# Patient Record
Sex: Female | Born: 1945
Health system: Southern US, Community
[De-identification: ages and names within clinical notes are randomized; demographics above are authoritative.]

## PROBLEM LIST (undated history)

## (undated) DIAGNOSIS — E039 Hypothyroidism, unspecified: Secondary | ICD-10-CM

## (undated) DIAGNOSIS — Z9221 Personal history of antineoplastic chemotherapy: Secondary | ICD-10-CM

## (undated) DIAGNOSIS — M1990 Unspecified osteoarthritis, unspecified site: Secondary | ICD-10-CM

## (undated) DIAGNOSIS — C801 Malignant (primary) neoplasm, unspecified: Secondary | ICD-10-CM

## (undated) DIAGNOSIS — E119 Type 2 diabetes mellitus without complications: Secondary | ICD-10-CM

## (undated) DIAGNOSIS — E1121 Type 2 diabetes mellitus with diabetic nephropathy: Secondary | ICD-10-CM

## (undated) DIAGNOSIS — R011 Cardiac murmur, unspecified: Secondary | ICD-10-CM

## (undated) DIAGNOSIS — J189 Pneumonia, unspecified organism: Secondary | ICD-10-CM

## (undated) DIAGNOSIS — R51 Headache: Secondary | ICD-10-CM

## (undated) DIAGNOSIS — Z923 Personal history of irradiation: Secondary | ICD-10-CM

## (undated) DIAGNOSIS — I10 Essential (primary) hypertension: Secondary | ICD-10-CM

## (undated) DIAGNOSIS — L821 Other seborrheic keratosis: Secondary | ICD-10-CM

## (undated) DIAGNOSIS — E559 Vitamin D deficiency, unspecified: Secondary | ICD-10-CM

## (undated) DIAGNOSIS — R519 Headache, unspecified: Secondary | ICD-10-CM

## (undated) HISTORY — PX: BREAST CYST ASPIRATION: SHX578

## (undated) HISTORY — PX: STENT PLACEMENT ILIAC (ARMC HX): HXRAD1735

## (undated) HISTORY — PX: EYE SURGERY: SHX253

## (undated) HISTORY — PX: CHOLECYSTECTOMY: SHX55

## (undated) HISTORY — PX: TUBAL LIGATION: SHX77

## (undated) HISTORY — PX: APPENDECTOMY: SHX54

---

## 2011-09-18 DIAGNOSIS — K760 Fatty (change of) liver, not elsewhere classified: Secondary | ICD-10-CM | POA: Insufficient documentation

## 2011-12-12 ENCOUNTER — Ambulatory Visit: Payer: Self-pay | Admitting: Internal Medicine

## 2011-12-30 ENCOUNTER — Ambulatory Visit: Payer: Self-pay | Admitting: Internal Medicine

## 2012-06-21 ENCOUNTER — Ambulatory Visit: Payer: Self-pay | Admitting: Internal Medicine

## 2012-07-16 ENCOUNTER — Inpatient Hospital Stay: Payer: Self-pay | Admitting: Internal Medicine

## 2012-07-16 LAB — COMPREHENSIVE METABOLIC PANEL
Albumin: 4.1 g/dL (ref 3.4–5.0)
Anion Gap: 8 (ref 7–16)
BUN: 11 mg/dL (ref 7–18)
Bilirubin,Total: 0.5 mg/dL (ref 0.2–1.0)
Chloride: 108 mmol/L — ABNORMAL HIGH (ref 98–107)
Co2: 23 mmol/L (ref 21–32)
Potassium: 3.8 mmol/L (ref 3.5–5.1)
SGOT(AST): 31 U/L (ref 15–37)
Sodium: 139 mmol/L (ref 136–145)
Total Protein: 7.3 g/dL (ref 6.4–8.2)

## 2012-07-16 LAB — URINALYSIS, COMPLETE
Bilirubin,UR: NEGATIVE
Blood: NEGATIVE
Leukocyte Esterase: NEGATIVE
Nitrite: NEGATIVE
RBC,UR: NONE SEEN /HPF (ref 0–5)
Squamous Epithelial: NONE SEEN
WBC UR: NONE SEEN /HPF (ref 0–5)

## 2012-07-16 LAB — CBC
HCT: 44.2 % (ref 35.0–47.0)
MCH: 31.3 pg (ref 26.0–34.0)
MCHC: 34.2 g/dL (ref 32.0–36.0)
MCV: 91 fL (ref 80–100)
RBC: 4.84 10*6/uL (ref 3.80–5.20)
RDW: 13.1 % (ref 11.5–14.5)
WBC: 6.1 10*3/uL (ref 3.6–11.0)

## 2012-07-16 LAB — TSH: Thyroid Stimulating Horm: 2.14 u[IU]/mL

## 2012-07-17 LAB — BASIC METABOLIC PANEL
Anion Gap: 4 — ABNORMAL LOW (ref 7–16)
Calcium, Total: 8.9 mg/dL (ref 8.5–10.1)
Co2: 29 mmol/L (ref 21–32)
Creatinine: 0.84 mg/dL (ref 0.60–1.30)
EGFR (African American): >60
EGFR (Non-African Amer.): >60
Glucose: 118 mg/dL — ABNORMAL HIGH (ref 65–99)
Sodium: 141 mmol/L (ref 136–145)

## 2012-07-17 LAB — LIPID PANEL
Cholesterol: 123 mg/dL (ref 0–200)
Triglycerides: 112 mg/dL (ref 0–200)
VLDL Cholesterol, Calc: 22 mg/dL (ref 5–40)

## 2013-02-22 ENCOUNTER — Ambulatory Visit: Payer: Self-pay | Admitting: Family Medicine

## 2014-02-27 ENCOUNTER — Ambulatory Visit: Payer: Self-pay | Admitting: Internal Medicine

## 2014-08-11 NOTE — H&P (Signed)
PATIENT NAME:  Stephanie Sanders, Stephanie Sanders MR#:  [id number] DATE OF BIRTH:  [birth year]  DATE OF ADMISSION:  07/16/2012  PRIMARY CARE PROVIDER: Dr. Heber [place].   ED REFERRING PHYSICIAN:  Dr. Michel Santee.  CHIEF COMPLAINT: Difficulty remembering things, has been very emotional and tearful since this morning.   HISTORY OF PRESENT ILLNESS: The patient is a pleasant 69 year old white female with history of diabetes, hypertension, hyperlipidemia, hypothyroidism who reports that she is in usually good health and has been doing well.  This morning, she woke up with a headache which is not uncommon for the patient. She has had a history of having headache in the past, who also when she woke up along with the headache, she could not remember some normal thing is that she normally is able to remember very easily. She tried to log in to her bank account and could not remember the password which she logs on, according to her every day. She also could not remember some of the names of her cousins. She also had other things that she had difficulty remembering.  Then all of a sudden she also started being very tearful and emotional. She was also shaky. She reports that that is not like her; her husband, who is at the bedside reports that she has never done this. There has not been any recent stressors any anxiety. She otherwise reports that she is not having any visual difficulties. No difficulty swallowing. She is not having any weakness or asymmetrical weakness. She is not having any difficulty with ambulation.   PAST MEDICAL HISTORY:  1.  Significant for diabetes type 2. 2.  Has history of borderline hypertension. 3.  Hyperlipidemia. 4.  Hypothyroidism.   PAST SURGICAL HISTORY:  Status post appendectomy, status post cholecystectomy and status post tubal ligation.   ALLERGIES: None but does report that [place] IN THE PAST.  CURRENT MEDICATIONS: Metformin 500 mg b.i.d., atorvastatin 10 daily,  quinapril 5 daily, Synthroid 50 mcg daily, Welchol 625 three tabs 2 times a day with meal.   SOCIAL HISTORY: Does not smoke.  Used to smoke 30 years ago. No alcohol or drug use.   FAMILY HISTORY: Positive for heart disease in her aunt, father with coronary artery disease.    REVIEW OF SYSTEMS:   CONSTITUTIONAL: Denies any fevers, fatigue, weakness. No pain. No weight loss. No weight gain.  EYES: No blurred or double vision. No pain. No redness. No inflammation. No glaucoma. No cataracts.  ENT: No tinnitus. No ear pain. No hearing loss. No difficulty with swelling swallowing.  RESPIRATORY: Denies any cough, wheezing, hemoptysis. No COPD, no tuberculosis.  CARDIOVASCULAR: Denies any chest pain, orthopnea or edema.  GASTROINTESTINAL: No nausea, vomiting, diarrhea. No abdominal pain. No hematemesis.  GENITOURINARY: Denies any dysuria, hematuria, renal calculus or frequency.  ENDOCRINE:  Denies any polyuria, nocturia. Does have hypothyroidism.  HEMATOLOGIC/LYMPHATIC:  Denies anemia, easy bruisability or bleeding.  SKIN: No acne. No rash. No changes in mole, hair or skin.  MUSCULOSKELETAL: Denies any pain in the neck, back or shoulder.  NEUROLOGIC: Complains above symptoms. No history of CVA, TIA, does have headaches.  PSYCHIATRIC: Denies any anxiety, insomnia. No ADD. No OCD.   PHYSICAL EXAMINATION: VITAL SIGNS: Temperature 98.2, pulse 67, respirations 18, blood pressure 154/62, O2 of 97%.  GENERAL: The patient is a well-developed, well-nourished female in no acute distress.  HEENT: Head atraumatic, normocephalic. Pupils equally round, reactive to light and accommodation. There is no conjunctival pallor. No scleral icterus  and nasal exam shows no drainage or ulceration.  Oropharynx clear. NECK: No thyromegaly. No carotid bruits.  CARDIOVASCULAR: She has a systolic murmur heard at the right sternal border. The is PMI is not displaced.  LUNGS: Clear to auscultation bilaterally without any rales,  rhonchi or wheezing.  ABDOMEN: Soft, nontender, nondistended. Positive bowel sounds x 4.  EXTREMITIES: No clubbing, cyanosis or edema.  SKIN: No rash.  LYMPHATICS: No lymph nodes palpable.   VASCULAR: Good DP, PT pulses.  PSYCHIATRIC: Not anxious or depressed.  NEUROLOGIC:  The patient is currently awake, alert, oriented.  Cranial nerves II through XII grossly intact. No focal deficits. Her strength is 5 out of 5, reflexes 2+.  LABORATORY AND DIAGNOSTIC DATA:  CT scan of the head without contrast shows no acute abnormality. WBC 6.1, hemoglobin 15.1, platelet count 170. BMP: Glucose 171, BUN 11, creatinine 0.90, sodium 139, potassium 3.8, chloride 108, CO2 23. LFTs were normal. Troponin less than 0.02. EKG normal sinus rhythm without any ST-T wave changes.   ASSESSMENT AND PLAN: The patient is a 69 year old white female with history of hypertension, diabetes, hyperlipidemia, hypothyroidism, who presents with difficulty with remembering things, also has been very tearful and emotional.  The patient has never had this happen to her before.   1.  Possible cerebrovascular accident involving the thalamus. At this time, will admit the patient, do frequent neuro checks, get MRI of the brain, check carotid Dopplers and echocardiogram.  If work-up is negative, then we will consider psychiatric evaluation.  It is unlikely psychiatric due to acute onset of presentation. The patient has not had any issues with depression or anxiety previously.  2.  Diabetes. We will place her on sliding scale insulin. Continue metformin and check a hemoglobin A1C. 3.  Hypertension. Continue quinapril.  4.  Hyperlipidemia continue Welchol and atorvastatin.  Check a fasting lipid panel in the a.m.  5.  Hypothyroidism. Continue Synthroid. Will check a TSH.  6.  Miscellaneous. Will place her on Lovenox for deep vein thrombosis prophylaxis.   TIME SPENT: 45 minutes spent on the H and  P    ____________________________ Lafonda Mosses. Posey Pronto, MD shp:ct D: 07/16/2012 17:59:03 ET T: 07/16/2012 19:14:07 ET JOB#: [id number]  cc: Briseida Gittings H. Posey Pronto, MD, <Dictator> Alric Seton MD ELECTRONICALLY SIGNED 07/25/2012 19:27

## 2014-08-11 NOTE — Discharge Summary (Signed)
PATIENT NAME:  Stephanie Sanders, MCCOLE MR#:  [id number] DATE OF BIRTH:  [birth year]  DATE OF ADMISSION:  07/16/2012 DATE OF DISCHARGE:  07/18/2012  DISCHARGE DIAGNOSES: 1.  (transient memory problem, likely secondary to insomnia and headache.  2.  Hypertension.  3.  Diabetes mellitus type 2.  4.  Hyperlipidemia.  5.  Migraine headaches.  6.  Hypothyroidism.   DISCHARGE MEDICATIONS:  Synthroid 50 mcg p.o. daily, quinapril 5 mg po daily , atorvastatin 10 mg p.o. daily, WelChol 620 mg 3 tablets 2 times daily, metformin 500 mg p.o. b.i.d., aspirin 81 mg p.o. daily.   DIET: Low-sodium, low-fat diet.   PRIMARY CARE PHYSICIAN: From Dr. Valentino Saxon.   PATIENT'S HOSPITAL COURSE: This is a 69 year old female patient, came in because of difficulty remembering things, and the patient has a history of hypertension, hyperlipidemia, diabetes and hypothyroidism. She was in a good state of health until the day of admission. Suddenly, she could not remember the names of her cousins, and also difficulty remembering bank accounts; checking the bank accounts is something that she does every day, but suddenly she started to forget all of a sudden; and she was also very tearful and emotional when she came. The patient did not have any weakness in hands or legs. No difficulty swallowing, and the patient did not have any speech troubles. No blurred vision. She has headaches, and she says she does get headaches on and off. She was placed on observation to rule out for TIA. Her blood pressure was 154/62 on admission. EKG showed no ST-T changes, and neurological exam was within normal limits on admission. CT of the head did not show any acute changes. Other labs were within normal range. The patient was placed on observation, had a check carotid ultrasound, echocardiogram, and MRI. The patient's MRI of the brain showed no evidence of acute abnormalities, and ultrasound of the carotids showed no sonographic evidence of  hemodynamically significant stenosis. An echocardiogram showed EF of 50% to 55%, with a mildly dilated left atrium, mildly dilated right atrium, and moderate MR and mild to moderate TR.   The patient's LDL is at 55, and triglycerides are at 112. The patient is seen today. She says she did not sleep well. She got some headaches, so the physician on-call started her on tramadol, but she started to have nausea and vomiting this morning because of the tramadol. The patient's said she would like to take her Excedrin Migraine that she takes usually for a headache, and we are going to try the Fioricet and see the if the headache resolves. By afternoon she will be going home, and she really wants to go home.   DISCHARGE VITALS: Temperature 98.7, heart rate 78, blood pressure 119/72, saturations 97% on room air. The patient's condition is stable. Her neurological examination is within normal limits today.  followed pt after she recieved  fiorcet.she said she feels better and felt safe to go home.  TIME SPENT ON DISCHARGE PREPARATION: More than 30 minutes.     ____________________________ Epifanio Lesches, MD sk:dm D: 07/18/2012 09:16:00 ET T: 07/18/2012 12:58:08 ET JOB#: [id number]  cc: Gwenette Greet P. Heber Baudette, MD Epifanio Lesches, MD, <Dictator>   Epifanio Lesches MD ELECTRONICALLY SIGNED 08/04/2012 10:19

## 2014-11-02 ENCOUNTER — Other Ambulatory Visit: Payer: Self-pay | Admitting: Internal Medicine

## 2014-11-02 DIAGNOSIS — E034 Atrophy of thyroid (acquired): Secondary | ICD-10-CM | POA: Insufficient documentation

## 2014-11-02 DIAGNOSIS — Z1231 Encounter for screening mammogram for malignant neoplasm of breast: Secondary | ICD-10-CM

## 2014-12-29 ENCOUNTER — Encounter: Payer: Self-pay | Admitting: *Deleted

## 2015-01-01 ENCOUNTER — Ambulatory Visit: Payer: Medicare Other | Admitting: Anesthesiology

## 2015-01-01 ENCOUNTER — Encounter: Payer: Self-pay | Admitting: Anesthesiology

## 2015-01-01 ENCOUNTER — Encounter
Admission: RE | Disposition: A | Discharge status: Home / Self Care | Payer: Self-pay | Source: Ambulatory Visit | Attending: Gastroenterology

## 2015-01-01 ENCOUNTER — Ambulatory Visit
Admission: RE | Admit: 2015-01-01 | Discharge: 2015-01-01 | Disposition: A | Discharge status: Home / Self Care | Payer: Medicare Other | Source: Ambulatory Visit | Attending: Gastroenterology | Admitting: Gastroenterology

## 2015-01-01 DIAGNOSIS — Z885 Allergy status to narcotic agent status: Secondary | ICD-10-CM | POA: Insufficient documentation

## 2015-01-01 DIAGNOSIS — Z87891 Personal history of nicotine dependence: Secondary | ICD-10-CM | POA: Diagnosis not present

## 2015-01-01 DIAGNOSIS — E1121 Type 2 diabetes mellitus with diabetic nephropathy: Secondary | ICD-10-CM | POA: Insufficient documentation

## 2015-01-01 DIAGNOSIS — Z888 Allergy status to other drugs, medicaments and biological substances status: Secondary | ICD-10-CM | POA: Insufficient documentation

## 2015-01-01 DIAGNOSIS — L821 Other seborrheic keratosis: Secondary | ICD-10-CM | POA: Diagnosis not present

## 2015-01-01 DIAGNOSIS — D123 Benign neoplasm of transverse colon: Secondary | ICD-10-CM | POA: Diagnosis not present

## 2015-01-01 DIAGNOSIS — Z882 Allergy status to sulfonamides status: Secondary | ICD-10-CM | POA: Diagnosis not present

## 2015-01-01 DIAGNOSIS — G43909 Migraine, unspecified, not intractable, without status migrainosus: Secondary | ICD-10-CM | POA: Insufficient documentation

## 2015-01-01 DIAGNOSIS — R011 Cardiac murmur, unspecified: Secondary | ICD-10-CM | POA: Insufficient documentation

## 2015-01-01 DIAGNOSIS — Z1211 Encounter for screening for malignant neoplasm of colon: Secondary | ICD-10-CM | POA: Insufficient documentation

## 2015-01-01 DIAGNOSIS — E039 Hypothyroidism, unspecified: Secondary | ICD-10-CM | POA: Insufficient documentation

## 2015-01-01 DIAGNOSIS — K7460 Unspecified cirrhosis of liver: Secondary | ICD-10-CM | POA: Diagnosis not present

## 2015-01-01 DIAGNOSIS — I10 Essential (primary) hypertension: Secondary | ICD-10-CM | POA: Insufficient documentation

## 2015-01-01 DIAGNOSIS — E559 Vitamin D deficiency, unspecified: Secondary | ICD-10-CM | POA: Insufficient documentation

## 2015-01-01 DIAGNOSIS — K219 Gastro-esophageal reflux disease without esophagitis: Secondary | ICD-10-CM | POA: Insufficient documentation

## 2015-01-01 HISTORY — DX: Headache: R51

## 2015-01-01 HISTORY — DX: Type 2 diabetes mellitus with diabetic nephropathy: E11.21

## 2015-01-01 HISTORY — DX: Hypothyroidism, unspecified: E03.9

## 2015-01-01 HISTORY — DX: Essential (primary) hypertension: I10

## 2015-01-01 HISTORY — DX: Cardiac murmur, unspecified: R01.1

## 2015-01-01 HISTORY — DX: Type 2 diabetes mellitus without complications: E11.9

## 2015-01-01 HISTORY — DX: Vitamin D deficiency, unspecified: E55.9

## 2015-01-01 HISTORY — PX: COLONOSCOPY WITH PROPOFOL: SHX5780

## 2015-01-01 HISTORY — DX: Other seborrheic keratosis: L82.1

## 2015-01-01 HISTORY — DX: Headache, unspecified: R51.9

## 2015-01-01 LAB — GLUCOSE, CAPILLARY: GLUCOSE-CAPILLARY: 172 mg/dL — AB (ref 65–99)

## 2015-01-01 SURGERY — COLONOSCOPY WITH PROPOFOL
Anesthesia: General

## 2015-01-01 MED ORDER — PROPOFOL INFUSION 10 MG/ML OPTIME
INTRAVENOUS | Status: DC | PRN
  Administered 2015-01-01: 120 ug/kg/min via INTRAVENOUS

## 2015-01-01 MED ORDER — LIDOCAINE HCL (CARDIAC) 20 MG/ML IV SOLN
INTRAVENOUS | Status: DC | PRN
  Administered 2015-01-01: 100 mg via INTRAVENOUS

## 2015-01-01 MED ORDER — PROPOFOL 10 MG/ML IV BOLUS
INTRAVENOUS | Status: DC | PRN
  Administered 2015-01-01: 70 mg via INTRAVENOUS

## 2015-01-01 MED ORDER — SODIUM CHLORIDE 0.9 % IV SOLN
INTRAVENOUS | Status: DC
  Administered 2015-01-01: 1000 mL via INTRAVENOUS

## 2015-01-01 MED ORDER — SODIUM CHLORIDE 0.9 % IV SOLN: INTRAVENOUS | Status: DC

## 2015-01-01 NOTE — Brief Op Note (Signed)
IV removed from Right A/c.  Site without redness or swelling.

## 2015-01-01 NOTE — Anesthesia Preprocedure Evaluation (Addendum)
Anesthesia Evaluation  Patient identified by MRN, date of birth, ID band Patient awake    Reviewed: Allergy & Precautions, H&P , NPO status , Patient's Chart, lab work & pertinent test results  History of Anesthesia Complications Negative for: history of anesthetic complications  Airway Mallampati: II  TM Distance: >3 FB Neck ROM: limited    Dental no notable dental hx. (+) Teeth Intact   Pulmonary former smoker,    Pulmonary exam normal breath sounds clear to auscultation       Cardiovascular Exercise Tolerance: Good hypertension, (-) Past MI + Valvular Problems/Murmurs MR  Rhythm:regular Rate:Normal + Diastolic murmurs    Neuro/Psych  Headaches, negative psych ROS   GI/Hepatic negative GI ROS, neg GERD  ,(+) Cirrhosis       ,   Endo/Other  diabetes, Type 2Hypothyroidism   Renal/GU Renal diseasenegative Renal ROS  negative genitourinary   Musculoskeletal   Abdominal   Peds  Hematology negative hematology ROS (+)   Anesthesia Other Findings Past Medical History:   Hypertension                                                 Hypothyroidism                                               Diabetes mellitus without complication                       Heart murmur                                                 Diabetic nephropathy                                         Vitamin D deficiency                                         Seborrheic keratosis                                         Headache                                                       Comment:migraines   Reproductive/Obstetrics negative OB ROS                           Anesthesia Physical Anesthesia Plan  ASA: III  Anesthesia Plan: General   Post-op Pain Management:    Induction:   Airway Management Planned:   Additional Equipment:   Intra-op Plan:   Post-operative  Plan:   Informed Consent: I have reviewed  the patients History and Physical, chart, labs and discussed the procedure including the risks, benefits and alternatives for the proposed anesthesia with the patient or authorized representative who has indicated his/her understanding and acceptance.   Dental Advisory Given  Plan Discussed with: Anesthesiologist, CRNA and Surgeon  Anesthesia Plan Comments:         Anesthesia Quick Evaluation

## 2015-01-01 NOTE — Anesthesia Postprocedure Evaluation (Signed)
  Anesthesia Post-op Note  Patient: Stephanie Sanders  Procedure(s) Performed: Procedure(s): COLONOSCOPY WITH PROPOFOL (N/A)  Anesthesia type:General  Patient location: PACU  Post pain: Pain level controlled  Post assessment: Post-op Vital signs reviewed, Patient's Cardiovascular Status Stable, Respiratory Function Stable, Patent Airway and No signs of Nausea or vomiting  Post vital signs: Reviewed and stable  Last Vitals:  Filed Vitals:   01/01/15 1207  BP: 115/68  Pulse:   Temp:   Resp:     Level of consciousness: awake, alert  and patient cooperative  Complications: No apparent anesthesia complications

## 2015-01-01 NOTE — H&P (Signed)
  Primary Care Physician:  Madelyn Brunner, MD  Pre-Procedure History & Physical: HPI:  Stephanie Sanders is a 69 y.o. female is here for an colonoscopy.   Past Medical History  Diagnosis Date  . Hypertension   . Hypothyroidism   . Diabetes mellitus without complication   . Heart murmur   . Diabetic nephropathy   . Vitamin D deficiency   . Seborrheic keratosis   . Headache     migraines    Past Surgical History  Procedure Laterality Date  . Appendectomy    . Cholecystectomy    . Tubal ligation    . Eye surgery      eyelid  . Stent placement iliac ([facility] hx)      Prior to Admission medications   Medication Sig Start Date End Date Taking? Authorizing Early Ord  atorvastatin (LIPITOR) 10 MG tablet Take 10 mg by mouth daily.   Yes Historical Felita Bump, MD  Cholecalciferol 2000 UNITS CAPS Take by mouth.   Yes Historical Sabrea Sankey, MD  colesevelam (WELCHOL) 625 MG tablet Take by mouth 2 (two) times daily with a meal.   Yes Historical Apoorva Bugay, MD  levothyroxine (SYNTHROID, LEVOTHROID) 50 MCG tablet Take 50 mcg by mouth daily before breakfast.   Yes Historical Jmichael Gille, MD  Melatonin 3 MG TABS Take by mouth.   Yes Historical Majestic Brister, MD  metFORMIN (GLUCOPHAGE) 500 MG tablet Take by mouth 2 (two) times daily with a meal.   Yes Historical Yomaira Solar, MD  quinapril (ACCUPRIL) 5 MG tablet Take 5 mg by mouth at bedtime.   Yes Historical Anmol Fleck, MD    Allergies as of 12/11/2014  . (Not on File)    History reviewed. No pertinent family history.  Social History   Social History  . Marital Status: Married    Spouse Name: N/A  . Number of Children: N/A  . Years of Education: N/A   Occupational History  . Not on file.   Social History Main Topics  . Smoking status: Former Research scientist (life sciences)  . Smokeless tobacco: Not on file  . Alcohol Use: Not on file  . Drug Use: Not on file  . Sexual Activity: Not on file   Other Topics Concern  . Not on file   Social History Narrative      Physical Exam: BP 141/65 mmHg  Pulse 79  Temp(Src) 99.5 F (37.5 C) (Tympanic)  Resp 19  Ht 5\' 8"  (1.727 m)  Wt 72.576 kg (160 lb)  BMI 24.33 kg/m2  SpO2 100% General:   Alert,  pleasant and cooperative in NAD Head:  Normocephalic and atraumatic. Neck:  Supple; no masses or thyromegaly. Lungs:  Clear throughout to auscultation.    Heart:  Regular rate and rhythm. Abdomen:  Soft, nontender and nondistended. Normal bowel sounds, without guarding, and without rebound.   Neurologic:  Alert and  oriented x4;  grossly normal neurologically.  Impression/Plan: [name] is here for an colonoscopy to be performed for screening  Risks, benefits, limitations, and alternatives regarding  colonoscopy have been reviewed with the patient.  Questions have been answered.  All parties agreeable.   Josefine Class, MD  01/01/2015, 10:50 AM

## 2015-01-01 NOTE — Op Note (Signed)
[facility] Patient Name: Stephanie Sanders Procedure Date: 01/01/2015 10:43 AM MRN: [id number] Account #: [id number] Date of Birth: 1945/09/19 Admit Type: Outpatient Age: 69 Room: [facility] ENDO ROOM 2 Gender: Female Note Status: Finalized Procedure:         Colonoscopy Indications:       Screening for colorectal malignant neoplasm, Last                     colonoscopy 10 years ago Patient Profile:   This is a 69 year old female. Providers:         Gerrit Heck. Rayann Heman, MD Referring MD:      Hewitt Blade. Sarina Ser, MD (Referring MD) Medicines:         Propofol per Anesthesia Complications:     No immediate complications. Procedure:         Pre-Anesthesia Assessment:                    - Prior to the procedure, a History and Physical was                     performed, and patient medications, allergies and                     sensitivities were reviewed. The patient's tolerance of                     previous anesthesia was reviewed.                    After obtaining informed consent, the colonoscope was                     passed under direct vision. Throughout the procedure, the                     patient's blood pressure, pulse, and oxygen saturations                     were monitored continuously. The Colonoscope was                     introduced through the anus and advanced to the the cecum,                     identified by appendiceal orifice and ileocecal valve. The                     colonoscopy was performed without difficulty. The patient                     tolerated the procedure well. The quality of the bowel                     preparation was excellent. Findings:      The perianal and digital rectal examinations were normal.      A 5 mm polyp was found in the cecum. The polyp was flat. The polyp was       removed with a saline injection-lift technique using a hot snare.       Resection and retrieval were complete.      A 5 mm polyp was found  at the hepatic flexure. The polyp was flat. The       polyp was removed with a hot snare.  Resection and retrieval were       complete.      The exam was otherwise without abnormality on direct and retroflexion       views. Impression:        - One 5 mm polyp in the cecum. Resected and retrieved.                    - One 5 mm polyp at the hepatic flexure. Resected and                     retrieved.                    - The examination was otherwise normal on direct and                     retroflexion views. Recommendation:    - Observe patient in GI recovery unit.                    - High fiber diet.                    - Continue present medications.                    - Await pathology results.                    - Repeat colonoscopy for surveillance based on pathology                     results.                    - Return to referring physician.                    - The findings and recommendations were discussed with the                     patient.                    - The findings and recommendations were discussed with the                     patient's family. Procedure Code(s): --- Professional ---                    [id number], 88, Colonoscopy, flexible; with endoscopic mucosal                     resection                    [phone], Colonoscopy, flexible; with removal of tumor(s),                     polyp(s), or other lesion(s) by snare technique CPT copyright 2014 American Medical Association. All rights reserved. The codes documented in this report are preliminary and upon coder review may  be revised to meet current compliance requirements. Mellody Life, MD 01/01/2015 11:36:00 AM This report has been signed electronically. Number of Addenda: 0 Note Initiated On: 01/01/2015 10:43 AM Scope Withdrawal Time: 0 hours 23 minutes 40 seconds  Total Procedure Duration: 0 hours 32 minutes 41 seconds       [facility]

## 2015-01-01 NOTE — Transfer of Care (Signed)
Immediate Anesthesia Transfer of Care Note  Patient: Stephanie Sanders  Procedure(s) Performed: Procedure(s): COLONOSCOPY WITH PROPOFOL (N/A)  Patient Location: Endoscopy Unit  Anesthesia Type:General  Level of Consciousness: sedated  Airway & Oxygen Therapy: Patient Spontanous Breathing and Patient connected to nasal cannula oxygen  Post-op Assessment: Report given to RN and Post -op Vital signs reviewed and stable  Post vital signs: Reviewed and stable  Last Vitals:  Filed Vitals:   01/01/15 1031  BP: 141/65  Pulse: 79  Temp: 37.5 C  Resp: 19    Complications: No apparent anesthesia complications

## 2015-01-01 NOTE — Discharge Instructions (Signed)

## 2015-01-02 ENCOUNTER — Encounter: Payer: Self-pay | Admitting: Gastroenterology

## 2015-01-02 LAB — SURGICAL PATHOLOGY

## 2015-03-09 ENCOUNTER — Ambulatory Visit
Admission: RE | Admit: 2015-03-09 | Discharge: 2015-03-09 | Disposition: A | Discharge status: Home / Self Care | Payer: Medicare Other | Source: Ambulatory Visit | Attending: Internal Medicine | Admitting: Internal Medicine

## 2015-03-09 ENCOUNTER — Other Ambulatory Visit: Payer: Self-pay | Admitting: Internal Medicine

## 2015-03-09 DIAGNOSIS — Z1231 Encounter for screening mammogram for malignant neoplasm of breast: Secondary | ICD-10-CM | POA: Diagnosis present

## 2015-05-17 DIAGNOSIS — E118 Type 2 diabetes mellitus with unspecified complications: Secondary | ICD-10-CM | POA: Insufficient documentation

## 2016-01-30 ENCOUNTER — Other Ambulatory Visit: Payer: Self-pay | Admitting: Internal Medicine

## 2016-01-30 DIAGNOSIS — Z1231 Encounter for screening mammogram for malignant neoplasm of breast: Secondary | ICD-10-CM

## 2016-03-10 ENCOUNTER — Ambulatory Visit
Admission: RE | Admit: 2016-03-10 | Discharge: 2016-03-10 | Disposition: A | Discharge status: Home / Self Care | Payer: Medicare Other | Source: Ambulatory Visit | Attending: Internal Medicine | Admitting: Internal Medicine

## 2016-03-10 DIAGNOSIS — Z1231 Encounter for screening mammogram for malignant neoplasm of breast: Secondary | ICD-10-CM | POA: Diagnosis present

## 2017-04-09 ENCOUNTER — Other Ambulatory Visit: Payer: Self-pay | Admitting: Internal Medicine

## 2017-04-09 DIAGNOSIS — Z1239 Encounter for other screening for malignant neoplasm of breast: Secondary | ICD-10-CM

## 2017-04-21 DIAGNOSIS — Z9221 Personal history of antineoplastic chemotherapy: Secondary | ICD-10-CM

## 2017-04-21 DIAGNOSIS — C801 Malignant (primary) neoplasm, unspecified: Secondary | ICD-10-CM

## 2017-04-21 HISTORY — DX: Malignant (primary) neoplasm, unspecified: C80.1

## 2017-04-21 HISTORY — DX: Personal history of antineoplastic chemotherapy: Z92.21

## 2017-04-27 ENCOUNTER — Other Ambulatory Visit: Payer: Self-pay | Admitting: Internal Medicine

## 2017-04-27 DIAGNOSIS — Z1231 Encounter for screening mammogram for malignant neoplasm of breast: Secondary | ICD-10-CM

## 2017-05-13 ENCOUNTER — Ambulatory Visit
Admission: RE | Admit: 2017-05-13 | Discharge: 2017-05-13 | Disposition: A | Discharge status: Home / Self Care | Payer: Medicare Other | Source: Ambulatory Visit | Attending: Internal Medicine | Admitting: Internal Medicine

## 2017-05-13 DIAGNOSIS — Z1231 Encounter for screening mammogram for malignant neoplasm of breast: Secondary | ICD-10-CM | POA: Diagnosis not present

## 2017-05-15 ENCOUNTER — Other Ambulatory Visit: Payer: Self-pay | Admitting: Internal Medicine

## 2017-05-15 DIAGNOSIS — R928 Other abnormal and inconclusive findings on diagnostic imaging of breast: Secondary | ICD-10-CM

## 2017-05-21 ENCOUNTER — Ambulatory Visit
Admission: RE | Admit: 2017-05-21 | Discharge: 2017-05-21 | Disposition: A | Discharge status: Home / Self Care | Payer: Medicare Other | Source: Ambulatory Visit | Attending: Internal Medicine | Admitting: Internal Medicine

## 2017-05-21 DIAGNOSIS — R928 Other abnormal and inconclusive findings on diagnostic imaging of breast: Secondary | ICD-10-CM

## 2017-05-21 DIAGNOSIS — N6311 Unspecified lump in the right breast, upper outer quadrant: Secondary | ICD-10-CM | POA: Diagnosis not present

## 2017-05-21 DIAGNOSIS — N6489 Other specified disorders of breast: Secondary | ICD-10-CM | POA: Diagnosis present

## 2017-05-21 DIAGNOSIS — I898 Other specified noninfective disorders of lymphatic vessels and lymph nodes: Secondary | ICD-10-CM | POA: Diagnosis not present

## 2017-05-25 ENCOUNTER — Other Ambulatory Visit: Payer: Self-pay | Admitting: Internal Medicine

## 2017-05-25 DIAGNOSIS — R928 Other abnormal and inconclusive findings on diagnostic imaging of breast: Secondary | ICD-10-CM

## 2017-05-25 DIAGNOSIS — N6310 Unspecified lump in the right breast, unspecified quadrant: Secondary | ICD-10-CM

## 2017-05-28 ENCOUNTER — Ambulatory Visit
Admission: RE | Admit: 2017-05-28 | Discharge: 2017-05-28 | Disposition: A | Discharge status: Home / Self Care | Payer: Medicare Other | Source: Ambulatory Visit | Attending: Internal Medicine | Admitting: Internal Medicine

## 2017-05-28 DIAGNOSIS — N6310 Unspecified lump in the right breast, unspecified quadrant: Secondary | ICD-10-CM

## 2017-05-28 DIAGNOSIS — C50411 Malignant neoplasm of upper-outer quadrant of right female breast: Secondary | ICD-10-CM | POA: Diagnosis not present

## 2017-05-28 DIAGNOSIS — R928 Other abnormal and inconclusive findings on diagnostic imaging of breast: Secondary | ICD-10-CM

## 2017-05-28 DIAGNOSIS — C773 Secondary and unspecified malignant neoplasm of axilla and upper limb lymph nodes: Secondary | ICD-10-CM | POA: Diagnosis not present

## 2017-05-28 HISTORY — PX: BREAST BIOPSY: SHX20

## 2017-05-29 ENCOUNTER — Other Ambulatory Visit: Payer: Self-pay | Admitting: Pathology

## 2017-05-29 ENCOUNTER — Other Ambulatory Visit (HOSPITAL_COMMUNITY): Payer: Self-pay | Admitting: Diagnostic Radiology

## 2017-05-29 ENCOUNTER — Other Ambulatory Visit: Payer: Self-pay | Admitting: Internal Medicine

## 2017-05-29 DIAGNOSIS — Z9889 Other specified postprocedural states: Secondary | ICD-10-CM

## 2017-06-01 ENCOUNTER — Encounter: Payer: Self-pay | Admitting: *Deleted

## 2017-06-01 NOTE — Progress Notes (Signed)
  Oncology Nurse Navigator Documentation  Navigator Location: [facility] (06/01/17 1500) Referral date to RadOnc/MedOnc: 06/04/17 (06/01/17 1500) )Navigator Encounter Type: Introductory phone call (06/01/17 1500)   Abnormal Finding Date: 05/21/17 (06/01/17 1500) Confirmed Diagnosis Date: 05/29/17 (06/01/17 1500)                 Treatment Phase: Pre-Tx/Tx Discussion (06/01/17 1500) Barriers/Navigation Needs: Coordination of Care (06/01/17 1500)   Interventions: Coordination of Care (06/01/17 1500)   Coordination of Care: Appts (06/01/17 1500)                  Time Spent with Patient: 45 (06/01/17 1500)   Called patient to establish navigation services.  She is newly diagnosed with invasive breast cancer with a positive lymph node.  She has been scheduled to see Dr. Peyton Najjar per her primary care provider preference on 06/02/17 @ 1:30 and Dr. Mike Gip on 04/03/18 @ 3:00.  She understands I will take her educational literature to Dr. Deniece Ree office for her to pick up, and I will meet her on Thursday at her appointment in the cancer center.  She is to call if she has any questions or needs.

## 2017-06-02 ENCOUNTER — Ambulatory Visit: Payer: Self-pay | Admitting: General Surgery

## 2017-06-02 NOTE — H&P (Signed)
PATIENT PROFILE: Stephanie Sanders is a 72 y.o. female who presents to the Clinic for consultation at the request of Stephanie Sanders for evaluation of breast cancer of the right breast.  PCP:  Stephanie Ochs, MD  HISTORY OF PRESENT ILLNESS: Stephanie Sanders reports having her routine screening mammography were it was found to have a suspicious lesion. Then, patient has a diagnostic mammography and a biopsy of the lesions and it was found with invasive mammary cancer. On the images it was found to have an enlarged lymph node and it was found to have malignant cells from breast cancer. Patient denies changes on her breast, no palpable masses, no palpable lymph nodes, no skin changes, no nipple retraction or drainage.  Patient refers menarche at 22 years and last menstrual cycle at 72 years old.  No family history diagnosed of breast or ovarian cancer No radiation to chest Patient had estrogen therapy for less than a year Used birth control pill First pregnancy at 72 years old (3 pregnancies in total)   PROBLEM LIST:         Problem List  Date Reviewed: 04/09/2017         Noted   Type 2 diabetes mellitus with complication, without long-term current use of insulin , unspecified (CMS-HCC) 05/17/2015   Hypothyroidism due to acquired atrophy of thyroid 11/02/2014   Transient memory loss 07/23/2012   Overview    S/p admission to [facility] 07/2012, neg carotid doppler, neg brain MRI, ECHo-normal EF, mod MR, mild to mod TR, mild dilation of left and right atrium, normal TSH      Abdominal pain, unspecified 12/09/2011   Fatty liver 09/18/2011   Overview    Negative hep A,B,C 11/2011 Abd [place] confirms fatty liver 11/2011      Hyperlipidemia, unspecified Unknown   Overview                02/2011            08/2011             LDL      91                        139 trigs     188                        162      Hypertension Unknown   Vitamin D deficiency, unspecified Unknown    Overview                            08/2011 Vit D                43.9      Seborrheic keratosis Unknown      GENERAL REVIEW OF SYSTEMS:   General ROS: negative for - chills, fatigue, fever, weight gain or weight loss Allergy and Immunology ROS: negative for - hives  Hematological and Lymphatic ROS: negative for - bleeding problems or bruising, negative for palpable nodes Endocrine ROS: negative for - heat or cold intolerance, hair changes Respiratory ROS: negative for - cough, shortness of breath or wheezing Cardiovascular ROS: no chest pain. Positive for palpitations GI ROS: negative for nausea, vomiting, abdominal pain, diarrhea, constipation Musculoskeletal ROS: negative for - joint swelling or muscle pain Neurological ROS: negative for - confusion, syncope. Positive for headaches.  Dermatological ROS: negative for pruritus  and rash Psychiatric: negative for anxiety, depression, difficulty sleeping and memory loss  MEDICATIONS: CurrentMedications        Current Outpatient Medications  Medication Sig Dispense Refill  . aspirin 81 MG chewable tablet Take 81 mg by mouth once daily.    Marland Kitchen atorvastatin (LIPITOR) 10 MG tablet take 1 tablet by mouth once daily 30 tablet 5  . blood glucose diagnostic (ONETOUCH ULTRA TEST) test strip Use 1 each once daily. Use as instructed. 100 each 3  . blood glucose meter kit kit One Touch Ultra Meter Use as instructed. 1 each 0  . cholecalciferol (VITAMIN D3) 2,000 unit capsule Take 4,000 Units by mouth daily.     . colesevelam (WELCHOL) 625 mg tablet take 3 tablets by mouth once daily 90 tablet 3  . melatonin 3 mg tablet Take 3 mg by mouth nightly.    . metFORMIN (GLUCOPHAGE) 500 MG tablet take 2 tablets by mouth twice a day with meals 120 tablet 3  . nystatin (MYCOSTATIN) 100,000 unit/gram cream Apply topically 2 (two) times daily 15 g 2  . quinapril (ACCUPRIL) 5 MG tablet take 1 tablet by mouth once daily 30 tablet 5  . SYNTHROID  50 mcg tablet take 1 tablet by mouth every morning ON AN EMPTY STOMACH 30 tablet 5   No current facility-administered medications for this visit.       ALLERGIES: Fiorinal [butalbital-aspirin-caffeine]; Septra [sulfamethoxazole-trimethoprim]; Sulfa (sulfonamide antibiotics); and Tramadol  PAST MEDICAL HISTORY:     Past Medical History:  Diagnosis Date  . Diabetic nephropathy (CMS-HCC) 1999  . Heart murmur, unspecified 07/2012   normal EF, mod MR, mild to mod TR, mild dilation of left and right atrium  . History of migraine   . Hypertension   . Seborrheic keratosis   . Serrated adenoma of colon, unspecified 01/01/2015  . Thyroid disease   . Vitamin D deficiency, unspecified     PAST SURGICAL HISTORY:      Past Surgical History:  Procedure Laterality Date  . APPENDECTOMY  1980  . CHOLECYSTECTOMY  2011   stent placed due to bile leak  . COLONOSCOPY  01/01/2015   Serrated adenoma of colon/Repeat 36yr/MGR  . EYELID SURGERY  2006  . TUBAL LIGATION       FAMILY HISTORY:      Family History  Problem Relation Age of Onset  . Coronary Artery Disease (Blocked arteries around heart) Father 55       tobacco use, poor diet  . Lung cancer Father   . Pancreatic cancer Father   . Ovarian cancer Maternal Aunt 564 . Diabetes type II Mother   . Parkinsonism Mother   . Thyroid disease Mother   . Diabetes type II Brother   . Stroke Paternal Aunt   . Diabetes type II Paternal Grandmother   . Stroke Paternal Grandfather   . Lung cancer Maternal Aunt   . Breast cancer Neg Hx   . Osteoporosis (Thinning of bones) Neg Hx      SOCIAL HISTORY: Social History        Socioeconomic History  . Marital status: Married    Spouse name: WCletus Sanders . Number of children: 3  . Years of education: 172 . Highest education level: Not on file  Occupational History  . Occupation: Retired-[employer] job  Social Needs  . Financial resource strain: Not  on file  . Food insecurity:    Worry: Not on file    Inability: Not on file  .  Transportation needs:    Medical: Not on file    Non-medical: Not on file  Tobacco Use  . Smoking status: Former Smoker    Packs/day: 2.00    Years: 10.00    Pack years: 20.00    Types: Cigarettes    Last attempt to quit: 04/22/1975    Years since quitting: 42.1  . Smokeless tobacco: Never Used  . Tobacco comment: stopped 36 years ago  Substance and Sexual Activity  . Alcohol use: No    Alcohol/week: 0.0 oz    Comment: stopped 1996  . Drug use: No  . Sexual activity: Yes    Partners: Male  Lifestyle  . Physical activity:    Days per week: Not on file    Minutes per session: Not on file  . Stress: Not on file  Relationships  . Social connections:    Talks on phone: Not on file    Gets together: Not on file    Attends religious service: Not on file    Active member of club or organization: Not on file    Attends meetings of clubs or organizations: Not on file    Relationship status: Not on file  . Intimate partner violence:    Fear of current or ex partner: Not on file    Emotionally abused: Not on file    Physically abused: Not on file    Forced sexual activity: Not on file  Other Topics Concern  . Not on file  Social History Narrative  . Not on file    PHYSICAL EXAM: Body mass index is 24.41 kg/m. Weight: 71.2 kg (157 lb)     Vitals:   06/02/17 1343  BP: 146/77  Pulse: 76  Pain score 0  GENERAL: Alert, active, oriented x3  HEENT: Pupils equal reactive to light. Extraocular movements are intact. Sclera clear. Palpebral conjunctiva normal red color.Pharynx clear.  NECK: Supple with no palpable mass and no adenopathy.  LUNGS: Sound clear with no rales rhonchi or wheezes.  HEART: Regular rhythm S1 and S2 without murmur.  BREAST: bilateral breast examination done with patient on supine and sitting position. Both breast  palpated and no masses were identified. echymosis was appreciated on the right breast outer region from previous biopsy. No axillary node palpated. No nipple retraction, no nipple discharge.   ABDOMEN: Soft and depressible, nontender with no palpable mass, no hepatomegaly.  EXTREMITIES: Well-developed well-nourished symmetrical with no dependent edema.  NEUROLOGICAL: Awake alert oriented, facial expression symmetrical, moving all extremities.  REVIEW OF DATA: I have reviewed the following data today:      Appointment on 04/02/2017  Component Date Value  . Glucose 04/02/2017 139*  . Sodium 04/02/2017 140   . Potassium 04/02/2017 4.1   . Chloride 04/02/2017 101   . Carbon Dioxide (CO2) 04/02/2017 31.6   . Calcium 04/02/2017 9.7   . Urea Nitrogen (BUN) 04/02/2017 12   . Creatinine 04/02/2017 0.8   . Glomerular Filtration Ra* 04/02/2017 71   . BUN/Crea Ratio 04/02/2017 15.0   . Anion Gap Stephanie/K 04/02/2017 11.5   . Hemoglobin A1C 04/02/2017 6.4*  . Average Blood Glucose (C* 04/02/2017 137   Appointment on 03/05/2017  Component Date Value  . Color 03/05/2017 Yellow   . Clarity 03/05/2017 Clear   . Specific Gravity 03/05/2017 1.025   . pH, Urine 03/05/2017 5.0   . Protein, Urinalysis 03/05/2017 Negative   . Glucose, Urinalysis 03/05/2017 Negative   . Ketones, Urinalysis 03/05/2017 Trace*  .  Blood, Urinalysis 03/05/2017 Negative   . Nitrite, Urinalysis 03/05/2017 Negative   . Leukocyte Esterase, Urin* 03/05/2017 Negative   . White Blood Cells, Urina* 03/05/2017 0-3   . Red Blood Cells, Urinaly* 03/05/2017 0-3   . Bacteria, Urinalysis 03/05/2017 Few*  . Squamous Epithelial Cell* 03/05/2017 Few   . Hyaline Casts, Urinalysis 03/05/2017 5   . Urine Culture, Routine -* 03/05/2017 Final report   . Result 1 - [facility] 03/05/2017 No growth      ASSESSMENT: Ms. Marcon is a 72 y.o. female presenting for consultation for right breast cancer. Patient found with an invasive mammary  carcinoma of the right breast. Suspected 2 lesions one next to the other with a span of 2.4cm. The invasive carcinoma is grade 2 ER/PR negative, Her2 unknown. Patient also with a positive right axillary lymph node for metastasis found on imaging, not palpated clinically.   With this finding patient was oriented about the pathology report, the diagnosis of breast cancer and the surgical alternatives. Patient was oriented about the procedure of Partial vs Total Mastectomy and Sentinel lymph node biopsy vs Axillary node dissection. Patient agreed to have partial mastectomy as the surgical alternative. The axillary procedure was discussed. Patient with indication of ALND but will like to avoid that procedure. I offered the patient the alternative of doing a Sentinel Lymph node biopsy and if the other nodes comes negative, the complete ALND may not be needed. Will discuss with oncologist of the scenario if the SLNB or other nodes comes negative if we will proceed with ALND. This discussion will continue with the Oncologist.   I spent > 60 minutes on this encounter and more than 50% was orienting, discussing and coordinating plan of care.   PLAN: 1. Partial Mastectomy (needle guided) with sentinel lymph node biopsy (24818) vs axillary lymph node dissection 19302 2. CBC, CMP, EKG 3. Internal Medicine Clearance 4. Do not take aspirin 5 days prior to surgery  Patient and and her friend verbalized understanding, all questions were answered, and were agreeable with the plan outlined above.    Herbert Pun, MD  Electronically signed by Herbert Pun, MD

## 2017-06-03 ENCOUNTER — Other Ambulatory Visit: Payer: Self-pay | Admitting: General Surgery

## 2017-06-03 DIAGNOSIS — Z171 Estrogen receptor negative status [ER-]: Secondary | ICD-10-CM

## 2017-06-03 DIAGNOSIS — C50411 Malignant neoplasm of upper-outer quadrant of right female breast: Secondary | ICD-10-CM

## 2017-06-03 DIAGNOSIS — C50011 Malignant neoplasm of nipple and areola, right female breast: Secondary | ICD-10-CM

## 2017-06-04 ENCOUNTER — Encounter: Payer: Self-pay | Admitting: Hematology and Oncology

## 2017-06-04 ENCOUNTER — Inpatient Hospital Stay: Payer: Medicare Other | Attending: Hematology and Oncology | Admitting: Hematology and Oncology

## 2017-06-04 ENCOUNTER — Other Ambulatory Visit: Payer: Self-pay

## 2017-06-04 ENCOUNTER — Inpatient Hospital Stay: Payer: Medicare Other

## 2017-06-04 ENCOUNTER — Other Ambulatory Visit: Payer: Self-pay | Admitting: General Surgery

## 2017-06-04 ENCOUNTER — Encounter: Payer: Self-pay | Admitting: *Deleted

## 2017-06-04 VITALS — BP 145/82 | HR 89 | Temp 99.0°F | Resp 18 | Ht 68.0 in | Wt 153.2 lb

## 2017-06-04 DIAGNOSIS — C773 Secondary and unspecified malignant neoplasm of axilla and upper limb lymph nodes: Principal | ICD-10-CM

## 2017-06-04 DIAGNOSIS — C50411 Malignant neoplasm of upper-outer quadrant of right female breast: Secondary | ICD-10-CM

## 2017-06-04 DIAGNOSIS — Z171 Estrogen receptor negative status [ER-]: Principal | ICD-10-CM

## 2017-06-04 DIAGNOSIS — Z8041 Family history of malignant neoplasm of ovary: Secondary | ICD-10-CM | POA: Diagnosis not present

## 2017-06-04 DIAGNOSIS — Z800 Family history of malignant neoplasm of digestive organs: Secondary | ICD-10-CM | POA: Diagnosis not present

## 2017-06-04 DIAGNOSIS — Z801 Family history of malignant neoplasm of trachea, bronchus and lung: Secondary | ICD-10-CM | POA: Diagnosis not present

## 2017-06-04 DIAGNOSIS — C50211 Malignant neoplasm of upper-inner quadrant of right female breast: Secondary | ICD-10-CM

## 2017-06-04 DIAGNOSIS — Z006 Encounter for examination for normal comparison and control in clinical research program: Secondary | ICD-10-CM | POA: Insufficient documentation

## 2017-06-04 DIAGNOSIS — C50911 Malignant neoplasm of unspecified site of right female breast: Secondary | ICD-10-CM | POA: Insufficient documentation

## 2017-06-04 DIAGNOSIS — C50811 Malignant neoplasm of overlapping sites of right female breast: Secondary | ICD-10-CM | POA: Insufficient documentation

## 2017-06-04 LAB — CBC WITH DIFFERENTIAL/PLATELET
Basophils Absolute: 0 10*3/uL (ref 0–0.1)
Basophils Relative: 1 %
Eosinophils Absolute: 0.1 10*3/uL (ref 0–0.7)
Eosinophils Relative: 3 %
HCT: 43.8 % (ref 35.0–47.0)
Hemoglobin: 14.7 g/dL (ref 12.0–16.0)
Lymphocytes Relative: 23 %
Lymphs Abs: 1.3 10*3/uL (ref 1.0–3.6)
MCH: 30.4 pg (ref 26.0–34.0)
MCHC: 33.7 g/dL (ref 32.0–36.0)
MCV: 90.4 fL (ref 80.0–100.0)
Monocytes Absolute: 0.5 10*3/uL (ref 0.2–0.9)
Monocytes Relative: 9 %
Neutro Abs: 3.7 10*3/uL (ref 1.4–6.5)
Neutrophils Relative %: 64 %
Platelets: 209 10*3/uL (ref 150–440)
RBC: 4.84 MIL/uL (ref 3.80–5.20)
RDW: 13.6 % (ref 11.5–14.5)
WBC: 5.8 10*3/uL (ref 3.6–11.0)

## 2017-06-04 LAB — COMPREHENSIVE METABOLIC PANEL
ALT: 25 U/L (ref 14–54)
AST: 26 U/L (ref 15–41)
Albumin: 4.6 g/dL (ref 3.5–5.0)
Alkaline Phosphatase: 50 U/L (ref 38–126)
Anion gap: 10 (ref 5–15)
BUN: 12 mg/dL (ref 6–20)
CO2: 28 mmol/L (ref 22–32)
Calcium: 9.8 mg/dL (ref 8.9–10.3)
Chloride: 103 mmol/L (ref 101–111)
Creatinine, Ser: 0.88 mg/dL (ref 0.44–1.00)
GFR calc Af Amer: >60 mL/min (ref >60)
GFR calc non Af Amer: >60 mL/min (ref >60)
Glucose, Bld: 144 mg/dL — ABNORMAL HIGH (ref 65–99)
Potassium: 3.8 mmol/L (ref 3.5–5.1)
Sodium: 141 mmol/L (ref 135–145)
Total Bilirubin: 0.9 mg/dL (ref 0.3–1.2)
Total Protein: 7.5 g/dL (ref 6.5–8.1)

## 2017-06-04 NOTE — Progress Notes (Signed)
  Oncology Nurse Navigator Documentation  Navigator Location: [facility] (06/04/17 1700)   )Navigator Encounter Type: Initial MedOnc (06/04/17 1700)                       Treatment Phase: Pre-Tx/Tx Discussion (06/04/17 1700) Barriers/Navigation Needs: Education (06/04/17 1700) Education: Newly Diagnosed Cancer Education (06/04/17 1700)                        Time Spent with Patient: > 120 (06/04/17 1700)   Met with patient and her friend, a retired Therapist, sports, during her initial medical oncology consult with Dr. Mike Gip.  She has recommended neoadjuvant chemotherapy, and genetic testing.  Patient with a family history of a maternal aunt with ovarian cancer and her dad with pancreatic cancer.  2 other aunts with a lung, and stomach cancer.  Gave patient breast cancer educational literature, "My Breast Cancer Treatment Handbook" by Josephine Igo, RN.  Patient is to call if she has any questions or needs.

## 2017-06-04 NOTE — Progress Notes (Signed)
Patient here today as new evaluation regarding breast cancer with a positive lymph nodes.  Referred by Dr. Meade Maw.

## 2017-06-04 NOTE — Progress Notes (Signed)
[facility] day:  06/04/2017  Chief Complaint: Stephanie Sanders is a 72 y.o. female with right breast cancer who is referred in consultation by Dr. Harrel Lemon for assessment and management.  HPI:  The patient undergoes yearly mammograms.  Bilateral mammogram on 05/14/2017 revealed distortion in the right breast.  There were no suspicious findings in the left breast.  Right sided mammogram and ultrasound on 05/21/2017 revealed 2 highly suspicious masses in the right breast at the 10:30 position 4 cm from nipple (1.8 x 1.2 x 1.1 cm) and at the 10:30 position 5 cm from nipple (1.1 x 0.9 x 0.9 cm).  There was a suspicious 2.7 x 1 x 2.2 cm lymph node in the right axilla with asymmetric nodular cortical thickening.  Ultrasound guided biopsy of the 2 masses and lymph node on 05/28/2017 revealed grade II invasive mammary carcinoma of no special type (4 cm from the nipple and 5 cm from the nipple).  Lymph node biopsy revealed metastatic carcinoma of breast origin.  Tumor is ER negative (< 1%), PR negative and Her 2/neu 2+ by IHC.  She was seen by Dr. Herbert Pun on 06/02/2017.  She is scheduled to undergo partial mastectomy with sentinel lymph node biopsy on 06/10/2017.  Symptomatically, she feels "ok".  Her weight has decreased 1-2 pounds secondary to stress.    She underwent menses at age 19 and menopause at age 61.  Her first pregnancy was at age 30.  She breast fed her son.  She was on on and off birth control pills for 10 years (age [age]).  She was on hormone replacement therapy for "not very  long".     She denies any family history of breast cancer.  Her maternal aunt had ovarian cancer in her early 23s or 64s.  Two other maternal aunts had cancer (gastric and unknown).  Her father had pancreatic cancer and lung cancer (heavy smoker).    Past Medical History:  Diagnosis Date  . Diabetes mellitus without complication ([place])   . Diabetic  nephropathy ([place])   . Headache    migraines  . Heart murmur   . Hypertension   . Hypothyroidism   . Seborrheic keratosis   . Vitamin D deficiency     Past Surgical History:  Procedure Laterality Date  . APPENDECTOMY    . BREAST BIOPSY Right 05/28/2017   right breast bx 3 areas invasive mamm ca lymph node mets  . BREAST CYST ASPIRATION Bilateral    neg  . CHOLECYSTECTOMY    . COLONOSCOPY WITH PROPOFOL N/A 01/01/2015   Procedure: COLONOSCOPY WITH PROPOFOL;  Surgeon: Josefine Class, MD;  Location: [facility] ENDOSCOPY;  Service: Endoscopy;  Laterality: N/A;  . EYE SURGERY     eyelid  . STENT PLACEMENT ILIAC ([place] HX)    . TUBAL LIGATION      Family History  Problem Relation Age of Onset  . Cancer Mother   . Cancer Maternal Aunt   . Breast cancer Neg Hx     Social History:  reports that she quit smoking about 45 years ago. She has a 20.00 pack-year smoking history. She does not have any smokeless tobacco history on file. She reports that she does not drink alcohol or use drugs.  She has 3 children (daughters: age 48 and 29; son: age 42).  She is retired.  She worked for the [employer].  Her husband has dementia.  She lives in [place].  The patient is accompanied by her friend, Perrin Smack, who is an Therapist, sports today.  Allergies:  Allergies  Allergen Reactions  . Fiorinal [Butalbital-Aspirin-Caffeine] Nausea And Vomiting and Other (See Comments)    SEVERE HEADACHE  . Sulfa Antibiotics Other (See Comments)    Burning esophagus and stomach  . Tramadol Nausea And Vomiting    Current Medications: Current Outpatient Medications  Medication Sig Dispense Refill  . acetaminophen (TYLENOL) 500 MG tablet Take 1,000 mg by mouth every 6 (six) hours as needed (for headaches.).    Marland Kitchen aspirin EC 81 MG tablet Take 81 mg by mouth daily.    Marland Kitchen aspirin-acetaminophen-caffeine (EXCEDRIN MIGRAINE) 250-250-65 MG tablet Take 2 tablets by mouth every 6 (six) hours as needed for headache.    Marland Kitchen atorvastatin (LIPITOR)  10 MG tablet Take 10 mg by mouth every evening.     . Cholecalciferol 2000 UNITS CAPS Take 4,000 Units by mouth daily.     . colesevelam (WELCHOL) 625 MG tablet Take 1,875 mg by mouth every evening.     Marland Kitchen levothyroxine (SYNTHROID, LEVOTHROID) 50 MCG tablet Take 50 mcg by mouth daily before breakfast.    . Melatonin 5 MG TABS Take 5 mg by mouth at bedtime as needed (for sleep.).    Marland Kitchen metFORMIN (GLUCOPHAGE) 1000 MG tablet Take 1,000 mg by mouth 2 (two) times daily.    . quinapril (ACCUPRIL) 5 MG tablet Take 5 mg by mouth daily.     . SODIUM BICARBONATE PO Take 1,300 mg by mouth at bedtime.     No current facility-administered medications for this visit.     Review of Systems:  GENERAL:  Feels "ok".  No fevers or sweats.  Weight loss of 1-2 pounds recently secondary to stress. PERFORMANCE STATUS (ECOG):  0 HEENT:  No visual changes, runny nose, sore throat, mouth sores or tenderness. Lungs: No shortness of breath or cough.  No hemoptysis. Cardiac:  No chest pain, palpitations, orthopnea, or PND. GI:  Takes Welchol after gallbladder removal.  No nausea, vomiting, diarrhea, constipation, melena or hematochezia.  Mid right sided abdominal discomfort off/on. Colonoscopy 2017. GU:  No urgency, frequency, dysuria, or hematuria. Musculoskeletal:  No back pain.  No joint pain.  No muscle tenderness. Extremities:  No pain or swelling. Skin:  No rashes or skin changes. Neuro:  No headache, numbness or weakness, balance or coordination issues. Endocrine:  No diabetes, thyroid issues, hot flashes or night sweats. Psych:  Stress.  No depression or anxiety. Pain:  No focal pain. Review of systems:  All other systems reviewed and found to be negative.  Physical Exam: Blood pressure (!) 145/82, pulse 89, temperature 99 F (37.2 C), temperature source Tympanic, resp. rate 18, height '5\' 8"'$  (1.727 m), weight 153 lb 4 oz (69.5 kg). GENERAL:  Well developed, well nourished, woman sitting comfortably in the  exam room in no acute distress. MENTAL STATUS:  Alert and oriented to person, place and time. HEAD:  Short gray hair.  Normocephalic, atraumatic, face symmetric, no Cushingoid features. EYES:  Glasses.  Blue eyes.  Pupils equal round and reactive to light and accomodation.  No conjunctivitis or scleral icterus. ENT:  Oropharynx clear without lesion.  Tongue normal. Mucous membranes moist.  RESPIRATORY:  Clear to auscultation without rales, wheezes or rhonchi. CARDIOVASCULAR:  Regular rate and rhythm without murmur, rub or gallop. BREAST:  Right breast ecchymosis with fullness in the upper outer quadrant.  No skin changes or nipple discharge.  Left breast with fibrocystic changes.  No masses, skin changes or nipple discharge.  ABDOMEN:  Soft, slightly tender right mid abdomen without guarding or rebound tenderness. Active bowel sounds and no hepatosplenomegaly.  No masses. SKIN:  No rashes, ulcers or lesions. EXTREMITIES: No edema, no skin discoloration or tenderness.  No palpable cords. LYMPH NODES: 2-2.5 cm right axillary lymph node.  No palpable cervical, supraclavicular, or inguinal adenopathy  NEUROLOGICAL: Unremarkable. PSYCH:  Appropriate.   Appointment on 06/04/2017  Component Date Value Ref Range Status  . CA 15-3 06/04/2017 17.6  0.0 - 25.0 U/mL Final   Comment: (NOTE) Roche Diagnostics Electrochemiluminescence Immunoassay (ECLIA) Values obtained with different assay methods or kits cannot be used interchangeably.  Results cannot be interpreted as absolute evidence of the presence or absence of malignant disease. Performed At: [facility] [place], [place] [id number] Rush Farmer MD OA:[id number] Performed at [facility], [address] [place], [place] [address]   . CA 27.29 06/04/2017 16.5  0.0 - 38.6 U/mL Final   Comment: (NOTE) Siemens Centaur Immunochemiluminometric Methodology [facility]) Values obtained with different assay methods or kits  cannot be used interchangeably. Results cannot be interpreted as absolute evidence of the presence or absence of malignant disease. Performed At: [facility] [place], [place] [id number] Rush Farmer MD [id number] Performed at [facility], [address] [place], [place] [address]   . Sodium 06/04/2017 141  135 - 145 mmol/L Final  . Potassium 06/04/2017 3.8  3.5 - 5.1 mmol/L Final  . Chloride 06/04/2017 103  101 - 111 mmol/L Final  . CO2 06/04/2017 28  22 - 32 mmol/L Final  . Glucose, Bld 06/04/2017 144* 65 - 99 mg/dL Final  . BUN 06/04/2017 12  6 - 20 mg/dL Final  . Creatinine, Ser 06/04/2017 0.88  0.44 - 1.00 mg/dL Final  . Calcium 06/04/2017 9.8  8.9 - 10.3 mg/dL Final  . Total Protein 06/04/2017 7.5  6.5 - 8.1 g/dL Final  . Albumin 06/04/2017 4.6  3.5 - 5.0 g/dL Final  . AST 06/04/2017 26  15 - 41 U/L Final  . ALT 06/04/2017 25  14 - 54 U/L Final  . Alkaline Phosphatase 06/04/2017 50  38 - 126 U/L Final  . Total Bilirubin 06/04/2017 0.9  0.3 - 1.2 mg/dL Final  . GFR calc non Af Amer 06/04/2017 >60  >60 mL/min Final  . GFR calc Af Amer 06/04/2017 >60  >60 mL/min Final   Comment: (NOTE) The eGFR has been calculated using the CKD EPI equation. This calculation has not been validated in all clinical situations. eGFR's persistently <60 mL/min signify possible Chronic Kidney Disease.   Georgiann Hahn gap 06/04/2017 10  5 - 15 Final   Performed at [facility], [address] [place], [place] [address]  . WBC 06/04/2017 5.8  3.6 - 11.0 K/uL Final  . RBC 06/04/2017 4.84  3.80 - 5.20 MIL/uL Final  . Hemoglobin 06/04/2017 14.7  12.0 - 16.0 g/dL Final  . HCT 06/04/2017 43.8  35.0 - 47.0 % Final  . MCV 06/04/2017 90.4  80.0 - 100.0 fL Final  . MCH 06/04/2017 30.4  26.0 - 34.0 pg Final  . MCHC 06/04/2017 33.7  32.0 - 36.0 g/dL Final  . RDW 06/04/2017 13.6  11.5 - 14.5 % Final  . Platelets 06/04/2017 209  150 - 440 K/uL Final  . Neutrophils  Relative % 06/04/2017 64  % Final  . Neutro Abs 06/04/2017 3.7  1.4 - 6.5 K/uL  Final  . Lymphocytes Relative 06/04/2017 23  % Final  . Lymphs Abs 06/04/2017 1.3  1.0 - 3.6 K/uL Final  . Monocytes Relative 06/04/2017 9  % Final  . Monocytes Absolute 06/04/2017 0.5  0.2 - 0.9 K/uL Final  . Eosinophils Relative 06/04/2017 3  % Final  . Eosinophils Absolute 06/04/2017 0.1  0 - 0.7 K/uL Final  . Basophils Relative 06/04/2017 1  % Final  . Basophils Absolute 06/04/2017 0.0  0 - 0.1 K/uL Final   Performed at [facility], [address], [place], [place] [address]    Assessment:  NOMI RUDNICKI is a 72 y.o. female with multi-focal clinical stage [id number] right breast cancer s/p biopsy on 05/28/2017.  Pathology revealed two grade II invasive mammary carcinomas of no special type (4 cm from the nipple and 5 cm from the nipple) approximately 2.4 cm apart.  Lymph node biopsy confirmed metastatic carcinoma of breast origin.  Tumor is ER negative (< 1%), PR negative and Her 2/neu 2+ by IHC.  Right sided mammogram and ultrasound on 05/21/2017 revealed 2 highly suspicious masses in the right breast at the 10:30 position 4 cm from nipple (1.8 x 1.2 x 1.1 cm) and at the 10:30 position 5 cm from nipple (1.1 x 0.9 x 0.9 cm).  There was a suspicious 2.7 x 1 x 2.2 cm lymph node in the right axilla with asymmetric nodular cortical thickening.  Ultrasound guided biopsy of the 2 masses and lymph node on 05/28/2017 revealed grade II invasive mammary carcinoma of no special type (4 cm from the nipple and 5 cm from the nipple).  Lymph node biopsy revealed metastatic carcinoma of breast origin.  Tumor is ER negative (< 1%), PR negative and Her 2/neu 2+ by IHC.  Symptomatically, she feels "ok".  She has recently lost weight secondary to stress.  Exam reveals right breast ecchymosis s/p biopsy with fullness in the upper outer quadrant.  She has a palpable right axillary lymph node.  Plan: 1.  Discuss diagnosis,  staging, and management of breast cancer.  Discuss multi-focal disease.  Discuss hormone receptor negative (ER and PR) status.  Await results of Her2/neu testing.  Tumor will either be a triple negative tumor or Her2/neu +.  Discuss aggressive nature of both tumors.  Discuss addition of Her2/neu agents (Herceptin and Perjeta) to chemotherapy if she is Her2/neu+.  Discuss Taxotere and carboplatin [facility]).  Discuss treatment is individualized in patients > 70.  Discuss consideration of an anthracycline [facility]) followed by Taxol for triple negative disease.  Consider addition of a platinum agent to Taxol.  Discuss obtaining a baseline echo.  Discuss plan for surgery.  Given her lymph node positive status and aggressive tumor, would consider neoadjuvant chemotherapy prior to surgery.  Discuss need for port-a-cath placement.  Discuss patient's plans for lumpectomy and sentinel lymph node biopsy.  Discuss full axillary node dissection with upfront surgery.  Discuss possible sentinel lymph node procedure after neoadjuvant chemotherapy if patient achieves good response in the axillae.  Discuss issues with lymphedema.  Discuss need for radiation after lumpectomy.  Discuss no endocrine therapy as patient is ER and PR negative.   2.  Labs today:  CBC with diff, CMP, CA27.29 and CA15-3. 3.  Discuss plan for genetic testing ([facility]).  Patient in agreement.  Will draw at next visit. 4.  Schedule echo: assess EF. 5.  RTC in 1 week for MD assessment, review of Her2/neu status, echo, and discussion regarding direction of therapy.  Lequita Asal, MD  06/04/2017, 4:35 PM

## 2017-06-05 ENCOUNTER — Ambulatory Visit: Payer: Self-pay | Admitting: General Surgery

## 2017-06-05 ENCOUNTER — Telehealth: Payer: Self-pay | Admitting: *Deleted

## 2017-06-05 LAB — CANCER ANTIGEN 27.29: CA 27.29: 16.5 U/mL (ref 0.0–38.6)

## 2017-06-05 LAB — SURGICAL PATHOLOGY

## 2017-06-05 LAB — CANCER ANTIGEN 15-3: CA 15-3: 17.6 U/mL (ref 0.0–25.0)

## 2017-06-05 NOTE — H&P (View-Only) (Signed)
PATIENT PROFILE: Stephanie Sanders is a 72 y.o. female who presents to the Clinic for consultation at the request of Dr. Edwina Barth for evaluation of breast cancer of the right breast.  PCP:  [name], MD  HISTORY OF PRESENT ILLNESS: [name] reports having her routine screening mammography were it was found to have a suspicious lesion. Then, patient has a diagnostic mammography and a biopsy of the lesions and it was found with invasive mammary cancer. On the images it was found to have an enlarged lymph node and it was found to have malignant cells from breast cancer. Patient denies changes on her breast, no palpable masses, no palpable lymph nodes, no skin changes, no nipple retraction or drainage.  Patient refers menarche at 22 years and last menstrual cycle at 72 years old.  No family history diagnosed of breast or ovarian cancer No radiation to chest Patient had estrogen therapy for less than a year Used birth control pill First pregnancy at 72 years old (3 pregnancies in total)   PROBLEM LIST:         Problem List  Date Reviewed: 04/09/2017         Noted   Type 2 diabetes mellitus with complication, without long-term current use of insulin , unspecified (CMS-HCC) 05/17/2015   Hypothyroidism due to acquired atrophy of thyroid 11/02/2014   Transient memory loss 07/23/2012   Overview    S/p admission to [facility] 07/2012, neg carotid doppler, neg brain MRI, ECHo-normal EF, mod MR, mild to mod TR, mild dilation of left and right atrium, normal TSH      Abdominal pain, unspecified 12/09/2011   Fatty liver 09/18/2011   Overview    Negative hep A,B,C 11/2011 Abd [place] confirms fatty liver 11/2011      Hyperlipidemia, unspecified Unknown   Overview                02/2011            08/2011             LDL      91                        139 trigs     188                        162      Hypertension Unknown   Vitamin D deficiency, unspecified Unknown    Overview                            08/2011 Vit D                43.9      Seborrheic keratosis Unknown      GENERAL REVIEW OF SYSTEMS:   General ROS: negative for - chills, fatigue, fever, weight gain or weight loss Allergy and Immunology ROS: negative for - hives  Hematological and Lymphatic ROS: negative for - bleeding problems or bruising, negative for palpable nodes Endocrine ROS: negative for - heat or cold intolerance, hair changes Respiratory ROS: negative for - cough, shortness of breath or wheezing Cardiovascular ROS: no chest pain. Positive for palpitations GI ROS: negative for nausea, vomiting, abdominal pain, diarrhea, constipation Musculoskeletal ROS: negative for - joint swelling or muscle pain Neurological ROS: negative for - confusion, syncope. Positive for headaches.  Dermatological ROS: negative for pruritus  and rash Psychiatric: negative for anxiety, depression, difficulty sleeping and memory loss  MEDICATIONS: CurrentMedications        Current Outpatient Medications  Medication Sig Dispense Refill  . aspirin 81 MG chewable tablet Take 81 mg by mouth once daily.    Marland Kitchen atorvastatin (LIPITOR) 10 MG tablet take 1 tablet by mouth once daily 30 tablet 5  . blood glucose diagnostic (ONETOUCH ULTRA TEST) test strip Use 1 each once daily. Use as instructed. 100 each 3  . blood glucose meter kit kit One Touch Ultra Meter Use as instructed. 1 each 0  . cholecalciferol (VITAMIN D3) 2,000 unit capsule Take 4,000 Units by mouth daily.     . colesevelam (WELCHOL) 625 mg tablet take 3 tablets by mouth once daily 90 tablet 3  . melatonin 3 mg tablet Take 3 mg by mouth nightly.    . metFORMIN (GLUCOPHAGE) 500 MG tablet take 2 tablets by mouth twice a day with meals 120 tablet 3  . nystatin (MYCOSTATIN) 100,000 unit/gram cream Apply topically 2 (two) times daily 15 g 2  . quinapril (ACCUPRIL) 5 MG tablet take 1 tablet by mouth once daily 30 tablet 5  . SYNTHROID  50 mcg tablet take 1 tablet by mouth every morning ON AN EMPTY STOMACH 30 tablet 5   No current facility-administered medications for this visit.       ALLERGIES: Fiorinal [butalbital-aspirin-caffeine]; Septra [sulfamethoxazole-trimethoprim]; Sulfa (sulfonamide antibiotics); and Tramadol  PAST MEDICAL HISTORY:     Past Medical History:  Diagnosis Date  . Diabetic nephropathy (CMS-HCC) 1999  . Heart murmur, unspecified 07/2012   normal EF, mod MR, mild to mod TR, mild dilation of left and right atrium  . History of migraine   . Hypertension   . Seborrheic keratosis   . Serrated adenoma of colon, unspecified 01/01/2015  . Thyroid disease   . Vitamin D deficiency, unspecified     PAST SURGICAL HISTORY:      Past Surgical History:  Procedure Laterality Date  . APPENDECTOMY  1980  . CHOLECYSTECTOMY  2011   stent placed due to bile leak  . COLONOSCOPY  01/01/2015   Serrated adenoma of colon/Repeat 36yr/MGR  . EYELID SURGERY  2006  . TUBAL LIGATION       FAMILY HISTORY:      Family History  Problem Relation Age of Onset  . Coronary Artery Disease (Blocked arteries around heart) Father 55       tobacco use, poor diet  . Lung cancer Father   . Pancreatic cancer Father   . Ovarian cancer Maternal Aunt 564 . Diabetes type II Mother   . Parkinsonism Mother   . Thyroid disease Mother   . Diabetes type II Brother   . Stroke Paternal Aunt   . Diabetes type II Paternal Grandmother   . Stroke Paternal Grandfather   . Lung cancer Maternal Aunt   . Breast cancer Neg Hx   . Osteoporosis (Thinning of bones) Neg Hx      SOCIAL HISTORY: Social History        Socioeconomic History  . Marital status: Married    Spouse name: WCletus Gash . Number of children: 3  . Years of education: 172 . Highest education level: Not on file  Occupational History  . Occupation: Retired-[employer] job  Social Needs  . Financial resource strain: Not  on file  . Food insecurity:    Worry: Not on file    Inability: Not on file  .  Transportation needs:    Medical: Not on file    Non-medical: Not on file  Tobacco Use  . Smoking status: Former Smoker    Packs/day: 2.00    Years: 10.00    Pack years: 20.00    Types: Cigarettes    Last attempt to quit: 04/22/1975    Years since quitting: 42.1  . Smokeless tobacco: Never Used  . Tobacco comment: stopped 36 years ago  Substance and Sexual Activity  . Alcohol use: No    Alcohol/week: 0.0 oz    Comment: stopped 1996  . Drug use: No  . Sexual activity: Yes    Partners: Male  Lifestyle  . Physical activity:    Days per week: Not on file    Minutes per session: Not on file  . Stress: Not on file  Relationships  . Social connections:    Talks on phone: Not on file    Gets together: Not on file    Attends religious service: Not on file    Active member of club or organization: Not on file    Attends meetings of clubs or organizations: Not on file    Relationship status: Not on file  . Intimate partner violence:    Fear of current or ex partner: Not on file    Emotionally abused: Not on file    Physically abused: Not on file    Forced sexual activity: Not on file  Other Topics Concern  . Not on file  Social History Narrative  . Not on file    PHYSICAL EXAM: Body mass index is 24.41 kg/m. Weight: 71.2 kg (157 lb)     Vitals:   06/02/17 1343  BP: 146/77  Pulse: 76  Pain score 0  GENERAL: Alert, active, oriented x3  HEENT: Pupils equal reactive to light. Extraocular movements are intact. Sclera clear. Palpebral conjunctiva normal red color.Pharynx clear.  NECK: Supple with no palpable mass and no adenopathy.  LUNGS: Sound clear with no rales rhonchi or wheezes.  HEART: Regular rhythm S1 and S2 without murmur.  BREAST: bilateral breast examination done with patient on supine and sitting position. Both breast  palpated and no masses were identified. echymosis was appreciated on the right breast outer region from previous biopsy. No axillary node palpated. No nipple retraction, no nipple discharge.   ABDOMEN: Soft and depressible, nontender with no palpable mass, no hepatomegaly.  EXTREMITIES: Well-developed well-nourished symmetrical with no dependent edema.  NEUROLOGICAL: Awake alert oriented, facial expression symmetrical, moving all extremities.  REVIEW OF DATA: I have reviewed the following data today:      Appointment on 04/02/2017  Component Date Value  . Glucose 04/02/2017 139*  . Sodium 04/02/2017 140   . Potassium 04/02/2017 4.1   . Chloride 04/02/2017 101   . Carbon Dioxide (CO2) 04/02/2017 31.6   . Calcium 04/02/2017 9.7   . Urea Nitrogen (BUN) 04/02/2017 12   . Creatinine 04/02/2017 0.8   . Glomerular Filtration Ra* 04/02/2017 71   . BUN/Crea Ratio 04/02/2017 15.0   . Anion Gap w/K 04/02/2017 11.5   . Hemoglobin A1C 04/02/2017 6.4*  . Average Blood Glucose (C* 04/02/2017 137   Appointment on 03/05/2017  Component Date Value  . Color 03/05/2017 Yellow   . Clarity 03/05/2017 Clear   . Specific Gravity 03/05/2017 1.025   . pH, Urine 03/05/2017 5.0   . Protein, Urinalysis 03/05/2017 Negative   . Glucose, Urinalysis 03/05/2017 Negative   . Ketones, Urinalysis 03/05/2017 Trace*  .  Blood, Urinalysis 03/05/2017 Negative   . Nitrite, Urinalysis 03/05/2017 Negative   . Leukocyte Esterase, Urin* 03/05/2017 Negative   . White Blood Cells, Urina* 03/05/2017 0-3   . Red Blood Cells, Urinaly* 03/05/2017 0-3   . Bacteria, Urinalysis 03/05/2017 Few*  . Squamous Epithelial Cell* 03/05/2017 Few   . Hyaline Casts, Urinalysis 03/05/2017 5   . Urine Culture, Routine -* 03/05/2017 Final report   . Result 1 - [facility] 03/05/2017 No growth      ASSESSMENT: Ms. Madore is a 72 y.o. female presenting for consultation for right breast cancer. Patient found with an invasive mammary  carcinoma of the right breast. Suspected 2 lesions one next to the other with a span of 2.4cm. The invasive carcinoma is grade 2 ER/PR negative, Her2 unknown. Patient also with a positive right axillary lymph node for metastasis found on imaging, not palpated clinically.   With this finding patient was oriented about the pathology report, the diagnosis of breast cancer and the surgical alternatives. Patient was oriented about the procedure of Partial vs Total Mastectomy and Sentinel lymph node biopsy vs Axillary node dissection. Patient agreed to have partial mastectomy as the surgical alternative. The axillary procedure was discussed. Patient with indication of ALND but will like to avoid that procedure. I offered the patient the alternative of doing a Sentinel Lymph node biopsy and if the other nodes comes negative, the complete ALND may not be needed. Will discuss with oncologist of the scenario if the SLNB or other nodes comes negative if we will proceed with ALND. This discussion will continue with the Oncologist.   I spent > 60 minutes on this encounter and more than 50% was orienting, discussing and coordinating plan of care.   PLAN: 1. Partial Mastectomy (needle guided) with sentinel lymph node biopsy (03524) vs axillary lymph node dissection 19302 2. CBC, CMP, EKG 3. Internal Medicine Clearance 4. Do not take aspirin 5 days prior to surgery  Patient and and her friend verbalized understanding, all questions were answered, and were agreeable with the plan outlined above.    Herbert Pun, MD  Electronically signed by Herbert Pun, MD  Addendum:   The case was discussed with Dr. Mike Gip and we got to the conclusion that the best changes for the patient to avoid the axillary node dissection and assess the response of her cancer to chemotherapy is doing neoadjuvant chemotherapy.  This way, if the positive node response to therapy, a sentinel lymph node biopsy may be  done and if negative, patient may avoid the complete axillary node dissection.   The patient came today (06/05/17) to the office and was oriented about the discussion that I had with the Oncologist. Patient was oriented about the procedure of the Chemo port. Patient oriented about the benefits of the procedure and about the risks including: pneumothorax, bleeding, infection, hemothorax, arteriovenous fistula, pseudoaneurysm, cardiac events, anesthesia complication, death, among others.   Patient understood and agreed to proceed.

## 2017-06-05 NOTE — H&P (Signed)
PATIENT PROFILE: Stephanie Sanders is a 72 y.o. female who presents to the Clinic for consultation at the request of Dr. Edwina Barth for evaluation of breast cancer of the right breast.  PCP:  [name], MD  HISTORY OF PRESENT ILLNESS: Stephanie Sanders reports having her routine screening mammography were it was found to have a suspicious lesion. Then, patient has a diagnostic mammography and a biopsy of the lesions and it was found with invasive mammary cancer. On the images it was found to have an enlarged lymph node and it was found to have malignant cells from breast cancer. Patient denies changes on her breast, no palpable masses, no palpable lymph nodes, no skin changes, no nipple retraction or drainage.  Patient refers menarche at 22 years and last menstrual cycle at 72 years old.  No family history diagnosed of breast or ovarian cancer No radiation to chest Patient had estrogen therapy for less than a year Used birth control pill First pregnancy at 72 years old (3 pregnancies in total)   PROBLEM LIST:         Problem List  Date Reviewed: 04/09/2017         Noted   Type 2 diabetes mellitus with complication, without long-term current use of insulin , unspecified (CMS-HCC) 05/17/2015   Hypothyroidism due to acquired atrophy of thyroid 11/02/2014   Transient memory loss 07/23/2012   Overview    S/p admission to [facility] 07/2012, neg carotid doppler, neg brain MRI, ECHo-normal EF, mod MR, mild to mod TR, mild dilation of left and right atrium, normal TSH      Abdominal pain, unspecified 12/09/2011   Fatty liver 09/18/2011   Overview    Negative hep A,B,C 11/2011 Abd [place] confirms fatty liver 11/2011      Hyperlipidemia, unspecified Unknown   Overview                02/2011            08/2011             LDL      91                        139 trigs     188                        162      Hypertension Unknown   Vitamin D deficiency, unspecified Unknown    Overview                            08/2011 Vit D                43.9      Seborrheic keratosis Unknown      GENERAL REVIEW OF SYSTEMS:   General ROS: negative for - chills, fatigue, fever, weight gain or weight loss Allergy and Immunology ROS: negative for - hives  Hematological and Lymphatic ROS: negative for - bleeding problems or bruising, negative for palpable nodes Endocrine ROS: negative for - heat or cold intolerance, hair changes Respiratory ROS: negative for - cough, shortness of breath or wheezing Cardiovascular ROS: no chest pain. Positive for palpitations GI ROS: negative for nausea, vomiting, abdominal pain, diarrhea, constipation Musculoskeletal ROS: negative for - joint swelling or muscle pain Neurological ROS: negative for - confusion, syncope. Positive for headaches.  Dermatological ROS: negative for pruritus  and rash Psychiatric: negative for anxiety, depression, difficulty sleeping and memory loss  MEDICATIONS: CurrentMedications        Current Outpatient Medications  Medication Sig Dispense Refill  . aspirin 81 MG chewable tablet Take 81 mg by mouth once daily.    Marland Kitchen atorvastatin (LIPITOR) 10 MG tablet take 1 tablet by mouth once daily 30 tablet 5  . blood glucose diagnostic (ONETOUCH ULTRA TEST) test strip Use 1 each once daily. Use as instructed. 100 each 3  . blood glucose meter kit kit One Touch Ultra Meter Use as instructed. 1 each 0  . cholecalciferol (VITAMIN D3) 2,000 unit capsule Take 4,000 Units by mouth daily.     . colesevelam (WELCHOL) 625 mg tablet take 3 tablets by mouth once daily 90 tablet 3  . melatonin 3 mg tablet Take 3 mg by mouth nightly.    . metFORMIN (GLUCOPHAGE) 500 MG tablet take 2 tablets by mouth twice a day with meals 120 tablet 3  . nystatin (MYCOSTATIN) 100,000 unit/gram cream Apply topically 2 (two) times daily 15 g 2  . quinapril (ACCUPRIL) 5 MG tablet take 1 tablet by mouth once daily 30 tablet 5  . SYNTHROID  50 mcg tablet take 1 tablet by mouth every morning ON AN EMPTY STOMACH 30 tablet 5   No current facility-administered medications for this visit.       ALLERGIES: Fiorinal [butalbital-aspirin-caffeine]; Septra [sulfamethoxazole-trimethoprim]; Sulfa (sulfonamide antibiotics); and Tramadol  PAST MEDICAL HISTORY:     Past Medical History:  Diagnosis Date  . Diabetic nephropathy (CMS-HCC) 1999  . Heart murmur, unspecified 07/2012   normal EF, mod MR, mild to mod TR, mild dilation of left and right atrium  . History of migraine   . Hypertension   . Seborrheic keratosis   . Serrated adenoma of colon, unspecified 01/01/2015  . Thyroid disease   . Vitamin D deficiency, unspecified     PAST SURGICAL HISTORY:      Past Surgical History:  Procedure Laterality Date  . APPENDECTOMY  1980  . CHOLECYSTECTOMY  2011   stent placed due to bile leak  . COLONOSCOPY  01/01/2015   Serrated adenoma of colon/Repeat 36yr/MGR  . EYELID SURGERY  2006  . TUBAL LIGATION       FAMILY HISTORY:      Family History  Problem Relation Age of Onset  . Coronary Artery Disease (Blocked arteries around heart) Father 55       tobacco use, poor diet  . Lung cancer Father   . Pancreatic cancer Father   . Ovarian cancer Maternal Aunt 564 . Diabetes type II Mother   . Parkinsonism Mother   . Thyroid disease Mother   . Diabetes type II Brother   . Stroke Paternal Aunt   . Diabetes type II Paternal Grandmother   . Stroke Paternal Grandfather   . Lung cancer Maternal Aunt   . Breast cancer Neg Hx   . Osteoporosis (Thinning of bones) Neg Hx      SOCIAL HISTORY: Social History        Socioeconomic History  . Marital status: Married    Spouse name: WCletus Gash . Number of children: 3  . Years of education: 172 . Highest education level: Not on file  Occupational History  . Occupation: Retired-[employer] job  Social Needs  . Financial resource strain: Not  on file  . Food insecurity:    Worry: Not on file    Inability: Not on file  .  Transportation needs:    Medical: Not on file    Non-medical: Not on file  Tobacco Use  . Smoking status: Former Smoker    Packs/day: 2.00    Years: 10.00    Pack years: 20.00    Types: Cigarettes    Last attempt to quit: 04/22/1975    Years since quitting: 42.1  . Smokeless tobacco: Never Used  . Tobacco comment: stopped 36 years ago  Substance and Sexual Activity  . Alcohol use: No    Alcohol/week: 0.0 oz    Comment: stopped 1996  . Drug use: No  . Sexual activity: Yes    Partners: Male  Lifestyle  . Physical activity:    Days per week: Not on file    Minutes per session: Not on file  . Stress: Not on file  Relationships  . Social connections:    Talks on phone: Not on file    Gets together: Not on file    Attends religious service: Not on file    Active member of club or organization: Not on file    Attends meetings of clubs or organizations: Not on file    Relationship status: Not on file  . Intimate partner violence:    Fear of current or ex partner: Not on file    Emotionally abused: Not on file    Physically abused: Not on file    Forced sexual activity: Not on file  Other Topics Concern  . Not on file  Social History Narrative  . Not on file    PHYSICAL EXAM: Body mass index is 24.41 kg/m. Weight: 71.2 kg (157 lb)     Vitals:   06/02/17 1343  BP: 146/77  Pulse: 76  Pain score 0  GENERAL: Alert, active, oriented x3  HEENT: Pupils equal reactive to light. Extraocular movements are intact. Sclera clear. Palpebral conjunctiva normal red color.Pharynx clear.  NECK: Supple with no palpable mass and no adenopathy.  LUNGS: Sound clear with no rales rhonchi or wheezes.  HEART: Regular rhythm S1 and S2 without murmur.  BREAST: bilateral breast examination done with patient on supine and sitting position. Both breast  palpated and no masses were identified. echymosis was appreciated on the right breast outer region from previous biopsy. No axillary node palpated. No nipple retraction, no nipple discharge.   ABDOMEN: Soft and depressible, nontender with no palpable mass, no hepatomegaly.  EXTREMITIES: Well-developed well-nourished symmetrical with no dependent edema.  NEUROLOGICAL: Awake alert oriented, facial expression symmetrical, moving all extremities.  REVIEW OF DATA: I have reviewed the following data today:      Appointment on 04/02/2017  Component Date Value  . Glucose 04/02/2017 139*  . Sodium 04/02/2017 140   . Potassium 04/02/2017 4.1   . Chloride 04/02/2017 101   . Carbon Dioxide (CO2) 04/02/2017 31.6   . Calcium 04/02/2017 9.7   . Urea Nitrogen (BUN) 04/02/2017 12   . Creatinine 04/02/2017 0.8   . Glomerular Filtration Ra* 04/02/2017 71   . BUN/Crea Ratio 04/02/2017 15.0   . Anion Gap w/K 04/02/2017 11.5   . Hemoglobin A1C 04/02/2017 6.4*  . Average Blood Glucose (C* 04/02/2017 137   Appointment on 03/05/2017  Component Date Value  . Color 03/05/2017 Yellow   . Clarity 03/05/2017 Clear   . Specific Gravity 03/05/2017 1.025   . pH, Urine 03/05/2017 5.0   . Protein, Urinalysis 03/05/2017 Negative   . Glucose, Urinalysis 03/05/2017 Negative   . Ketones, Urinalysis 03/05/2017 Trace*  .  Blood, Urinalysis 03/05/2017 Negative   . Nitrite, Urinalysis 03/05/2017 Negative   . Leukocyte Esterase, Urin* 03/05/2017 Negative   . White Blood Cells, Urina* 03/05/2017 0-3   . Red Blood Cells, Urinaly* 03/05/2017 0-3   . Bacteria, Urinalysis 03/05/2017 Few*  . Squamous Epithelial Cell* 03/05/2017 Few   . Hyaline Casts, Urinalysis 03/05/2017 5   . Urine Culture, Routine -* 03/05/2017 Final report   . Result 1 - [facility] 03/05/2017 No growth      ASSESSMENT: Ms. Woodle is a 72 y.o. female presenting for consultation for right breast cancer. Patient found with an invasive mammary  carcinoma of the right breast. Suspected 2 lesions one next to the other with a span of 2.4cm. The invasive carcinoma is grade 2 ER/PR negative, Her2 unknown. Patient also with a positive right axillary lymph node for metastasis found on imaging, not palpated clinically.   With this finding patient was oriented about the pathology report, the diagnosis of breast cancer and the surgical alternatives. Patient was oriented about the procedure of Partial vs Total Mastectomy and Sentinel lymph node biopsy vs Axillary node dissection. Patient agreed to have partial mastectomy as the surgical alternative. The axillary procedure was discussed. Patient with indication of ALND but will like to avoid that procedure. I offered the patient the alternative of doing a Sentinel Lymph node biopsy and if the other nodes comes negative, the complete ALND may not be needed. Will discuss with oncologist of the scenario if the SLNB or other nodes comes negative if we will proceed with ALND. This discussion will continue with the Oncologist.   I spent > 60 minutes on this encounter and more than 50% was orienting, discussing and coordinating plan of care.   PLAN: 1. Partial Mastectomy (needle guided) with sentinel lymph node biopsy (78588) vs axillary lymph node dissection 19302 2. CBC, CMP, EKG 3. Internal Medicine Clearance 4. Do not take aspirin 5 days prior to surgery  Patient and and her friend verbalized understanding, all questions were answered, and were agreeable with the plan outlined above.    Herbert Pun, MD  Electronically signed by Herbert Pun, MD  Addendum:   The case was discussed with Dr. Mike Gip and we got to the conclusion that the best changes for the patient to avoid the axillary node dissection and assess the response of her cancer to chemotherapy is doing neoadjuvant chemotherapy.  This way, if the positive node response to therapy, a sentinel lymph node biopsy may be  done and if negative, patient may avoid the complete axillary node dissection.   The patient came today (06/05/17) to the office and was oriented about the discussion that I had with the Oncologist. Patient was oriented about the procedure of the Chemo port. Patient oriented about the benefits of the procedure and about the risks including: pneumothorax, bleeding, infection, hemothorax, arteriovenous fistula, pseudoaneurysm, cardiac events, anesthesia complication, death, among others.   Patient understood and agreed to proceed.

## 2017-06-05 NOTE — Telephone Encounter (Signed)
Called patient and made her aware of her appt for her ECHO scheduled for  06/09/17 @ 10:00 and also her RTC for MD assessment on 06/11/17 @ 2:15 Also a letter will be mailed out today.

## 2017-06-08 ENCOUNTER — Encounter: Payer: Self-pay | Admitting: Diagnostic Radiology

## 2017-06-08 ENCOUNTER — Encounter
Admission: RE | Admit: 2017-06-08 | Discharge: 2017-06-08 | Disposition: A | Discharge status: Home / Self Care | Payer: Medicare Other | Source: Ambulatory Visit | Attending: General Surgery | Admitting: General Surgery

## 2017-06-08 ENCOUNTER — Encounter: Payer: Self-pay | Admitting: Hematology and Oncology

## 2017-06-08 ENCOUNTER — Other Ambulatory Visit: Payer: Self-pay

## 2017-06-08 HISTORY — DX: Malignant (primary) neoplasm, unspecified: C80.1

## 2017-06-08 NOTE — Pre-Procedure Instructions (Signed)
Progress Notes - documented in this encounter  Stephanie Ochs, MD - 06/05/2017 3:45 PM EST Formatting of this note might be different from the original. Chief Complaint:   Chief Complaint  Patient presents with  . Medical Clearance For [facility]  surgery   Subjective:   Stephanie Sanders is a 72 y.o. female in today to be evaluated for preop clearance. Surgery was upcoming next week. However she tells me that her surgeon and oncologist had consultation late this afternoon before she came in and decided to put a Port-A-Cath in the next week and do chemotherapy before doing the partial or full mastectomy. She does not have a history of coronary artery disease and has had no recent chest pain or shortness of breath or other symptoms.  Current Outpatient Medications  Medication Sig Dispense Refill  . aspirin 81 MG chewable tablet Take 81 mg by mouth once daily.  Marland Kitchen atorvastatin (LIPITOR) 10 MG tablet take 1 tablet by mouth once daily 30 tablet 5  . blood glucose diagnostic (ONETOUCH ULTRA TEST) test strip Use 1 each once daily. Use as instructed. 100 each 3  . blood glucose meter kit kit One Touch Ultra Meter Use as instructed. 1 each 0  . cholecalciferol (VITAMIN D3) 2,000 unit capsule Take 4,000 Units by mouth daily.  . colesevelam (WELCHOL) 625 mg tablet take 3 tablets by mouth once daily 90 tablet 3  . melatonin 3 mg tablet Take 3 mg by mouth nightly.  . metFORMIN (GLUCOPHAGE) 500 MG tablet take 2 tablets by mouth twice a day with meals 120 tablet 3  . nystatin (MYCOSTATIN) 100,000 unit/gram cream Apply topically 2 (two) times daily 15 g 2  . quinapril (ACCUPRIL) 5 MG tablet take 1 tablet by mouth once daily 30 tablet 5  . SYNTHROID 50 mcg tablet take 1 tablet by mouth every morning ON AN EMPTY STOMACH 30 tablet 5   No current facility-administered medications for this visit.   Allergies as of 06/05/2017 - Reviewed 06/05/2017  Allergen Reaction Noted  . Fiorinal  [butalbital-aspirin-caffeine] Vomiting 07/23/2012  . Septra [sulfamethoxazole-trimethoprim] Unknown 09/10/2011  . Sulfa (sulfonamide antibiotics) Unknown 09/10/2011  . Tramadol Vomiting 07/23/2012   Past Medical History:  Diagnosis Date  . Diabetic nephropathy (CMS-HCC) 1999  . Heart murmur, unspecified 07/2012  normal EF, mod MR, mild to mod TR, mild dilation of left and right atrium  . History of migraine  . Hypertension  . Seborrheic keratosis  . Serrated adenoma of colon, unspecified 01/01/2015  . Thyroid disease  . Vitamin D deficiency, unspecified   Past Surgical History:  Procedure Laterality Date  . APPENDECTOMY 1980  . CHOLECYSTECTOMY 2011  stent placed due to bile leak  . COLONOSCOPY 01/01/2015  Serrated adenoma of colon/Repeat 47yr/MGR  . EYELID SURGERY 2006  . TUBAL LIGATION    Family History  Problem Relation Age of Onset  . Coronary Artery Disease (Blocked arteries around heart) Father 55  tobacco use, poor diet  . Lung cancer Father  . Pancreatic cancer Father  . Ovarian cancer Maternal Aunt 567 . Diabetes type II Mother  . Parkinsonism Mother  . Thyroid disease Mother  . Diabetes type II Brother  . Stroke Paternal Aunt  . Diabetes type II Paternal Grandmother  . Stroke Paternal Grandfather  . Lung cancer Maternal Aunt  . Breast cancer Neg Hx  . Osteoporosis (Thinning of bones) Neg Hx   Social History: reports that she quit smoking about 42 years ago. Her  smoking use included cigarettes. She has a 20.00 pack-year smoking history. She has never used smokeless tobacco. She reports that she does not drink alcohol or use drugs.  Results for orders placed or performed in visit on 06/05/17  CBC w/auto Differential (5 Part)  Result Value Ref Range  WBC (White Blood Cell Count) 6.6 4.1 - 10.2 10^3/uL  RBC (Red Blood Cell Count) 4.68 4.04 - 5.48 10^6/uL  Hemoglobin 14.2 12.0 - 15.0 gm/dL  Hematocrit 42.7 35.0 - 47.0 %  MCV (Mean Corpuscular Volume) 91.2  80.0 - 100.0 fl  MCH (Mean Corpuscular Hemoglobin) 30.3 27.0 - 31.2 pg  MCHC (Mean Corpuscular Hemoglobin Concentration) 33.3 32.0 - 36.0 gm/dL  Platelet Count 206 150 - 450 10^3/uL  RDW-CV (Red Cell Distribution Width) 12.6 11.6 - 14.8 %  MPV (Mean Platelet Volume) 10.2 9.4 - 12.4 fl  Neutrophils 3.58 1.50 - 7.80 10^3/uL  Lymphocytes 2.07 1.00 - 3.60 10^3/uL  Monocytes 0.59 0.00 - 1.50 10^3/uL  Eosinophils 0.24 0.00 - 0.55 10^3/uL  Basophils 0.05 0.00 - 0.09 10^3/uL  Neutrophil % 54.6 32.0 - 70.0 %  Lymphocyte % 31.6 10.0 - 50.0 %  Monocyte % 9.0 4.0 - 13.0 %  Eosinophil % 3.7 1.0 - 5.0 %  Basophil% 0.8 0.0 - 2.0 %  Immature Granulocyte % 0.3 <=0.7 %  Immature Granulocyte Count 0.02 <=0.06 10^3/L  Comprehensive Metabolic Panel (CMP)  Result Value Ref Range  Glucose 100 70 - 110 mg/dL  Sodium 141 136 - 145 mmol/L  Potassium 4.0 3.6 - 5.1 mmol/L  Chloride 104 97 - 109 mmol/L  Carbon Dioxide (CO2) 28.2 22.0 - 32.0 mmol/L  Urea Nitrogen (BUN) 14 7 - 25 mg/dL  Creatinine 0.8 0.6 - 1.1 mg/dL  Glomerular Filtration Rate (eGFR), MDRD Estimate 71 >60 mL/min/1.73sq m  Calcium 9.9 8.7 - 10.3 mg/dL  AST 14 8 - 39 U/L  ALT 20 5 - 38 U/L  Alk Phos (alkaline Phosphatase) 42 34 - 104 U/L  Albumin 4.4 3.5 - 4.8 g/dL  Bilirubin, Total 0.7 0.3 - 1.2 mg/dL  Protein, Total 6.9 6.1 - 7.9 g/dL  A/G Ratio 1.8 1.0 - 5.0 gm/dL  ECG 12-lead  Result Value Ref Range  Vent Rate (bpm) 73  PR Interval (msec) 142  QRS Interval (msec) 80  QT Interval (msec) 376  QTc (msec) 414   ROS:  General: No fever, chills or recent illness. No change in weight Skin: No skin lesions, growths, masses, rashes, pruritus  HEENT: No change in vision or hearing. No pain or difficulty with swallowing Respiratory: No cough or shortness of breath CV: No chest pain or palpitations GI: No pain, dyspepsia or change in bowel habits GU: No dysuria, frequency, or hesitancy MSK: No joint pain or injury Neurological: No  headaches, changes in mental status, loss of sensation or strength Endocrine: No heat or cold intolerance, polydipsia, polyuria  Objective:   Body mass index is 24.1 kg/m.  BP 122/78 (BP Location: Left upper arm, Patient Position: Sitting, BP Cuff Size: Adult)  Pulse 72  Ht 170.8 cm (5' 7.25")  Wt 70.3 kg (155 lb)  SpO2 99%  BMI 24.10 kg/m   General: WD/WN female, in no acute distress HEENT: Pupils equal and round, EOMI. oral mucosa moist. Oropharynx clear. Neck: supple, trachea midline; no thyromegaly Respiratory:clear to auscultation. No dullness to percussion. No use of accessory muscles. Cardiac: Regular rate and rhythm without murmur, gallops, or rubs Vascular: Carotid and radials 2+; distal pulses 2+ Abdominal:soft, nontender,  positive bowel sounds. No organomegaly. Musculoskeletal: No clubbing, cyanosis or edema. Full range of motion in upper and lower extremities bilaterally. Neuro: CN grossly intact. No acute decrease in sensation in the upper and lower extremities bilaterally. 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 Preop evaluation. Evaluated her EKG which I interpret as normal sinus rhythm with some nonspecific T wave abnormalities. This is consistent with old EKG. She has had no new symptoms of cardiac disease. Have reviewed labs which show her electrolytes, renal function and hemoglobin are normal. This point would deem her a low risk from both medical and cardiac standpoint. Recommend proceed with any type of surgical procedure necessary.  Goals  None    Stephanie Ochs, MD  Portions of  this note were created using dictation software and may contain typographical errors.   Electronically signed by Stephanie Ochs, MD at 06/05/2017 5:00 PM EST     Plan of Treatment - documented as of this encounter  Upcoming Encounters Upcoming Encounters  Date Type Specialty Care Team Description  06/25/2017 [place], [place], [place], [place] [address]  [phone]  [phone] (Fax)    07/09/2017 Ancillary Orders Lab Stephanie Ochs, MD  [address] [place]  [place], [place] [address]  [phone]  [phone] (Fax)    07/16/2017 Office Visit Internal Medicine Stephanie Sanders, [place]  [place],  [address]  [phone]  [phone] (Fax)     Pending Results Pending Results  Name Priority Associated Diagnoses Date/Time  ECG 12-lead Routine Pre-op evaluation  06/05/2017 4:56 PM EST   Procedures - documented in this encounter  Procedure Name Priority Date/Time Associated Diagnosis Comments  ECG 12-LEAD Routine 06/05/2017 4:56 PM EST Pre-op evaluation    CBC W/AUTO DIFFERENTIAL (5 PART DIFF) -[facility] Routine 06/05/2017 3:44 PM EST Pre-operative clearance  Results for this procedure are in the results section.   COMPREHENSIVE METABOLIC PANEL (CMP) - [facility] Routine 06/05/2017 3:44 PM EST Pre-operative clearance  Results for this procedure are in the results section.    Lab Results - documented in this encounter  Table of Contents for Lab Results  Comprehensive Metabolic Panel (CMP) (48/25/0037 3:44 PM EST)  CBC w/auto Differential (5 Part) (06/05/2017 3:44 PM EST)     Comprehensive Metabolic Panel (CMP) (04/88/8916 3:44 PM EST) Comprehensive Metabolic Panel (CMP) (94/50/3888 3:44 PM EST)  Component Value Ref Range Performed At Pathologist Signature  Glucose 100 70 - 110 mg/dL [facility]   Sodium 141 136  - 145 mmol/L [facility]   Potassium 4.0 3.6 - 5.1 mmol/L [facility]   Chloride 104 97 - 109 mmol/L [facility]   Carbon Dioxide (CO2) 28.2 22.0 - 32.0 mmol/L [facility]   Urea Nitrogen (BUN) 14 7 - 25 mg/dL [facility]   Creatinine 0.8 0.6 - 1.1 mg/dL [facility]   Glomerular Filtration Rate (eGFR), MDRD Estimate 71 >60 mL/min/1.73sq m [place] [facility]   Calcium 9.9 8.7 - 10.3 mg/dL [facility]  AST  14 8 - 39 U/L [facility]   ALT  20 5 - 38 U/L [facility]   Alk Phos (alkaline Phosphatase) 42 34 - 104 U/L [facility]   Albumin 4.4 3.5 - 4.8 g/dL [facility]   Bilirubin, Total 0.7 0.3 - 1.2 mg/dL [facility]   Protein, Total 6.9 6.1 - 7.9 g/dL [facility]   A/G Ratio 1.8 1.0 - 5.0 gm/dL [facility]    Comprehensive Metabolic Panel (CMP) (70/35/0093 3:44 PM EST)  Specimen  Blood   Comprehensive Metabolic Panel (CMP) (81/82/9937 3:44 PM EST)  Performing Organization Address City/State/Zipcode Phone Number  [place]  [place], [place] [phone]    Back to top of Lab Results    CBC w/auto Differential (5 Part) (06/05/2017 3:44 PM EST) CBC w/auto Differential (5 Part) (06/05/2017 3:44 PM EST)  Component Value Ref Range Performed At Pathologist Signature  WBC (White Blood Cell Count) 6.6 4.1 - 10.2 10^3/uL [place] [facility]   RBC (Red Blood Cell Count) 4.68 4.04 - 5.48 10^6/uL [facility]   Hemoglobin 14.2 12.0 - 15.0 gm/dL [facility]   Hematocrit 42.7 35.0 - 47.0 % [facility]   MCV (Mean Corpuscular Volume) 91.2 80.0 - 100.0 fl [facility]   MCH (Mean Corpuscular Hemoglobin) 30.3 27.0 - 31.2 pg [facility]   MCHC (Mean  Corpuscular Hemoglobin Concentration) 33.3 32.0 - 36.0 gm/dL [facility]   Platelet Count 206 150 - 450 10^3/uL [facility]   RDW-CV (Red Cell Distribution Width) 12.6 11.6 - 14.8 % [facility]   MPV (Mean Platelet Volume) 10.2 9.4 - 12.4 fl [facility]   Neutrophils 3.58 1.50 - 7.80 10^3/uL [facility]   Lymphocytes 2.07 1.00 - 3.60 10^3/uL [facility]   Monocytes 0.59 0.00 - 1.50 10^3/uL [facility]   Eosinophils 0.24 0.00 - 0.55 10^3/uL [facility]   Basophils 0.05 0.00 - 0.09 10^3/uL [facility]   Neutrophil % 54.6 32.0 - 70.0 % [facility]   Lymphocyte % 31.6 10.0 - 50.0 % [facility]   Monocyte % 9.0 4.0 - 13.0 % [facility]   Eosinophil % 3.7 1.0 - 5.0 % [facility]   Basophil% 0.8 0.0 - 2.0 % [facility]   Immature Granulocyte % 0.3 <=0.7 % [facility]   Immature Granulocyte Count 0.02 <=0.06 10^3/L [place] - LAB    CBC w/auto Differential (5 Part) (06/05/2017 3:44 PM EST)  Specimen  Blood   CBC w/auto Differential (5 Part) (06/05/2017 3:44 PM EST)  Performing Organization Address City/State/Zipcode Phone Number  [place]  [place], [place] [phone]    Back to top of Lab Results   Visit Diagnoses - documented in this encounter  Diagnosis  Pre-op evaluation - Primary   Pre-operative clearance  Unspecified pre-operative examination   Essential hypertension   Hypothyroidism due to acquired atrophy of thyroid   Type 2 diabetes mellitus  with complication, without long-term current use of insulin , unspecified (CMS-HCC)    Images Document Information  Primary Care Provider Other Service Providers Document Coverage Dates  Stephanie Ochs MD (Nov. 15, 2018 -  Present) [phone] (Work) [phone] (Fax) [place], [place] [address]   Feb. 15, 2019   [place] [address] [place], [place] [address]   Encounter Providers Encounter Date  Stephanie Ochs MD (Attending) [phone] (Work) [phone] (Fax) [place], [place] [address]  Feb. 15, 2019

## 2017-06-08 NOTE — Patient Instructions (Signed)
Your procedure is scheduled on: 06-10-17 Report to Same Day Surgery 2nd floor medical mall [facility] Entrance-take elevator on left to 2nd floor.  Check in with surgery information desk.) To find out your arrival time please call [phone] between 1PM - 3PM on 06-09-17  Remember: Instructions that are not followed completely may result in serious medical risk, up to and including death, or upon the discretion of your surgeon and anesthesiologist your surgery may need to be rescheduled.    _x___ 1. Do not eat food after midnight the night before your procedure. You may drink WATER up to 2 hours before you are scheduled to arrive at the hospital for your procedure.  Do not drink WATER within 2 hours of your scheduled arrival to the hospital.  Type 1 and type 2 diabetics should only drink water.       __x__ 2. No Alcohol for 24 hours before or after surgery.   __x__3. No Smoking or e-cigarettes for 24 prior to surgery.  Do not use any chewable tobacco products for at least 6 hour prior to surgery   ____  4. Bring all medications with you on the day of surgery if instructed.    __x__ 5. Notify your doctor if there is any change in your medical condition     (cold, fever, infections).    x___6. On the morning of surgery brush your teeth with toothpaste and water.  You may rinse your mouth with mouth wash if you wish.  Do not swallow any toothpaste or mouthwash.   Do not wear jewelry, make-up, hairpins, clips or nail polish.  Do not wear lotions, powders, or perfumes. You may wear deodorant.  Do not shave 48 hours prior to surgery. Men may shave face and neck.  Do not bring valuables to the hospital.    [facility] is not responsible for any belongings or valuables.               Contacts, dentures or bridgework may not be worn into surgery.  Leave your suitcase in the car. After surgery it may be brought to your room.  For patients admitted to the hospital, discharge time is  determined by your treatment team.  _  Patients discharged the day of surgery will not be allowed to drive home.  You will need someone to drive you home and stay with you the night of your procedure.    Please read over the following fact sheets that you were given:   [facility] Preparing for Surgery and or MRSA Information   _x___ TAKE THE FOLLOWING MEDICATION THE MORNING OF SURGERY WITH A SMALL SIP OF WATER. These include:  1. LEVOTHYROXINE  2.  3.  4.  5.  6.  ____Fleets enema or Magnesium Citrate as directed.   ____ Use CHG Soap or sage wipes as directed on instruction sheet   ____ Use inhalers on the day of surgery and bring to hospital day of surgery  _X___ Stop Metformin 2 days prior to surgery-STOP NOW-LAST DOSE WAS TODAY (06-08-17 AM DOSE ONLY)    ____ Take 1/2 of usual insulin dose the night before surgery and none on the morning surgery.   _x___ Follow recommendations from Cardiologist, Pulmonologist or PCP regarding stopping Aspirin, Coumadin, Plavix ,Eliquis, Effient, or Pradaxa, and Pletal-PT STOPPED HER ASPIRIN 5 DAYS PRIOR TO SURGERY PER DR CINTRONS ORDER  X____Stop Anti-inflammatories such as Advil, Aleve, Ibuprofen, Motrin, Naproxen, Naprosyn, Goodies powders, EXCEDRIN MIGRAINE or aspirin products  NOW-OK to take Tylenol   _x___ Stop supplements until after surgery-STOP MELATONIN NOW-MAY RESUME AFTER SURGERY   ____ Bring C-Pap to the hospital.

## 2017-06-09 ENCOUNTER — Ambulatory Visit
Admission: RE | Admit: 2017-06-09 | Discharge: 2017-06-09 | Disposition: A | Discharge status: Home / Self Care | Payer: Medicare Other | Source: Ambulatory Visit | Attending: Hematology and Oncology | Admitting: Hematology and Oncology

## 2017-06-09 DIAGNOSIS — C50211 Malignant neoplasm of upper-inner quadrant of right female breast: Secondary | ICD-10-CM

## 2017-06-09 DIAGNOSIS — Z171 Estrogen receptor negative status [ER-]: Secondary | ICD-10-CM | POA: Diagnosis not present

## 2017-06-09 DIAGNOSIS — I340 Nonrheumatic mitral (valve) insufficiency: Secondary | ICD-10-CM | POA: Diagnosis not present

## 2017-06-09 MED ORDER — CEFAZOLIN SODIUM-DEXTROSE 2-4 GM/100ML-% IV SOLN: 2.0000 g | INTRAVENOUS | Status: DC

## 2017-06-09 NOTE — Progress Notes (Signed)
*  PRELIMINARY RESULTS* Echocardiogram 2D Echocardiogram has been performed.  Stephanie Sanders Ajax Schroll 06/09/2017, 10:48 AM

## 2017-06-10 ENCOUNTER — Encounter: Payer: Self-pay | Admitting: Anesthesiology

## 2017-06-10 ENCOUNTER — Ambulatory Visit: Payer: Medicare Other

## 2017-06-10 ENCOUNTER — Encounter
Admission: RE | Disposition: A | Discharge status: Home / Self Care | Payer: Self-pay | Source: Ambulatory Visit | Attending: General Surgery

## 2017-06-10 ENCOUNTER — Ambulatory Visit
Admission: RE | Admit: 2017-06-10 | Discharge: 2017-06-10 | Disposition: A | Discharge status: Home / Self Care | Payer: Medicare Other | Source: Ambulatory Visit | Attending: General Surgery | Admitting: General Surgery

## 2017-06-10 ENCOUNTER — Other Ambulatory Visit: Payer: Self-pay

## 2017-06-10 ENCOUNTER — Ambulatory Visit: Payer: Medicare Other | Admitting: Anesthesiology

## 2017-06-10 DIAGNOSIS — Z8349 Family history of other endocrine, nutritional and metabolic diseases: Secondary | ICD-10-CM | POA: Diagnosis not present

## 2017-06-10 DIAGNOSIS — Z823 Family history of stroke: Secondary | ICD-10-CM | POA: Diagnosis not present

## 2017-06-10 DIAGNOSIS — Z888 Allergy status to other drugs, medicaments and biological substances status: Secondary | ICD-10-CM | POA: Diagnosis not present

## 2017-06-10 DIAGNOSIS — E785 Hyperlipidemia, unspecified: Secondary | ICD-10-CM | POA: Diagnosis not present

## 2017-06-10 DIAGNOSIS — Z7984 Long term (current) use of oral hypoglycemic drugs: Secondary | ICD-10-CM | POA: Diagnosis not present

## 2017-06-10 DIAGNOSIS — E559 Vitamin D deficiency, unspecified: Secondary | ICD-10-CM | POA: Insufficient documentation

## 2017-06-10 DIAGNOSIS — I10 Essential (primary) hypertension: Secondary | ICD-10-CM | POA: Diagnosis not present

## 2017-06-10 DIAGNOSIS — E034 Atrophy of thyroid (acquired): Secondary | ICD-10-CM | POA: Diagnosis not present

## 2017-06-10 DIAGNOSIS — Z8249 Family history of ischemic heart disease and other diseases of the circulatory system: Secondary | ICD-10-CM | POA: Insufficient documentation

## 2017-06-10 DIAGNOSIS — Z7982 Long term (current) use of aspirin: Secondary | ICD-10-CM | POA: Diagnosis not present

## 2017-06-10 DIAGNOSIS — C50911 Malignant neoplasm of unspecified site of right female breast: Secondary | ICD-10-CM | POA: Diagnosis present

## 2017-06-10 DIAGNOSIS — Z9049 Acquired absence of other specified parts of digestive tract: Secondary | ICD-10-CM | POA: Insufficient documentation

## 2017-06-10 DIAGNOSIS — E1121 Type 2 diabetes mellitus with diabetic nephropathy: Secondary | ICD-10-CM | POA: Diagnosis not present

## 2017-06-10 DIAGNOSIS — Z87891 Personal history of nicotine dependence: Secondary | ICD-10-CM | POA: Diagnosis not present

## 2017-06-10 DIAGNOSIS — C773 Secondary and unspecified malignant neoplasm of axilla and upper limb lymph nodes: Secondary | ICD-10-CM | POA: Insufficient documentation

## 2017-06-10 DIAGNOSIS — Z820 Family history of epilepsy and other diseases of the nervous system: Secondary | ICD-10-CM | POA: Diagnosis not present

## 2017-06-10 DIAGNOSIS — Z833 Family history of diabetes mellitus: Secondary | ICD-10-CM | POA: Diagnosis not present

## 2017-06-10 DIAGNOSIS — Z79899 Other long term (current) drug therapy: Secondary | ICD-10-CM | POA: Diagnosis not present

## 2017-06-10 DIAGNOSIS — Z882 Allergy status to sulfonamides status: Secondary | ICD-10-CM | POA: Diagnosis not present

## 2017-06-10 DIAGNOSIS — Z171 Estrogen receptor negative status [ER-]: Secondary | ICD-10-CM | POA: Insufficient documentation

## 2017-06-10 DIAGNOSIS — Z95828 Presence of other vascular implants and grafts: Secondary | ICD-10-CM

## 2017-06-10 HISTORY — PX: PORTACATH PLACEMENT: SHX2246

## 2017-06-10 LAB — GLUCOSE, CAPILLARY: GLUCOSE-CAPILLARY: 164 mg/dL — AB (ref 65–99)

## 2017-06-10 SURGERY — INSERTION, TUNNELED CENTRAL VENOUS DEVICE, WITH PORT
Anesthesia: Monitor Anesthesia Care | Laterality: Right | Wound class: Clean

## 2017-06-10 MED ORDER — FAMOTIDINE 20 MG PO TABS
ORAL_TABLET | ORAL | Status: AC
  Filled 2017-06-10: qty 1

## 2017-06-10 MED ORDER — DEXAMETHASONE SODIUM PHOSPHATE 10 MG/ML IJ SOLN
INTRAMUSCULAR | Status: AC
  Filled 2017-06-10: qty 1

## 2017-06-10 MED ORDER — FENTANYL CITRATE (PF) 100 MCG/2ML IJ SOLN: 25.0000 ug | INTRAMUSCULAR | Status: DC | PRN

## 2017-06-10 MED ORDER — PROPOFOL 500 MG/50ML IV EMUL
INTRAVENOUS | Status: DC | PRN
  Administered 2017-06-10: 50 ug/kg/min via INTRAVENOUS

## 2017-06-10 MED ORDER — DEXMEDETOMIDINE HCL 200 MCG/2ML IV SOLN
INTRAVENOUS | Status: DC | PRN
  Administered 2017-06-10: 12 ug via INTRAVENOUS

## 2017-06-10 MED ORDER — CEFAZOLIN SODIUM-DEXTROSE 2-4 GM/100ML-% IV SOLN
INTRAVENOUS | Status: AC
  Filled 2017-06-10: qty 100

## 2017-06-10 MED ORDER — LACTATED RINGERS IV SOLN
INTRAVENOUS | Status: DC | PRN
  Administered 2017-06-10: 10:00:00 via INTRAVENOUS

## 2017-06-10 MED ORDER — HEPARIN SODIUM (PORCINE) 5000 UNIT/ML IJ SOLN
INTRAMUSCULAR | Status: AC
  Filled 2017-06-10: qty 1

## 2017-06-10 MED ORDER — PROPOFOL 500 MG/50ML IV EMUL
INTRAVENOUS | Status: AC
  Filled 2017-06-10: qty 50

## 2017-06-10 MED ORDER — GLYCOPYRROLATE 0.2 MG/ML IJ SOLN
INTRAMUSCULAR | Status: AC
  Filled 2017-06-10: qty 1

## 2017-06-10 MED ORDER — LIDOCAINE HCL (PF) 1 % IJ SOLN
INTRAMUSCULAR | Status: AC
  Filled 2017-06-10: qty 30

## 2017-06-10 MED ORDER — ONDANSETRON HCL 4 MG/2ML IJ SOLN: 4.0000 mg | Freq: Once | INTRAMUSCULAR | Status: DC | PRN

## 2017-06-10 MED ORDER — HEPARIN SODIUM (PORCINE) 1000 UNIT/ML IJ SOLN
INTRAMUSCULAR | Status: DC | PRN
  Administered 2017-06-10: 20 mL via INTRAMUSCULAR

## 2017-06-10 MED ORDER — MIDAZOLAM HCL 2 MG/2ML IJ SOLN
INTRAMUSCULAR | Status: AC
  Filled 2017-06-10: qty 2

## 2017-06-10 MED ORDER — BUPIVACAINE-EPINEPHRINE (PF) 0.5% -1:200000 IJ SOLN
INTRAMUSCULAR | Status: AC
  Filled 2017-06-10: qty 30

## 2017-06-10 MED ORDER — FENTANYL CITRATE (PF) 100 MCG/2ML IJ SOLN
INTRAMUSCULAR | Status: DC | PRN
  Administered 2017-06-10 (×2): 50 ug via INTRAVENOUS

## 2017-06-10 MED ORDER — CEFAZOLIN SODIUM-DEXTROSE 2-3 GM-%(50ML) IV SOLR
INTRAVENOUS | Status: DC | PRN
  Administered 2017-06-10: 2 g via INTRAVENOUS

## 2017-06-10 MED ORDER — SODIUM CHLORIDE 0.9 % IJ SOLN
INTRAMUSCULAR | Status: AC
  Filled 2017-06-10: qty 50

## 2017-06-10 MED ORDER — GLYCOPYRROLATE 0.2 MG/ML IJ SOLN
INTRAMUSCULAR | Status: DC | PRN
  Administered 2017-06-10: 0.2 mg via INTRAVENOUS

## 2017-06-10 MED ORDER — PROPOFOL 10 MG/ML IV BOLUS
INTRAVENOUS | Status: DC | PRN
  Administered 2017-06-10: 20 mg via INTRAVENOUS

## 2017-06-10 MED ORDER — HYDROCODONE-ACETAMINOPHEN 5-325 MG PO TABS: 1.0000 | ORAL_TABLET | ORAL | 0 refills | Status: AC | PRN

## 2017-06-10 MED ORDER — FENTANYL CITRATE (PF) 100 MCG/2ML IJ SOLN
INTRAMUSCULAR | Status: AC
  Filled 2017-06-10: qty 2

## 2017-06-10 MED ORDER — ONDANSETRON HCL 4 MG/2ML IJ SOLN
INTRAMUSCULAR | Status: AC
  Filled 2017-06-10: qty 2

## 2017-06-10 MED ORDER — BUPIVACAINE-EPINEPHRINE (PF) 0.25% -1:200000 IJ SOLN
INTRAMUSCULAR | Status: DC | PRN
  Administered 2017-06-10: 7 mL via PERINEURAL

## 2017-06-10 MED ORDER — MIDAZOLAM HCL 5 MG/5ML IJ SOLN
INTRAMUSCULAR | Status: DC | PRN
  Administered 2017-06-10 (×2): 1 mg via INTRAVENOUS

## 2017-06-10 SURGICAL SUPPLY — 26 items
CANISTER SUCT 1200ML W/VALVE (MISCELLANEOUS) ×2 IMPLANT
CHLORAPREP W/TINT 26ML (MISCELLANEOUS) ×2 IMPLANT
COVER LIGHT HANDLE STERIS (MISCELLANEOUS) ×4 IMPLANT
COVER TRANSDUCER ULTRASOUND (MISCELLANEOUS) ×2 IMPLANT
DERMABOND ADVANCED (GAUZE/BANDAGES/DRESSINGS) ×1
DERMABOND ADVANCED .7 DNX12 (GAUZE/BANDAGES/DRESSINGS) ×1 IMPLANT
DRAPE C-ARM XRAY 36X54 (DRAPES) ×2 IMPLANT
ELECT REM PT RETURN 9FT ADLT (ELECTROSURGICAL) ×2
ELECTRODE REM PT RTRN 9FT ADLT (ELECTROSURGICAL) ×1 IMPLANT
GLOVE BIO SURGEON STRL SZ 6.5 (GLOVE) ×2 IMPLANT
GOWN STRL REUS W/ TWL LRG LVL3 (GOWN DISPOSABLE) ×3 IMPLANT
GOWN STRL REUS W/TWL LRG LVL3 (GOWN DISPOSABLE) ×3
KIT PORT POWER 8FR ISP CVUE (Miscellaneous) ×2 IMPLANT
KIT TURNOVER KIT A (KITS) ×2 IMPLANT
LABEL OR SOLS (LABEL) ×2 IMPLANT
NEEDLE FILTER BLUNT 18X 1/2SAF (NEEDLE) ×1
NEEDLE FILTER BLUNT 18X1 1/2 (NEEDLE) ×1 IMPLANT
PACK PORT-A-CATH (MISCELLANEOUS) ×2 IMPLANT
SUT MNCRL AB 4-0 PS2 18 (SUTURE) ×2 IMPLANT
SUT SILK 4 0 SH (SUTURE) ×2 IMPLANT
SUT VIC AB 2-0 SH 27 (SUTURE) ×1
SUT VIC AB 2-0 SH 27XBRD (SUTURE) ×1 IMPLANT
SUT VIC AB 3-0 SH 27 (SUTURE) ×1
SUT VIC AB 3-0 SH 27X BRD (SUTURE) ×1 IMPLANT
SYR 10ML LL (SYRINGE) ×2 IMPLANT
SYR 3ML LL SCALE MARK (SYRINGE) ×2 IMPLANT

## 2017-06-10 NOTE — Transfer of Care (Signed)
Immediate Anesthesia Transfer of Care Note  Patient: Stephanie Sanders  Procedure(s) Performed: INSERTION PORT-A-CATH (Right )  Patient Location: PACU  Anesthesia Type:MAC  Level of Consciousness: awake, alert  and oriented  Airway & Oxygen Therapy: Patient Spontanous Breathing  Post-op Assessment: Report given to RN and Post -op Vital signs reviewed and stable  Post vital signs: Reviewed and stable  Last Vitals:  Vitals:   06/10/17 0938  BP: (!) 146/72  Pulse: 77  Resp: 16  Temp: 36.6 C  SpO2: 99%    Last Pain:  Vitals:   06/10/17 0938  TempSrc: Oral         Complications: No apparent anesthesia complications

## 2017-06-10 NOTE — Op Note (Signed)
SURGICAL PROCEDURE REPORT  DATE OF PROCEDURE: 06/10/2017   SURGEON: Dr. Windell Moment   ANESTHESIA: Local with light IV sedation   PRE-OPERATIVE DIAGNOSIS: Advanced breast cancer requiring durable central venous access for chemotherapy   POST-OPERATIVE DIAGNOSIS: Advanced breast cancer requiring durable central venous access for chemotherapy   PROCEDURE(S): (cpt: 36561) 1.) Percutaneous access of Left internal jugular vein under ultrasound guidance 2.) Insertion of tunneled Left internal jugular central venous catheter with subcutaneous port  INTRAOPERATIVE FINDINGS: Patent easily compressible Left internal jugular vein with appropriate respiratory variations and well-secured tunneled central venous catheter with subcutaneous port at completion of the procedure  ESTIMATED BLOOD LOSS: 34m  SPECIMENS: None   IMPLANTS: 57F tunneled Bard PowerPort central venous catheter with subcutaneous port  DRAINS: None   COMPLICATIONS: None apparent   CONDITION AT COMPLETION: Hemodynamically stable, awake   DISPOSITION: PACU   INDICATION(S) FOR PROCEDURE:  Patient is a 72y.o. female who presented with advanced right breast cancer with axillary metastasis found on pre operative ultrasound with ER/PR negative Her2+ positive features requiring durable central venous access for neoadjuvant chemotherapy. All risks, benefits, and alternatives to above elective procedures were discussed with the patient, who elected to proceed, and informed consent was accordingly obtained at that time.  DETAILS OF PROCEDURE:  Patient was brought to the operative suite and appropriately identified. In Trendelenburg position, Left IJ venous access site was prepped and draped in the usual sterile fashion, and following a brief timeout, percutaneous Left IJ venous access was obtained under ultrasound guidance using Seldinger technique, by which local anesthetic was injected over the left IJ vein, and access needle was  inserted under direct ultrasound visualization into the Left IJ vein, through which soft guidewire was advanced, over which access needle was withdrawn. Guidewire was secured, attention was directed to injection of local anesthetic along the planned tunnel site, 2-3 cm transverse Left chest incision was made and confirmed to accommodate the subcutaneous port, and flushed catheter was tunneled retrograde from the port site over the Left chest to the Left IJ access site with the attached port well-secured to the catheter and within the subcutaneous pocket. Insertion sheath was advanced over the guidewire, which was withdrawn along with the insertion sheath dilator. The catheter was introduced through the sheath and left on the [place] junction under fluoro guidance and catheter cut to desire lenght. Catheter connected to port and fixed to the pocket on two side to avoid twisting. Port was confirmed to withdraw blood and flush easily, after which concentrated heparin was instilled into the port and catheter. Dermis at the subcutaneous pocket was re-approximated using buried interrupted 3-0 Vicryl suture, and 4-0 Monocryl suture was used to re-approximate skin at the insertion and subcutaneous port sites in running subcuticular fashion for the subcutaneous port and buried interrupted fashion for the insertion site. Skin was cleaned, dried, and sterile skin glue was applied. Patient was then safely transferred to PACU for a chest x-ray.

## 2017-06-10 NOTE — Anesthesia Postprocedure Evaluation (Signed)
Anesthesia Post Note  Patient: Stephanie Sanders  Procedure(s) Performed: INSERTION PORT-A-CATH (Right )  Patient location during evaluation: PACU Anesthesia Type: MAC Level of consciousness: awake and alert Pain management: pain level controlled Vital Signs Assessment: post-procedure vital signs reviewed and stable Respiratory status: spontaneous breathing, nonlabored ventilation, respiratory function stable and patient connected to nasal cannula oxygen Cardiovascular status: stable and blood pressure returned to baseline Postop Assessment: no apparent nausea or vomiting Anesthetic complications: no     Last Vitals:  Vitals:   06/10/17 1133 06/10/17 1146  BP: (!) 104/52 (!) 117/48  Pulse: 66 65  Resp: 13 14  Temp: (!) 36.4 C   SpO2: 98% 99%    Last Pain:  Vitals:   06/10/17 1133  TempSrc:   PainSc: 0-No pain                 [name]

## 2017-06-10 NOTE — Discharge Instructions (Signed)
°  Diet: Resume home heart healthy regular diet.   Activity: No heavy lifting >20 pounds (children, pets, laundry, garbage) or strenuous activity until follow-up with the left upper extremity, but light activity and walking are encouraged. Do not drive or drink alcohol if taking narcotic pain medications.  Wound care: May shower with soapy water and pat dry (do not rub incisions), but no baths or submerging incision underwater until follow-up. (no swimming)   Medications: Resume all home medications. For mild to moderate pain: acetaminophen (Tylenol) or ibuprofen (if no kidney disease). Combining Tylenol with alcohol can substantially increase your risk of causing liver disease. Narcotic pain medications, if prescribed, can be used for severe pain, though may cause nausea, constipation, and drowsiness. Do not combine Tylenol and Percocet within a 6 hour period as Percocet contains Tylenol. If you do not need the narcotic pain medication, you do not need to fill the prescription.   AMBULATORY SURGERY  DISCHARGE INSTRUCTIONS   1) The drugs that you were given will stay in your system until tomorrow so for the next 24 hours you should not:  A) Drive an automobile B) Make any legal decisions C) Drink any alcoholic beverage   2) You may resume regular meals tomorrow.  Today it is better to start with liquids and gradually work up to solid foods.  You may eat anything you prefer, but it is better to start with liquids, then soup and crackers, and gradually work up to solid foods.   3) Please notify your doctor immediately if you have any unusual bleeding, trouble breathing, redness and pain at the surgery site, drainage, fever, or pain not relieved by medication.    4) Additional Instructions:        Please contact your physician with any problems or Same Day Surgery at [phone], Monday through Friday 6 am to 4 pm, or [place] [facility] number at [phone].  Call  office [phone]) at any time if any questions, worsening pain, fevers/chills, bleeding, drainage from incision site, or other concerns.

## 2017-06-10 NOTE — Anesthesia Post-op Follow-up Note (Signed)
Anesthesia QCDR form completed.        

## 2017-06-10 NOTE — Interval H&P Note (Signed)
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 After consideration of risks, benefits and other options for treatment, the patient has consented to  Procedure(s): INSERTION PORT-A-CATH (Right) as a surgical intervention .  The patient's history has been reviewed, patient examined, no change in status, stable for surgery.  I have reviewed the patient's chart and labs.  Questions were answered to the patient's satisfaction.     Herbert Pun

## 2017-06-10 NOTE — Anesthesia Preprocedure Evaluation (Signed)
Anesthesia Evaluation  Patient identified by MRN, date of birth, ID band Patient awake    Reviewed: Allergy & Precautions, NPO status , Patient's Chart, lab work & pertinent test results, reviewed documented beta blocker date and time   Airway Mallampati: II  TM Distance: >3 FB     Dental  (+) Chipped   Pulmonary former smoker,           Cardiovascular hypertension, Pt. on medications + Valvular Problems/Murmurs      Neuro/Psych  Headaches,    GI/Hepatic   Endo/Other  diabetes, Type 2Hypothyroidism   Renal/GU Renal disease     Musculoskeletal   Abdominal   Peds  Hematology   Anesthesia Other Findings Iliac stent.  Reproductive/Obstetrics                             Anesthesia Physical Anesthesia Plan  ASA: III  Anesthesia Plan: MAC   Post-op Pain Management:    Induction:   PONV Risk Score and Plan:   Airway Management Planned:   Additional Equipment:   Intra-op Plan:   Post-operative Plan:   Informed Consent: I have reviewed the patients History and Physical, chart, labs and discussed the procedure including the risks, benefits and alternatives for the proposed anesthesia with the patient or authorized representative who has indicated his/her understanding and acceptance.     Plan Discussed with: CRNA  Anesthesia Plan Comments:         Anesthesia Quick Evaluation

## 2017-06-11 ENCOUNTER — Encounter: Payer: Self-pay | Admitting: Hematology and Oncology

## 2017-06-11 ENCOUNTER — Inpatient Hospital Stay (HOSPITAL_BASED_OUTPATIENT_CLINIC_OR_DEPARTMENT_OTHER): Payer: Medicare Other | Admitting: Hematology and Oncology

## 2017-06-11 VITALS — BP 137/83 | HR 69 | Temp 97.9°F | Resp 20 | Wt 155.9 lb

## 2017-06-11 DIAGNOSIS — C50411 Malignant neoplasm of upper-outer quadrant of right female breast: Secondary | ICD-10-CM

## 2017-06-11 DIAGNOSIS — C773 Secondary and unspecified malignant neoplasm of axilla and upper limb lymph nodes: Secondary | ICD-10-CM

## 2017-06-11 DIAGNOSIS — Z171 Estrogen receptor negative status [ER-]: Secondary | ICD-10-CM

## 2017-06-11 DIAGNOSIS — C50211 Malignant neoplasm of upper-inner quadrant of right female breast: Secondary | ICD-10-CM

## 2017-06-11 MED ORDER — LIDOCAINE-PRILOCAINE 2.5-2.5 % EX CREA: 1.0000 "application " | TOPICAL_CREAM | CUTANEOUS | 6 refills | Status: DC | PRN

## 2017-06-11 NOTE — Progress Notes (Signed)
Patient here for follow up for h/o breast cancer.  She is accompanied by her daughter in law today. Patient had port placed. Needs rx for EMLA cream.   Patient's daughter would like patient to start on mag glycinate 1 day,  B2 and B-3 combo supplement - 1 day, iodine  50 mg tablet once per day and Protocel. These supplements were recommended by her daughter that lives in [place]. She states that her daughter feels that these supplements are "all she needs to treat her cancer and my daughter thinks I do not need chemotherapy." pt stated, " I told my daughter that these supplements are not FDA approved and I need to do what my doctor has planned."  Patient has not yet started on any of these supplements.

## 2017-06-11 NOTE — Progress Notes (Signed)
[facility] day:  06/11/2017   Chief Complaint: Stephanie Sanders is a 72 y.o. female with multi-focal right breast cancer who is seen for review of interval studies and discussion regarding direction of therapy.  HPI:  The patient was last seen in the medical oncology clinic on 06/04/2017 for initial consultation.  She had multi-focal breast cancer. She had a palpable right axillar node.  Biopsy from 2 breast lesions as well as the axillae revealed invasive mammary carcinoma.  Tumor was ER negative, PR negative and Her 2/neu 2+ by IHC.  FISH was pending.  We discussed neoadjuvant chemotherapy.  We discussed genetic testing given her family history of malignancy.  FISH was negative.  Her2/CEP17 ratio was < 2 with an average Her2 signals/cell of < 4.  CA 15-3 and CA27.29 were normal.  Echo on 06/09/2017 revealed an EF of 60-65%.  She underwent port-a-cath placement by Dr. Windell Moment on 06/10/2017.  CXR revealed no evidence of cardiopulmonary disease.  Symptomatically, she feels alright.  She is still "loopy" from the port placement.  She is interested in taking supplements as recommended by her daughter (Lugotab, B2 and B2 ATP cofactor, magnesium glycinate, and Protocal).   Past Medical History:  Diagnosis Date  . Cancer ([place])   . Diabetes mellitus without complication ([place])   . Diabetic nephropathy ([place])   . Headache    migraines  . Heart murmur    ASYMPTOMATIC  . Hypertension   . Hypothyroidism   . Seborrheic keratosis   . Vitamin D deficiency     Past Surgical History:  Procedure Laterality Date  . APPENDECTOMY    . BREAST BIOPSY Right 05/28/2017   right breast bx 3 areas invasive mamm ca lymph node mets  . BREAST CYST ASPIRATION Bilateral    neg  . CHOLECYSTECTOMY    . COLONOSCOPY WITH PROPOFOL N/A 01/01/2015   Procedure: COLONOSCOPY WITH PROPOFOL;  Surgeon: Josefine Class, MD;  Location: [facility] ENDOSCOPY;  Service: Endoscopy;   Laterality: N/A;  . EYE SURGERY     eyelid  . PORTACATH PLACEMENT Right 06/10/2017   Procedure: INSERTION PORT-A-CATH;  Surgeon: Herbert Pun, MD;  Location: [facility] ORS;  Service: General;  Laterality: Right;  . STENT PLACEMENT ILIAC ([place] [facility])    . TUBAL LIGATION      Family History  Problem Relation Age of Onset  . Cancer Mother   . Cancer Maternal Aunt   . Breast cancer Neg Hx     Social History:  reports that she quit smoking about 45 years ago. Her smoking use included cigarettes. She has a 20.00 pack-year smoking history. she has never used smokeless tobacco. She reports that she does not drink alcohol or use drugs.  She has 3 children (daughters: age 74 and 33; son: age 87).  She is retired.  She worked for the [employer].  Her husband has dementia.  She lives in [place].  The patient is accompanied by her daughter-in-law today.  Allergies:  Allergies  Allergen Reactions  . Fiorinal [Butalbital-Aspirin-Caffeine] Nausea And Vomiting and Other (See Comments)    SEVERE HEADACHE  . Other     STERI-STRIPS-RASH  . Sulfa Antibiotics Other (See Comments)    Burning esophagus and stomach  . Tramadol Nausea And Vomiting    Current Medications: Current Outpatient Medications  Medication Sig Dispense Refill  . aspirin EC 81 MG tablet Take 81 mg by mouth daily.    Marland Kitchen aspirin-acetaminophen-caffeine ([place]) [phone]  MG tablet Take 2 tablets by mouth every 6 (six) hours as needed for headache.    Marland Kitchen atorvastatin (LIPITOR) 10 MG tablet Take 10 mg by mouth every evening.     . Cholecalciferol 2000 UNITS CAPS Take 4,000 Units by mouth daily.     . colesevelam (WELCHOL) 625 MG tablet Take 1,875 mg by mouth every evening.     Marland Kitchen levothyroxine (SYNTHROID, LEVOTHROID) 50 MCG tablet Take 50 mcg by mouth daily before breakfast.    . metFORMIN (GLUCOPHAGE) 1000 MG tablet Take 1,000 mg by mouth 2 (two) times daily.    . quinapril (ACCUPRIL) 5 MG tablet Take 5 mg by mouth every  morning.     Marland Kitchen SODIUM BICARBONATE PO Take 1,300 mg by mouth at bedtime.    Marland Kitchen acetaminophen (TYLENOL) 500 MG tablet Take 1,000 mg by mouth every 6 (six) hours as needed (for headaches.).    Marland Kitchen HYDROcodone-acetaminophen (NORCO) 5-325 MG tablet Take 1 tablet by mouth every 4 (four) hours as needed for up to 3 days for moderate pain. (Patient not taking: Reported on 06/11/2017) 10 tablet 0  . Melatonin 5 MG TABS Take 5 mg by mouth at bedtime as needed (for sleep.).     No current facility-administered medications for this visit.     Review of Systems:  GENERAL:  Feels "fine".  No fevers or sweats.  Weight up 2 pounds. PERFORMANCE STATUS (ECOG):  0 HEENT:  No visual changes, runny nose, sore throat, mouth sores or tenderness. Lungs: No shortness of breath or cough.  No hemoptysis. Cardiac:  No chest pain, palpitations, orthopnea, or PND. GI:  No nausea, vomiting, diarrhea, constipation, melena or hematochezia.  Takes Welchol s/p cholecystectomy. GU:  No urgency, frequency, dysuria, or hematuria. Musculoskeletal:  No back pain.  No joint pain.  No muscle tenderness. Extremities:  No pain or swelling. Skin:  No rashes or skin changes. Neuro:  No headache, numbness or weakness, balance or coordination issues. Endocrine:  No diabetes, thyroid issues, hot flashes or night sweats. Psych:  Stress.  No depression or anxiety. Pain:  No focal pain. Review of systems:  All other systems reviewed and found to be negative.  Physical Exam: Blood pressure 137/83, pulse 69, temperature 97.9 F (36.6 C), temperature source Tympanic, resp. rate 20, weight 155 lb 14.4 oz (70.7 kg). GENERAL:  Well developed, well nourished, woman sitting comfortably in the exam room in no acute distress. MENTAL STATUS:  Alert and oriented to person, place and time. HEAD:  Short gray hair.  Normocephalic, atraumatic, face symmetric, no Cushingoid features. EYES:  Glasses.  Blue eyes.  No conjunctivitis or scleral  icterus. EXTREMITIES: No edema, no skin discoloration or tenderness.   NEUROLOGICAL: Unremarkable. PSYCH:  Appropriate.   Admission on 06/10/2017, Discharged on 06/10/2017  Component Date Value Ref Range Status  . Glucose-Capillary 06/10/2017 164* 65 - 99 mg/dL Final    Assessment:  Stephanie Sanders is a 72 y.o. 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  Pathology revealed two grade II invasive mammary carcinomas of no special type (4 cm from the nipple and 5 cm from the nipple) approximately 2.4 cm apart.  Lymph node biopsy confirmed metastatic carcinoma of breast origin.  Tumor is ER negative (< 1%), PR negative and Her 2/neu 2+ by IHC.  Right sided mammogram and ultrasound on 05/21/2017 revealed 2 highly suspicious masses in the right breast at the 10:30 position 4 cm from nipple (  1.8 x 1.2 x 1.1 cm) and at the 10:30 position 5 cm from nipple (1.1 x 0.9 x 0.9 cm).  There was a suspicious 2.7 x 1 x 2.2 cm lymph node in the right axilla with asymmetric nodular cortical thickening.  Ultrasound guided biopsy of the 2 masses and lymph node on 05/28/2017 revealed grade II invasive mammary carcinoma of no special type (4 cm from the nipple and 5 cm from the nipple).  Lymph node biopsy revealed metastatic carcinoma of breast origin.  Tumor is ER negative (< 1%), PR negative and Her 2/neu 2+ by IHC.  FISH was negative.  CA 27.29 was 16.5 and CA15-3 was 17.6 on 06/04/2017.  Echo on 06/09/2017 revealed an EF of 60-65%.  Symptomatically, she feels "ok".  Exam reveals right breast ecchymosis s/p biopsy with fullness in the upper outer quadrant.  She has a palpable right axillary lymph node.  Plan: 1.  Review interval labs, FISH, and echocardiogram. 2.  Discuss plan for neoadjuvant chemotherapy.  Discuss AC every 2-3 weeks x 4.  Side effects reviewed.  Discuss Neulasta.  Discuss follow-up ultrasound after AC.  Discuss 12 weeks of Taxol +/-  carboplatin if tolerated.  Side effects reviewed.  Discuss lumpectomy and SLN biopsy followed by radiation.  Patient's daughter does not think she needs chemotherapy.  Discuss [facility] opinion at [facility].  Patient declines.  Multiple questions were asked and answered. 3.  [facility] genetic testing today. 4.  Schedule chemotherapy teaching. 5.  Discuss supplements.  No data to support benefit.  Medications to be reviewed by pharmacy.  Suggest only taking B supplement. 6.  Preauth AC x 4 followed by weekly Taxol x 12 +/- carboplatin. 7.  RTC in 1 week for MD assessment, labs (CBC with diff, CMP, Mg), and cycle #1 AC with Neulasta support.   Nolon Stalls, MD 06/11/2017,3:41 PM

## 2017-06-12 NOTE — Patient Instructions (Signed)
Doxorubicin injection What is this medicine? DOXORUBICIN (dox oh ROO bi sin) is a chemotherapy drug. It is used to treat many kinds of cancer like leukemia, lymphoma, neuroblastoma, sarcoma, and Wilms' tumor. It is also used to treat bladder cancer, breast cancer, lung cancer, ovarian cancer, stomach cancer, and thyroid cancer. This medicine may be used for other purposes; ask your health care provider or pharmacist if you have questions. COMMON BRAND NAME(S): Adriamycin, Adriamycin PFS, Adriamycin RDF, Rubex What should I tell my health care provider before I take this medicine? They need to know if you have any of these conditions: -heart disease -history of low blood counts caused by a medicine -liver disease -recent or ongoing radiation therapy -an unusual or allergic reaction to doxorubicin, other chemotherapy agents, other medicines, foods, dyes, or preservatives -pregnant or trying to get pregnant -breast-feeding How should I use this medicine? This drug is given as an infusion into a vein. It is administered in a hospital or clinic by a specially trained health care professional. If you have pain, swelling, burning or any unusual feeling around the site of your injection, tell your health care professional right away. Talk to your pediatrician regarding the use of this medicine in children. Special care may be needed. Overdosage: If you think you have taken too much of this medicine contact a poison control center or emergency room at once. NOTE: This medicine is only for you. Do not share this medicine with others. What if I miss a dose? It is important not to miss your dose. Call your doctor or health care professional if you are unable to keep an appointment. What may interact with this medicine? This medicine may interact with the following medications: -6-mercaptopurine -paclitaxel -phenytoin -St. John's Wort -trastuzumab -verapamil This list may not describe all possible  interactions. Give your health care provider a list of all the medicines, herbs, non-prescription drugs, or dietary supplements you use. Also tell them if you smoke, drink alcohol, or use illegal drugs. Some items may interact with your medicine. What should I watch for while using this medicine? This drug may make you feel generally unwell. This is not uncommon, as chemotherapy can affect healthy cells as well as cancer cells. Report any side effects. Continue your course of treatment even though you feel ill unless your doctor tells you to stop. There is a maximum amount of this medicine you should receive throughout your life. The amount depends on the medical condition being treated and your overall health. Your doctor will watch how much of this medicine you receive in your lifetime. Tell your doctor if you have taken this medicine before. You may need blood work done while you are taking this medicine. Your urine may turn red for a few days after your dose. This is not blood. If your urine is dark or brown, call your doctor. In some cases, you may be given additional medicines to help with side effects. Follow all directions for their use. Call your doctor or health care professional for advice if you get a fever, chills or sore throat, or other symptoms of a cold or flu. Do not treat yourself. This drug decreases your body's ability to fight infections. Try to avoid being around people who are sick. This medicine may increase your risk to bruise or bleed. Call your doctor or health care professional if you notice any unusual bleeding. Talk to your doctor about your risk of cancer. You may be more at risk for certain   types of cancers if you take this medicine. Do not become pregnant while taking this medicine or for 6 months after stopping it. Women should inform their doctor if they wish to become pregnant or think they might be pregnant. Men should not father a child while taking this medicine and  for 6 months after stopping it. There is a potential for serious side effects to an unborn child. Talk to your health care professional or pharmacist for more information. Do not breast-feed an infant while taking this medicine. This medicine has caused ovarian failure in some women and reduced sperm counts in some men This medicine may interfere with the ability to have a child. Talk with your doctor or health care professional if you are concerned about your fertility. What side effects may I notice from receiving this medicine? Side effects that you should report to your doctor or health care professional as soon as possible: -allergic reactions like skin rash, itching or hives, swelling of the face, lips, or tongue -breathing problems -chest pain -fast or irregular heartbeat -low blood counts - this medicine may decrease the number of white blood cells, red blood cells and platelets. You may be at increased risk for infections and bleeding. -pain, redness, or irritation at site where injected -signs of infection - fever or chills, cough, sore throat, pain or difficulty passing urine -signs of decreased platelets or bleeding - bruising, pinpoint red spots on the skin, black, tarry stools, blood in the urine -swelling of the ankles, feet, hands -tiredness -weakness Side effects that usually do not require medical attention (report to your doctor or health care professional if they continue or are bothersome): -diarrhea -hair loss -mouth sores -nail discoloration or damage -nausea -red colored urine -vomiting This list may not describe all possible side effects. Call your doctor for medical advice about side effects. You may report side effects to FDA at 1-800-FDA-1088. Where should I keep my medicine? This drug is given in a hospital or clinic and will not be stored at home. NOTE: This sheet is a summary. It may not cover all possible information. If you have questions about this  medicine, talk to your doctor, pharmacist, or health care provider.  2018 [employer]/Gold Standard (2015-06-04 11:28:51) Cyclophosphamide injection What is this medicine? CYCLOPHOSPHAMIDE (sye kloe FOSS fa mide) is a chemotherapy drug. It slows the growth of cancer cells. This medicine is used to treat many types of cancer like lymphoma, myeloma, leukemia, breast cancer, and ovarian cancer, to name a few. This medicine may be used for other purposes; ask your health care provider or pharmacist if you have questions. COMMON BRAND NAME(S): Cytoxan, Neosar What should I tell my health care provider before I take this medicine? They need to know if you have any of these conditions: -blood disorders -history of other chemotherapy -infection -kidney disease -liver disease -recent or ongoing radiation therapy -tumors in the bone marrow -an unusual or allergic reaction to cyclophosphamide, other chemotherapy, other medicines, foods, dyes, or preservatives -pregnant or trying to get pregnant -breast-feeding How should I use this medicine? This drug is usually given as an injection into a vein or muscle or by infusion into a vein. It is administered in a hospital or clinic by a specially trained health care professional. Talk to your pediatrician regarding the use of this medicine in children. Special care may be needed. Overdosage: If you think you have taken too much of this medicine contact a poison control center or emergency room at   once. NOTE: This medicine is only for you. Do not share this medicine with others. What if I miss a dose? It is important not to miss your dose. Call your doctor or health care professional if you are unable to keep an appointment. What may interact with this medicine? This medicine may interact with the following medications: -amiodarone -amphotericin B -azathioprine -certain antiviral medicines for HIV or AIDS such as protease inhibitors (e.g., indinavir,  ritonavir) and zidovudine -certain blood pressure medications such as benazepril, captopril, enalapril, fosinopril, lisinopril, moexipril, monopril, perindopril, quinapril, ramipril, trandolapril -certain cancer medications such as anthracyclines (e.g., daunorubicin, doxorubicin), busulfan, cytarabine, paclitaxel, pentostatin, tamoxifen, trastuzumab -certain diuretics such as chlorothiazide, chlorthalidone, hydrochlorothiazide, indapamide, metolazone -certain medicines that treat or prevent blood clots like warfarin -certain muscle relaxants such as succinylcholine -cyclosporine -etanercept -indomethacin -medicines to increase blood counts like filgrastim, pegfilgrastim, sargramostim -medicines used as general anesthesia -metronidazole -natalizumab This list may not describe all possible interactions. Give your health care provider a list of all the medicines, herbs, non-prescription drugs, or dietary supplements you use. Also tell them if you smoke, drink alcohol, or use illegal drugs. Some items may interact with your medicine. What should I watch for while using this medicine? Visit your doctor for checks on your progress. This drug may make you feel generally unwell. This is not uncommon, as chemotherapy can affect healthy cells as well as cancer cells. Report any side effects. Continue your course of treatment even though you feel ill unless your doctor tells you to stop. Drink water or other fluids as directed. Urinate often, even at night. In some cases, you may be given additional medicines to help with side effects. Follow all directions for their use. Call your doctor or health care professional for advice if you get a fever, chills or sore throat, or other symptoms of a cold or flu. Do not treat yourself. This drug decreases your body's ability to fight infections. Try to avoid being around people who are sick. This medicine may increase your risk to bruise or bleed. Call your doctor or  health care professional if you notice any unusual bleeding. Be careful brushing and flossing your teeth or using a toothpick because you may get an infection or bleed more easily. If you have any dental work done, tell your dentist you are receiving this medicine. You may get drowsy or dizzy. Do not drive, use machinery, or do anything that needs mental alertness until you know how this medicine affects you. Do not become pregnant while taking this medicine or for 1 year after stopping it. Women should inform their doctor if they wish to become pregnant or think they might be pregnant. Men should not father a child while taking this medicine and for 4 months after stopping it. There is a potential for serious side effects to an unborn child. Talk to your health care professional or pharmacist for more information. Do not breast-feed an infant while taking this medicine. This medicine may interfere with the ability to have a child. This medicine has caused ovarian failure in some women. This medicine has caused reduced sperm counts in some men. You should talk with your doctor or health care professional if you are concerned about your fertility. If you are going to have surgery, tell your doctor or health care professional that you have taken this medicine. What side effects may I notice from receiving this medicine? Side effects that you should report to your doctor or health care professional as   soon as possible: -allergic reactions like skin rash, itching or hives, swelling of the face, lips, or tongue -low blood counts - this medicine may decrease the number of white blood cells, red blood cells and platelets. You may be at increased risk for infections and bleeding. -signs of infection - fever or chills, cough, sore throat, pain or difficulty passing urine -signs of decreased platelets or bleeding - bruising, pinpoint red spots on the skin, black, tarry stools, blood in the urine -signs of  decreased red blood cells - unusually weak or tired, fainting spells, lightheadedness -breathing problems -dark urine -dizziness -palpitations -swelling of the ankles, feet, hands -trouble passing urine or change in the amount of urine -weight gain -yellowing of the eyes or skin Side effects that usually do not require medical attention (report to your doctor or health care professional if they continue or are bothersome): -changes in nail or skin color -hair loss -missed menstrual periods -mouth sores -nausea, vomiting This list may not describe all possible side effects. Call your doctor for medical advice about side effects. You may report side effects to FDA at 1-800-FDA-1088. Where should I keep my medicine? This drug is given in a hospital or clinic and will not be stored at home. NOTE: This sheet is a summary. It may not cover all possible information. If you have questions about this medicine, talk to your doctor, pharmacist, or health care provider.  2018 [employer]/Gold Standard (2012-02-20 16:22:58) Paclitaxel injection What is this medicine? PACLITAXEL (PAK li TAX el) is a chemotherapy drug. It targets fast dividing cells, like cancer cells, and causes these cells to die. This medicine is used to treat ovarian cancer, breast cancer, and other cancers. This medicine may be used for other purposes; ask your health care provider or pharmacist if you have questions. COMMON BRAND NAME(S): Onxol, Taxol What should I tell my health care provider before I take this medicine? They need to know if you have any of these conditions: -blood disorders -irregular heartbeat -infection (especially a virus infection such as chickenpox, cold sores, or herpes) -liver disease -previous or ongoing radiation therapy -an unusual or allergic reaction to paclitaxel, alcohol, polyoxyethylated castor oil, other chemotherapy agents, other medicines, foods, dyes, or preservatives -pregnant or trying to  get pregnant -breast-feeding How should I use this medicine? This drug is given as an infusion into a vein. It is administered in a hospital or clinic by a specially trained health care professional. Talk to your pediatrician regarding the use of this medicine in children. Special care may be needed. Overdosage: If you think you have taken too much of this medicine contact a poison control center or emergency room at once. NOTE: This medicine is only for you. Do not share this medicine with others. What if I miss a dose? It is important not to miss your dose. Call your doctor or health care professional if you are unable to keep an appointment. What may interact with this medicine? Do not take this medicine with any of the following medications: -disulfiram -metronidazole This medicine may also interact with the following medications: -cyclosporine -diazepam -ketoconazole -medicines to increase blood counts like filgrastim, pegfilgrastim, sargramostim -other chemotherapy drugs like cisplatin, doxorubicin, epirubicin, etoposide, teniposide, vincristine -quinidine -testosterone -vaccines -verapamil Talk to your doctor or health care professional before taking any of these medicines: -acetaminophen -aspirin -ibuprofen -ketoprofen -naproxen This list may not describe all possible interactions. Give your health care provider a list of all the medicines, herbs, non-prescription drugs, or   dietary supplements you use. Also tell them if you smoke, drink alcohol, or use illegal drugs. Some items may interact with your medicine. What should I watch for while using this medicine? Your condition will be monitored carefully while you are receiving this medicine. You will need important blood work done while you are taking this medicine. This medicine can cause serious allergic reactions. To reduce your risk you will need to take other medicine(s) before treatment with this medicine. If you  experience allergic reactions like skin rash, itching or hives, swelling of the face, lips, or tongue, tell your doctor or health care professional right away. In some cases, you may be given additional medicines to help with side effects. Follow all directions for their use. This drug may make you feel generally unwell. This is not uncommon, as chemotherapy can affect healthy cells as well as cancer cells. Report any side effects. Continue your course of treatment even though you feel ill unless your doctor tells you to stop. Call your doctor or health care professional for advice if you get a fever, chills or sore throat, or other symptoms of a cold or flu. Do not treat yourself. This drug decreases your body's ability to fight infections. Try to avoid being around people who are sick. This medicine may increase your risk to bruise or bleed. Call your doctor or health care professional if you notice any unusual bleeding. Be careful brushing and flossing your teeth or using a toothpick because you may get an infection or bleed more easily. If you have any dental work done, tell your dentist you are receiving this medicine. Avoid taking products that contain aspirin, acetaminophen, ibuprofen, naproxen, or ketoprofen unless instructed by your doctor. These medicines may hide a fever. Do not become pregnant while taking this medicine. Women should inform their doctor if they wish to become pregnant or think they might be pregnant. There is a potential for serious side effects to an unborn child. Talk to your health care professional or pharmacist for more information. Do not breast-feed an infant while taking this medicine. Men are advised not to father a child while receiving this medicine. This product may contain alcohol. Ask your pharmacist or healthcare provider if this medicine contains alcohol. Be sure to tell all healthcare providers you are taking this medicine. Certain medicines, like metronidazole  and disulfiram, can cause an unpleasant reaction when taken with alcohol. The reaction includes flushing, headache, nausea, vomiting, sweating, and increased thirst. The reaction can last from 30 minutes to several hours. What side effects may I notice from receiving this medicine? Side effects that you should report to your doctor or health care professional as soon as possible: -allergic reactions like skin rash, itching or hives, swelling of the face, lips, or tongue -low blood counts - This drug may decrease the number of white blood cells, red blood cells and platelets. You may be at increased risk for infections and bleeding. -signs of infection - fever or chills, cough, sore throat, pain or difficulty passing urine -signs of decreased platelets or bleeding - bruising, pinpoint red spots on the skin, black, tarry stools, nosebleeds -signs of decreased red blood cells - unusually weak or tired, fainting spells, lightheadedness -breathing problems -chest pain -high or low blood pressure -mouth sores -nausea and vomiting -pain, swelling, redness or irritation at the injection site -pain, tingling, numbness in the hands or feet -slow or irregular heartbeat -swelling of the ankle, feet, hands Side effects that usually do not   require medical attention (report to your doctor or health care professional if they continue or are bothersome): -bone pain -complete hair loss including hair on your head, underarms, pubic hair, eyebrows, and eyelashes -changes in the color of fingernails -diarrhea -loosening of the fingernails -loss of appetite -muscle or joint pain -red flush to skin -sweating This list may not describe all possible side effects. Call your doctor for medical advice about side effects. You may report side effects to FDA at 1-800-FDA-1088. Where should I keep my medicine? This drug is given in a hospital or clinic and will not be stored at home. NOTE: This sheet is a summary. It  may not cover all possible information. If you have questions about this medicine, talk to your doctor, pharmacist, or health care provider.  2018 [employer]/Gold Standard (2015-02-06 19:58:00)  

## 2017-06-15 ENCOUNTER — Inpatient Hospital Stay: Payer: Medicare Other

## 2017-06-15 ENCOUNTER — Encounter: Payer: Self-pay | Admitting: *Deleted

## 2017-06-15 DIAGNOSIS — C50211 Malignant neoplasm of upper-inner quadrant of right female breast: Secondary | ICD-10-CM

## 2017-06-15 DIAGNOSIS — Z171 Estrogen receptor negative status [ER-]: Principal | ICD-10-CM

## 2017-06-16 ENCOUNTER — Other Ambulatory Visit: Payer: Self-pay | Admitting: Hematology and Oncology

## 2017-06-16 DIAGNOSIS — Z171 Estrogen receptor negative status [ER-]: Principal | ICD-10-CM

## 2017-06-16 DIAGNOSIS — Z006 Encounter for examination for normal comparison and control in clinical research program: Secondary | ICD-10-CM

## 2017-06-16 DIAGNOSIS — C50211 Malignant neoplasm of upper-inner quadrant of right female breast: Secondary | ICD-10-CM

## 2017-06-16 MED ORDER — LORAZEPAM 0.5 MG PO TABS: 0.5000 mg | ORAL_TABLET | Freq: Four times a day (QID) | ORAL | 0 refills | Status: DC | PRN

## 2017-06-16 MED ORDER — ONDANSETRON HCL 8 MG PO TABS: 8.0000 mg | ORAL_TABLET | Freq: Two times a day (BID) | ORAL | 1 refills | Status: DC | PRN

## 2017-06-16 MED ORDER — DEXAMETHASONE 4 MG PO TABS: ORAL_TABLET | ORAL | 1 refills | Status: DC

## 2017-06-16 NOTE — Progress Notes (Signed)
START ON PATHWAY REGIMEN - Breast     A cycle is every 14 days (cycles 1-4):     Doxorubicin      Cyclophosphamide      Pegfilgrastim-xxxx    A cycle is every 21 days (cycles 5-8):     Paclitaxel      Carboplatin   **Always confirm dose/schedule in your pharmacy ordering system**    Patient Characteristics: Preoperative or Nonsurgical Candidate (Clinical Staging), Neoadjuvant Therapy followed by Surgery, Invasive Disease, Chemotherapy, HER2 Negative/Unknown/Equivocal, ER Negative/Unknown, Platinum Therapy Indicated Therapeutic Status: Preoperative or Nonsurgical Candidate (Clinical Staging) AJCC M Category: cM0 AJCC Grade: G2 Breast Surgical Plan: Neoadjuvant Therapy followed by Surgery ER Status: Negative (-) AJCC 8 Stage Grouping: IIB HER2 Status: Negative (-) AJCC T Category: cT1c AJCC N Category: cN1 PR Status: Negative (-) Type of Therapy: Platinum Therapy Indicated Intent of Therapy: Curative Intent, Discussed with Patient

## 2017-06-17 ENCOUNTER — Other Ambulatory Visit: Payer: Self-pay | Admitting: Hematology and Oncology

## 2017-06-17 ENCOUNTER — Encounter: Payer: Self-pay | Admitting: *Deleted

## 2017-06-17 ENCOUNTER — Telehealth: Payer: Self-pay | Admitting: *Deleted

## 2017-06-17 ENCOUNTER — Ambulatory Visit (HOSPITAL_COMMUNITY)
Admission: RE | Admit: 2017-06-17 | Discharge: 2017-06-17 | Disposition: A | Discharge status: Home / Self Care | Payer: Medicare Other | Source: Ambulatory Visit | Attending: Hematology and Oncology | Admitting: Hematology and Oncology

## 2017-06-17 DIAGNOSIS — C50211 Malignant neoplasm of upper-inner quadrant of right female breast: Secondary | ICD-10-CM

## 2017-06-17 DIAGNOSIS — Z006 Encounter for examination for normal comparison and control in clinical research program: Secondary | ICD-10-CM

## 2017-06-17 NOTE — Telephone Encounter (Signed)
-----   Message from Lequita Asal, MD sent at 06/16/2017 10:39 AM EST ----- Regarding: Please let patient know...  Nausea medications sent to pharmacy.  Please pick up and bring to appointment to review.  Do not take.  M

## 2017-06-17 NOTE — Telephone Encounter (Signed)
Called patient and LVM that nausea medication has been called to pharmacy.  Instructed patient to pick it up and bring with her to her appointment this week.  Instructed her not to take it until she sees MD.

## 2017-06-17 NOTE — Telephone Encounter (Addendum)
Per Gaspar Bidding "Verbal" to change 06/19/17 date for lab/MD/*NEW* Cycle #1 AC to 06/18/17. I wasn't sure if she was made aware already but, I called and Message was left on patient's vmail.

## 2017-06-18 ENCOUNTER — Inpatient Hospital Stay: Payer: Medicare Other

## 2017-06-18 ENCOUNTER — Encounter: Payer: Self-pay | Admitting: Hematology and Oncology

## 2017-06-18 ENCOUNTER — Encounter: Payer: Self-pay | Admitting: *Deleted

## 2017-06-18 ENCOUNTER — Inpatient Hospital Stay (HOSPITAL_BASED_OUTPATIENT_CLINIC_OR_DEPARTMENT_OTHER): Payer: Medicare Other | Admitting: Hematology and Oncology

## 2017-06-18 VITALS — BP 145/82 | HR 71 | Temp 98.8°F | Resp 18 | Wt 153.4 lb

## 2017-06-18 DIAGNOSIS — Z95828 Presence of other vascular implants and grafts: Secondary | ICD-10-CM

## 2017-06-18 DIAGNOSIS — R197 Diarrhea, unspecified: Secondary | ICD-10-CM

## 2017-06-18 DIAGNOSIS — Z8041 Family history of malignant neoplasm of ovary: Secondary | ICD-10-CM

## 2017-06-18 DIAGNOSIS — Z800 Family history of malignant neoplasm of digestive organs: Secondary | ICD-10-CM

## 2017-06-18 DIAGNOSIS — Z006 Encounter for examination for normal comparison and control in clinical research program: Secondary | ICD-10-CM

## 2017-06-18 DIAGNOSIS — C50211 Malignant neoplasm of upper-inner quadrant of right female breast: Secondary | ICD-10-CM

## 2017-06-18 DIAGNOSIS — C773 Secondary and unspecified malignant neoplasm of axilla and upper limb lymph nodes: Secondary | ICD-10-CM

## 2017-06-18 DIAGNOSIS — C50411 Malignant neoplasm of upper-outer quadrant of right female breast: Secondary | ICD-10-CM | POA: Diagnosis not present

## 2017-06-18 DIAGNOSIS — Z801 Family history of malignant neoplasm of trachea, bronchus and lung: Secondary | ICD-10-CM

## 2017-06-18 DIAGNOSIS — Z5111 Encounter for antineoplastic chemotherapy: Secondary | ICD-10-CM | POA: Insufficient documentation

## 2017-06-18 DIAGNOSIS — Z171 Estrogen receptor negative status [ER-]: Secondary | ICD-10-CM

## 2017-06-18 LAB — CBC WITH DIFFERENTIAL/PLATELET
Basophils Absolute: 0 10*3/uL (ref 0–0.1)
Basophils Relative: 1 %
Eosinophils Absolute: 0.1 10*3/uL (ref 0–0.7)
Eosinophils Relative: 4 %
HCT: 39.3 % (ref 35.0–47.0)
Hemoglobin: 13.7 g/dL (ref 12.0–16.0)
Lymphocytes Relative: 22 %
Lymphs Abs: 0.9 10*3/uL — ABNORMAL LOW (ref 1.0–3.6)
MCH: 31.2 pg (ref 26.0–34.0)
MCHC: 34.9 g/dL (ref 32.0–36.0)
MCV: 89.4 fL (ref 80.0–100.0)
Monocytes Absolute: 0.4 10*3/uL (ref 0.2–0.9)
Monocytes Relative: 10 %
Neutro Abs: 2.4 10*3/uL (ref 1.4–6.5)
Neutrophils Relative %: 63 %
Platelets: 153 10*3/uL (ref 150–440)
RBC: 4.4 MIL/uL (ref 3.80–5.20)
RDW: 13.6 % (ref 11.5–14.5)
WBC: 3.9 10*3/uL (ref 3.6–11.0)

## 2017-06-18 LAB — COMPREHENSIVE METABOLIC PANEL
ALT: 22 U/L (ref 14–54)
AST: 24 U/L (ref 15–41)
Albumin: 4.3 g/dL (ref 3.5–5.0)
Alkaline Phosphatase: 42 U/L (ref 38–126)
Anion gap: 9 (ref 5–15)
BUN: 11 mg/dL (ref 6–20)
CO2: 25 mmol/L (ref 22–32)
Calcium: 9.4 mg/dL (ref 8.9–10.3)
Chloride: 104 mmol/L (ref 101–111)
Creatinine, Ser: 0.81 mg/dL (ref 0.44–1.00)
GFR calc Af Amer: >60 mL/min (ref >60)
GFR calc non Af Amer: >60 mL/min (ref >60)
Glucose, Bld: 154 mg/dL — ABNORMAL HIGH (ref 65–99)
Potassium: 3.7 mmol/L (ref 3.5–5.1)
Sodium: 138 mmol/L (ref 135–145)
Total Bilirubin: 1 mg/dL (ref 0.3–1.2)
Total Protein: 7.2 g/dL (ref 6.5–8.1)

## 2017-06-18 LAB — MAGNESIUM: Magnesium: 2.1 mg/dL (ref 1.7–2.4)

## 2017-06-18 MED ORDER — SODIUM CHLORIDE 0.9% FLUSH
10.0000 mL | INTRAVENOUS | Status: DC | PRN
  Administered 2017-06-18: 10 mL via INTRAVENOUS
  Filled 2017-06-18: qty 10

## 2017-06-18 MED ORDER — HEPARIN SOD (PORK) LOCK FLUSH 100 UNIT/ML IV SOLN
500.0000 [IU] | Freq: Once | INTRAVENOUS | Status: AC
  Administered 2017-06-18: 500 [IU] via INTRAVENOUS
  Filled 2017-06-18: qty 5

## 2017-06-18 NOTE — Progress Notes (Signed)
Patient states she is having diarrhea in the mornings.  States she will go 4-5 times in the morning until it runs it course.  She feels like it may be because of Augmentin she was on.  Had MRI yesterday.

## 2017-06-18 NOTE — Progress Notes (Signed)
Stephanie Sanders day:  06/18/2017   Chief Complaint: Stephanie Sanders is a 72 y.o. female with multi-focal right breast cancer who is seen for assessment prior to cycle #1 AC.  HPI:  The patient was last seen in the medical oncology [facility] on 06/11/2017.  At that time, she felt "ok".  She was recovering from port-a-cath placement. We discussed plans for initiation of neoadjuvant chemotherapy.  She attended chemotherapy class.  She spoke with clinical trials about enrollment on the UPBEAT trial. Patient has consented to trial inclusion.   During the interim, patient has been having diarrhea and peri-anal itching that has been going on for the last 3 days. Patient has several episodes of diarrhea in the morning. Patient was started on Augmentin on Saturday, 06/13/2017, by her surgeon secondary to her port-a-cath.  Patient notes that she is "really tired".    Past Medical History:  Diagnosis Date  . Cancer ([place])   . Diabetes mellitus without complication ([place])   . Diabetic nephropathy ([place])   . Headache    migraines  . Heart murmur    ASYMPTOMATIC  . Hypertension   . Hypothyroidism   . Seborrheic keratosis   . Vitamin D deficiency     Past Surgical History:  Procedure Laterality Date  . APPENDECTOMY    . BREAST BIOPSY Right 05/28/2017   right breast bx 3 areas invasive mamm ca lymph node mets  . BREAST CYST ASPIRATION Bilateral    neg  . CHOLECYSTECTOMY    . COLONOSCOPY WITH PROPOFOL N/A 01/01/2015   Procedure: COLONOSCOPY WITH PROPOFOL;  Surgeon: Josefine Class, MD;  Location: [facility] ENDOSCOPY;  Service: Endoscopy;  Laterality: N/A;  . EYE SURGERY     eyelid  . PORTACATH PLACEMENT Right 06/10/2017   Procedure: INSERTION PORT-A-CATH;  Surgeon: Herbert Pun, MD;  Location: [facility] ORS;  Service: General;  Laterality: Right;  . STENT PLACEMENT ILIAC ([place] HX)    . TUBAL LIGATION      Family History  Problem Relation Age of Onset   . Cancer Mother   . Cancer Maternal Aunt   . Breast cancer Neg Hx     Social History:  reports that she quit smoking about 45 years ago. Her smoking use included cigarettes. She has a 20.00 pack-year smoking history. she has never used smokeless tobacco. She reports that she does not drink alcohol or use drugs.  She has 3 children (daughters: age 53 and 75; son: age 16).  She is retired.  She worked for the [employer].  Her husband has dementia.  She lives in [place].  The patient is accompanied by Yolande Jolly (clinical trials nurse) today.  Allergies:  Allergies  Allergen Reactions  . Fiorinal [Butalbital-Aspirin-Caffeine] Nausea And Vomiting and Other (See Comments)    SEVERE HEADACHE  . Other     STERI-STRIPS-RASH  . Sulfa Antibiotics Other (See Comments)    Burning esophagus and stomach  . Tramadol Nausea And Vomiting    Current Medications: Current Outpatient Medications  Medication Sig Dispense Refill  . acetaminophen (TYLENOL) 500 MG tablet Take 1,000 mg by mouth every 6 (six) hours as needed (for headaches.).    Marland Kitchen aspirin EC 81 MG tablet Take 81 mg by mouth daily.    Marland Kitchen aspirin-acetaminophen-caffeine (EXCEDRIN MIGRAINE) 250-250-65 MG tablet Take 2 tablets by mouth every 6 (six) hours as needed for headache.    Marland Kitchen atorvastatin (LIPITOR) 10 MG tablet Take 10 mg by mouth every  evening.     . Cholecalciferol 2000 UNITS CAPS Take 4,000 Units by mouth daily.     . colesevelam (WELCHOL) 625 MG tablet Take 1,875 mg by mouth every evening.     Marland Kitchen levothyroxine (SYNTHROID, LEVOTHROID) 50 MCG tablet Take 50 mcg by mouth daily before breakfast.    . lidocaine-prilocaine (EMLA) cream Apply 1 application topically as needed. 30 g 6  . LORazepam (ATIVAN) 0.5 MG tablet Take 1 tablet (0.5 mg total) by mouth every 6 (six) hours as needed (Nausea or vomiting). 30 tablet 0  . Melatonin 5 MG TABS Take 5 mg by mouth at bedtime as needed (for sleep.).    Marland Kitchen metFORMIN (GLUCOPHAGE) 1000 MG tablet Take  1,000 mg by mouth 2 (two) times daily.    . ondansetron (ZOFRAN) 8 MG tablet Take 1 tablet (8 mg total) by mouth 2 (two) times daily as needed. Start on the third day after chemotherapy. 30 tablet 1  . quinapril (ACCUPRIL) 5 MG tablet Take 5 mg by mouth every morning.     Marland Kitchen SODIUM BICARBONATE PO Take 1,300 mg by mouth at bedtime.    Marland Kitchen dexamethasone (DECADRON) 4 MG tablet Take 2 tablets by mouth once a day on the day after chemotherapy and then take 2 tablets two times a day for 2 days. Take with food. (Patient not taking: Reported on 06/18/2017) 30 tablet 1   No current facility-administered medications for this visit.    Facility-Administered Medications Ordered in Other Visits  Medication Dose Route Frequency Provider Last Rate Last Dose  . heparin lock flush 100 unit/mL  500 Units Intravenous Once Corcoran, Melissa C, MD      . sodium chloride flush (NS) 0.9 % injection 10 mL  10 mL Intravenous PRN Lequita Asal, MD   10 mL at 06/18/17 0825    Review of Systems:  GENERAL:  Feels "tired".  No fevers or sweats.  Weight down 2 pounds. PERFORMANCE STATUS (ECOG):  0 HEENT:  No visual changes, runny nose, sore throat, mouth sores or tenderness. Lungs: No shortness of breath or cough.  No hemoptysis. Cardiac:  No chest pain, palpitations, orthopnea, or PND. GI:  Diarrhea s/p Augmentin.  No nausea, vomiting, constipation, melena or hematochezia.  Takes Welchol s/p cholecystectomy. GU:  No urgency, frequency, dysuria, or hematuria. Musculoskeletal:  No back pain.  No joint pain.  No muscle tenderness. Extremities:  No pain or swelling. Skin:  No rashes or skin changes. Neuro:  No headache, numbness or weakness, balance or coordination issues. Endocrine:  No diabetes, thyroid issues, hot flashes or night sweats. Psych:  Stress.  No depression or anxiety. Pain:  No focal pain. Review of systems:  All other systems reviewed and found to be negative.  Physical Exam: Blood pressure (!)  145/82, pulse 86, temperature 98.8 F (37.1 C), temperature source Tympanic, resp. rate 18, weight 153 lb 6 oz (69.6 kg). GENERAL:  Well developed, well nourished, woman sitting comfortably in the exam room in no acute distress. MENTAL STATUS:  Alert and oriented to person, place and time. HEAD:  Short gray hair.  Normocephalic, atraumatic, face symmetric, no Cushingoid features. EYES:  Glasses.  Blue eyes.  Pupils equal round and reactive to light and accomodation.  No conjunctivitis or scleral icterus. ENT:  Oropharynx clear without lesion.  Tongue normal. Mucous membranes moist.  RESPIRATORY:  Clear to auscultation without rales, wheezes or rhonchi. CARDIOVASCULAR:  Regular rate and rhythm without murmur, rub or gallop. CHEST WALL:  Port-a-cath accessed and unremarkable.  ABDOMEN:  Soft, non-tender with active bowel sounds and no hepatosplenomegaly.  No masses. SKIN:  No rashes, ulcers or lesions. EXTREMITIES: No edema, no skin discoloration or tenderness.  No palpable cords. LYMPH NODES: 2-2.5 cm right axillary lymph node.  No palpable cervical, supraclavicular, or inguinal adenopathy  NEUROLOGICAL: Unremarkable. PSYCH:  Appropriate.   Appointment on 06/18/2017  Component Date Value Ref Range Status  . Magnesium 06/18/2017 2.1  1.7 - 2.4 mg/dL Final   Performed at [facility], [address] [place], [place] [address]  . Sodium 06/18/2017 138  135 - 145 mmol/L Final  . Potassium 06/18/2017 3.7  3.5 - 5.1 mmol/L Final  . Chloride 06/18/2017 104  101 - 111 mmol/L Final  . CO2 06/18/2017 25  22 - 32 mmol/L Final  . Glucose, Bld 06/18/2017 154* 65 - 99 mg/dL Final  . BUN 06/18/2017 11  6 - 20 mg/dL Final  . Creatinine, Ser 06/18/2017 0.81  0.44 - 1.00 mg/dL Final  . Calcium 06/18/2017 9.4  8.9 - 10.3 mg/dL Final  . Total Protein 06/18/2017 7.2  6.5 - 8.1 g/dL Final  . Albumin 06/18/2017 4.3  3.5 - 5.0 g/dL Final  . AST 06/18/2017 24  15 - 41 U/L Final  . ALT 06/18/2017 22   14 - 54 U/L Final  . Alkaline Phosphatase 06/18/2017 42  38 - 126 U/L Final  . Total Bilirubin 06/18/2017 1.0  0.3 - 1.2 mg/dL Final  . GFR calc non Af Amer 06/18/2017 >60  >60 mL/min Final  . GFR calc Af Amer 06/18/2017 >60  >60 mL/min Final   Comment: (NOTE) The eGFR has been calculated using the CKD EPI equation. This calculation has not been validated in all clinical situations. eGFR's persistently <60 mL/min signify possible Chronic Kidney Disease.   Georgiann Hahn gap 06/18/2017 9  5 - 15 Final   Performed at [facility], [address] [place], [place] [address]  . WBC 06/18/2017 3.9  3.6 - 11.0 K/uL Final  . RBC 06/18/2017 4.40  3.80 - 5.20 MIL/uL Final  . Hemoglobin 06/18/2017 13.7  12.0 - 16.0 g/dL Final  . HCT 06/18/2017 39.3  35.0 - 47.0 % Final  . MCV 06/18/2017 89.4  80.0 - 100.0 fL Final  . MCH 06/18/2017 31.2  26.0 - 34.0 pg Final  . MCHC 06/18/2017 34.9  32.0 - 36.0 g/dL Final  . RDW 06/18/2017 13.6  11.5 - 14.5 % Final  . Platelets 06/18/2017 153  150 - 440 K/uL Final  . Neutrophils Relative % 06/18/2017 63  % Final  . Neutro Abs 06/18/2017 2.4  1.4 - 6.5 K/uL Final  . Lymphocytes Relative 06/18/2017 22  % Final  . Lymphs Abs 06/18/2017 0.9* 1.0 - 3.6 K/uL Final  . Monocytes Relative 06/18/2017 10  % Final  . Monocytes Absolute 06/18/2017 0.4  0.2 - 0.9 K/uL Final  . Eosinophils Relative 06/18/2017 4  % Final  . Eosinophils Absolute 06/18/2017 0.1  0 - 0.7 K/uL Final  . Basophils Relative 06/18/2017 1  % Final  . Basophils Absolute 06/18/2017 0.0  0 - 0.1 K/uL Final   Performed at [facility], [address] [place], [place] [address]    Assessment:  Stephanie Sanders is a 72 y.o. 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  Pathology revealed two grade II invasive mammary carcinomas of no special type (4 cm  from the nipple and 5 cm from the nipple).  Tumors were in close proximity.  Lymph  node biopsy confirmed metastatic carcinoma of breast origin.  Tumor is ER negative (< 1%), PR negative and Her 2/neu 2+ by IHC.  Right sided mammogram and ultrasound on 05/21/2017 revealed 2 highly suspicious masses in the right breast at the 10:30 position 4 cm from nipple (1.8 x 1.2 x 1.1 cm) and at the 10:30 position 5 cm from nipple (1.1 x 0.9 x 0.9 cm).  There was a suspicious 2.7 x 1 x 2.2 cm lymph node in the right axilla with asymmetric nodular cortical thickening.  Ultrasound guided biopsy of the 2 masses and lymph node on 05/28/2017 revealed grade II invasive mammary carcinoma of no special type (4 cm from the nipple and 5 cm from the nipple).  Lymph node biopsy revealed metastatic carcinoma of breast origin.  Tumor is ER negative (< 1%), PR negative and Her 2/neu 2+ by IHC.  FISH was negative.  CA 27.29 was 16.5 and CA15-3 was 17.6 on 06/04/2017.  Echo on 06/09/2017 revealed an EF of 60-65%.  She has enrolled on the UPBEAT clinical trial.  Symptomatically, she feels "tired" and has diarrhea. She is taking Augmentin.  Exam reveals a palpable right axillary lymph node.  Port-a-cath site is unremarkable.  Plan: 1.  Labs today:  CBC with diff, CMP, Mg. 2.  Discuss plan for [facility] chemotherapy with Udencya support (biosimilar of pegfilgastrim) per her insurance company.  Discuss plan for [facility] chemotherapy every 2-3 weeks x 4 cycles then switch to weekly Taxol +/- carboplatin.  Side effects reviewed.  Patient consented to treatment. 3.  Discuss diarrhea. Patient to call the [facility] if she is still having diarrhea tomorrow.  If diarrhea ongoing, stool for GI panel and C.diff will be ordered.  4.  Patient having diarrhea and on antibiotics. Will reschedule cycle #1 AC on 06/23/2017. Will need labs (CBC with diff, CMP).  5.  RTC on 06/24/2017 for Udencya. 6.  RTC on 06/30/2017 for nadir assessment and labs (CBC with diff, BMP).   Honor Loh, NP 06/18/2017,9:13 AM    I saw and evaluated the patient,  participating in the key portions of the service and reviewing pertinent diagnostic studies and records.  I reviewed the nurse practitioner's note and agree with the findings and the plan.  The assessment and plan were discussed with the patient.  Several questions were asked by the patient and answered.   Nolon Stalls, MD 06/18/2017,9:13 AM

## 2017-06-18 NOTE — Progress Notes (Signed)
No treatment today per Kathleene Hazel per MD.

## 2017-06-18 NOTE — Progress Notes (Signed)
  Oncology Nurse Navigator Documentation  Navigator Location: [facility] (06/18/17 1100)   )Navigator Encounter Type: Clinic/MDC (06/18/17 1100)                   Treatment Initiated Date: 06/18/17 (06/18/17 1100)   Treatment Phase: First Chemo Tx (06/18/17 1100)                            Time Spent with Patient: 15 (06/18/17 1100)   Met patient prior to her first chemo.  She is a little anxious.  She was talking with Yolande Jolly, RN our research nurse.  She is to call if she has any questions or needs.

## 2017-06-19 ENCOUNTER — Inpatient Hospital Stay: Payer: Medicare Other

## 2017-06-19 ENCOUNTER — Inpatient Hospital Stay: Payer: Medicare Other | Admitting: Hematology and Oncology

## 2017-06-19 ENCOUNTER — Telehealth: Payer: Self-pay | Admitting: *Deleted

## 2017-06-19 ENCOUNTER — Other Ambulatory Visit: Payer: Self-pay | Admitting: *Deleted

## 2017-06-19 DIAGNOSIS — R110 Nausea: Secondary | ICD-10-CM | POA: Insufficient documentation

## 2017-06-19 DIAGNOSIS — Z5189 Encounter for other specified aftercare: Secondary | ICD-10-CM | POA: Diagnosis not present

## 2017-06-19 DIAGNOSIS — C50211 Malignant neoplasm of upper-inner quadrant of right female breast: Secondary | ICD-10-CM | POA: Insufficient documentation

## 2017-06-19 DIAGNOSIS — R05 Cough: Secondary | ICD-10-CM | POA: Diagnosis present

## 2017-06-19 DIAGNOSIS — I10 Essential (primary) hypertension: Secondary | ICD-10-CM | POA: Insufficient documentation

## 2017-06-19 DIAGNOSIS — R062 Wheezing: Secondary | ICD-10-CM | POA: Diagnosis not present

## 2017-06-19 DIAGNOSIS — J101 Influenza due to other identified influenza virus with other respiratory manifestations: Secondary | ICD-10-CM | POA: Insufficient documentation

## 2017-06-19 DIAGNOSIS — R197 Diarrhea, unspecified: Secondary | ICD-10-CM

## 2017-06-19 DIAGNOSIS — R5381 Other malaise: Secondary | ICD-10-CM | POA: Diagnosis not present

## 2017-06-19 DIAGNOSIS — Z79899 Other long term (current) drug therapy: Secondary | ICD-10-CM | POA: Insufficient documentation

## 2017-06-19 DIAGNOSIS — R509 Fever, unspecified: Secondary | ICD-10-CM | POA: Diagnosis not present

## 2017-06-19 DIAGNOSIS — Z006 Encounter for examination for normal comparison and control in clinical research program: Secondary | ICD-10-CM | POA: Diagnosis present

## 2017-06-19 DIAGNOSIS — Z171 Estrogen receptor negative status [ER-]: Secondary | ICD-10-CM | POA: Insufficient documentation

## 2017-06-19 LAB — GASTROINTESTINAL PANEL BY PCR, STOOL (REPLACES STOOL CULTURE)
ASTROVIRUS: NOT DETECTED
Adenovirus F40/41: NOT DETECTED
CAMPYLOBACTER SPECIES: NOT DETECTED
CRYPTOSPORIDIUM: NOT DETECTED
CYCLOSPORA CAYETANENSIS: NOT DETECTED
ENTEROTOXIGENIC E COLI (ETEC): NOT DETECTED
Entamoeba histolytica: NOT DETECTED
Enteroaggregative E coli (EAEC): NOT DETECTED
Enteropathogenic E coli (EPEC): NOT DETECTED
Giardia lamblia: NOT DETECTED
Norovirus GI/GII: NOT DETECTED
PLESIMONAS SHIGELLOIDES: NOT DETECTED
Rotavirus A: NOT DETECTED
SALMONELLA SPECIES: NOT DETECTED
SAPOVIRUS (I, II, IV, AND V): NOT DETECTED
SHIGA LIKE TOXIN PRODUCING E COLI (STEC): NOT DETECTED
Shigella/Enteroinvasive E coli (EIEC): NOT DETECTED
VIBRIO SPECIES: NOT DETECTED
Vibrio cholerae: NOT DETECTED
YERSINIA ENTEROCOLITICA: NOT DETECTED

## 2017-06-19 LAB — C DIFFICILE QUICK SCREEN W PCR REFLEX
C DIFFICLE (CDIFF) ANTIGEN: NEGATIVE
C Diff interpretation: NOT DETECTED
C Diff toxin: NEGATIVE

## 2017-06-19 NOTE — Telephone Encounter (Signed)
Patient called reporting she has stool sample as requested by D rC if she was still having diarrhea. Per Atlee Abide, NP, patient needs c diff and GI Panel. Orders ewntered, lab notified and was told to have patient bring sample to lab and knock on door to give to them. Patient informed

## 2017-06-23 ENCOUNTER — Inpatient Hospital Stay: Payer: Medicare Other | Attending: Hematology and Oncology

## 2017-06-23 ENCOUNTER — Encounter: Payer: Self-pay | Admitting: *Deleted

## 2017-06-23 ENCOUNTER — Inpatient Hospital Stay: Payer: Medicare Other

## 2017-06-23 DIAGNOSIS — C50211 Malignant neoplasm of upper-inner quadrant of right female breast: Secondary | ICD-10-CM

## 2017-06-23 DIAGNOSIS — Z171 Estrogen receptor negative status [ER-]: Principal | ICD-10-CM

## 2017-06-23 LAB — CBC WITH DIFFERENTIAL/PLATELET
Basophils Absolute: 0 10*3/uL (ref 0–0.1)
Basophils Relative: 1 %
Eosinophils Absolute: 0.2 10*3/uL (ref 0–0.7)
Eosinophils Relative: 3 %
HCT: 40.2 % (ref 35.0–47.0)
Hemoglobin: 14 g/dL (ref 12.0–16.0)
Lymphocytes Relative: 27 %
Lymphs Abs: 1.5 10*3/uL (ref 1.0–3.6)
MCH: 31 pg (ref 26.0–34.0)
MCHC: 34.7 g/dL (ref 32.0–36.0)
MCV: 89.2 fL (ref 80.0–100.0)
Monocytes Absolute: 0.4 10*3/uL (ref 0.2–0.9)
Monocytes Relative: 8 %
Neutro Abs: 3.6 10*3/uL (ref 1.4–6.5)
Neutrophils Relative %: 61 %
Platelets: 203 10*3/uL (ref 150–440)
RBC: 4.51 MIL/uL (ref 3.80–5.20)
RDW: 13.4 % (ref 11.5–14.5)
WBC: 5.8 10*3/uL (ref 3.6–11.0)

## 2017-06-23 LAB — COMPREHENSIVE METABOLIC PANEL
ALT: 26 U/L (ref 14–54)
AST: 23 U/L (ref 15–41)
Albumin: 4.4 g/dL (ref 3.5–5.0)
Alkaline Phosphatase: 41 U/L (ref 38–126)
Anion gap: 7 (ref 5–15)
BUN: 9 mg/dL (ref 6–20)
CO2: 27 mmol/L (ref 22–32)
Calcium: 9.4 mg/dL (ref 8.9–10.3)
Chloride: 104 mmol/L (ref 101–111)
Creatinine, Ser: 0.74 mg/dL (ref 0.44–1.00)
GFR calc Af Amer: >60 mL/min (ref >60)
GFR calc non Af Amer: >60 mL/min (ref >60)
Glucose, Bld: 140 mg/dL — ABNORMAL HIGH (ref 65–99)
Potassium: 3.7 mmol/L (ref 3.5–5.1)
Sodium: 138 mmol/L (ref 135–145)
Total Bilirubin: 1 mg/dL (ref 0.3–1.2)
Total Protein: 7.2 g/dL (ref 6.5–8.1)

## 2017-06-23 MED ORDER — PALONOSETRON HCL INJECTION 0.25 MG/5ML
0.2500 mg | Freq: Once | INTRAVENOUS | Status: AC
  Administered 2017-06-23: 0.25 mg via INTRAVENOUS
  Filled 2017-06-23: qty 5

## 2017-06-23 MED ORDER — ACETAMINOPHEN 325 MG PO TABS
ORAL_TABLET | ORAL | Status: AC
  Filled 2017-06-23: qty 2

## 2017-06-23 MED ORDER — SODIUM CHLORIDE 0.9 % IV SOLN
Freq: Once | INTRAVENOUS | Status: AC
  Administered 2017-06-23: 09:00:00 via INTRAVENOUS
  Filled 2017-06-23: qty 1000

## 2017-06-23 MED ORDER — SODIUM CHLORIDE 0.9 % IV SOLN
600.0000 mg/m2 | Freq: Once | INTRAVENOUS | Status: AC
  Administered 2017-06-23: 1100 mg via INTRAVENOUS
  Filled 2017-06-23: qty 40

## 2017-06-23 MED ORDER — ACETAMINOPHEN 325 MG PO TABS
650.0000 mg | ORAL_TABLET | Freq: Once | ORAL | Status: AC
  Administered 2017-06-23: 650 mg via ORAL

## 2017-06-23 MED ORDER — HEPARIN SOD (PORK) LOCK FLUSH 100 UNIT/ML IV SOLN
500.0000 [IU] | Freq: Once | INTRAVENOUS | Status: AC | PRN
  Administered 2017-06-23: 500 [IU]
  Filled 2017-06-23: qty 5

## 2017-06-23 MED ORDER — DOXORUBICIN HCL CHEMO IV INJECTION 2 MG/ML
60.0000 mg/m2 | Freq: Once | INTRAVENOUS | Status: AC
  Administered 2017-06-23: 110 mg via INTRAVENOUS
  Filled 2017-06-23: qty 55

## 2017-06-23 MED ORDER — FOSAPREPITANT DIMEGLUMINE INJECTION 150 MG
Freq: Once | INTRAVENOUS | Status: AC
  Administered 2017-06-23: 09:00:00 via INTRAVENOUS
  Filled 2017-06-23: qty 5

## 2017-06-23 NOTE — Progress Notes (Signed)
  Oncology Nurse Navigator Documentation  Navigator Location: [facility] (06/23/17 1100)   )Navigator Encounter Type: Clinic/MDC (06/23/17 1100)                       Treatment Phase: Treatment (06/23/17 1100)                            Time Spent with Patient: 15 (06/23/17 1100)   Met patient during her chemotherapy treatment.  She is to call if she has any questions or needs.

## 2017-06-24 ENCOUNTER — Other Ambulatory Visit: Payer: Self-pay | Admitting: Hematology and Oncology

## 2017-06-24 ENCOUNTER — Inpatient Hospital Stay: Payer: Medicare Other

## 2017-06-24 DIAGNOSIS — C50211 Malignant neoplasm of upper-inner quadrant of right female breast: Secondary | ICD-10-CM | POA: Diagnosis not present

## 2017-06-24 DIAGNOSIS — Z171 Estrogen receptor negative status [ER-]: Principal | ICD-10-CM

## 2017-06-24 MED ORDER — PEGFILGRASTIM-CBQV 6 MG/0.6ML ~~LOC~~ SOSY
6.0000 mg | PREFILLED_SYRINGE | Freq: Once | SUBCUTANEOUS | Status: AC
  Administered 2017-06-24: 6 mg via SUBCUTANEOUS
  Filled 2017-06-24: qty 0.6

## 2017-06-24 NOTE — Progress Notes (Signed)
Patient will be receiving Udencya due to insurance.

## 2017-06-25 ENCOUNTER — Ambulatory Visit: Payer: Federal, State, Local not specified - PPO | Admitting: Hematology and Oncology

## 2017-06-25 ENCOUNTER — Telehealth: Payer: Self-pay | Admitting: *Deleted

## 2017-06-25 ENCOUNTER — Inpatient Hospital Stay (HOSPITAL_BASED_OUTPATIENT_CLINIC_OR_DEPARTMENT_OTHER): Payer: Medicare Other | Admitting: Oncology

## 2017-06-25 ENCOUNTER — Other Ambulatory Visit: Payer: Federal, State, Local not specified - PPO | Admitting: *Deleted

## 2017-06-25 ENCOUNTER — Inpatient Hospital Stay: Payer: Medicare Other | Admitting: *Deleted

## 2017-06-25 VITALS — BP 154/76 | HR 101 | Temp 100.6°F | Resp 20 | Wt 155.2 lb

## 2017-06-25 DIAGNOSIS — R509 Fever, unspecified: Secondary | ICD-10-CM

## 2017-06-25 DIAGNOSIS — R110 Nausea: Secondary | ICD-10-CM

## 2017-06-25 DIAGNOSIS — R5381 Other malaise: Secondary | ICD-10-CM | POA: Diagnosis not present

## 2017-06-25 DIAGNOSIS — R05 Cough: Secondary | ICD-10-CM | POA: Diagnosis not present

## 2017-06-25 DIAGNOSIS — Z171 Estrogen receptor negative status [ER-]: Principal | ICD-10-CM

## 2017-06-25 DIAGNOSIS — Z5189 Encounter for other specified aftercare: Secondary | ICD-10-CM

## 2017-06-25 DIAGNOSIS — C50211 Malignant neoplasm of upper-inner quadrant of right female breast: Secondary | ICD-10-CM | POA: Diagnosis not present

## 2017-06-25 DIAGNOSIS — R000 Tachycardia, unspecified: Secondary | ICD-10-CM | POA: Diagnosis not present

## 2017-06-25 LAB — CBC WITH DIFFERENTIAL/PLATELET
Basophils Absolute: 0 10*3/uL (ref 0–0.1)
Basophils Relative: 0 %
Eosinophils Absolute: 0 10*3/uL (ref 0–0.7)
Eosinophils Relative: 0 %
HCT: 38.1 % (ref 35.0–47.0)
Hemoglobin: 12.8 g/dL (ref 12.0–16.0)
Lymphocytes Relative: 1 %
Lymphs Abs: 0.3 10*3/uL — ABNORMAL LOW (ref 1.0–3.6)
MCH: 30.2 pg (ref 26.0–34.0)
MCHC: 33.7 g/dL (ref 32.0–36.0)
MCV: 89.6 fL (ref 80.0–100.0)
Monocytes Absolute: 0.5 10*3/uL (ref 0.2–0.9)
Monocytes Relative: 3 %
Neutro Abs: 19.3 10*3/uL — ABNORMAL HIGH (ref 1.4–6.5)
Neutrophils Relative %: 96 %
Platelets: 172 10*3/uL (ref 150–440)
RBC: 4.25 MIL/uL (ref 3.80–5.20)
RDW: 13.2 % (ref 11.5–14.5)
WBC: 20 10*3/uL — ABNORMAL HIGH (ref 3.6–11.0)

## 2017-06-25 LAB — BASIC METABOLIC PANEL
Anion gap: 8 (ref 5–15)
BUN: 15 mg/dL (ref 6–20)
CO2: 23 mmol/L (ref 22–32)
Calcium: 9.1 mg/dL (ref 8.9–10.3)
Chloride: 101 mmol/L (ref 101–111)
Creatinine, Ser: 0.8 mg/dL (ref 0.44–1.00)
GFR calc Af Amer: >60 mL/min (ref >60)
GFR calc non Af Amer: >60 mL/min (ref >60)
Glucose, Bld: 201 mg/dL — ABNORMAL HIGH (ref 65–99)
Potassium: 4.3 mmol/L (ref 3.5–5.1)
Sodium: 132 mmol/L — ABNORMAL LOW (ref 135–145)

## 2017-06-25 MED ORDER — LEVOFLOXACIN 500 MG PO TABS: 500.0000 mg | ORAL_TABLET | Freq: Every day | ORAL | 0 refills | Status: DC

## 2017-06-25 NOTE — Telephone Encounter (Signed)
Patient called to report that she is having problem with runny nose and having to clear her bronchials (she clearing her throat)frequently. She reports that she has temp of 100.5 and she had her first chemotherapy on Tuesday

## 2017-06-25 NOTE — Telephone Encounter (Signed)
Patient coming in this morning

## 2017-06-25 NOTE — Progress Notes (Signed)
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 Mike Gip, patient's primary oncologist on 06/18/2016 for assessment prior to cycle #1 of Adriamycin and Cytoxan with Udencya support . She was recently diagnosed with triple negative right breast cancer. Patient had a right-sided mammogram and ultrasound on 05/21/2017 revealing 2 highly suspicious masses and ultrasound-guided biopsy was performed on 05/28/2017 confirming diagnosis. Patient had echocardiogram on 06/09/2017 revealing an ejection fraction of 60-65% prior to chemo. Patient has been enrolled to the UPBEAT clinical trial. During that visit patient stated she was feeling tired and having episodes of diarrhea thought to be from Augmentin (Started on 06/13/17 by surgeon). It was suggested that a stool sample be collected to rule out C-diff and other GI illness. Stool sample negative.  Interval history-  Stephanie Sanders presents with fevers up to 100.6 degrees.   She has had the fever for 2 days.   Symptoms have been gradually worsening.  Symptoms associated with the fever include body aches, chills, fatigue and URI symptoms, and patient denies abdominal pain, diarrhea, headache, nausea, otitis symptoms, poor appetite, urinary tract symptoms and vomiting.        Symptoms are worse at no particular time of day. Constant all day.  Symptoms have been gradually worsening since that time.  Patient has been restless.  Appetite has been stable. Urine output has been weak.  She has associated symptoms  of cough without sputum production and post-nasal drip.  She has tried to alleviate the symptoms with none . The patient has cancer, immunocompromised state of breast cancer. S/P cycle 1 chemo on Tuesday.  Daycare:not applicable. Exposure to tobacco: not applicable.  Exposure to someone else at home w/similar symptoms:no  Exposure to someone else at daycare/school/work:no.   ECOG FS:1 - Symptomatic but completely ambulatory  Review of systems- Review of Systems  Constitutional: Positive for chills, fever and malaise/fatigue. Negative for diaphoresis and weight loss.  HENT: Negative for congestion and ear pain.   Eyes: Negative.  Negative for blurred vision and double vision.  Respiratory: Positive for cough. Negative for hemoptysis, sputum production, shortness of breath and wheezing.   Cardiovascular: Negative.  Negative for chest pain, palpitations and leg swelling.  Gastrointestinal: Positive for nausea. Negative for abdominal pain, blood in stool, constipation, diarrhea, melena and vomiting.  Genitourinary: Negative for dysuria, frequency and urgency.  Musculoskeletal: Negative for back pain, falls, myalgias and neck pain.  Skin: Negative.  Negative for rash.  Neurological: Negative.  Negative for weakness and headaches.  Endo/Heme/Allergies: Negative.  Does not bruise/bleed easily.  Psychiatric/Behavioral: Negative.  Negative for depression. The patient is not nervous/anxious and does not have insomnia.      Current treatment- S/P Cycle 1 AC  Allergies  Allergen Reactions  . Fiorinal [Butalbital-Aspirin-Caffeine] Nausea And Vomiting and Other (See Comments)    SEVERE HEADACHE  . Other     STERI-STRIPS-RASH  . Sulfa Antibiotics Other (See Comments)    Burning esophagus and stomach  . Tramadol Nausea And Vomiting     Past Medical History:  Diagnosis Date  . Cancer (  [place])   . Diabetes mellitus without complication ([place])   . Diabetic nephropathy ([place])   . Headache     migraines  . Heart murmur    ASYMPTOMATIC  . Hypertension   . Hypothyroidism   . Seborrheic keratosis   . Vitamin D deficiency      Past Surgical History:  Procedure Laterality Date  . APPENDECTOMY    . BREAST BIOPSY Right 05/28/2017   right breast bx 3 areas invasive mamm ca lymph node mets  . BREAST CYST ASPIRATION Bilateral    neg  . CHOLECYSTECTOMY    . COLONOSCOPY WITH PROPOFOL N/A 01/01/2015   Procedure: COLONOSCOPY WITH PROPOFOL;  Surgeon: Josefine Class, MD;  Location: [facility] ENDOSCOPY;  Service: Endoscopy;  Laterality: N/A;  . EYE SURGERY     eyelid  . PORTACATH PLACEMENT Right 06/10/2017   Procedure: INSERTION PORT-A-CATH;  Surgeon: Herbert Pun, MD;  Location: [facility] ORS;  Service: General;  Laterality: Right;  . STENT PLACEMENT ILIAC ([place] HX)    . TUBAL LIGATION      Social History   Socioeconomic History  . Marital status: Married    Spouse name: Not on file  . Number of children: Not on file  . Years of education: Not on file  . Highest education level: Not on file  Social Needs  . Financial resource strain: Not on file  . Food insecurity - worry: Not on file  . Food insecurity - inability: Not on file  . Transportation needs - medical: Not on file  . Transportation needs - non-medical: Not on file  Occupational History  . Not on file  Tobacco Use  . Smoking status: Former Smoker    Packs/day: 2.00    Years: 10.00    Pack years: 20.00    Types: Cigarettes    Last attempt to quit: 04/21/1972    Years since quitting: 45.2  . Smokeless tobacco: Never Used  Substance and Sexual Activity  . Alcohol use: No    Frequency: Never  . Drug use: No  . Sexual activity: Not on file  Other Topics Concern  . Not on file  Social History Narrative  . Not on file    Family History  Problem Relation Age of Onset  . Cancer Mother   . Cancer Maternal Aunt   . Breast cancer Neg Hx      Current Outpatient Medications:  .  acetaminophen (TYLENOL)  500 MG tablet, Take 1,000 mg by mouth every 6 (six) hours as needed (for headaches.)., Disp: , Rfl:  .  aspirin EC 81 MG tablet, Take 81 mg by mouth daily., Disp: , Rfl:  .  aspirin-acetaminophen-caffeine (EXCEDRIN MIGRAINE) 250-250-65 MG tablet, Take 2 tablets by mouth every 6 (six) hours as needed for headache., Disp: , Rfl:  .  atorvastatin (LIPITOR) 10 MG tablet, Take 10 mg by mouth every evening. , Disp: , Rfl:  .  Cholecalciferol 2000 UNITS CAPS, Take 4,000 Units by mouth daily. , Disp: , Rfl:  .  colesevelam (WELCHOL) 625 MG tablet, Take 1,875 mg by mouth every evening. , Disp: , Rfl:  .  dexamethasone (DECADRON) 4 MG tablet, Take 2 tablets by mouth once a day on the day after chemotherapy and then take 2 tablets two times a day for 2 days. Take with food., Disp: 30 tablet, Rfl: 1 .  levothyroxine (SYNTHROID, LEVOTHROID) 50 MCG tablet, Take 50 mcg by mouth daily before breakfast., Disp: , Rfl:  .  lidocaine-prilocaine (EMLA)  cream, Apply 1 application topically as needed., Disp: 30 g, Rfl: 6 .  LORazepam (ATIVAN) 0.5 MG tablet, Take 1 tablet (0.5 mg total) by mouth every 6 (six) hours as needed (Nausea or vomiting)., Disp: 30 tablet, Rfl: 0 .  Melatonin 5 MG TABS, Take 5 mg by mouth at bedtime as needed (for sleep.)., Disp: , Rfl:  .  metFORMIN (GLUCOPHAGE) 1000 MG tablet, Take 1,000 mg by mouth 2 (two) times daily., Disp: , Rfl:  .  ondansetron (ZOFRAN) 8 MG tablet, Take 1 tablet (8 mg total) by mouth 2 (two) times daily as needed. Start on the third day after chemotherapy., Disp: 30 tablet, Rfl: 1 .  quinapril (ACCUPRIL) 5 MG tablet, Take 5 mg by mouth every morning. , Disp: , Rfl:  .  SODIUM BICARBONATE PO, Take 1,300 mg by mouth at bedtime., Disp: , Rfl:  .  levofloxacin (LEVAQUIN) 500 MG tablet, Take 1 tablet (500 mg total) by mouth daily., Disp: 10 tablet, Rfl: 0  Physical exam:  Vitals:   06/25/17 1135  BP: (!) 154/76  Pulse: (!) 101  Resp: 20  Temp: (!) 100.6 F (38.1 C)    TempSrc: Tympanic  Weight: 155 lb 4 oz (70.4 kg)   Physical Exam  Constitutional: She is oriented to person, place, and time and well-developed, well-nourished, and in no distress. Vital signs are normal.  HENT:  Head: Normocephalic and atraumatic.  Eyes: Pupils are equal, round, and reactive to light.  Neck: Normal range of motion.  Cardiovascular: Regular rhythm and normal heart sounds. Tachycardia present.  No murmur heard. Heart rate 101  Pulmonary/Chest: Effort normal and breath sounds normal. She has no wheezes.  Abdominal: Soft. Normal appearance and bowel sounds are normal. She exhibits no distension. There is no tenderness.  Musculoskeletal: Normal range of motion. She exhibits no edema.  Neurological: She is alert and oriented to person, place, and time. Gait normal.  Skin: Skin is warm, dry and intact. No rash noted.  Psychiatric: Mood, memory, affect and judgment normal.     CMP Latest Ref Rng & Units 06/25/2017  Glucose 65 - 99 mg/dL 201(H)  BUN 6 - 20 mg/dL 15  Creatinine 0.44 - 1.00 mg/dL 0.80  Sodium 135 - 145 mmol/L 132(L)  Potassium 3.5 - 5.1 mmol/L 4.3  Chloride 101 - 111 mmol/L 101  CO2 22 - 32 mmol/L 23  Calcium 8.9 - 10.3 mg/dL 9.1  Total Protein 6.5 - 8.1 g/dL -  Total Bilirubin 0.3 - 1.2 mg/dL -  Alkaline Phos 38 - 126 U/L -  AST 15 - 41 U/L -  ALT 14 - 54 U/L -   CBC Latest Ref Rng & Units 06/25/2017  WBC 3.6 - 11.0 K/uL 20.0(H)  Hemoglobin 12.0 - 16.0 g/dL 12.8  Hematocrit 35.0 - 47.0 % 38.1  Platelets 150 - 440 K/uL 172    No images are attached to the encounter.  Dg Chest Port 1 View  Result Date: 06/10/2017 CLINICAL DATA:  Porta catheter placement EXAM: PORTABLE CHEST 1 VIEW COMPARISON:  None. FINDINGS: Left IJ porta catheter without kink. Tip is at the upper cavoatrial junction. There is no pneumothorax or mediastinal widening. Heart size is normal. The lungs are clear. IMPRESSION: Left porta catheter with tip at the upper cavoatrial junction.  No evidence of cardiopulmonary disease. Electronically Signed   By: Monte Fantasia M.D.   On: 06/10/2017 11:50   Dg C-arm 1-60 Min-no Report  Result Date: 06/10/2017 Fluoroscopy was utilized by  the requesting physician.  No radiographic interpretation.   Mm Clip Placement Right  Result Date: 05/28/2017 CLINICAL DATA:  72 year old patient had 2 ultrasound-guided right breast biopsies and 1 ultrasound-guided biopsy of a suspicious right axillary lymph node. EXAM: DIAGNOSTIC RIGHT MAMMOGRAM POST ULTRASOUND BIOPSIES COMPARISON:  Previous exam(s). FINDINGS: Mammographic images were obtained following ultrasound-guided biopsies of 2 separate masses in the 10:30 position of the right breast and of a suspicious lymph node in the far lateral right axilla. A wing shaped biopsy clip is satisfactorily positioned within the suspicious mass at 10:30 position 4 cm from the nipple. A coil shaped biopsy clip is satisfactorily positioned within the suspicious mass at 10:30 position 5 cm from the nipple. These 2 biopsy clips are separated by 1 cm. A HydroMARK biopsy clip is seen within the enlarged right axillary lymph node on the MLO projection. IMPRESSION: Satisfactory position of 2 biopsy clips in the right breast and 1 biopsy clip in the right axillary lymph node. Final Assessment: Post Procedure Mammograms for Marker Placement Electronically Signed   By: Curlene Dolphin M.D.   On: 05/28/2017 09:41   [place] Rt Breast Bx W Loc Dev 1st Lesion Img Bx Spec US Guide  Addendum Date: 06/01/2017   ADDENDUM REPORT: 05/29/2017 16:49 ADDENDUM: Pathology of the right breast biopsy, 10:30 o'clock, 4 cm from nipple revealed A. RIGHT BREAST, 10:30, 4 CMFN; ULTRASOUND-GUIDED BIOPSY: INVASIVE MAMMARY CARCINOMA OF NO SPECIAL TYPE. Size of invasive carcinoma: 0.8 cm in this sample. Histologic grade of invasive carcinoma: Grade 2. ER/PR/HER2: Immunohistochemistry will be performed with reflex to [place] for HER2 2+ (equivocal) results. The results  will be reported in an addendum. This was found to be concordant with Dr. Theodosia Blender impression and notes and confirmed by Dr. Augustin Coupe. Recommendation: Surgical and oncology referrals. The patient came in to the [facility] on 05/29/17 with concerns of possible bleeding at the biopsy site. Dr. Augustin Coupe examined the patient and new dressings were applied. There was no bleeding or hematoma and biopsy sites were healing well. Post biopsy instructions were reviewed with the patient and her questions were answered. At the patient's request, results and recommendations were relayed to the patient by phone by Dr. Augustin Coupe on 05/29/17 at 4:00 PM. All of her questions were answered. She will be contacted by the nurse navigators for [facility] who will arrange her referrals. Request for referrals was relayed to Al Pimple, RN and Tanya Nones, RN, nurse navigators for [facility] by [place], [place] on 05/29/17. Addendum by Jetta Lout, RRA on 05/29/17. Electronically Signed   By: Abelardo Diesel M.D.   On: 05/29/2017 16:49   Addendum Date: 05/28/2017   ADDENDUM REPORT: 05/28/2017 10:28 ADDENDUM: Prior to performing the patient's biopsies today, she mentioned to me that she has a palpable area in the upper inner right breast, slightly lateral to a cutaneous scar she has. The cutaneous scar is from excision of a large palpable an infected sebaceous cyst in the past, per patient report. Therefore, prior to performing the biopsies today, I evaluated the area of patient concern with ultrasound, at 2 o'clock position parasternal. I believe that the patient is palpating normal subcutaneous tissue which overlies a prominent costochondral junction. She may be palpating the costochondral junction. However, along the lateral aspect of the cutaneous scar is a small sebaceous cyst measuring 0.6 x 0.4 cm in the antiradial projection. There is no color Doppler flow within the sebaceous cyst. I do not  believe  that the sebaceous cyst is palpable. No suspicious findings in the upper inner quadrant in the region of patient concern. Electronically Signed   By: Curlene Dolphin M.D.   On: 05/28/2017 10:28   Result Date: 06/01/2017 CLINICAL DATA:  Ultrasound-guided biopsy was recommended of a right breast mass at 10:30 position 4 cm from the nipple. Please note that the patient had a total of 3 right breast/axillary biopsies performed today, each of which is dictated separately. EXAM: ULTRASOUND GUIDED RIGHT BREAST CORE NEEDLE BIOPSY COMPARISON:  Previous exam(s). FINDINGS: I met with the patient and we discussed the procedure of ultrasound-guided biopsy, including benefits and alternatives. We discussed the high likelihood of a successful procedure. We discussed the risks of the procedure, including infection, bleeding, tissue injury, clip migration, and inadequate sampling. Informed written consent was given. The usual time-out protocol was performed immediately prior to the procedure. Lesion quadrant: Upper outer quadrant Using sterile technique and 1% Lidocaine as local anesthetic, under direct ultrasound visualization, a 12 gauge spring-loaded device was used to perform biopsy of a suspicious hypoechoic mass at 10:30 position 4 cm from the nipple using a lateral approach. At the conclusion of the procedure a wing shaped tissue marker clip was deployed into the biopsy cavity. Follow up 2 view mammogram was performed and dictated separately. IMPRESSION: Ultrasound guided biopsy of the right breast. No apparent complications. Electronically Signed: By: Curlene Dolphin M.D. On: 05/28/2017 09:43   [place] Rt Breast Bx W Loc Dev Ea Add Lesion Img Bx Spec US Guide  Addendum Date: 06/01/2017   ADDENDUM REPORT: 05/29/2017 16:55 ADDENDUM: Pathology of the right axillary lymph node biopsy revealed C. LYMPH NODE, RIGHT AXILLARY; ULTRASOUND-GUIDED BIOPSY: METASTATIC CARCINOMA OF BREAST ORIGIN. This was found to be concordant with  Dr. Theodosia Blender impression and notes and confirmed by Dr. Augustin Coupe. Recommendation: Surgical and oncology referrals. The patient was seen at the [facility] by Dr. Augustin Coupe on 05/29/17 with concerns of possible bleeding at the biopsy site. The site was examined and a new dressing applied. There was no bleeding or hematoma. Post biopsy instructions were reviewed with the patient and questions were answered. At the patient's request, results and recommendations were relayed to the patient by phone by Dr. Augustin Coupe on 05/29/17 at 4:00 PM. All of her questions were answered. She will be contacted by the nurse navigators for [facility] to arrange her referrals. The patient was encouraged to call the [facility] with any further questions or concerns. Request for referrals was relayed to Tanya Nones, RN and Al Pimple, RN, nurse navigators for [facility] by Jetta Lout, [place] on 05/29/17. Addendum by Jetta Lout, RRA on 05/29/17. Electronically Signed   By: Abelardo Diesel M.D.   On: 05/29/2017 16:55   Result Date: 06/01/2017 CLINICAL DATA:  Ultrasound-guided biopsy of a suspicious right axillary lymph node was recommended. EXAM: ULTRASOUND GUIDED CORE NEEDLE BIOPSY OF A RIGHT AXILLARY NODE COMPARISON:  Previous exam(s). FINDINGS: I met with the patient and we discussed the procedure of ultrasound-guided biopsy, including benefits and alternatives. We discussed the high likelihood of a successful procedure. We discussed the risks of the procedure, including infection, bleeding, tissue injury, clip migration, and inadequate sampling. Informed written consent was given. The usual time-out protocol was performed immediately prior to the procedure. Using sterile technique and 1% Lidocaine as local anesthetic, under direct ultrasound visualization, a 14 gauge spring-loaded device was used to perform biopsy of an enlarged lymph node with thickened  hypoechoic cortex in the inferior and  lateral right axilla using a lateral approach. At the conclusion of the procedure a HydroMARK tissue marker clip was deployed into the biopsy cavity. Follow up 2 view mammogram was performed and dictated separately. IMPRESSION: Ultrasound guided biopsy of a right axillary lymph node. No apparent complications. Electronically Signed: By: Curlene Dolphin M.D. On: 05/28/2017 09:45   [place] Rt Breast Bx W Loc Dev Ea Add Lesion Img Bx Spec US Guide  Addendum Date: 06/01/2017   ADDENDUM REPORT: 05/29/2017 17:01 ADDENDUM: Pathology of the right breast biopsy, 10:30 o'clock, 5 cm from nipple revealed B. RIGHT BREAST, 10:30, 5 CMFN; ULTRASOUND-GUIDED BIOPSY: INVASIVE MAMMARY CARCINOMA OF NO SPECIAL TYPE. Size of invasive carcinoma: 0.7 cm in this sample. Histologic grade of invasive carcinoma: Grade 2. This was found to be concordant with Dr. Theodosia Blender impression and notes and confirmed by Dr. Augustin Coupe. Recommendation: Surgical and oncology referrals. The patient came in to the [facility] on 05/29/17 with concerns of possible bleeding at the biopsy site. Dr. Augustin Coupe examined the patient and new dressings were applied. There was no bleeding or hematoma. Post biopsy instructions were reviewed with the patient and all of her questions were answered. At the patient's request, results and recommendations were relayed to the patient by phone by Dr. Augustin Coupe on 05/29/17 at 4:00 PM. All of her questions were answered. She will be contacted by the nurse navigators for [facility] to arrange referrals. Request for referrals was relayed to Al Pimple, RN and Tanya Nones, RN, nurse navigators for [facility] by [place], [place] on 05/29/17. Addendum by Jetta Lout, RRA on 05/29/17. Electronically Signed   By: Abelardo Diesel M.D.   On: 05/29/2017 17:01   Result Date: 06/01/2017 CLINICAL DATA:  Ultrasound-guided biopsy was recommended of a suspicious mass at 10:30 position right breast 5 cm from the  nipple. EXAM: ULTRASOUND GUIDED RIGHT BREAST CORE NEEDLE BIOPSY COMPARISON:  Previous exam(s). FINDINGS: I met with the patient and we discussed the procedure of ultrasound-guided biopsy, including benefits and alternatives. We discussed the high likelihood of a successful procedure. We discussed the risks of the procedure, including infection, bleeding, tissue injury, clip migration, and inadequate sampling. Informed written consent was given. The usual time-out protocol was performed immediately prior to the procedure. Lesion quadrant: Upper outer quadrant Using sterile technique and 1% Lidocaine as local anesthetic, under direct ultrasound visualization, a 12 gauge spring-loaded device was used to perform biopsy of a suspicious hypoechoic mass at 10:30 position 5 cm from the nipple using a lateral approach. At the conclusion of the procedure a coil shaped tissue marker clip was deployed into the biopsy cavity. Follow up 2 view mammogram was performed and dictated separately. IMPRESSION: Ultrasound guided biopsy of the right breast. No apparent complications. Electronically Signed: By: Curlene Dolphin M.D. On: 05/28/2017 09:43    Assessment and plan- Patient is a 72 y.o. female who presents to symptom management clinic for fever X 2 days and dry cough.  1. Triple Negative Right Breast Cancer: Patient is currently status post cycle 1 of Cytoxan and Adriamycin. She received neulasta yesterday. Continue follow-up as scheduled to see Dr. Mike Gip. Continue neutropenic precautions.   2. Fever/cough: Likely an upper respiratory infection. Lungs were clear on exam. No need for chest x-ray at this time. Denies sputum production. Positive for dry cough. Take Tylenol as needed for fevers. RX Levaquin 500 mg daily for 7 days. Labs ordered including blood cultures.  Negative for growth at this time. Mild dehydration??? Sodium 132. Tachycardic HR 101. Encouraged her to stay hydrated. RTC tomorrow for follow-up exam prior  to weekend. Case discussed with Dr. Mike Gip, primary oncologist.   Visit Diagnosis 1. Fever, unspecified fever cause   2. Malignant neoplasm of upper-inner quadrant of right breast in female, estrogen receptor negative ([place])     Patient expressed understanding and was in agreement with this plan. She also understands that She can call clinic at any time with any questions, concerns, or complaints.   Greater than 50% was spent in counseling and coordination of care with this patient including but not limited to discussion of the relevant topics above (See A&P) including, but not limited to diagnosis and management of acute and chronic medical conditions.    Faythe Casa, AGNP-C [facility] [place]- [phone] Pager- [phone] 06/26/2017 9:17 AM

## 2017-06-25 NOTE — Progress Notes (Signed)
Pt in today with reports of cough and post nasal drip.  Had a temp of 100.4 Tues and Wed.

## 2017-06-25 NOTE — Telephone Encounter (Signed)
I can see her today. Ask her if stop by the lab for CBC, CMET please. Thanks. Sonia Baller

## 2017-06-26 ENCOUNTER — Encounter: Payer: Self-pay | Admitting: Oncology

## 2017-06-26 ENCOUNTER — Inpatient Hospital Stay (HOSPITAL_BASED_OUTPATIENT_CLINIC_OR_DEPARTMENT_OTHER): Payer: Medicare Other | Admitting: Oncology

## 2017-06-26 VITALS — BP 144/76 | HR 89 | Temp 98.3°F | Resp 16

## 2017-06-26 DIAGNOSIS — R509 Fever, unspecified: Secondary | ICD-10-CM | POA: Diagnosis not present

## 2017-06-26 DIAGNOSIS — C50211 Malignant neoplasm of upper-inner quadrant of right female breast: Secondary | ICD-10-CM

## 2017-06-26 DIAGNOSIS — Z171 Estrogen receptor negative status [ER-]: Secondary | ICD-10-CM | POA: Diagnosis not present

## 2017-06-26 NOTE — Progress Notes (Signed)
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 Status post cycle 1 Adriamycin and Cytoxan with Udencya support. Patient was seen at symptom management yesterday for fever and cough. She was given by mouth Levaquin and encouraged to take Tylenol as needed for fevers. She was asked to return to clinic today prior to the weekend to ensure she was feeling better.  Interval history-  [name] presents with fevers up to 100.6 degrees.  She has had the fever for 3 days.   Symptoms have been gradually improving.  Symptoms associated with the fever include body aches, chills and fatigue, and patient denies abdominal pain. Symptoms are worse at no particular time of day.   Symptoms have been unchanged since that time.  Patient has been sleeping well.  Appetite has been unchanged. Urine output has been improved.  She has associated symptoms of    She has tried to alleviate the symptoms with acetaminophen with complete relief.    The patient has cancer, immunocompromised state of recent chemotherapy.  Daycare:no. Exposure to tobacco: no.  Exposure to someone else at home w/similar symptoms:no  Exposure to someone else at daycare/school/work:no.   ECOG FS:1 - Symptomatic but completely ambulatory  Review of systems- Review of Systems  Constitutional: Negative.  Negative for chills, fever (No fever since yesterday during appt- Has taken 2 doses of tylenol), malaise/fatigue and weight loss.  HENT: Negative  for congestion and ear pain.   Eyes: Negative.  Negative for blurred vision and double vision.  Respiratory: Positive for cough. Negative for sputum production and shortness of breath.   Cardiovascular: Negative.  Negative for chest pain, palpitations and leg swelling.  Gastrointestinal: Negative.  Negative for abdominal pain, constipation, diarrhea, nausea and vomiting.  Genitourinary: Negative for dysuria, frequency and urgency.  Musculoskeletal: Negative for back pain and falls.  Skin: Negative.  Negative for rash.  Neurological: Negative.  Negative for weakness and headaches.  Endo/Heme/Allergies: Negative.  Does not bruise/bleed easily.  Psychiatric/Behavioral: Negative.  Negative for depression. The patient is not nervous/anxious and does not have insomnia.      Current treatment- S/P Cycle 1 AC  Allergies  Allergen Reactions  . Fiorinal [Butalbital-Aspirin-Caffeine] Nausea And Vomiting and Other (See Comments)    SEVERE HEADACHE  . Other     STERI-STRIPS-RASH  . Sulfa Antibiotics Other (See Comments)    Burning esophagus and stomach  . Tramadol Nausea And Vomiting     Past Medical History:  Diagnosis Date  . Cancer ([place])   . Diabetes mellitus without complication ([place])   . Diabetic nephropathy ([place])   . Headache    migraines  . Heart murmur    ASYMPTOMATIC  . Hypertension   . Hypothyroidism   . Seborrheic keratosis   . Vitamin D deficiency      Past Surgical History:  Procedure Laterality Date  . APPENDECTOMY    . BREAST BIOPSY Right 05/28/2017   right breast bx 3 areas invasive mamm ca lymph node mets  . BREAST CYST ASPIRATION Bilateral    neg  .  CHOLECYSTECTOMY    . COLONOSCOPY WITH PROPOFOL N/A 01/01/2015   Procedure: COLONOSCOPY WITH PROPOFOL;  Surgeon: Josefine Class, MD;  [facility] ENDOSCOPY;  Service: Endoscopy;  Laterality: N/A;  . EYE SURGERY     eyelid  . PORTACATH PLACEMENT Right 06/10/2017   Procedure: INSERTION PORT-A-CATH;  Surgeon:  Herbert Pun, MD;  Location: [facility] ORS;  Service: General;  Laterality: Right;  . STENT PLACEMENT ILIAC ([place] HX)    . TUBAL LIGATION      Social History   Socioeconomic History  . Marital status: Married    Spouse name: Not on file  . Number of children: Not on file  . Years of education: Not on file  . Highest education level: Not on file  Social Needs  . Financial resource strain: Not on file  . Food insecurity - worry: Not on file  . Food insecurity - inability: Not on file  . Transportation needs - medical: Not on file  . Transportation needs - non-medical: Not on file  Occupational History  . Not on file  Tobacco Use  . Smoking status: Former Smoker    Packs/day: 2.00    Years: 10.00    Pack years: 20.00    Types: Cigarettes    Last attempt to quit: 04/21/1972    Years since quitting: 45.2  . Smokeless tobacco: Never Used  Substance and Sexual Activity  . Alcohol use: No    Frequency: Never  . Drug use: No  . Sexual activity: Not on file  Other Topics Concern  . Not on file  Social History Narrative  . Not on file    Family History  Problem Relation Age of Onset  . Cancer Mother   . Cancer Maternal Aunt   . Breast cancer Neg Hx      Current Outpatient Medications:  .  acetaminophen (TYLENOL) 500 MG tablet, Take 1,000 mg by mouth every 6 (six) hours as needed (for headaches.)., Disp: , Rfl:  .  aspirin EC 81 MG tablet, Take 81 mg by mouth daily., Disp: , Rfl:  .  aspirin-acetaminophen-caffeine (EXCEDRIN MIGRAINE) 250-250-65 MG tablet, Take 2 tablets by mouth every 6 (six) hours as needed for headache., Disp: , Rfl:  .  atorvastatin (LIPITOR) 10 MG tablet, Take 10 mg by mouth every evening. , Disp: , Rfl:  .  Cholecalciferol 2000 UNITS CAPS, Take 4,000 Units by mouth daily. , Disp: , Rfl:  .  colesevelam (WELCHOL) 625 MG tablet, Take 1,875 mg by mouth every evening. , Disp: , Rfl:  .  dexamethasone (DECADRON) 4 MG tablet, Take 2 tablets by mouth once  a day on the day after chemotherapy and then take 2 tablets two times a day for 2 days. Take with food., Disp: 30 tablet, Rfl: 1 .  levofloxacin (LEVAQUIN) 500 MG tablet, Take 1 tablet (500 mg total) by mouth daily., Disp: 10 tablet, Rfl: 0 .  levothyroxine (SYNTHROID, LEVOTHROID) 50 MCG tablet, Take 50 mcg by mouth daily before breakfast., Disp: , Rfl:  .  lidocaine-prilocaine (EMLA) cream, Apply 1 application topically as needed., Disp: 30 g, Rfl: 6 .  LORazepam (ATIVAN) 0.5 MG tablet, Take 1 tablet (0.5 mg total) by mouth every 6 (six) hours as needed (Nausea or vomiting)., Disp: 30 tablet, Rfl: 0 .  Melatonin 5 MG TABS, Take 5 mg by mouth at bedtime as needed (for sleep.)., Disp: , Rfl:  .  metFORMIN (GLUCOPHAGE) 1000 MG tablet, Take 1,000 mg by mouth 2 (two)  times daily., Disp: , Rfl:  .  ondansetron (ZOFRAN) 8 MG tablet, Take 1 tablet (8 mg total) by mouth 2 (two) times daily as needed. Start on the third day after chemotherapy., Disp: 30 tablet, Rfl: 1 .  quinapril (ACCUPRIL) 5 MG tablet, Take 5 mg by mouth every morning. , Disp: , Rfl:  .  SODIUM BICARBONATE PO, Take 1,300 mg by mouth at bedtime., Disp: , Rfl:   Physical exam:  Vitals:   06/26/17 1427  BP: (!) 144/76  Pulse: 89  Resp: 16  Temp: 98.3 F (36.8 C)  TempSrc: Oral   Physical Exam  Constitutional: She is oriented to person, place, and time and well-developed, well-nourished, and in no distress. Vital signs are normal.  HENT:  Head: Normocephalic and atraumatic.  Eyes: Pupils are equal, round, and reactive to light.  Neck: Normal range of motion.  Cardiovascular: Normal rate, regular rhythm and normal heart sounds.  No murmur heard. Pulmonary/Chest: Effort normal and breath sounds normal. She has no wheezes.  Abdominal: Soft. Normal appearance and bowel sounds are normal. She exhibits no distension. There is no tenderness.  Musculoskeletal: Normal range of motion. She exhibits no edema.  Neurological: She is alert  and oriented to person, place, and time. Gait normal.  Skin: Skin is warm and dry. No rash noted.  Psychiatric: Mood, memory, affect and judgment normal.     CMP Latest Ref Rng & Units 06/25/2017  Glucose 65 - 99 mg/dL 201(H)  BUN 6 - 20 mg/dL 15  Creatinine 0.44 - 1.00 mg/dL 0.80  Sodium 135 - 145 mmol/L 132(L)  Potassium 3.5 - 5.1 mmol/L 4.3  Chloride 101 - 111 mmol/L 101  CO2 22 - 32 mmol/L 23  Calcium 8.9 - 10.3 mg/dL 9.1  Total Protein 6.5 - 8.1 g/dL -  Total Bilirubin 0.3 - 1.2 mg/dL -  Alkaline Phos 38 - 126 U/L -  AST 15 - 41 U/L -  ALT 14 - 54 U/L -   CBC Latest Ref Rng & Units 06/25/2017  WBC 3.6 - 11.0 K/uL 20.0(H)  Hemoglobin 12.0 - 16.0 g/dL 12.8  Hematocrit 35.0 - 47.0 % 38.1  Platelets 150 - 440 K/uL 172    No images are attached to the encounter.  Dg Chest Port 1 View  Result Date: 06/10/2017 CLINICAL DATA:  Porta catheter placement EXAM: PORTABLE CHEST 1 VIEW COMPARISON:  None. FINDINGS: Left IJ porta catheter without kink. Tip is at the upper cavoatrial junction. There is no pneumothorax or mediastinal widening. Heart size is normal. The lungs are clear. IMPRESSION: Left porta catheter with tip at the upper cavoatrial junction. No evidence of cardiopulmonary disease. Electronically Signed   By: Monte Fantasia M.D.   On: 06/10/2017 11:50   Dg C-arm 1-60 Min-no Report  Result Date: 06/10/2017 Fluoroscopy was utilized by the requesting physician.  No radiographic interpretation.   Mm Clip Placement Right  Result Date: 05/28/2017 CLINICAL DATA:  72 year old patient had 2 ultrasound-guided right breast biopsies and 1 ultrasound-guided biopsy of a suspicious right axillary lymph node. EXAM: DIAGNOSTIC RIGHT MAMMOGRAM POST ULTRASOUND BIOPSIES COMPARISON:  Previous exam(s). FINDINGS: Mammographic images were obtained following ultrasound-guided biopsies of 2 separate masses in the 10:30 position of the right breast and of a suspicious lymph node in the far lateral right  axilla. A wing shaped biopsy clip is satisfactorily positioned within the suspicious mass at 10:30 position 4 cm from the nipple. A coil shaped biopsy clip is satisfactorily positioned within the suspicious  mass at 10:30 position 5 cm from the nipple. These 2 biopsy clips are separated by 1 cm. A HydroMARK biopsy clip is seen within the enlarged right axillary lymph node on the MLO projection. IMPRESSION: Satisfactory position of 2 biopsy clips in the right breast and 1 biopsy clip in the right axillary lymph node. Final Assessment: Post Procedure Mammograms for Marker Placement Electronically Signed   By: Curlene Dolphin M.D.   On: 05/28/2017 09:41   [place] Rt Breast Bx W Loc Dev 1st Lesion Img Bx Spec US Guide  Addendum Date: 06/01/2017   ADDENDUM REPORT: 05/29/2017 16:49 ADDENDUM: Pathology of the right breast biopsy, 10:30 o'clock, 4 cm from nipple revealed A. RIGHT BREAST, 10:30, 4 CMFN; ULTRASOUND-GUIDED BIOPSY: INVASIVE MAMMARY CARCINOMA OF NO SPECIAL TYPE. Size of invasive carcinoma: 0.8 cm in this sample. Histologic grade of invasive carcinoma: Grade 2. ER/PR/HER2: Immunohistochemistry will be performed with reflex to [place] for HER2 2+ (equivocal) results. The results will be reported in an addendum. This was found to be concordant with Dr. Theodosia Blender impression and notes and confirmed by Dr. Augustin Coupe. Recommendation: Surgical and oncology referrals. The patient came in to the [facility] on 05/29/17 with concerns of possible bleeding at the biopsy site. Dr. Augustin Coupe examined the patient and new dressings were applied. There was no bleeding or hematoma and biopsy sites were healing well. Post biopsy instructions were reviewed with the patient and her questions were answered. At the patient's request, results and recommendations were relayed to the patient by phone by Dr. Augustin Coupe on 05/29/17 at 4:00 PM. All of her questions were answered. She will be contacted by the nurse navigators for [facility]  who will arrange her referrals. Request for referrals was relayed to Al Pimple, RN and Tanya Nones, RN, nurse navigators for [facility] by [place], [place] on 05/29/17. Addendum by Jetta Lout, RRA on 05/29/17. Electronically Signed   By: Abelardo Diesel M.D.   On: 05/29/2017 16:49   Addendum Date: 05/28/2017   ADDENDUM REPORT: 05/28/2017 10:28 ADDENDUM: Prior to performing the patient's biopsies today, she mentioned to me that she has a palpable area in the upper inner right breast, slightly lateral to a cutaneous scar she has. The cutaneous scar is from excision of a large palpable an infected sebaceous cyst in the past, per patient report. Therefore, prior to performing the biopsies today, I evaluated the area of patient concern with ultrasound, at 2 o'clock position parasternal. I believe that the patient is palpating normal subcutaneous tissue which overlies a prominent costochondral junction. She may be palpating the costochondral junction. However, along the lateral aspect of the cutaneous scar is a small sebaceous cyst measuring 0.6 x 0.4 cm in the antiradial projection. There is no color Doppler flow within the sebaceous cyst. I do not believe that the sebaceous cyst is palpable. No suspicious findings in the upper inner quadrant in the region of patient concern. Electronically Signed   By: Curlene Dolphin M.D.   On: 05/28/2017 10:28   Result Date: 06/01/2017 CLINICAL DATA:  Ultrasound-guided biopsy was recommended of a right breast mass at 10:30 position 4 cm from the nipple. Please note that the patient had a total of 3 right breast/axillary biopsies performed today, each of which is dictated separately. EXAM: ULTRASOUND GUIDED RIGHT BREAST CORE NEEDLE BIOPSY COMPARISON:  Previous exam(s). FINDINGS: I met with the patient and we discussed the procedure of ultrasound-guided biopsy, including benefits and alternatives. We discussed the high  likelihood of a successful procedure. We  discussed the risks of the procedure, including infection, bleeding, tissue injury, clip migration, and inadequate sampling. Informed written consent was given. The usual time-out protocol was performed immediately prior to the procedure. Lesion quadrant: Upper outer quadrant Using sterile technique and 1% Lidocaine as local anesthetic, under direct ultrasound visualization, a 12 gauge spring-loaded device was used to perform biopsy of a suspicious hypoechoic mass at 10:30 position 4 cm from the nipple using a lateral approach. At the conclusion of the procedure a wing shaped tissue marker clip was deployed into the biopsy cavity. Follow up 2 view mammogram was performed and dictated separately. IMPRESSION: Ultrasound guided biopsy of the right breast. No apparent complications. Electronically Signed: By: Curlene Dolphin M.D. On: 05/28/2017 09:43   [place] Rt Breast Bx W Loc Dev Ea Add Lesion Img Bx Spec US Guide  Addendum Date: 06/01/2017   ADDENDUM REPORT: 05/29/2017 16:55 ADDENDUM: Pathology of the right axillary lymph node biopsy revealed C. LYMPH NODE, RIGHT AXILLARY; ULTRASOUND-GUIDED BIOPSY: METASTATIC CARCINOMA OF BREAST ORIGIN. This was found to be concordant with Dr. Theodosia Blender impression and notes and confirmed by Dr. Augustin Coupe. Recommendation: Surgical and oncology referrals. The patient was seen at the [facility] by Dr. Augustin Coupe on 05/29/17 with concerns of possible bleeding at the biopsy site. The site was examined and a new dressing applied. There was no bleeding or hematoma. Post biopsy instructions were reviewed with the patient and questions were answered. At the patient's request, results and recommendations were relayed to the patient by phone by Dr. Augustin Coupe on 05/29/17 at 4:00 PM. All of her questions were answered. She will be contacted by the nurse navigators for [facility] to arrange her referrals. The patient was encouraged to call the [facility] with any further  questions or concerns. Request for referrals was relayed to Tanya Nones, RN and Al Pimple, RN, nurse navigators for [facility] by Jetta Lout, [place] on 05/29/17. Addendum by Jetta Lout, RRA on 05/29/17. Electronically Signed   By: Abelardo Diesel M.D.   On: 05/29/2017 16:55   Result Date: 06/01/2017 CLINICAL DATA:  Ultrasound-guided biopsy of a suspicious right axillary lymph node was recommended. EXAM: ULTRASOUND GUIDED CORE NEEDLE BIOPSY OF A RIGHT AXILLARY NODE COMPARISON:  Previous exam(s). FINDINGS: I met with the patient and we discussed the procedure of ultrasound-guided biopsy, including benefits and alternatives. We discussed the high likelihood of a successful procedure. We discussed the risks of the procedure, including infection, bleeding, tissue injury, clip migration, and inadequate sampling. Informed written consent was given. The usual time-out protocol was performed immediately prior to the procedure. Using sterile technique and 1% Lidocaine as local anesthetic, under direct ultrasound visualization, a 14 gauge spring-loaded device was used to perform biopsy of an enlarged lymph node with thickened hypoechoic cortex in the inferior and lateral right axilla using a lateral approach. At the conclusion of the procedure a HydroMARK tissue marker clip was deployed into the biopsy cavity. Follow up 2 view mammogram was performed and dictated separately. IMPRESSION: Ultrasound guided biopsy of a right axillary lymph node. No apparent complications. Electronically Signed: By: Curlene Dolphin M.D. On: 05/28/2017 09:45   [place] Rt Breast Bx W Loc Dev Ea Add Lesion Img Bx Spec US Guide  Addendum Date: 06/01/2017   ADDENDUM REPORT: 05/29/2017 17:01 ADDENDUM: Pathology of the right breast biopsy, 10:30 o'clock, 5 cm from nipple revealed B. RIGHT BREAST, 10:30, 5 CMFN; ULTRASOUND-GUIDED BIOPSY: INVASIVE  MAMMARY CARCINOMA OF NO SPECIAL TYPE. Size of invasive carcinoma: 0.7 cm in this sample.  Histologic grade of invasive carcinoma: Grade 2. This was found to be concordant with Dr. Theodosia Blender impression and notes and confirmed by Dr. Augustin Coupe. Recommendation: Surgical and oncology referrals. The patient came in to the [facility] on 05/29/17 with concerns of possible bleeding at the biopsy site. Dr. Augustin Coupe examined the patient and new dressings were applied. There was no bleeding or hematoma. Post biopsy instructions were reviewed with the patient and all of her questions were answered. At the patient's request, results and recommendations were relayed to the patient by phone by Dr. Augustin Coupe on 05/29/17 at 4:00 PM. All of her questions were answered. She will be contacted by the nurse navigators for [facility] to arrange referrals. Request for referrals was relayed to Al Pimple, RN and Tanya Nones, RN, nurse navigators for [facility] by [place], [place] on 05/29/17. Addendum by Jetta Lout, RRA on 05/29/17. Electronically Signed   By: Abelardo Diesel M.D.   On: 05/29/2017 17:01   Result Date: 06/01/2017 CLINICAL DATA:  Ultrasound-guided biopsy was recommended of a suspicious mass at 10:30 position right breast 5 cm from the nipple. EXAM: ULTRASOUND GUIDED RIGHT BREAST CORE NEEDLE BIOPSY COMPARISON:  Previous exam(s). FINDINGS: I met with the patient and we discussed the procedure of ultrasound-guided biopsy, including benefits and alternatives. We discussed the high likelihood of a successful procedure. We discussed the risks of the procedure, including infection, bleeding, tissue injury, clip migration, and inadequate sampling. Informed written consent was given. The usual time-out protocol was performed immediately prior to the procedure. Lesion quadrant: Upper outer quadrant Using sterile technique and 1% Lidocaine as local anesthetic, under direct ultrasound visualization, a 12 gauge spring-loaded device was used to perform biopsy of a suspicious hypoechoic mass  at 10:30 position 5 cm from the nipple using a lateral approach. At the conclusion of the procedure a coil shaped tissue marker clip was deployed into the biopsy cavity. Follow up 2 view mammogram was performed and dictated separately. IMPRESSION: Ultrasound guided biopsy of the right breast. No apparent complications. Electronically Signed: By: Curlene Dolphin M.D. On: 05/28/2017 09:43     Assessment and plan- Patient is a 72 y.o. female he presents for reevaluation of fever and cough.  1. Triple Negative Right Breast Cancer: S/P Cycle 1 Cytoxan and Adriamycin with neulasta support. Continue follow-up as scheduled to see Dr. Mike Gip.    2. Fever/Cough: She has been afebrile since her visit here yesterday. Afebrile during visit today. States she is feeling "much better". She has been taking tylenol every 8 hours. Denies worsening symptoms. Tolerating po Levaquin well. No side effects. Blood cultures negative. Heart rate normal. Continue Tylenol for fevers. If symptoms worsen, she can call clinic. RTC as scheduled to see Dr. Mike Gip next week.    Visit Diagnosis 1. Malignant neoplasm of upper-inner quadrant of right breast in female, estrogen receptor negative ([place])   2. Fever, unspecified fever cause     Patient expressed understanding and was in agreement with this plan. She also understands that She can call clinic at any time with any questions, concerns, or complaints.   Greater than 50% was spent in counseling and coordination of care with this patient including but not limited to discussion of the relevant topics above (See A&P) including, but not limited to diagnosis and management of acute and chronic medical conditions.   Faythe Casa, AGNP-[facility]  at [place]- [phone] Pager- [phone] 06/26/2017 2:33 PM

## 2017-06-28 ENCOUNTER — Emergency Department: Payer: Medicare Other

## 2017-06-28 ENCOUNTER — Other Ambulatory Visit: Payer: Self-pay

## 2017-06-28 ENCOUNTER — Encounter: Payer: Self-pay | Admitting: Emergency Medicine

## 2017-06-28 ENCOUNTER — Inpatient Hospital Stay
Admission: EM | Admit: 2017-06-28 | Discharge: 2017-07-01 | DRG: 871 | Disposition: A | Discharge status: Home / Self Care | Payer: Medicare Other | Attending: Internal Medicine | Admitting: Internal Medicine

## 2017-06-28 DIAGNOSIS — Z66 Do not resuscitate: Secondary | ICD-10-CM | POA: Diagnosis present

## 2017-06-28 DIAGNOSIS — R5381 Other malaise: Secondary | ICD-10-CM

## 2017-06-28 DIAGNOSIS — Z7982 Long term (current) use of aspirin: Secondary | ICD-10-CM | POA: Diagnosis not present

## 2017-06-28 DIAGNOSIS — D701 Agranulocytosis secondary to cancer chemotherapy: Secondary | ICD-10-CM | POA: Diagnosis not present

## 2017-06-28 DIAGNOSIS — D6959 Other secondary thrombocytopenia: Secondary | ICD-10-CM | POA: Diagnosis not present

## 2017-06-28 DIAGNOSIS — A419 Sepsis, unspecified organism: Secondary | ICD-10-CM | POA: Diagnosis present

## 2017-06-28 DIAGNOSIS — Z888 Allergy status to other drugs, medicaments and biological substances status: Secondary | ICD-10-CM

## 2017-06-28 DIAGNOSIS — T451X5A Adverse effect of antineoplastic and immunosuppressive drugs, initial encounter: Secondary | ICD-10-CM | POA: Diagnosis present

## 2017-06-28 DIAGNOSIS — C50411 Malignant neoplasm of upper-outer quadrant of right female breast: Secondary | ICD-10-CM | POA: Diagnosis not present

## 2017-06-28 DIAGNOSIS — E039 Hypothyroidism, unspecified: Secondary | ICD-10-CM | POA: Diagnosis present

## 2017-06-28 DIAGNOSIS — Z79890 Hormone replacement therapy (postmenopausal): Secondary | ICD-10-CM

## 2017-06-28 DIAGNOSIS — Z87891 Personal history of nicotine dependence: Secondary | ICD-10-CM

## 2017-06-28 DIAGNOSIS — R5383 Other fatigue: Secondary | ICD-10-CM

## 2017-06-28 DIAGNOSIS — Z7984 Long term (current) use of oral hypoglycemic drugs: Secondary | ICD-10-CM | POA: Diagnosis not present

## 2017-06-28 DIAGNOSIS — R05 Cough: Secondary | ICD-10-CM

## 2017-06-28 DIAGNOSIS — J1000 Influenza due to other identified influenza virus with unspecified type of pneumonia: Secondary | ICD-10-CM | POA: Diagnosis present

## 2017-06-28 DIAGNOSIS — Z882 Allergy status to sulfonamides status: Secondary | ICD-10-CM | POA: Diagnosis not present

## 2017-06-28 DIAGNOSIS — E785 Hyperlipidemia, unspecified: Secondary | ICD-10-CM | POA: Diagnosis present

## 2017-06-28 DIAGNOSIS — Z79899 Other long term (current) drug therapy: Secondary | ICD-10-CM

## 2017-06-28 DIAGNOSIS — C50911 Malignant neoplasm of unspecified site of right female breast: Secondary | ICD-10-CM

## 2017-06-28 DIAGNOSIS — C779 Secondary and unspecified malignant neoplasm of lymph node, unspecified: Secondary | ICD-10-CM | POA: Diagnosis present

## 2017-06-28 DIAGNOSIS — Z171 Estrogen receptor negative status [ER-]: Secondary | ICD-10-CM

## 2017-06-28 DIAGNOSIS — I10 Essential (primary) hypertension: Secondary | ICD-10-CM | POA: Diagnosis present

## 2017-06-28 DIAGNOSIS — C50211 Malignant neoplasm of upper-inner quadrant of right female breast: Secondary | ICD-10-CM | POA: Diagnosis present

## 2017-06-28 DIAGNOSIS — D696 Thrombocytopenia, unspecified: Secondary | ICD-10-CM

## 2017-06-28 DIAGNOSIS — J101 Influenza due to other identified influenza virus with other respiratory manifestations: Secondary | ICD-10-CM | POA: Diagnosis not present

## 2017-06-28 DIAGNOSIS — C50811 Malignant neoplasm of overlapping sites of right female breast: Secondary | ICD-10-CM

## 2017-06-28 DIAGNOSIS — R059 Cough, unspecified: Secondary | ICD-10-CM

## 2017-06-28 LAB — CBC WITH DIFFERENTIAL/PLATELET
BASOS PCT: 0 %
Basophils Absolute: 0 10*3/uL (ref 0–0.1)
EOS PCT: 0 %
Eosinophils Absolute: 0 10*3/uL (ref 0–0.7)
HCT: 39.6 % (ref 35.0–47.0)
Hemoglobin: 13.2 g/dL (ref 12.0–16.0)
LYMPHS ABS: 0.2 10*3/uL — AB (ref 1.0–3.6)
Lymphocytes Relative: 6 %
MCH: 29.9 pg (ref 26.0–34.0)
MCHC: 33.3 g/dL (ref 32.0–36.0)
MCV: 89.6 fL (ref 80.0–100.0)
MONO ABS: 0 10*3/uL — AB (ref 0.2–0.9)
MONOS PCT: 1 %
NEUTROS ABS: 3.1 10*3/uL (ref 1.4–6.5)
Neutrophils Relative %: 93 %
PLATELETS: 111 10*3/uL — AB (ref 150–440)
RBC: 4.42 MIL/uL (ref 3.80–5.20)
RDW: 13.7 % (ref 11.5–14.5)
WBC: 3.3 10*3/uL — ABNORMAL LOW (ref 3.6–11.0)

## 2017-06-28 LAB — URINALYSIS, COMPLETE (UACMP) WITH MICROSCOPIC
BACTERIA UA: NONE SEEN
Bilirubin Urine: NEGATIVE
Glucose, UA: NEGATIVE mg/dL
HGB URINE DIPSTICK: NEGATIVE
Ketones, ur: NEGATIVE mg/dL
Leukocytes, UA: NEGATIVE
Nitrite: NEGATIVE
PROTEIN: NEGATIVE mg/dL
Specific Gravity, Urine: 1.01 (ref 1.005–1.030)
Squamous Epithelial / LPF: NONE SEEN
pH: 7 (ref 5.0–8.0)

## 2017-06-28 LAB — CBC
HCT: 34.7 % — ABNORMAL LOW (ref 35.0–47.0)
HEMOGLOBIN: 11.6 g/dL — AB (ref 12.0–16.0)
MCH: 29.9 pg (ref 26.0–34.0)
MCHC: 33.4 g/dL (ref 32.0–36.0)
MCV: 89.6 fL (ref 80.0–100.0)
Platelets: 95 10*3/uL — ABNORMAL LOW (ref 150–440)
RBC: 3.88 MIL/uL (ref 3.80–5.20)
RDW: 13.6 % (ref 11.5–14.5)
WBC: 1.9 10*3/uL — ABNORMAL LOW (ref 3.6–11.0)

## 2017-06-28 LAB — COMPREHENSIVE METABOLIC PANEL
ALT: 19 U/L (ref 14–54)
AST: 22 U/L (ref 15–41)
Albumin: 4.1 g/dL (ref 3.5–5.0)
Alkaline Phosphatase: 57 U/L (ref 38–126)
Anion gap: 10 (ref 5–15)
BUN: 12 mg/dL (ref 6–20)
CALCIUM: 8.7 mg/dL — AB (ref 8.9–10.3)
CHLORIDE: 99 mmol/L — AB (ref 101–111)
CO2: 24 mmol/L (ref 22–32)
CREATININE: 0.82 mg/dL (ref 0.44–1.00)
GFR calc non Af Amer: >60 mL/min (ref >60)
GLUCOSE: 130 mg/dL — AB (ref 65–99)
Potassium: 3.6 mmol/L (ref 3.5–5.1)
SODIUM: 133 mmol/L — AB (ref 135–145)
Total Bilirubin: 0.9 mg/dL (ref 0.3–1.2)
Total Protein: 7.1 g/dL (ref 6.5–8.1)

## 2017-06-28 LAB — PROTIME-INR
INR: 1.03
Prothrombin Time: 13.4 seconds (ref 11.4–15.2)

## 2017-06-28 LAB — CREATININE, SERUM
CREATININE: 0.6 mg/dL (ref 0.44–1.00)
GFR calc Af Amer: >60 mL/min (ref >60)

## 2017-06-28 LAB — GLUCOSE, CAPILLARY
GLUCOSE-CAPILLARY: 141 mg/dL — AB (ref 65–99)
Glucose-Capillary: 115 mg/dL — ABNORMAL HIGH (ref 65–99)

## 2017-06-28 LAB — INFLUENZA PANEL BY PCR (TYPE A & B)
INFLAPCR: POSITIVE — AB
Influenza B By PCR: NEGATIVE

## 2017-06-28 LAB — LACTIC ACID, PLASMA: Lactic Acid, Venous: 1.3 mmol/L (ref 0.5–1.9)

## 2017-06-28 LAB — PROCALCITONIN: Procalcitonin: <0.1 ng/mL

## 2017-06-28 LAB — LIPASE, BLOOD: Lipase: 41 U/L (ref 11–51)

## 2017-06-28 LAB — TROPONIN I

## 2017-06-28 LAB — BRAIN NATRIURETIC PEPTIDE: B Natriuretic Peptide: 130 pg/mL — ABNORMAL HIGH (ref 0.0–100.0)

## 2017-06-28 MED ORDER — GUAIFENESIN-DM 100-10 MG/5ML PO SYRP
5.0000 mL | ORAL_SOLUTION | ORAL | Status: DC | PRN
  Administered 2017-06-28 – 2017-07-01 (×6): 5 mL via ORAL
  Filled 2017-06-28 (×9): qty 5

## 2017-06-28 MED ORDER — DOCUSATE SODIUM 100 MG PO CAPS: 100.0000 mg | ORAL_CAPSULE | Freq: Two times a day (BID) | ORAL | Status: DC | PRN

## 2017-06-28 MED ORDER — ATORVASTATIN CALCIUM 20 MG PO TABS
10.0000 mg | ORAL_TABLET | Freq: Every evening | ORAL | Status: DC
  Administered 2017-06-28 – 2017-06-30 (×3): 10 mg via ORAL
  Filled 2017-06-28 (×3): qty 1

## 2017-06-28 MED ORDER — MELATONIN 5 MG PO TABS
5.0000 mg | ORAL_TABLET | Freq: Every evening | ORAL | Status: DC | PRN
  Administered 2017-06-28 – 2017-06-30 (×3): 5 mg via ORAL
  Filled 2017-06-28 (×4): qty 1

## 2017-06-28 MED ORDER — METFORMIN HCL 500 MG PO TABS
1000.0000 mg | ORAL_TABLET | Freq: Two times a day (BID) | ORAL | Status: DC
  Administered 2017-06-28 – 2017-07-01 (×6): 1000 mg via ORAL
  Filled 2017-06-28 (×7): qty 2

## 2017-06-28 MED ORDER — INSULIN ASPART 100 UNIT/ML ~~LOC~~ SOLN
0.0000 [IU] | Freq: Three times a day (TID) | SUBCUTANEOUS | Status: DC
  Administered 2017-06-28: 1 [IU] via SUBCUTANEOUS
  Filled 2017-06-28: qty 1

## 2017-06-28 MED ORDER — SODIUM CHLORIDE 0.9 % IV BOLUS (SEPSIS)
250.0000 mL | Freq: Once | INTRAVENOUS | Status: AC
  Administered 2017-06-28: 250 mL via INTRAVENOUS

## 2017-06-28 MED ORDER — SODIUM CHLORIDE 0.9 % IV BOLUS (SEPSIS)
1000.0000 mL | Freq: Once | INTRAVENOUS | Status: AC
  Administered 2017-06-28: 1000 mL via INTRAVENOUS

## 2017-06-28 MED ORDER — PIPERACILLIN-TAZOBACTAM 3.375 G IVPB 30 MIN
3.3750 g | Freq: Once | INTRAVENOUS | Status: AC
  Administered 2017-06-28: 3.375 g via INTRAVENOUS
  Filled 2017-06-28: qty 50

## 2017-06-28 MED ORDER — ONDANSETRON HCL 4 MG PO TABS: 4.0000 mg | ORAL_TABLET | Freq: Three times a day (TID) | ORAL | Status: DC | PRN

## 2017-06-28 MED ORDER — VITAMIN D 1000 UNITS PO TABS
4000.0000 [IU] | ORAL_TABLET | Freq: Every day | ORAL | Status: DC
  Administered 2017-06-28 – 2017-07-01 (×4): 4000 [IU] via ORAL
  Filled 2017-06-28 (×4): qty 4

## 2017-06-28 MED ORDER — OSELTAMIVIR PHOSPHATE 75 MG PO CAPS
75.0000 mg | ORAL_CAPSULE | Freq: Two times a day (BID) | ORAL | Status: DC
Start: 2017-06-28 — End: 2017-07-01
  Administered 2017-06-28 – 2017-07-01 (×7): 75 mg via ORAL
  Filled 2017-06-28 (×8): qty 1

## 2017-06-28 MED ORDER — VANCOMYCIN HCL IN DEXTROSE 1-5 GM/200ML-% IV SOLN
1000.0000 mg | Freq: Once | INTRAVENOUS | Status: AC
  Administered 2017-06-28: 1000 mg via INTRAVENOUS
  Filled 2017-06-28: qty 200

## 2017-06-28 MED ORDER — LISINOPRIL 5 MG PO TABS
5.0000 mg | ORAL_TABLET | Freq: Every day | ORAL | Status: DC
  Administered 2017-06-28 – 2017-07-01 (×4): 5 mg via ORAL
  Filled 2017-06-28 (×4): qty 1

## 2017-06-28 MED ORDER — LEVOTHYROXINE SODIUM 50 MCG PO TABS
50.0000 ug | ORAL_TABLET | Freq: Every day | ORAL | Status: DC
  Administered 2017-06-29 – 2017-07-01 (×3): 50 ug via ORAL
  Filled 2017-06-28 (×3): qty 1

## 2017-06-28 MED ORDER — HEPARIN SODIUM (PORCINE) 5000 UNIT/ML IJ SOLN
5000.0000 [IU] | Freq: Three times a day (TID) | INTRAMUSCULAR | Status: DC
  Administered 2017-06-28 – 2017-07-01 (×6): 5000 [IU] via SUBCUTANEOUS
  Filled 2017-06-28 (×7): qty 1

## 2017-06-28 MED ORDER — ACETAMINOPHEN 500 MG PO TABS
1000.0000 mg | ORAL_TABLET | Freq: Once | ORAL | Status: AC
  Administered 2017-06-28: 1000 mg via ORAL
  Filled 2017-06-28: qty 2

## 2017-06-28 MED ORDER — ASPIRIN-ACETAMINOPHEN-CAFFEINE 250-250-65 MG PO TABS
2.0000 | ORAL_TABLET | Freq: Four times a day (QID) | ORAL | Status: DC | PRN
  Filled 2017-06-28: qty 2

## 2017-06-28 MED ORDER — LORAZEPAM 0.5 MG PO TABS: 0.5000 mg | ORAL_TABLET | Freq: Four times a day (QID) | ORAL | Status: DC | PRN

## 2017-06-28 MED ORDER — ACETAMINOPHEN 325 MG PO TABS
650.0000 mg | ORAL_TABLET | Freq: Four times a day (QID) | ORAL | Status: DC | PRN
  Administered 2017-06-28 – 2017-06-30 (×4): 650 mg via ORAL
  Filled 2017-06-28 (×4): qty 2

## 2017-06-28 MED ORDER — COLESEVELAM HCL 625 MG PO TABS
1875.0000 mg | ORAL_TABLET | Freq: Every evening | ORAL | Status: DC
  Administered 2017-06-28 – 2017-06-30 (×3): 1875 mg via ORAL
  Filled 2017-06-28 (×4): qty 3

## 2017-06-28 MED ORDER — ONDANSETRON HCL 4 MG/2ML IJ SOLN: 4.0000 mg | Freq: Four times a day (QID) | INTRAMUSCULAR | Status: DC | PRN

## 2017-06-28 MED ORDER — BISACODYL 10 MG RE SUPP
10.0000 mg | Freq: Every day | RECTAL | Status: DC
  Administered 2017-06-28: 13:00:00 10 mg via RECTAL
  Filled 2017-06-28 (×2): qty 1

## 2017-06-28 MED ORDER — ASPIRIN EC 81 MG PO TBEC
81.0000 mg | DELAYED_RELEASE_TABLET | Freq: Every day | ORAL | Status: DC
  Administered 2017-06-28 – 2017-07-01 (×4): 81 mg via ORAL
  Filled 2017-06-28 (×4): qty 1

## 2017-06-28 NOTE — ED Notes (Signed)
ED Provider at bedside. 

## 2017-06-28 NOTE — ED Provider Notes (Signed)
[facility] Emergency Department Provider Note  ____________________________________________   First MD Initiated Contact with Patient 06/28/17 0630     (approximate)  I have reviewed the triage vital signs and the nursing notes.   HISTORY  Chief Complaint Code Sepsis    HPI Stephanie Sanders is a 72 y.o. female his medical history includes breast cancer for which she just started her first dose of chemotherapy last week, managed by Dr. Mike Gip.  She presents tonight for evaluation of general malaise, fever, productive cough, all of which is been gradually getting worse over about the last 5 days and is currently severe.  Nothing is making her feel better and she feels worse than she did last week.  Shortly after getting her chemotherapy dose she started to develop a cough and low-grade fevers.  Dr. Mike Gip started her on Levaquin but it is not helped.  By tonight she felt general malaise, short of breath, chest pain with cough, and a worsening cough.  She is nauseated but not vomiting.  She denies dysuria.  She has no significant nasal congestion or runny nose.  She denies sore throat.  Upon arrival she was identified as a code sepsis patient for her fever of 102 and her heart rate of 128 as well as being on chemotherapy.  Past Medical History:  Diagnosis Date  . Cancer ([place])   . Diabetes mellitus without complication ([place])   . Diabetic nephropathy ([place])   . Headache    migraines  . Heart murmur    ASYMPTOMATIC  . Hypertension   . Hypothyroidism   . Seborrheic keratosis   . Vitamin D deficiency     Patient Active Problem List   Diagnosis Date Noted  . Sepsis ([place]) 06/28/2017  . Influenza A 06/28/2017  . Encounter for antineoplastic chemotherapy 06/18/2017  . Malignant neoplasm of upper-inner quadrant of right breast in female, estrogen receptor negative ([place]) 06/04/2017    Past Surgical History:  Procedure Laterality Date  . APPENDECTOMY    .  BREAST BIOPSY Right 05/28/2017   right breast bx 3 areas invasive mamm ca lymph node mets  . BREAST CYST ASPIRATION Bilateral    neg  . CHOLECYSTECTOMY    . COLONOSCOPY WITH PROPOFOL N/A 01/01/2015   Procedure: COLONOSCOPY WITH PROPOFOL;  Surgeon: Josefine Class, MD;  Location[facility] ENDOSCOPY;  Service: Endoscopy;  Laterality: N/A;  . EYE SURGERY     eyelid  . PORTACATH PLACEMENT Right 06/10/2017   Procedure: INSERTION PORT-A-CATH;  Surgeon: Herbert Pun, MD;  Location: [facility] ORS;  Service: General;  Laterality: Right;  . STENT PLACEMENT ILIAC ([place] HX)    . TUBAL LIGATION      Prior to Admission medications   Medication Sig Start Date End Date Taking? Authorizing Provider  acetaminophen (TYLENOL) 500 MG tablet Take 1,000 mg by mouth every 6 (six) hours as needed (for headaches.).   Yes [provider]  aspirin EC 81 MG tablet Take 81 mg by mouth daily.   Yes [provider]  aspirin-acetaminophen-caffeine (EXCEDRIN MIGRAINE) [phone] MG tablet Take 2 tablets by mouth every 6 (six) hours as needed for headache.   Yes [provider]  atorvastatin (LIPITOR) 10 MG tablet Take 10 mg by mouth every evening.    Yes [provider]  Cholecalciferol 2000 UNITS CAPS Take 4,000 Units by mouth daily.    Yes [provider]  colesevelam (WELCHOL) 625 MG tablet Take 1,875 mg by mouth every evening.  Yes [provider]  dexamethasone (DECADRON) 4 MG tablet Take 2 tablets by mouth once a day on the day after chemotherapy and then take 2 tablets two times a day for 2 days. Take with food. 06/16/17  Yes Corcoran, Drue Second, MD  levofloxacin (LEVAQUIN) 500 MG tablet Take 1 tablet (500 mg total) by mouth daily. 06/25/17  Yes Burns, Wandra Feinstein, NP  levothyroxine (SYNTHROID, LEVOTHROID) 50 MCG tablet Take 50 mcg by mouth daily before breakfast.   Yes [provider]  lidocaine-prilocaine (EMLA) cream Apply 1 application topically as  needed. 06/11/17  Yes Karen Kitchens, NP  LORazepam (ATIVAN) 0.5 MG tablet Take 1 tablet (0.5 mg total) by mouth every 6 (six) hours as needed (Nausea or vomiting). 06/16/17  Yes Corcoran, Drue Second, MD  Melatonin 5 MG TABS Take 5 mg by mouth at bedtime as needed (for sleep.).   Yes [provider]  metFORMIN (GLUCOPHAGE) 1000 MG tablet Take 1,000 mg by mouth 2 (two) times daily.   Yes [provider]  ondansetron (ZOFRAN) 8 MG tablet Take 1 tablet (8 mg total) by mouth 2 (two) times daily as needed. Start on the third day after chemotherapy. 06/16/17  Yes Corcoran, Drue Second, MD  quinapril (ACCUPRIL) 5 MG tablet Take 5 mg by mouth every morning.    Yes [provider]  sodium bicarbonate 650 MG tablet Take 1,300 mg by mouth at bedtime.   Yes [provider]    Allergies Fiorinal [butalbital-aspirin-caffeine]; Other; Sulfa antibiotics; and Tramadol  Family History  Problem Relation Age of Onset  . Cancer Mother   . Cancer Maternal Aunt   . Breast cancer Neg Hx     Social History Social History   Tobacco Use  . Smoking status: Former Smoker    Packs/day: 2.00    Years: 10.00    Pack years: 20.00    Types: Cigarettes    Last attempt to quit: 04/21/1972    Years since quitting: 45.2  . Smokeless tobacco: Never Used  Substance Use Topics  . Alcohol use: No    Frequency: Never  . Drug use: No    Review of Systems Constitutional: +fever/chills as described above. General malaise Eyes: No visual changes ENT: No sore throat. Cardiovascular: Chest pain associated with cough Respiratory: Productive cough, some shortness of breath Gastrointestinal: No abdominal pain.  Nausea, no vomiting.  No diarrhea.  No constipation. Genitourinary: Negative for dysuria. Musculoskeletal: Negative for neck pain.  Negative for back pain. Integumentary: Negative for rash. Neurological: Negative for headaches, focal weakness or  numbness.   ____________________________________________   PHYSICAL EXAM:  VITAL SIGNS: ED Triage Vitals  Enc Vitals Group     BP 06/28/17 0544 (!) 158/80     Pulse Rate 06/28/17 0544 (!) 128     Resp 06/28/17 0544 (!) 22     Temp 06/28/17 0544 (!) 102 F (38.9 C)     Temp Source 06/28/17 0544 Oral     SpO2 06/28/17 0544 97 %     Weight 06/28/17 0552 70.3 kg (155 lb)     Height 06/28/17 0552 1.727 m (5\' 8" )     Head Circumference --      Peak Flow --      Pain Score --      Pain Loc --      Pain Edu? --      Excl. in [place]? --     Constitutional: Alert and oriented.  Appears ill but  nontoxic Eyes: Conjunctivae are normal.  Head: Atraumatic. Nose: No congestion/rhinnorhea. Mouth/Throat: Mucous membranes are moist.  Oropharynx non-erythematous. Neck: No stridor.  No meningeal signs.   Cardiovascular: Tachycardia, regular rhythm. Good peripheral circulation. Grossly normal heart sounds. Respiratory: Mildly increased respiratory rate but no retractions or increased respiratory effort.   Lungs CTAB. Occasional thick-sounding cough. Gastrointestinal: Soft and nontender. No distention.  Musculoskeletal: No lower extremity tenderness nor edema. No gross deformities of extremities. Neurologic:  Normal speech and language. No gross focal neurologic deficits are appreciated.  Skin:  Skin is warm, dry and intact. No rash noted. Psychiatric: Mood and affect are normal. Speech and behavior are normal.  ____________________________________________   LABS (all labs ordered are listed, but only abnormal results are displayed)  Labs Reviewed  COMPREHENSIVE METABOLIC PANEL - Abnormal; Notable for the following components:      Result Value   Sodium 133 (*)    Chloride 99 (*)    Glucose, Bld 130 (*)    Calcium 8.7 (*)    All other components within normal limits  CBC WITH DIFFERENTIAL/PLATELET - Abnormal; Notable for the following components:   WBC 3.3 (*)    Platelets 111 (*)     Lymphs Abs 0.2 (*)    Monocytes Absolute 0.0 (*)    All other components within normal limits  INFLUENZA PANEL BY PCR (TYPE A & B) - Abnormal; Notable for the following components:   Influenza A By PCR POSITIVE (*)    All other components within normal limits  BRAIN NATRIURETIC PEPTIDE - Abnormal; Notable for the following components:   B Natriuretic Peptide 130.0 (*)    All other components within normal limits  CULTURE, BLOOD (ROUTINE X 2)  CULTURE, BLOOD (ROUTINE X 2)  URINE CULTURE  LACTIC ACID, PLASMA  PROTIME-INR  LIPASE, BLOOD  TROPONIN I  PROCALCITONIN  LACTIC ACID, PLASMA  URINALYSIS, COMPLETE (UACMP) WITH MICROSCOPIC   ____________________________________________  EKG  ED ECG REPORT I, Hinda Kehr, the attending physician, personally viewed and interpreted this ECG.  Date: 06/28/2017 EKG Time: 6:01 AM Rate: 109 Rhythm: Sinus tachycardia QRS Axis: normal Intervals: normal ST/T Wave abnormalities: Non-specific ST segment / T-wave changes, but no evidence of acute ischemia. Narrative Interpretation: no evidence of acute ischemia   ____________________________________________  RADIOLOGY I, Hinda Kehr, personally viewed and evaluated these images (plain radiographs) as part of my medical decision making, as well as reviewing the written report by the radiologist.  ED MD interpretation:  No acute infiltrates identified on CXR  Official radiology report(s): Dg Chest 2 View  Result Date: 06/28/2017 CLINICAL DATA:  Acute onset of fever and generalized weakness. Patient on chemotherapy for breast cancer. EXAM: CHEST - 2 VIEW COMPARISON:  Chest radiograph performed 06/10/2017 FINDINGS: The lungs are well-aerated and clear. There is no evidence of focal opacification, pleural effusion or pneumothorax. The heart is normal in size; the mediastinal contour is within normal limits. A left-sided chest port is noted ending about the distal SVC. No acute osseous abnormalities  are seen. Clips are noted within the right upper quadrant, reflecting prior cholecystectomy. IMPRESSION: No acute cardiopulmonary process seen. Electronically Signed   By: Garald Balding M.D.   On: 06/28/2017 06:38    ____________________________________________   PROCEDURES  Critical Care performed: Yes, see critical care procedure note(s)   Procedure(s) performed:   .Critical Care Performed by: Hinda Kehr, MD Authorized by: Hinda Kehr, MD   Critical care provider statement:    Critical care time (minutes):  30   Critical care time was exclusive of:  Separately billable procedures and treating other patients   Critical care was necessary to treat or prevent imminent or life-threatening deterioration of the following conditions:  Sepsis   Critical care was time spent personally by me on the following activities:  Development of treatment plan with patient or surrogate, discussions with consultants, evaluation of patient's response to treatment, examination of patient, obtaining history from patient or surrogate, ordering and performing treatments and interventions, ordering and review of laboratory studies, ordering and review of radiographic studies, pulse oximetry, re-evaluation of patient's condition and review of old charts     ____________________________________________   INITIAL IMPRESSION / [place] / ED COURSE  As part of my medical decision making, I reviewed the following data within the [place] notes reviewed and incorporated, Labs reviewed , EKG interpreted , Radiograph reviewed  and Discussed with admitting physician     Differential diagnosis includes, but is not limited to, sepsis, pneumonia, urinary tract infection, meningitis, intra-abdominal, viral illness including but not limited to influenza.  The patient's presentation is most concerning for pneumonia given the immunosuppression and increasingly productive cough.   I made her a code sepsis and treated with fluids and empiric antibiotics.  Her CBC and CMP are relatively reassuring.  At the time I am writing this note, differential is pending, and the patient has very mild leukopenia at 3.3.  No evidence of pneumonia on chest x-ray although it is possible it is just not yet showing up on a plain film.  Patient is hemodynamically stable.  I discussed the case with the hospitalist, Dr. Tressia Miners, who will admit.  I updated the family as well.  7:57 AM: Patient is influenza A positive.  I will try to let the hospitalist know and I am ordering Tamiflu.  Clinical Course as of Jun 28 837  Truddie Hidden I attempted to reach Dr. Grayland Ormond with multiple pages to let him know about the patient so he could pass along the word to Dr. Mike Gip, but he was unavailable by page of this morning.  I did discuss the case in person with the hospitalist who will admit.  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  Cough  Malaise and fatigue  Influenza A     MEDICATIONS GIVEN DURING THIS VISIT:  Medications  sodium chloride 0.9 % bolus 1,000 mL (1,000 mLs Intravenous New Bag/Given 06/28/17 0714)    And  sodium chloride 0.9 % bolus 1,000 mL (0 mLs Intravenous Stopped 06/28/17 0829)    And  sodium chloride 0.9 % bolus 250 mL (not administered)  oseltamivir (TAMIFLU) capsule 75 mg (75 mg Oral Given 06/28/17 0808)  piperacillin-tazobactam (ZOSYN) IVPB 3.375 g (0 g Intravenous Stopped 06/28/17 0803)  vancomycin (VANCOCIN) IVPB 1000 mg/200 mL premix (0 mg Intravenous Stopped 06/28/17 0829)  acetaminophen (TYLENOL) tablet 1,000 mg (1,000 mg Oral Given 06/28/17 0709)     ED Discharge Orders    None       Note:  This document was prepared using Dragon voice  recognition software and may include unintentional dictation errors.    Hinda Kehr, MD 06/28/17 [phone]

## 2017-06-28 NOTE — Progress Notes (Signed)
PT Cancellation Note  Patient Details Name: Stephanie Sanders MRN: [id number] DOB: 1945/05/05   Cancelled Treatment:    Reason Eval/Treat Not Completed: Other (comment).  Pt has been up to [place] alone with no AD, but agreed to having PT ck on her tomorrow to ensure she is still getting around fine.   Ramond Dial 06/28/2017, 3:06 PM   Mee Hives, PT MS Acute Rehab Dept. Number: [place] and [place]

## 2017-06-28 NOTE — H&P (Signed)
[facility] NAME: Stephanie Sanders    MR#:  [id number]  DATE OF BIRTH:  1946/01/05  DATE OF ADMISSION:  06/28/2017  PRIMARY CARE PHYSICIAN: Baxter Hire, MD   REQUESTING/REFERRING PHYSICIAN: Karma Greaser  CHIEF COMPLAINT:   Chief Complaint  Patient presents with  . Code Sepsis    HISTORY OF PRESENT ILLNESS: Stephanie Sanders  is a 72 y.o. female with a known history of breast cancer, diabetes, diabetic neuropathy, headache, hypertension, hypothyroidism, vitamin D deficiency- received her first chemotherapy last week. She had fever after 2 days of chemotherapy and she went to [place], they suspected it may be pneumonia and give her oral Levaquin tablets which she is taking for last 2 days. She also had symptoms of having fever, cough, shortness of breath. Continued to feel generalized weakness in spite of taking antibiotics so came to emergency room today. Noted to have no pneumonia on chest x-ray, but her flu test was positive, she was also noted to have tachycardia, tachypnea, fever so ER physician suggested to admit to hospitalist team as she is immunosuppressed due to her chemotherapy.   PAST MEDICAL HISTORY:   Past Medical History:  Diagnosis Date  . Cancer ([place])   . Diabetes mellitus without complication ([place])   . Diabetic nephropathy ([place])   . Headache    migraines  . Heart murmur    ASYMPTOMATIC  . Hypertension   . Hypothyroidism   . Seborrheic keratosis   . Vitamin D deficiency     PAST SURGICAL HISTORY:  Past Surgical History:  Procedure Laterality Date  . APPENDECTOMY    . BREAST BIOPSY Right 05/28/2017   right breast bx 3 areas invasive mamm ca lymph node mets  . BREAST CYST ASPIRATION Bilateral    neg  . CHOLECYSTECTOMY    . COLONOSCOPY WITH PROPOFOL N/A 01/01/2015   Procedure: COLONOSCOPY WITH PROPOFOL;  Surgeon: Josefine Class, MD;  Location: [facility] ENDOSCOPY;  Service: Endoscopy;  Laterality: N/A;  . EYE  SURGERY     eyelid  . PORTACATH PLACEMENT Right 06/10/2017   Procedure: INSERTION PORT-A-CATH;  Surgeon: Herbert Pun, MD;  Location: [facility] ORS;  Service: General;  Laterality: Right;  . STENT PLACEMENT ILIAC ([place] HX)    . TUBAL LIGATION      SOCIAL HISTORY:  Social History   Tobacco Use  . Smoking status: Former Smoker    Packs/day: 2.00    Years: 10.00    Pack years: 20.00    Types: Cigarettes    Last attempt to quit: 04/21/1972    Years since quitting: 45.2  . Smokeless tobacco: Never Used  Substance Use Topics  . Alcohol use: No    Frequency: Never    FAMILY HISTORY:  Family History  Problem Relation Age of Onset  . Cancer Mother   . Cancer Maternal Aunt   . Breast cancer Neg Hx     DRUG ALLERGIES:  Allergies  Allergen Reactions  . Fiorinal [Butalbital-Aspirin-Caffeine] Nausea And Vomiting and Other (See Comments)    SEVERE HEADACHE  . Other     STERI-STRIPS-RASH  . Sulfa Antibiotics Other (See Comments)    Burning esophagus and stomach  . Tramadol Nausea And Vomiting    REVIEW OF SYSTEMS:   CONSTITUTIONAL: Positive for fever, fatigue or weakness.  EYES: No blurred or double vision.  EARS, NOSE, AND THROAT: No tinnitus or ear pain.  RESPIRATORY: Have cough, shortness of breath,no wheezing or hemoptysis.  CARDIOVASCULAR: No chest pain, orthopnea, edema.  GASTROINTESTINAL: No nausea, vomiting, diarrhea or abdominal pain.  GENITOURINARY: No dysuria, hematuria.  ENDOCRINE: No polyuria, nocturia,  HEMATOLOGY: No anemia, easy bruising or bleeding SKIN: No rash or lesion. MUSCULOSKELETAL: No joint pain or arthritis.   NEUROLOGIC: No tingling, numbness, weakness.  PSYCHIATRY: No anxiety or depression.   MEDICATIONS AT HOME:  Prior to Admission medications   Medication Sig Start Date End Date Taking? Authorizing Provider  acetaminophen (TYLENOL) 500 MG tablet Take 1,000 mg by mouth every 6 (six) hours as needed (for headaches.).   Yes [provider]  aspirin EC 81 MG tablet Take 81 mg by mouth daily.   Yes [provider]  aspirin-acetaminophen-caffeine (EXCEDRIN MIGRAINE) [phone] MG tablet Take 2 tablets by mouth every 6 (six) hours as needed for headache.   Yes [provider]  atorvastatin (LIPITOR) 10 MG tablet Take 10 mg by mouth every evening.    Yes [provider]  Cholecalciferol 2000 UNITS CAPS Take 4,000 Units by mouth daily.    Yes [provider]  colesevelam (WELCHOL) 625 MG tablet Take 1,875 mg by mouth every evening.    Yes [provider]  dexamethasone (DECADRON) 4 MG tablet Take 2 tablets by mouth once a day on the day after chemotherapy and then take 2 tablets two times a day for 2 days. Take with food. 06/16/17  Yes Corcoran, Drue Second, MD  levofloxacin (LEVAQUIN) 500 MG tablet Take 1 tablet (500 mg total) by mouth daily. 06/25/17  Yes Burns, Wandra Feinstein, NP  levothyroxine (SYNTHROID, LEVOTHROID) 50 MCG tablet Take 50 mcg by mouth daily before breakfast.   Yes [provider]  lidocaine-prilocaine (EMLA) cream Apply 1 application topically as needed. 06/11/17  Yes Karen Kitchens, NP  LORazepam (ATIVAN) 0.5 MG tablet Take 1 tablet (0.5 mg total) by mouth every 6 (six) hours as needed (Nausea or vomiting). 06/16/17  Yes Corcoran, Drue Second, MD  Melatonin 5 MG TABS Take 5 mg by mouth at bedtime as needed (for sleep.).   Yes [provider]  metFORMIN (GLUCOPHAGE) 1000 MG tablet Take 1,000 mg by mouth 2 (two) times daily.   Yes [provider]  ondansetron (ZOFRAN) 8 MG tablet Take 1 tablet (8 mg total) by mouth 2 (two) times daily as needed. Start on the third day after chemotherapy. 06/16/17  Yes Corcoran, Drue Second, MD  quinapril (ACCUPRIL) 5 MG tablet Take 5 mg by mouth every morning.    Yes [provider]  sodium bicarbonate 650 MG tablet Take 1,300 mg by mouth at bedtime.   Yes [provider]      PHYSICAL EXAMINATION:    VITAL SIGNS: Blood pressure (!) 125/58, pulse 92, temperature 98.7 F (37.1 C), temperature source Oral, resp. rate 20, height 5\' 8"  (1.727 m), weight 70.5 kg (155 lb 6.8 oz), SpO2 94 %.  GENERAL:  72 y.o.-year-old patient lying in the bed with no acute distress.  EYES: Pupils equal, round, reactive to light and accommodation. No scleral icterus. Extraocular muscles intact.  HEENT: Head atraumatic, normocephalic. Oropharynx and nasopharynx clear.  NECK:  Supple, no jugular venous distention. No thyroid enlargement, no tenderness.  LUNGS: Normal breath sounds bilaterally, no wheezing, bilateral crepitation. No use of accessory muscles of respiration.  CARDIOVASCULAR: S1, S2 normal. No murmurs, rubs, or gallops.  ABDOMEN: Soft, nontender, nondistended. Bowel sounds present. No organomegaly or mass.  EXTREMITIES: No pedal edema, cyanosis, or clubbing.  NEUROLOGIC: Cranial nerves  II through XII are intact. Muscle strength 4- 5/5 in all extremities. Sensation intact. Gait not checked.  PSYCHIATRIC: The patient is alert and oriented x 3.  SKIN: No obvious rash, lesion, or ulcer.   LABORATORY PANEL:   CBC Recent Labs  Lab 06/23/17 0818 06/25/17 1105 06/28/17 0633 06/28/17 1031  WBC 5.8 20.0* 3.3* 1.9*  HGB 14.0 12.8 13.2 11.6*  HCT 40.2 38.1 39.6 34.7*  PLT 203 172 111* 95*  MCV 89.2 89.6 89.6 89.6  MCH 31.0 30.2 29.9 29.9  MCHC 34.7 33.7 33.3 33.4  RDW 13.4 13.2 13.7 13.6  LYMPHSABS 1.5 0.3* 0.2*  --   MONOABS 0.4 0.5 0.0*  --   EOSABS 0.2 0.0 0.0  --   BASOSABS 0.0 0.0 0.0  --    ------------------------------------------------------------------------------------------------------------------  Chemistries  Recent Labs  Lab 06/23/17 0818 06/25/17 1105 06/28/17 0633 06/28/17 1031  NA 138 132* 133*  --   K 3.7 4.3 3.6  --   CL 104 101 99*  --   CO2 27 23 24   --   GLUCOSE 140* 201* 130*  --   BUN 9 15 12   --   CREATININE 0.74 0.80 0.82 0.60  CALCIUM 9.4 9.1 8.7*  --    AST 23  --  22  --   ALT 26  --  19  --   ALKPHOS 41  --  57  --   BILITOT 1.0  --  0.9  --    ------------------------------------------------------------------------------------------------------------------ estimated creatinine clearance is 64.1 mL/min (by C-G formula based on SCr of 0.6 mg/dL). ------------------------------------------------------------------------------------------------------------------ No results for input(s): TSH, T4TOTAL, T3FREE, THYROIDAB in the last 72 hours.  Invalid input(s): FREET3   Coagulation profile Recent Labs  Lab 06/28/17 0633  INR 1.03   ------------------------------------------------------------------------------------------------------------------- No results for input(s): DDIMER in the last 72 hours. -------------------------------------------------------------------------------------------------------------------  Cardiac Enzymes Recent Labs  Lab 06/28/17 0633  TROPONINI <0.03   ------------------------------------------------------------------------------------------------------------------ Invalid input(s): POCBNP  ---------------------------------------------------------------------------------------------------------------  Urinalysis    Component Value Date/Time   COLORURINE STRAW (A) 06/28/2017 0633   APPEARANCEUR CLEAR (A) 06/28/2017 0633   APPEARANCEUR Clear 07/16/2012 1451   LABSPEC 1.010 06/28/2017 0633   LABSPEC 1.008 07/16/2012 1451   PHURINE 7.0 06/28/2017 0633   GLUCOSEU NEGATIVE 06/28/2017 0633   GLUCOSEU Negative 07/16/2012 1451   HGBUR NEGATIVE 06/28/2017 0633   BILIRUBINUR NEGATIVE 06/28/2017 0633   BILIRUBINUR Negative 07/16/2012 1451   KETONESUR NEGATIVE 06/28/2017 0633   PROTEINUR NEGATIVE 06/28/2017 0633   NITRITE NEGATIVE 06/28/2017 0633   LEUKOCYTESUR NEGATIVE 06/28/2017 0633   LEUKOCYTESUR Negative 07/16/2012 1451     RADIOLOGY: Dg Chest 2 View  Result Date: 06/28/2017 CLINICAL DATA:   Acute onset of fever and generalized weakness. Patient on chemotherapy for breast cancer. EXAM: CHEST - 2 VIEW COMPARISON:  Chest radiograph performed 06/10/2017 FINDINGS: The lungs are well-aerated and clear. There is no evidence of focal opacification, pleural effusion or pneumothorax. The heart is normal in size; the mediastinal contour is within normal limits. A left-sided chest port is noted ending about the distal SVC. No acute osseous abnormalities are seen. Clips are noted within the right upper quadrant, reflecting prior cholecystectomy. IMPRESSION: No acute cardiopulmonary process seen. Electronically Signed   By: Garald Balding M.D.   On: 06/28/2017 06:38    EKG: Orders placed or performed during the hospital encounter of 06/28/17  . ED EKG  . ED EKG  . EKG 12-Lead  . EKG 12-Lead    IMPRESSION AND PLAN:  *  sepsis   Influenza A    Will give Tamiflu, IV fluids and symptomatic management of sepsis.   Cultures are sent.   Pneumonia suspected by ER doctor, broad-spectrum antibiotic was given, patient was also taking Levaquin orally for last 2 days, but her pro-calcitonin level is low, so I will stop antibiotics.  * Diabetes   Give metformin and keep on sliding scale coverage.  * hypertension   Continue lisinopril.  * Hyperlipidemia   Continue atorvastatin.  * Hypothyroidism    Cont levothyroxine.  All the records are reviewed and case discussed with ED provider. Management plans discussed with the patient, family and they are in agreement.  CODE STATUS: DNR    Code Status Orders  (From admission, onward)        Start     Ordered   06/28/17 0858  Do not attempt resuscitation (DNR)  Continuous    Question Answer Comment  In the event of cardiac or respiratory ARREST Do not call a "code blue"   In the event of cardiac or respiratory ARREST Do not perform Intubation, CPR, defibrillation or ACLS   In the event of cardiac or respiratory ARREST Use medication by any  route, position, wound care, and other measures to relive pain and suffering. May use oxygen, suction and manual treatment of airway obstruction as needed for comfort.      06/28/17 0858    Code Status History    Date Active Date Inactive Code Status Order ID Comments User Context   This patient has a current code status but no historical code status.    Advance Directive Documentation     Most Recent Value  Type of Advance Directive  Healthcare Power of Attorney  Pre-existing out of facility DNR order (yellow form or pink MOST form)  No data  "MOST" Form in Place?  No data       TOTAL TIME TAKING CARE OF THIS PATIENT: 50 minutes.    [name] M.D on 06/28/2017   Between 7am to 6pm - Pager - [phone]  After 6pm go to [email or web address] - password [email or web address] [facility]  Office  [phone]  CC: Primary care physician; Baxter Hire, MD   Note: This dictation was prepared with Dragon dictation along with smaller phrase technology. Any transcriptional errors that result from this process are unintentional.

## 2017-06-28 NOTE — Consult Note (Signed)
[facility]  Telephone:([phone]) [phone] [phone]) [phone]  ID: Stephanie Sanders OB: 12-10-1945  MR#: [id number]  YCX#:[id number]  Patient Care Team: Baxter Hire, MD as PCP - General (Internal Medicine) Herbert Pun, MD as Consulting Physician (General Surgery)  CHIEF COMPLAINT: Triple negative breast cancer, influenza.  INTERVAL HISTORY: Patient is a 72 year old female who recently initiated chemotherapy last Tuesday by triple negative breast cancer who had increasing temperature despite being placed on antibiotics several days ago for persistent cough.  Upon evaluation in the ER, patient was found to have positive flu antigen.  Currently she feels improved, but not back to her baseline.  She states she has not had fever since this morning.  She continues to have weakness and fatigue.  She has no neurologic complaints.  She denies any chest pain, cough, hemoptysis, or shortness of breath.  She has mild myalgias.  She has no nausea, vomiting, constipation, or diarrhea.  She has no urinary complaints.  Patient otherwise feels well and offers no further specific complaints.  REVIEW OF SYSTEMS:   Review of Systems  Constitutional: Positive for fever and malaise/fatigue. Negative for weight loss.  Respiratory: Positive for cough. Negative for shortness of breath.   Cardiovascular: Negative.  Negative for chest pain and leg swelling.  Gastrointestinal: Negative.  Negative for abdominal pain and blood in stool.  Genitourinary: Negative.  Negative for dysuria.  Musculoskeletal: Positive for myalgias.  Skin: Negative.  Negative for rash.  Neurological: Positive for weakness.  Psychiatric/Behavioral: Negative.  The patient is not nervous/anxious.     As per HPI. Otherwise, a complete review of systems is negative.  PAST MEDICAL HISTORY: Past Medical History:  Diagnosis Date  . Cancer ([place])   . Diabetes mellitus without complication ([place])   . Diabetic nephropathy  ([place])   . Headache    migraines  . Heart murmur    ASYMPTOMATIC  . Hypertension   . Hypothyroidism   . Seborrheic keratosis   . Vitamin D deficiency     PAST SURGICAL HISTORY: Past Surgical History:  Procedure Laterality Date  . APPENDECTOMY    . BREAST BIOPSY Right 05/28/2017   right breast bx 3 areas invasive mamm ca lymph node mets  . BREAST CYST ASPIRATION Bilateral    neg  . CHOLECYSTECTOMY    . COLONOSCOPY WITH PROPOFOL N/A 01/01/2015   Procedure: COLONOSCOPY WITH PROPOFOL;  Surgeon: Josefine Class, MD;  Location: [facility] ENDOSCOPY;  Service: Endoscopy;  Laterality: N/A;  . EYE SURGERY     eyelid  . PORTACATH PLACEMENT Right 06/10/2017   Procedure: INSERTION PORT-A-CATH;  Surgeon: Herbert Pun, MD;  Location: [facility] ORS;  Service: General;  Laterality: Right;  . STENT PLACEMENT ILIAC ([place] HX)    . TUBAL LIGATION      FAMILY HISTORY: Family History  Problem Relation Age of Onset  . Cancer Mother   . Cancer Maternal Aunt   . Breast cancer Neg Hx     ADVANCED DIRECTIVES (Y/N):  @ADVDIR @  HEALTH MAINTENANCE: Social History   Tobacco Use  . Smoking status: Former Smoker    Packs/day: 2.00    Years: 10.00    Pack years: 20.00    Types: Cigarettes    Last attempt to quit: 04/21/1972    Years since quitting: 45.2  . Smokeless tobacco: Never Used  Substance Use Topics  . Alcohol use: No    Frequency: Never  . Drug use: No     Colonoscopy:  PAP:  Bone density:  Lipid panel:  Allergies  Allergen Reactions  . Fiorinal [Butalbital-Aspirin-Caffeine] Nausea And Vomiting and Other (See Comments)    SEVERE HEADACHE  . Other     STERI-STRIPS-RASH  . Sulfa Antibiotics Other (See Comments)    Burning esophagus and stomach  . Tramadol Nausea And Vomiting    Current Facility-Administered Medications  Medication Dose Route Frequency Provider Last Rate Last Dose  . acetaminophen (TYLENOL) tablet 650 mg  650 mg Oral Q6H PRN [name], MD       . aspirin EC tablet 81 mg  81 mg Oral Daily [name], MD   81 mg at 06/28/17 1236  . aspirin-acetaminophen-caffeine (EXCEDRIN MIGRAINE) per tablet 2 tablet  2 tablet Oral Q6H PRN [name], MD      . atorvastatin (LIPITOR) tablet 10 mg  10 mg Oral QPM [name], MD   10 mg at 06/28/17 1715  . bisacodyl (DULCOLAX) suppository 10 mg  10 mg Rectal Daily [name], MD   10 mg at 06/28/17 1236  . cholecalciferol (VITAMIN D) tablet 4,000 Units  4,000 Units Oral Daily [name], MD   4,000 Units at 06/28/17 1236  . colesevelam [facility]) tablet 1,875 mg  1,875 mg Oral QPM [name], MD   1,875 mg at 06/28/17 1714  . docusate sodium (COLACE) capsule 100 mg  100 mg Oral BID PRN [name], MD      . guaiFENesin-dextromethorphan (ROBITUSSIN DM) 100-10 MG/5ML syrup 5 mL  5 mL Oral Q4H PRN [name], MD   5 mL at 06/28/17 1236  . heparin injection 5,000 Units  5,000 Units Subcutaneous Q8H [name], MD   5,000 Units at 06/28/17 1236  . insulin aspart (novoLOG) injection 0-9 Units  0-9 Units Subcutaneous TID WC [name], MD   1 Units at 06/28/17 1714  . [START ON 06/29/2017] levothyroxine (SYNTHROID, LEVOTHROID) tablet 50 mcg  50 mcg Oral QAC breakfast [name], MD      . lisinopril (PRINIVIL,ZESTRIL) tablet 5 mg  5 mg Oral Daily [name], MD   5 mg at 06/28/17 1236  . LORazepam (ATIVAN) tablet 0.5 mg  0.5 mg Oral Q6H PRN [name], MD      . Melatonin TABS 5 mg  5 mg Oral QHS PRN [name], MD      . metFORMIN (GLUCOPHAGE) tablet 1,000 mg  1,000 mg Oral BID WC [name], MD   1,000 mg at 06/28/17 1714  . ondansetron (ZOFRAN) injection 4 mg  4 mg Intravenous Q6H PRN [name], MD      . ondansetron (ZOFRAN) tablet 4 mg  4 mg Oral Q8H PRN [name], MD      . oseltamivir (TAMIFLU) capsule 75  mg  75 mg Oral BID Hinda Kehr, MD   75 mg at 06/28/17 0808    OBJECTIVE: Vitals:   06/28/17 0900 06/28/17 1249  BP: (!) 125/58 122/62  Pulse: 92 83  Resp: 20 20  Temp:  99.5 F (37.5 C)  SpO2: 94% 97%     Body mass index is 23.63 kg/m.    ECOG FS:1 - Symptomatic but completely ambulatory  General: No acute distress.  Lying in bed comfortably. Eyes: Pink conjunctiva, anicteric sclera. HEENT: Normocephalic, moist mucous membranes, clear oropharnyx. Lungs: Clear to auscultation bilaterally. Heart: Regular rate and rhythm. No rubs, murmurs, or gallops. Abdomen: Soft, nontender, nondistended. No organomegaly noted, normoactive bowel sounds. Musculoskeletal: No edema, cyanosis, or clubbing. Neuro: Alert, answering all questions appropriately. Cranial nerves  grossly intact. Skin: No rashes or petechiae noted. Psych: Normal affect.  LAB RESULTS:  Lab Results  Component Value Date   NA 133 (L) 06/28/2017   K 3.6 06/28/2017   CL 99 (L) 06/28/2017   CO2 24 06/28/2017   GLUCOSE 130 (H) 06/28/2017   BUN 12 06/28/2017   CREATININE 0.60 06/28/2017   CALCIUM 8.7 (L) 06/28/2017   PROT 7.1 06/28/2017   ALBUMIN 4.1 06/28/2017   AST 22 06/28/2017   ALT 19 06/28/2017   ALKPHOS 57 06/28/2017   BILITOT 0.9 06/28/2017   GFRNONAA >60 06/28/2017   GFRAA >60 06/28/2017    Lab Results  Component Value Date   WBC 1.9 (L) 06/28/2017   NEUTROABS 3.1 06/28/2017   HGB 11.6 (L) 06/28/2017   HCT 34.7 (L) 06/28/2017   MCV 89.6 06/28/2017   PLT 95 (L) 06/28/2017     STUDIES: Dg Chest 2 View  Result Date: 06/28/2017 CLINICAL DATA:  Acute onset of fever and generalized weakness. Patient on chemotherapy for breast cancer. EXAM: CHEST - 2 VIEW COMPARISON:  Chest radiograph performed 06/10/2017 FINDINGS: The lungs are well-aerated and clear. There is no evidence of focal opacification, pleural effusion or pneumothorax. The heart is normal in size; the mediastinal contour is within normal  limits. A left-sided chest port is noted ending about the distal SVC. No acute osseous abnormalities are seen. Clips are noted within the right upper quadrant, reflecting prior cholecystectomy. IMPRESSION: No acute cardiopulmonary process seen. Electronically Signed   By: Garald Balding M.D.   On: 06/28/2017 06:38   Dg Chest Port 1 View  Result Date: 06/10/2017 CLINICAL DATA:  Porta catheter placement EXAM: PORTABLE CHEST 1 VIEW COMPARISON:  None. FINDINGS: Left IJ porta catheter without kink. Tip is at the upper cavoatrial junction. There is no pneumothorax or mediastinal widening. Heart size is normal. The lungs are clear. IMPRESSION: Left porta catheter with tip at the upper cavoatrial junction. No evidence of cardiopulmonary disease. Electronically Signed   By: Monte Fantasia M.D.   On: 06/10/2017 11:50   Dg C-arm 1-60 Min-no Report  Result Date: 06/10/2017 Fluoroscopy was utilized by the requesting physician.  No radiographic interpretation.    ASSESSMENT: Triple negative breast cancer, influenza.  PLAN:    1.  Triple negative breast cancer: Patient received her first cycle of chemotherapy using Adriamycin and Cytoxan along with Neulasta support on Tuesday, June 24, 2017.  Suspect her white blood cell count will continue to trend down and reached her nadir at approximately 7-10 days after treatment.  Continue to monitor closely and keep on neutropenic precautions.  She has been instructed to keep her previously scheduled follow-up with Dr. Mike Gip in the next 1-2 weeks. 2.  Influenza: Continue Tamiflu.  IV fluids as needed. 3.  Neutropenia: Secondary to chemotherapy.  Neulasta as above. 4.  Thrombocytopenia: Secondary to chemotherapy, monitor.  Appreciate consult, will follow.   Lloyd Huger, MD   06/28/2017 5:16 PM

## 2017-06-28 NOTE — Progress Notes (Signed)
Family Meeting Note  Advance Directive:yes  Today a meeting took place with the Patient and spouse.  The following clinical team members were present during this meeting:MD  The following were discussed:Patient's diagnosis: breast cancer, influenza , Patient's progosis: Unable to determine and Goals for treatment: DNR  Additional follow-up to be provided: Oncology  Time spent during discussion:20 minutes  [name], MD

## 2017-06-28 NOTE — ED Notes (Signed)
Pt to room att from xray

## 2017-06-28 NOTE — ED Triage Notes (Signed)
Pt arrives POV with c/o fever x 2 days and generalized weakness x 4 days. Pt is a chemo pt for breast cancer. Pt is exhibiting flu like symptoms at this time.

## 2017-06-29 DIAGNOSIS — D696 Thrombocytopenia, unspecified: Secondary | ICD-10-CM

## 2017-06-29 DIAGNOSIS — T451X5A Adverse effect of antineoplastic and immunosuppressive drugs, initial encounter: Secondary | ICD-10-CM

## 2017-06-29 DIAGNOSIS — D701 Agranulocytosis secondary to cancer chemotherapy: Secondary | ICD-10-CM

## 2017-06-29 LAB — CBC
HEMATOCRIT: 32.8 % — AB (ref 35.0–47.0)
HEMOGLOBIN: 11.2 g/dL — AB (ref 12.0–16.0)
MCH: 30.4 pg (ref 26.0–34.0)
MCHC: 34.1 g/dL (ref 32.0–36.0)
MCV: 88.9 fL (ref 80.0–100.0)
Platelets: 78 10*3/uL — ABNORMAL LOW (ref 150–440)
RBC: 3.69 MIL/uL — AB (ref 3.80–5.20)
RDW: 13.5 % (ref 11.5–14.5)
WBC: 0.4 10*3/uL — CL (ref 3.6–11.0)

## 2017-06-29 LAB — BASIC METABOLIC PANEL
ANION GAP: 9 (ref 5–15)
BUN: 9 mg/dL (ref 6–20)
CHLORIDE: 101 mmol/L (ref 101–111)
CO2: 22 mmol/L (ref 22–32)
Calcium: 8 mg/dL — ABNORMAL LOW (ref 8.9–10.3)
Creatinine, Ser: 0.68 mg/dL (ref 0.44–1.00)
GFR calc non Af Amer: >60 mL/min (ref >60)
Glucose, Bld: 114 mg/dL — ABNORMAL HIGH (ref 65–99)
Potassium: 3.2 mmol/L — ABNORMAL LOW (ref 3.5–5.1)
Sodium: 132 mmol/L — ABNORMAL LOW (ref 135–145)

## 2017-06-29 LAB — GLUCOSE, CAPILLARY
GLUCOSE-CAPILLARY: 97 mg/dL (ref 65–99)
Glucose-Capillary: 98 mg/dL (ref 65–99)
Glucose-Capillary: 148 mg/dL — ABNORMAL HIGH (ref 65–99)
Glucose-Capillary: 98 mg/dL (ref 65–99)

## 2017-06-29 LAB — URINE CULTURE: Culture: NO GROWTH

## 2017-06-29 MED ORDER — POTASSIUM CHLORIDE CRYS ER 20 MEQ PO TBCR
20.0000 meq | EXTENDED_RELEASE_TABLET | Freq: Two times a day (BID) | ORAL | Status: DC
  Administered 2017-06-29 – 2017-07-01 (×4): 20 meq via ORAL
  Filled 2017-06-29 (×5): qty 1

## 2017-06-29 NOTE — Progress Notes (Signed)
CRITICAL VALUE STICKER  CRITICAL VALUE: WBC 0.4  RECEIVER (on-site recipient of call): Dorna Bloom RN  [place] NOTIFIED: 06/29/17  0525  MESSENGER (representative from lab):  MD NOTIFIED: PRIME Doc  TIME OF NOTIFICATION: 06/29/17  0527  RESPONSE: pending

## 2017-06-29 NOTE — Plan of Care (Signed)
  Progressing Education: Knowledge of General Education information will improve 06/29/2017 0315 - Progressing by Denice Bors, RN Health Behavior/Discharge Planning: Ability to manage health-related needs will improve 06/29/2017 0315 - Progressing by Denice Bors, RN Clinical Measurements: Ability to maintain clinical measurements within normal limits will improve 06/29/2017 0315 - Progressing by Denice Bors, RN Will remain free from infection 06/29/2017 0315 - Progressing by Denice Bors, RN Diagnostic test results will improve 06/29/2017 0315 - Progressing by Denice Bors, RN Cardiovascular complication will be avoided 06/29/2017 0315 - Progressing by Denice Bors, RN Activity: Risk for activity intolerance will decrease 06/29/2017 0315 - Progressing by Denice Bors, RN Nutrition: Adequate nutrition will be maintained 06/29/2017 0315 - Progressing by Denice Bors, RN Coping: Level of anxiety will decrease 06/29/2017 0315 - Progressing by Denice Bors, RN Elimination: Will not experience complications related to bowel motility 06/29/2017 0315 - Progressing by Denice Bors, RN Will not experience complications related to urinary retention 06/29/2017 0315 - Progressing by Denice Bors, RN Pain Managment: General experience of comfort will improve 06/29/2017 0315 - Progressing by Denice Bors, RN Safety: Ability to remain free from injury will improve 06/29/2017 0315 - Progressing by Denice Bors, RN Skin Integrity: Risk for impaired skin integrity will decrease 06/29/2017 0315 - Progressing by Denice Bors, RN Clinical Measurements: Signs and symptoms of infection will decrease 06/29/2017 0315 - Progressing by Denice Bors, RN Respiratory: Ability to maintain adequate ventilation will improve 06/29/2017 0315 - Progressing by Denice Bors, RN

## 2017-06-29 NOTE — Progress Notes (Signed)
[facility] Hematology/Oncology Progress Note  Date of admission: 06/28/2017  Hospital day:  06/29/2017  Chief Complaint: Stephanie Sanders is a 72 y.o. 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  Subjective:  Feeling better today.  Fever curve improved.  Appetite returning.  Social History: The patient is accompanied by her husband today.  Allergies:  Allergies  Allergen Reactions  . Fiorinal [Butalbital-Aspirin-Caffeine] Nausea And Vomiting and Other (See Comments)    SEVERE HEADACHE  . Other     STERI-STRIPS-RASH  . Sulfa Antibiotics Other (See Comments)    Burning esophagus and stomach  . Tramadol Nausea And Vomiting    Scheduled Medications: . aspirin EC  81 mg Oral Daily  . atorvastatin  10 mg Oral QPM  . bisacodyl  10 mg Rectal Daily  . cholecalciferol  4,000 Units Oral Daily  . colesevelam  1,875 mg Oral QPM  . heparin  5,000 Units Subcutaneous Q8H  . insulin aspart  0-9 Units Subcutaneous TID WC  . levothyroxine  50 mcg Oral QAC breakfast  . lisinopril  5 mg Oral Daily  . metFORMIN  1,000 mg Oral BID WC  . oseltamivir  75 mg Oral BID  . potassium chloride  20 mEq Oral BID    Review of Systems: GENERAL:  Feels better.  Fatigue, improving.  Low grade fever. PERFORMANCE STATUS (ECOG):  1 HEENT:  No visual changes, runny nose, sore throat, mouth sores or tenderness. Lungs: No shortness of breath.  Cough.  No hemoptysis. Cardiac:  No chest pain, palpitations, orthopnea, or PND. GI:  Appetite improving.  No nausea, vomiting, diarrhea, constipation, melena or hematochezia. GU:  No urgency, frequency, dysuria, or hematuria. Musculoskeletal:  No back pain.  No joint pain.  No muscle tenderness. Extremities:  No pain or swelling. Skin:  No rashes or skin changes. Neuro:  No headache, numbness or weakness, balance or coordination issues. Endocrine:  No diabetes, thyroid issues, hot flashes or night  sweats. Psych:  No mood changes, depression or anxiety. Pain:  No focal pain. Review of systems:  All other systems reviewed and found to be negative.  Physical Exam: Blood pressure 140/70, pulse 85, temperature 98.9 F (37.2 C), temperature source Oral, resp. rate 15, height '5\' 8"'$  (1.727 m), weight 155 lb 6.8 oz (70.5 kg), SpO2 97 %.  GENERAL:  Well developed, well nourished, woman sitting comfortably on the medical oncology unit in no acute distress. MENTAL STATUS:  Alert and oriented to person, place and time. HEAD:  Shoulder length gray hair.  Normocephalic, atraumatic, face symmetric, no Cushingoid features. EYES:  Blue eyes.  Pupils equal round and reactive to light and accomodation.  No conjunctivitis or scleral icterus. ENT:  Oropharynx clear without lesion.  Tongue normal. Mucous membranes moist.  RESPIRATORY:  Scattered soft wheezes.  Crackles at the bases bilaterally.  No rhonchi.  CARDIOVASCULAR:  Regular rate and rhythm without murmur, rub or gallop. ABDOMEN:  Soft, non-tender, with active bowel sounds, and no hepatosplenomegaly.  No masses. SKIN:  No rashes, ulcers or lesions. EXTREMITIES: No edema, no skin discoloration or tenderness.  No palpable cords. NEUROLOGICAL: Unremarkable. PSYCH:  Appropriate.   Results for orders placed or performed during the hospital encounter of 06/28/17 (from the past 48 hour(s))  Lactic acid, plasma     Status: None   Collection Time: 06/28/17  6:09 AM  Result Value Ref Range   Lactic Acid, Venous 1.3 0.5 - 1.9 mmol/L  Comment: Performed at [facility], [address] [place], [place] [address]  Culture, blood (Routine x 2)     Status: None (Preliminary result)   Collection Time: 06/28/17  6:09 AM  Result Value Ref Range   Specimen Description BLOOD RIGHT ANTECUBITAL    Special Requests      BOTTLES DRAWN AEROBIC AND ANAEROBIC Blood Culture results may not be optimal due to an excessive volume of blood received in culture  bottles   Culture      NO GROWTH 1 DAY Performed at [facility], [address] [place], [place] [address]    Report Status PENDING   Comprehensive metabolic panel     Status: Abnormal   Collection Time: 06/28/17  6:33 AM  Result Value Ref Range   Sodium 133 (L) 135 - 145 mmol/L   Potassium 3.6 3.5 - 5.1 mmol/L   Chloride 99 (L) 101 - 111 mmol/L   CO2 24 22 - 32 mmol/L   Glucose, Bld 130 (H) 65 - 99 mg/dL   BUN 12 6 - 20 mg/dL   Creatinine, Ser 0.82 0.44 - 1.00 mg/dL   Calcium 8.7 (L) 8.9 - 10.3 mg/dL   Total Protein 7.1 6.5 - 8.1 g/dL   Albumin 4.1 3.5 - 5.0 g/dL   AST 22 15 - 41 U/L   ALT 19 14 - 54 U/L   Alkaline Phosphatase 57 38 - 126 U/L   Total Bilirubin 0.9 0.3 - 1.2 mg/dL   GFR calc non Af Amer >60 >60 mL/min   GFR calc Af Amer >60 >60 mL/min    Comment: (NOTE) The eGFR has been calculated using the CKD EPI equation. This calculation has not been validated in all clinical situations. eGFR's persistently <60 mL/min signify possible Chronic Kidney Disease.    Anion gap 10 5 - 15    Comment: Performed at [facility], [address] [place], [place] [address]  CBC with Differential     Status: Abnormal   Collection Time: 06/28/17  6:33 AM  Result Value Ref Range   WBC 3.3 (L) 3.6 - 11.0 K/uL   RBC 4.42 3.80 - 5.20 MIL/uL   Hemoglobin 13.2 12.0 - 16.0 g/dL   HCT 39.6 35.0 - 47.0 %   MCV 89.6 80.0 - 100.0 fL   MCH 29.9 26.0 - 34.0 pg   MCHC 33.3 32.0 - 36.0 g/dL   RDW 13.7 11.5 - 14.5 %   Platelets 111 (L) 150 - 440 K/uL    Comment: PLATELET CLUMPS NOTED ON SMEAR, COUNT APPEARS ADEQUATE   Neutrophils Relative % 93 %   Lymphocytes Relative 6 %   Monocytes Relative 1 %   Eosinophils Relative 0 %   Basophils Relative 0 %   Neutro Abs 3.1 1.4 - 6.5 K/uL   Lymphs Abs 0.2 (L) 1.0 - 3.6 K/uL   Monocytes Absolute 0.0 (L) 0.2 - 0.9 K/uL   Eosinophils Absolute 0.0 0 - 0.7 K/uL   Basophils Absolute 0.0 0 - 0.1 K/uL   Smear Review MORPHOLOGY  UNREMARKABLE     Comment: Performed at [facility], [address] [place], [place] [address]  Protime-INR     Status: None   Collection Time: 06/28/17  6:33 AM  Result Value Ref Range   Prothrombin Time 13.4 11.4 - 15.2 seconds   INR 1.03     Comment: Performed at [facility], [address], [place], [place] [address]  Urinalysis, Complete w  Microscopic     Status: Abnormal   Collection Time: 06/28/17  6:33 AM  Result Value Ref Range   Color, Urine STRAW (A) YELLOW   APPearance CLEAR (A) CLEAR   Specific Gravity, Urine 1.010 1.005 - 1.030   pH 7.0 5.0 - 8.0   Glucose, UA NEGATIVE NEGATIVE mg/dL   Hgb urine dipstick NEGATIVE NEGATIVE   Bilirubin Urine NEGATIVE NEGATIVE   Ketones, ur NEGATIVE NEGATIVE mg/dL   Protein, ur NEGATIVE NEGATIVE mg/dL   Nitrite NEGATIVE NEGATIVE   Leukocytes, UA NEGATIVE NEGATIVE   RBC / HPF 0-5 0 - 5 RBC/hpf   WBC, UA 0-5 0 - 5 WBC/hpf   Bacteria, UA NONE SEEN NONE SEEN   Squamous Epithelial / LPF NONE SEEN NONE SEEN    Comment: Performed at [facility], [address] [place], [place] [address]  Influenza panel by PCR (type A & B)     Status: Abnormal   Collection Time: 06/28/17  6:33 AM  Result Value Ref Range   Influenza A By PCR POSITIVE (A) NEGATIVE   Influenza B By PCR NEGATIVE NEGATIVE    Comment: (NOTE) The Xpert Xpress Flu assay is intended as an aid in the diagnosis of  influenza and should not be used as a sole basis for treatment.  This  assay is FDA approved for nasopharyngeal swab specimens only. Nasal  washings and aspirates are unacceptable for Xpert Xpress Flu testing. Performed at [facility], [address] [place], [place] [address]   Lipase, blood     Status: None   Collection Time: 06/28/17  6:33 AM  Result Value Ref Range   Lipase 41 11 - 51 U/L    Comment: Performed at [facility], [address] [place], [place] [address]  Brain natriuretic peptide - IF patient  is dyspneic     Status: Abnormal   Collection Time: 06/28/17  6:33 AM  Result Value Ref Range   B Natriuretic Peptide 130.0 (H) 0.0 - 100.0 pg/mL    Comment: Performed at [facility], [address] [place], [place] [address]  Troponin I     Status: None   Collection Time: 06/28/17  6:33 AM  Result Value Ref Range   Troponin I <0.03 <0.03 ng/mL    Comment: Performed at [facility], [place], [place] [address]  Procalcitonin     Status: None   Collection Time: 06/28/17  6:33 AM  Result Value Ref Range   Procalcitonin <0.10 ng/mL    Comment:        Interpretation: PCT (Procalcitonin) <= 0.5 ng/mL: Systemic infection (sepsis) is not likely. Local bacterial infection is possible. (NOTE)       Sepsis PCT Algorithm           Lower Respiratory Tract                                      Infection PCT Algorithm    ----------------------------     ----------------------------         PCT < 0.25 ng/mL                PCT < 0.10 ng/mL         Strongly encourage             Strongly discourage   discontinuation of antibiotics    initiation of antibiotics    ----------------------------     -----------------------------  PCT 0.25 - 0.50 ng/mL            PCT 0.10 - 0.25 ng/mL               OR       >80% decrease in PCT            Discourage initiation of                                            antibiotics      Encourage discontinuation           of antibiotics    ----------------------------     -----------------------------         PCT >= 0.50 ng/mL              PCT 0.26 - 0.50 ng/mL               AND        <80% decrease in PCT             Encourage initiation of                                             antibiotics       Encourage continuation           of antibiotics    ----------------------------     -----------------------------        PCT >= 0.50 ng/mL                  PCT > 0.50 ng/mL               AND         increase in PCT                   Strongly encourage                                      initiation of antibiotics    Strongly encourage escalation           of antibiotics                                     -----------------------------                                           PCT <= 0.25 ng/mL                                                 OR                                        > 80% decrease in PCT  Discontinue / Do not initiate                                             antibiotics Performed at [facility], [address], [place], [place] [address]   Urine culture     Status: None   Collection Time: 06/28/17  6:33 AM  Result Value Ref Range   Specimen Description      URINE, RANDOM Performed at [facility], [address] [place], [place] [address]    Special Requests      NONE Performed at [facility], [address], [place], [place] [address]    Culture      NO GROWTH Performed at [facility], [address], [place], [place] [address]    Report Status 06/29/2017 FINAL   CBC     Status: Abnormal   Collection Time: 06/28/17 10:31 AM  Result Value Ref Range   WBC 1.9 (L) 3.6 - 11.0 K/uL   RBC 3.88 3.80 - 5.20 MIL/uL   Hemoglobin 11.6 (L) 12.0 - 16.0 g/dL   HCT 34.7 (L) 35.0 - 47.0 %   MCV 89.6 80.0 - 100.0 fL   MCH 29.9 26.0 - 34.0 pg   MCHC 33.4 32.0 - 36.0 g/dL   RDW 13.6 11.5 - 14.5 %   Platelets 95 (L) 150 - 440 K/uL    Comment: Performed at [facility], [address] [place], [place] [address]  Creatinine, serum     Status: None   Collection Time: 06/28/17 10:31 AM  Result Value Ref Range   Creatinine, Ser 0.60 0.44 - 1.00 mg/dL   GFR calc non Af Amer >60 >60 mL/min   GFR calc Af Amer >60 >60 mL/min    Comment: (NOTE) The eGFR has been calculated using the CKD EPI equation. This calculation has not been validated in all clinical situations. eGFR's persistently <60 mL/min signify  possible Chronic Kidney Disease. Performed at [facility], [address] [place], [place] [address]   Glucose, capillary     Status: Abnormal   Collection Time: 06/28/17  4:40 PM  Result Value Ref Range   Glucose-Capillary 141 (H) 65 - 99 mg/dL  Glucose, capillary     Status: Abnormal   Collection Time: 06/28/17  9:42 PM  Result Value Ref Range   Glucose-Capillary 115 (H) 65 - 99 mg/dL  Basic metabolic panel     Status: Abnormal   Collection Time: 06/29/17  4:44 AM  Result Value Ref Range   Sodium 132 (L) 135 - 145 mmol/L   Potassium 3.2 (L) 3.5 - 5.1 mmol/L   Chloride 101 101 - 111 mmol/L   CO2 22 22 - 32 mmol/L   Glucose, Bld 114 (H) 65 - 99 mg/dL   BUN 9 6 - 20 mg/dL   Creatinine, Ser 0.68 0.44 - 1.00 mg/dL   Calcium 8.0 (L) 8.9 - 10.3 mg/dL   GFR calc non Af Amer >60 >60 mL/min   GFR calc Af Amer >60 >60 mL/min    Comment: (NOTE) The eGFR has been calculated using the CKD EPI equation. This calculation has not been validated in all clinical situations. eGFR's persistently <60 mL/min signify possible Chronic Kidney Disease.    Anion gap 9 5 - 15    Comment: Performed at [facility], [address] [place], [place] [address]  CBC     Status: Abnormal   Collection Time: 06/29/17  4:44 AM  Result Value Ref Range   WBC 0.4 (LL) 3.6 - 11.0 K/uL    Comment: CRITICAL RESULT CALLED TO, READ BACK BY AND VERIFIED WITH:  Stephanie Sanders AT 0522 06/29/17 SDR    RBC 3.69 (L) 3.80 - 5.20 MIL/uL   Hemoglobin 11.2 (L) 12.0 - 16.0 g/dL   HCT 32.8 (L) 35.0 - 47.0 %   MCV 88.9 80.0 - 100.0 fL   MCH 30.4 26.0 - 34.0 pg   MCHC 34.1 32.0 - 36.0 g/dL   RDW 13.5 11.5 - 14.5 %   Platelets 78 (L) 150 - 440 K/uL    Comment: Performed at [facility], [address] [place], [place] [address]  Glucose, capillary     Status: None   Collection Time: 06/29/17  7:36 AM  Result Value Ref Range   Glucose-Capillary 98 65 - 99 mg/dL  Glucose, capillary      Status: Abnormal   Collection Time: 06/29/17 11:32 AM  Result Value Ref Range   Glucose-Capillary 148 (H) 65 - 99 mg/dL  Glucose, capillary     Status: None   Collection Time: 06/29/17  5:08 PM  Result Value Ref Range   Glucose-Capillary 98 65 - 99 mg/dL  Glucose, capillary     Status: None   Collection Time: 06/29/17  9:02 PM  Result Value Ref Range   Glucose-Capillary 97 65 - 99 mg/dL   Dg Chest 2 View  Result Date: 06/28/2017 CLINICAL DATA:  Acute onset of fever and generalized weakness. Patient on chemotherapy for breast cancer. EXAM: CHEST - 2 VIEW COMPARISON:  Chest radiograph performed 06/10/2017 FINDINGS: The lungs are well-aerated and clear. There is no evidence of focal opacification, pleural effusion or pneumothorax. The heart is normal in size; the mediastinal contour is within normal limits. A left-sided chest port is noted ending about the distal SVC. No acute osseous abnormalities are seen. Clips are noted within the right upper quadrant, reflecting prior cholecystectomy. IMPRESSION: No acute cardiopulmonary process seen. Electronically Signed   By: Garald Balding M.D.   On: 06/28/2017 06:38    Assessment:  JAKIRA MCFADDEN is a 72 y.o. female with multi-focal triple negative clinical stage [id number] right breast cancer who was admitted on day 6 of cycle #1 AC chemotherapy with fever and influenza A.  Symptomatically, she is feeling better.  Exam reveals coarse crackles at the bases and scattered wheezes.  Plan: 1.  Oncology:  Triple negative breast cancer.  Day 6 s/p cycle #1 AC with Neulasta Stephanie Sanders) support.  Anticipate count recovery later this week.  2.  Hematology:  Patient neutropenic despite Neulasta support likely secondary to infection.  Maintain neutropenic precautions.  Neutropenic diet.  No rectal suppositories.  Thrombocytopenia secondary to chemotherapy and infection.  On heparin for DVT prophylaxis.  Check counts daily.  3.  Infectious disease:  Influenza A on  Tamiflu.  If patient develops a fever (100.4 or higher), would require fever work-up with blood cultures and initiation of broad spectrum antibiotics as patient is neutropenic.  4.  Code status:  Patient stated that she was confused with initial conversation about code status.  She wishes to return to FULL CODE status unless her condition is irreversible.   Lequita Asal, MD  06/29/2017, 9:26 PM

## 2017-06-29 NOTE — Progress Notes (Signed)
[place] [facility] [place] NAME: Stephanie Sanders    MR#:  [id number]  DATE OF BIRTH:  12-25-1945  SUBJECTIVE:  CHIEF COMPLAINT:   Chief Complaint  Patient presents with  . Code Sepsis   Admitted with Flu, feels much better today. Her WBC count dropped.  REVIEW OF SYSTEMS:  CONSTITUTIONAL: No fever, fatigue or weakness.  EYES: No blurred or double vision.  EARS, NOSE, AND THROAT: No tinnitus or ear pain.  RESPIRATORY: No cough, shortness of breath, wheezing or hemoptysis.  CARDIOVASCULAR: No chest pain, orthopnea, edema.  GASTROINTESTINAL: No nausea, vomiting, diarrhea or abdominal pain.  GENITOURINARY: No dysuria, hematuria.  ENDOCRINE: No polyuria, nocturia,  HEMATOLOGY: No anemia, easy bruising or bleeding SKIN: No rash or lesion. MUSCULOSKELETAL: No joint pain or arthritis.   NEUROLOGIC: No tingling, numbness, weakness.  PSYCHIATRY: No anxiety or depression.   ROS  DRUG ALLERGIES:   Allergies  Allergen Reactions  . Fiorinal [Butalbital-Aspirin-Caffeine] Nausea And Vomiting and Other (See Comments)    SEVERE HEADACHE  . Other     STERI-STRIPS-RASH  . Sulfa Antibiotics Other (See Comments)    Burning esophagus and stomach  . Tramadol Nausea And Vomiting    VITALS:  Blood pressure (!) 129/58, pulse 86, temperature 98.9 F (37.2 C), temperature source Oral, resp. rate 20, height 5\' 8"  (1.727 m), weight 70.5 kg (155 lb 6.8 oz), SpO2 97 %.  PHYSICAL EXAMINATION:  GENERAL:  72 y.o.-year-old patient lying in the bed with no acute distress.  EYES: Pupils equal, round, reactive to light and accommodation. No scleral icterus. Extraocular muscles intact.  HEENT: Head atraumatic, normocephalic. Oropharynx and nasopharynx clear.  NECK:  Supple, no jugular venous distention. No thyroid enlargement, no tenderness.  LUNGS: Normal breath sounds bilaterally, no wheezing, some crepitation. No use of accessory muscles of respiration.   CARDIOVASCULAR: S1, S2 normal. No murmurs, rubs, or gallops.  ABDOMEN: Soft, nontender, nondistended. Bowel sounds present. No organomegaly or mass.  EXTREMITIES: No pedal edema, cyanosis, or clubbing.  NEUROLOGIC: Cranial nerves II through XII are intact. Muscle strength 4-5/5 in all extremities. Sensation intact. Gait not checked.  PSYCHIATRIC: The patient is alert and oriented x 3.  SKIN: No obvious rash, lesion, or ulcer.   Physical Exam LABORATORY PANEL:   CBC Recent Labs  Lab 06/29/17 0444  WBC 0.4*  HGB 11.2*  HCT 32.8*  PLT 78*   ------------------------------------------------------------------------------------------------------------------  Chemistries  Recent Labs  Lab 06/28/17 0633  06/29/17 0444  NA 133*  --  132*  K 3.6  --  3.2*  CL 99*  --  101  CO2 24  --  22  GLUCOSE 130*  --  114*  BUN 12  --  9  CREATININE 0.82   < > 0.68  CALCIUM 8.7*  --  8.0*  AST 22  --   --   ALT 19  --   --   ALKPHOS 57  --   --   BILITOT 0.9  --   --    < > = values in this interval not displayed.   ------------------------------------------------------------------------------------------------------------------  Cardiac Enzymes Recent Labs  Lab 06/28/17 0633  TROPONINI <0.03   ------------------------------------------------------------------------------------------------------------------  RADIOLOGY:  Dg Chest 2 View  Result Date: 06/28/2017 CLINICAL DATA:  Acute onset of fever and generalized weakness. Patient on chemotherapy for breast cancer. EXAM: CHEST - 2 VIEW COMPARISON:  Chest radiograph performed 06/10/2017 FINDINGS: The lungs are well-aerated and clear. There is no evidence of  focal opacification, pleural effusion or pneumothorax. The heart is normal in size; the mediastinal contour is within normal limits. A left-sided chest port is noted ending about the distal SVC. No acute osseous abnormalities are seen. Clips are noted within the right upper quadrant,  reflecting prior cholecystectomy. IMPRESSION: No acute cardiopulmonary process seen. Electronically Signed   By: Garald Balding M.D.   On: 06/28/2017 06:38    ASSESSMENT AND PLAN:   Principal Problem:   Sepsis ([place]) Active Problems:   Influenza A  * sepsis   Influenza A    give Tamiflu, IV fluids and symptomatic management of sepsis.   Cultures are sent.   Pneumonia suspected by ER doctor, broad-spectrum antibiotic was given, patient was also taking Levaquin orally for last 2 days, but her pro-calcitonin level is low, so I will stop antibiotics.  * Neutropenia   She had received Neulasta after chemo.   Monitor as per Oncology.  * Diabetes   Give metformin and keep on sliding scale coverage.  * hypertension   Continue lisinopril.  * Hyperlipidemia   Continue atorvastatin.  * Hypothyroidism    Cont levothyroxine.   All the records are reviewed and case discussed with Care Management/Social Workerr. Management plans discussed with the patient, family and they are in agreement.  CODE STATUS: DNR  TOTAL TIME TAKING CARE OF THIS PATIENT: 35 minutes.    POSSIBLE D/C IN 1-2 DAYS, DEPENDING ON CLINICAL CONDITION.   [name] M.D on 06/29/2017   Between 7am to 6pm - Pager - [phone]  After 6pm go to [email or web address] - password [email or web address] [facility]  Office  [phone]  CC: Primary care physician; Baxter Hire, MD  Note: This dictation was prepared with Dragon dictation along with smaller phrase technology. Any transcriptional errors that result from this process are unintentional.

## 2017-06-29 NOTE — Progress Notes (Signed)
PT Cancellation Note  Patient Details Name: Stephanie Sanders MRN: [id number] DOB: 24-Jul-1945   Cancelled Treatment:    Reason Eval/Treat Not Completed: Medical issues which prohibited therapy. Pt's WBC count at critically low value (0.4), MD informed per RN note and awaiting a response. Pt with hx of breast CA and currently has the flu. Will hold PT until pt is cleared by MD.  Geoffry Paradise, PT,DPT 06/29/17 8:34 AM     Azalie Harbeck L 06/29/2017, 8:33 AM

## 2017-06-30 ENCOUNTER — Inpatient Hospital Stay: Payer: Medicare Other

## 2017-06-30 ENCOUNTER — Inpatient Hospital Stay: Payer: Medicare Other | Admitting: Hematology and Oncology

## 2017-06-30 LAB — BASIC METABOLIC PANEL
Anion gap: 9 (ref 5–15)
BUN: 12 mg/dL (ref 6–20)
CHLORIDE: 105 mmol/L (ref 101–111)
CO2: 23 mmol/L (ref 22–32)
Calcium: 8.5 mg/dL — ABNORMAL LOW (ref 8.9–10.3)
Creatinine, Ser: 0.69 mg/dL (ref 0.44–1.00)
GFR calc Af Amer: >60 mL/min (ref >60)
GFR calc non Af Amer: >60 mL/min (ref >60)
GLUCOSE: 105 mg/dL — AB (ref 65–99)
POTASSIUM: 3.8 mmol/L (ref 3.5–5.1)
Sodium: 137 mmol/L (ref 135–145)

## 2017-06-30 LAB — CBC WITH DIFFERENTIAL/PLATELET
Band Neutrophils: 2 %
Basophils Absolute: 0 10*3/uL (ref 0–0.1)
Basophils Relative: 8 %
Blasts: 0 %
Eosinophils Absolute: 0 10*3/uL (ref 0–0.7)
Eosinophils Relative: 4 %
HCT: 35.3 % (ref 35.0–47.0)
Hemoglobin: 11.8 g/dL — ABNORMAL LOW (ref 12.0–16.0)
Lymphocytes Relative: 80 %
Lymphs Abs: 0.4 10*3/uL — ABNORMAL LOW (ref 1.0–3.6)
MCH: 29.9 pg (ref 26.0–34.0)
MCHC: 33.4 g/dL (ref 32.0–36.0)
MCV: 89.6 fL (ref 80.0–100.0)
Metamyelocytes Relative: 0 %
Monocytes Absolute: 0 10*3/uL — ABNORMAL LOW (ref 0.2–0.9)
Monocytes Relative: 0 %
Myelocytes: 0 %
Neutro Abs: 0 10*3/uL — ABNORMAL LOW (ref 1.4–6.5)
Neutrophils Relative %: 6 %
Other: 0 %
Platelets: 85 10*3/uL — ABNORMAL LOW (ref 150–440)
Promyelocytes Absolute: 0 %
RBC: 3.94 MIL/uL (ref 3.80–5.20)
RDW: 13.7 % (ref 11.5–14.5)
WBC: 0.4 10*3/uL — CL (ref 3.6–11.0)
nRBC: 0 /100 WBC

## 2017-06-30 LAB — GLUCOSE, CAPILLARY
GLUCOSE-CAPILLARY: 112 mg/dL — AB (ref 65–99)
GLUCOSE-CAPILLARY: 120 mg/dL — AB (ref 65–99)
Glucose-Capillary: 99 mg/dL (ref 65–99)
Glucose-Capillary: 157 mg/dL — ABNORMAL HIGH (ref 65–99)

## 2017-06-30 LAB — CULTURE, BLOOD (ROUTINE X 2)
CULTURE: NO GROWTH
Culture: NO GROWTH
SPECIAL REQUESTS: ADEQUATE
SPECIAL REQUESTS: ADEQUATE

## 2017-06-30 NOTE — Progress Notes (Signed)
[facility] Hematology/Oncology Progress Note  Date of admission: 06/28/2017  Hospital day:  06/30/2017  Chief Complaint: TEMPIE GIBEAULT is a 72 y.o. 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  Subjective:  Feels a little better today.  Fever resolved.  Breathing better.  Still has a cough.  Social History: The patient is accompanied by her husband today.  Allergies:  Allergies  Allergen Reactions  . Fiorinal [Butalbital-Aspirin-Caffeine] Nausea And Vomiting and Other (See Comments)    SEVERE HEADACHE  . Other     STERI-STRIPS-RASH  . Sulfa Antibiotics Other (See Comments)    Burning esophagus and stomach  . Tramadol Nausea And Vomiting    Scheduled Medications: . aspirin EC  81 mg Oral Daily  . atorvastatin  10 mg Oral QPM  . bisacodyl  10 mg Rectal Daily  . cholecalciferol  4,000 Units Oral Daily  . colesevelam  1,875 mg Oral QPM  . heparin  5,000 Units Subcutaneous Q8H  . insulin aspart  0-9 Units Subcutaneous TID WC  . levothyroxine  50 mcg Oral QAC breakfast  . lisinopril  5 mg Oral Daily  . metFORMIN  1,000 mg Oral BID WC  . oseltamivir  75 mg Oral BID  . potassium chloride  20 mEq Oral BID    Review of Systems: GENERAL:  Feels better.  Fatigue, improving.  No fever. PERFORMANCE STATUS (ECOG):  1 HEENT:  No visual changes, runny nose, sore throat, mouth sores or tenderness. Lungs: No shortness of breath.  Cough.  No hemoptysis. Cardiac:  No chest pain, palpitations, orthopnea, or PND. GI:  Appetite improving.  No nausea, vomiting, diarrhea, constipation, melena or hematochezia. GU:  No urgency, frequency, dysuria, or hematuria. Musculoskeletal:  No back pain.  No joint pain.  No muscle tenderness. Extremities:  No pain or swelling. Skin:  No rashes or skin changes. Neuro:  No headache, numbness or weakness, balance or coordination issues. Endocrine:  No diabetes, thyroid issues, hot flashes or  night sweats. Psych:  No mood changes, depression or anxiety. Pain:  No focal pain. Review of systems:  All other systems reviewed and found to be negative.  Physical Exam: Blood pressure (!) 142/73, pulse 87, temperature 98.6 F (37 C), temperature source Oral, resp. rate 20, height '5\' 8"'$  (1.727 m), weight 155 lb 6.8 oz (70.5 kg), SpO2 96 %.  GENERAL:  Slightly fatigued appearing woman sitting comfortably on the medical oncology unit in no acute distress. MENTAL STATUS:  Alert and oriented to person, place and time. HEAD:  Shoulder length gray hair.  Normocephalic, atraumatic, face symmetric, no Cushingoid features. EYES:  Blue eyes.  Pupils equal round and reactive to light and accomodation.  No conjunctivitis or scleral icterus. ENT:  Oropharynx clear without lesion.  Tongue normal. Mucous membranes moist.  RESPIRATORY:  Clear to ausculation without rales, wheezes or rhonchi.  Deep breath causes coughing. CARDIOVASCULAR:  Regular rate and rhythm without murmur, rub or gallop. ABDOMEN:  Soft, non-tender, with active bowel sounds, and no hepatosplenomegaly.  No masses. SKIN:  No rashes, ulcers or lesions. EXTREMITIES: No edema, no skin discoloration or tenderness.  No palpable cords. NEUROLOGICAL: Unremarkable. PSYCH:  Appropriate.   Results for orders placed or performed during the hospital encounter of 06/28/17 (from the past 48 hour(s))  Glucose, capillary     Status: Abnormal   Collection Time: 06/28/17  9:42 PM  Result Value Ref Range   Glucose-Capillary 115 (H) 65 - 99  mg/dL  Basic metabolic panel     Status: Abnormal   Collection Time: 06/29/17  4:44 AM  Result Value Ref Range   Sodium 132 (L) 135 - 145 mmol/L   Potassium 3.2 (L) 3.5 - 5.1 mmol/L   Chloride 101 101 - 111 mmol/L   CO2 22 22 - 32 mmol/L   Glucose, Bld 114 (H) 65 - 99 mg/dL   BUN 9 6 - 20 mg/dL   Creatinine, Ser 0.68 0.44 - 1.00 mg/dL   Calcium 8.0 (L) 8.9 - 10.3 mg/dL   GFR calc non Af Amer >60 >60 mL/min    GFR calc Af Amer >60 >60 mL/min    Comment: (NOTE) The eGFR has been calculated using the CKD EPI equation. This calculation has not been validated in all clinical situations. eGFR's persistently <60 mL/min signify possible Chronic Kidney Disease.    Anion gap 9 5 - 15    Comment: Performed at [facility], [address] [place], [place] [address]  CBC     Status: Abnormal   Collection Time: 06/29/17  4:44 AM  Result Value Ref Range   WBC 0.4 (LL) 3.6 - 11.0 K/uL    Comment: CRITICAL RESULT CALLED TO, READ BACK BY AND VERIFIED WITH:  [facility] KLENNER AT 0522 06/29/17 SDR    RBC 3.69 (L) 3.80 - 5.20 MIL/uL   Hemoglobin 11.2 (L) 12.0 - 16.0 g/dL   HCT 32.8 (L) 35.0 - 47.0 %   MCV 88.9 80.0 - 100.0 fL   MCH 30.4 26.0 - 34.0 pg   MCHC 34.1 32.0 - 36.0 g/dL   RDW 13.5 11.5 - 14.5 %   Platelets 78 (L) 150 - 440 K/uL    Comment: Performed at [facility], [address] [place], [place] [address]  Glucose, capillary     Status: None   Collection Time: 06/29/17  7:36 AM  Result Value Ref Range   Glucose-Capillary 98 65 - 99 mg/dL  Glucose, capillary     Status: Abnormal   Collection Time: 06/29/17 11:32 AM  Result Value Ref Range   Glucose-Capillary 148 (H) 65 - 99 mg/dL  Glucose, capillary     Status: None   Collection Time: 06/29/17  5:08 PM  Result Value Ref Range   Glucose-Capillary 98 65 - 99 mg/dL  Glucose, capillary     Status: None   Collection Time: 06/29/17  9:02 PM  Result Value Ref Range   Glucose-Capillary 97 65 - 99 mg/dL  Basic metabolic panel     Status: Abnormal   Collection Time: 06/30/17  5:07 AM  Result Value Ref Range   Sodium 137 135 - 145 mmol/L   Potassium 3.8 3.5 - 5.1 mmol/L   Chloride 105 101 - 111 mmol/L   CO2 23 22 - 32 mmol/L   Glucose, Bld 105 (H) 65 - 99 mg/dL   BUN 12 6 - 20 mg/dL   Creatinine, Ser 0.69 0.44 - 1.00 mg/dL   Calcium 8.5 (L) 8.9 - 10.3 mg/dL   GFR calc non Af Amer >60 >60 mL/min   GFR calc Af Amer >60  >60 mL/min    Comment: (NOTE) The eGFR has been calculated using the CKD EPI equation. This calculation has not been validated in all clinical situations. eGFR's persistently <60 mL/min signify possible Chronic Kidney Disease.    Anion gap 9 5 - 15    Comment: Performed at [facility], [address] [place], [place] [address]  CBC with Differential     Status: Abnormal   Collection Time: 06/30/17  5:07 AM  Result Value Ref Range   WBC 0.4 (LL) 3.6 - 11.0 K/uL    Comment: RESULT REPEATED AND VERIFIED CRITICAL VALUE NOTED.  VALUE IS CONSISTENT WITH PREVIOUSLY REPORTED AND CALLED VALUE.    RBC 3.94 3.80 - 5.20 MIL/uL   Hemoglobin 11.8 (L) 12.0 - 16.0 g/dL   HCT 35.3 35.0 - 47.0 %   MCV 89.6 80.0 - 100.0 fL   MCH 29.9 26.0 - 34.0 pg   MCHC 33.4 32.0 - 36.0 g/dL   RDW 13.7 11.5 - 14.5 %   Platelets 85 (L) 150 - 440 K/uL   Neutrophils Relative % 6 %   Lymphocytes Relative 80 %   Monocytes Relative 0 %   Eosinophils Relative 4 %   Basophils Relative 8 %   Band Neutrophils 2 %   Metamyelocytes Relative 0 %   Myelocytes 0 %   Promyelocytes Absolute 0 %   Blasts 0 %   nRBC 0 0 /100 WBC   Other 0 %   Neutro Abs 0.0 (L) 1.4 - 6.5 K/uL   Lymphs Abs 0.4 (L) 1.0 - 3.6 K/uL   Monocytes Absolute 0.0 (L) 0.2 - 0.9 K/uL   Eosinophils Absolute 0.0 0 - 0.7 K/uL   Basophils Absolute 0.0 0 - 0.1 K/uL   RBC Morphology MIXED RBC POPULATION    WBC Morphology DOHLE BODIES     Comment: Performed at [facility], [address] [place], [place] [address]  Glucose, capillary     Status: None   Collection Time: 06/30/17  7:30 AM  Result Value Ref Range   Glucose-Capillary 99 65 - 99 mg/dL  Glucose, capillary     Status: Abnormal   Collection Time: 06/30/17 11:52 AM  Result Value Ref Range   Glucose-Capillary 112 (H) 65 - 99 mg/dL  Glucose, capillary     Status: Abnormal   Collection Time: 06/30/17  4:40 PM  Result Value Ref Range   Glucose-Capillary 120 (H) 65 - 99  mg/dL   No results found.  Assessment:  JESSICAANN OVERBAUGH is a 72 y.o. female with multi-focal triple negative clinical stage [id number] right breast cancer who was admitted on day 6 of cycle #1 AC chemotherapy with fever and influenza A.  Symptomatically, she is feeling better.  Exam reveals coarse crackles at the bases and scattered wheezes.  Plan: 1.  Oncology:  Triple negative breast cancer.  Day 8 s/p cycle #1 AC with Neulasta Margarette Canada) support.  Anticipate count recovery later this week.  Discussed plan for next cycle of chemotherapy on 07/14/2017.  2.  Hematology:  Patient neutropenic despite Neulasta support.  ANC 0.  Maintain neutropenic precautions.  Neutropenic diet.  No rectal suppositories.  Thrombocytopenia secondary to chemotherapy and infection.  Platelet count slowly improving.  On heparin for DVT prophylaxis.  Check counts daily.  3.  Infectious disease:  Influenza A on Tamiflu.  Afebrile.  If patient develops a fever (100.4 or higher), would require fever work-up with blood cultures and initiation of broad spectrum antibiotics as patient is neutropenic.  4.  Code status:  Patient stated that she was confused with initial conversation about code status.  She is FULL CODEunless her condition is irreversible.  Addendum:  Dr. Grayland Ormond is covering tomorrow then Dr. Janese Banks the remainder of the week.   Lequita Asal, MD  06/30/2017, 8:37 PM

## 2017-06-30 NOTE — Progress Notes (Signed)
Pt  On isolation since admission.for droplet for pos flu. Cont to instruct. On isolation protocol.

## 2017-06-30 NOTE — Evaluation (Signed)
Physical Therapy Evaluation Patient Details Name: Stephanie Sanders MRN: [id number] DOB: 1945/09/26 Today's Date: 06/30/2017   History of Present Illness  presented to ER from cancer center secondary to progressive weakness, fever after first round of chemo; admitted with sepsis related to influenza A.  Clinical Impression  Upon evaluation, patient alert and oriented; follows all commands and demonstrates good awareness/insight into functional mobility and safety needs.  Bilat UE/LE strength and ROM grossly symmetrical and WFL; no focal weakness appreciated.  Able to complete bed mobility with mod indep; sit/stand, basic transfers and gait (40') without assist device, distant sup/mod indep.  Slightly guarded gait performance (due to generalized fatigue), but no overt buckling, LOB or safety issues.  Good standing balance with good, stable functional reach noted during mobility tasks. Appears at baseline level of functional ability without need for acute PT identified.  Did educate regarding activity pacing and progressive resumption of activity; encouraged continued, intermittent mobility throughout hospital stay.  Patient/family voiced understanding and agreement.   Will complete initial PT order at this time.  Please re-consult should needs change.    Follow Up Recommendations No PT follow up    Equipment Recommendations       Recommendations for Other Services       Precautions / Restrictions Precautions Precautions: Fall Restrictions Weight Bearing Restrictions: No      Mobility  Bed Mobility Overal bed mobility: Modified Independent                Transfers Overall transfer level: Modified independent Equipment used: None                Ambulation/Gait Ambulation/Gait assistance: Supervision;Modified independent (Device/Increase time) Ambulation Distance (Feet): 40 Feet Assistive device: None       General Gait Details: reciprocal stepping pattern with fair  step height/lenght; slightly guarded cadence, but no overt buckling, LOB or safety concerns.  Stairs            Wheelchair Mobility    Modified Rankin (Stroke Patients Only)       Balance Overall balance assessment: Modified Independent                                           Pertinent Vitals/Pain Pain Assessment: No/denies pain    Home Living Family/patient expects to be discharged to:: Private residence Living Arrangements: Spouse/significant other Available Help at Discharge: Family Type of Home: House Home Access: Stairs to enter Entrance Stairs-Rails: Right Entrance Stairs-Number of Steps: 4 Home Layout: One level        Prior Function Level of [place]: Independent         Comments: Indep with ADLs, household and community mobilization without assist device; denies fall history; no home O2.     Hand Dominance        Extremity/Trunk Assessment   Upper Extremity Assessment Upper Extremity Assessment: Overall WFL for tasks assessed    Lower Extremity Assessment Lower Extremity Assessment: Overall WFL for tasks assessed(grossly at least 4+/5 throughout)       Communication   Communication: No difficulties  Cognition Arousal/Alertness: Awake/alert Behavior During Therapy: WFL for tasks assessed/performed Overall Cognitive Status: Within Functional Limits for tasks assessed  General Comments      Exercises Other Exercises Other Exercises: Toilet transfer, ambulatory without assist device, mod indep; standing balance for clothing management and hand hygiene, mod indep.  Good functional reach with good awareness of limits of stability.   Assessment/Plan    PT Assessment Patent does not need any further PT services  PT Problem List         PT Treatment Interventions      PT Goals (Current goals can be found in the Care Plan section)  Acute Rehab PT  Goals Patient Stated Goal: to return home PT Goal Formulation: All assessment and education complete, DC therapy    Frequency     Barriers to discharge        Co-evaluation               AM-PAC PT "6 Clicks" Daily Activity  Outcome Measure Difficulty turning over in bed (including adjusting bedclothes, sheets and blankets)?: None Difficulty moving from lying on back to sitting on the side of the bed? : None Difficulty sitting down on and standing up from a chair with arms (e.g., wheelchair, bedside commode, etc,.)?: None Help needed moving to and from a bed to chair (including a wheelchair)?: None   Help needed climbing 3-5 steps with a railing? : None 6 Click Score: 20    End of Session Equipment Utilized During Treatment: Gait belt Activity Tolerance: Patient tolerated treatment well Patient left: in bed;with call bell/phone within reach(fall risk score 4, alarm not required) Nurse Communication: Mobility status PT Visit Diagnosis: Muscle weakness (generalized) (M62.81)    Time: 7619-5093 PT Time Calculation (min) (ACUTE ONLY): 17 min   Charges:   PT Evaluation $PT Eval Low Complexity: 1 Low PT Treatments $Therapeutic Activity: 8-22 mins   PT G Codes:        Jerard Bays H. Owens Shark, PT, DPT, NCS 06/30/17, 4:02 PM [phone]

## 2017-06-30 NOTE — Progress Notes (Signed)
[facility] NAME: Stephanie Sanders    MR#:  [id number]  DATE OF BIRTH:  27-Dec-1945  SUBJECTIVE:  CHIEF COMPLAINT:   Chief Complaint  Patient presents with  . Code Sepsis   Still has cough. Dry. Flu A No fever, no bleeding  REVIEW OF SYSTEMS:  CONSTITUTIONAL: Positive  fatigue and weakness.  EYES: No blurred or double vision.  EARS, NOSE, AND THROAT: No tinnitus or ear pain.  RESPIRATORY: Cough. CARDIOVASCULAR: No chest pain, orthopnea, edema.  GASTROINTESTINAL: No nausea, vomiting, diarrhea or abdominal pain.  GENITOURINARY: No dysuria, hematuria.  ENDOCRINE: No polyuria, nocturia,  HEMATOLOGY: No anemia, easy bruising or bleeding SKIN: No rash or lesion. MUSCULOSKELETAL: No joint pain or arthritis.   NEUROLOGIC: No tingling, numbness, weakness.  PSYCHIATRY: No anxiety or depression.   ROS  DRUG ALLERGIES:   Allergies  Allergen Reactions  . Fiorinal [Butalbital-Aspirin-Caffeine] Nausea And Vomiting and Other (See Comments)    SEVERE HEADACHE  . Other     STERI-STRIPS-RASH  . Sulfa Antibiotics Other (See Comments)    Burning esophagus and stomach  . Tramadol Nausea And Vomiting    VITALS:  Blood pressure (!) 142/73, pulse 87, temperature 98.6 F (37 C), temperature source Oral, resp. rate 20, height 5\' 8"  (1.727 m), weight 70.5 kg (155 lb 6.8 oz), SpO2 96 %.  PHYSICAL EXAMINATION:  GENERAL:  72 y.o.-year-old patient lying in the bed with no acute distress.  EYES: Pupils equal, round, reactive to light and accommodation. No scleral icterus. Extraocular muscles intact.  HEENT: Head atraumatic, normocephalic. Oropharynx and nasopharynx clear.  NECK:  Supple, no jugular venous distention. No thyroid enlargement, no tenderness.  LUNGS: Normal breath sounds bilaterally, no wheezing, some crepitation. No use of accessory muscles of respiration.  CARDIOVASCULAR: S1, S2 normal. No murmurs, rubs, or gallops.  ABDOMEN: Soft,  nontender, nondistended. Bowel sounds present. No organomegaly or mass.  EXTREMITIES: No pedal edema, cyanosis, or clubbing.  NEUROLOGIC: Cranial nerves II through XII are intact. Muscle strength 4-5/5 in all extremities. Sensation intact. Gait not checked.  PSYCHIATRIC: The patient is alert and oriented x 3.  SKIN: No obvious rash, lesion, or ulcer.   Physical Exam LABORATORY PANEL:   CBC Recent Labs  Lab 06/30/17 0507  WBC 0.4*  HGB 11.8*  HCT 35.3  PLT 85*   ------------------------------------------------------------------------------------------------------------------  Chemistries  Recent Labs  Lab 06/28/17 0633  06/30/17 0507  NA 133*   < > 137  K 3.6   < > 3.8  CL 99*   < > 105  CO2 24   < > 23  GLUCOSE 130*   < > 105*  BUN 12   < > 12  CREATININE 0.82   < > 0.69  CALCIUM 8.7*   < > 8.5*  AST 22  --   --   ALT 19  --   --   ALKPHOS 57  --   --   BILITOT 0.9  --   --    < > = values in this interval not displayed.   ------------------------------------------------------------------------------------------------------------------  Cardiac Enzymes Recent Labs  Lab 06/28/17 0633  TROPONINI <0.03   ------------------------------------------------------------------------------------------------------------------  RADIOLOGY:  No results found.  ASSESSMENT AND PLAN:   Principal Problem:   Sepsis ([place]) Active Problems:   Malignant neoplasm of right breast in female, estrogen receptor negative (HCC)   Influenza A   Chemotherapy-induced neutropenia (HCC)   Thrombocytopenia ([place])  * Sepsis due to  Influenza A Tamilfu IVF B cx NGTD  * Neutropenia   She had received Neulasta after chemo.  Still neutropenic. repeatlabs in the morning  * Diabetes   SSI  * hypertension   Continue lisinopril.   All the records are reviewed and case discussed with Care Management/Social Workerr. Management plans discussed with the patient, family and they are in  agreement.  CODE STATUS: FULL  TOTAL TIME TAKING CARE OF THIS PATIENT: 35 minutes.   POSSIBLE D/C IN 1-2 DAYS, DEPENDING ON CLINICAL CONDITION.  Neita Carp M.D on 06/30/2017   Between 7am to 6pm - Pager - [phone]  After 6pm go to [email or web address] - password [email or web address] [facility]  Office  [phone]  CC: Primary care physician; Baxter Hire, MD  Note: This dictation was prepared with Dragon dictation along with smaller phrase technology. Any transcriptional errors that result from this process are unintentional.

## 2017-07-01 LAB — CBC WITH DIFFERENTIAL/PLATELET
Basophils Absolute: 0 10*3/uL (ref 0–0.1)
Basophils Relative: 0 %
Eosinophils Absolute: 0 10*3/uL (ref 0–0.7)
Eosinophils Relative: 6 %
HCT: 33.5 % — ABNORMAL LOW (ref 35.0–47.0)
Hemoglobin: 11.4 g/dL — ABNORMAL LOW (ref 12.0–16.0)
Lymphocytes Relative: 79 %
Lymphs Abs: 0.4 10*3/uL — ABNORMAL LOW (ref 1.0–3.6)
MCH: 30.1 pg (ref 26.0–34.0)
MCHC: 34.1 g/dL (ref 32.0–36.0)
MCV: 88.5 fL (ref 80.0–100.0)
Monocytes Absolute: 0 10*3/uL — ABNORMAL LOW (ref 0.2–0.9)
Monocytes Relative: 9 %
Neutro Abs: 0 10*3/uL — ABNORMAL LOW (ref 1.4–6.5)
Neutrophils Relative %: 6 %
Platelets: 71 10*3/uL — ABNORMAL LOW (ref 150–440)
RBC: 3.79 MIL/uL — ABNORMAL LOW (ref 3.80–5.20)
RDW: 13.7 % (ref 11.5–14.5)
WBC: 0.5 10*3/uL — CL (ref 3.6–11.0)

## 2017-07-01 LAB — GLUCOSE, CAPILLARY: Glucose-Capillary: 95 mg/dL (ref 65–99)

## 2017-07-01 MED ORDER — PREDNISONE 50 MG PO TABS: 50.0000 mg | ORAL_TABLET | Freq: Once | ORAL | Status: DC

## 2017-07-01 MED ORDER — OSELTAMIVIR PHOSPHATE 75 MG PO CAPS: 75.0000 mg | ORAL_CAPSULE | Freq: Two times a day (BID) | ORAL | 0 refills | Status: DC

## 2017-07-01 MED ORDER — PREDNISONE 50 MG PO TABS: 50.0000 mg | ORAL_TABLET | Freq: Every day | ORAL | 0 refills | Status: DC

## 2017-07-01 MED ORDER — ALBUTEROL SULFATE HFA 108 (90 BASE) MCG/ACT IN AERS
2.0000 | INHALATION_SPRAY | Freq: Four times a day (QID) | RESPIRATORY_TRACT | 0 refills | Status: DC | PRN
Start: 2017-07-01 — End: 2017-07-28

## 2017-07-01 MED ORDER — GUAIFENESIN-DM 100-10 MG/5ML PO SYRP: 5.0000 mL | ORAL_SOLUTION | ORAL | 0 refills | Status: DC | PRN

## 2017-07-01 NOTE — Discharge Instructions (Signed)
Resume diet and activity as before ° ° °

## 2017-07-01 NOTE — Care Management Important Message (Signed)
Important Message  Patient Details  Name: Stephanie Sanders MRN: [id number] Date of Birth: 02/23/1946   Medicare Important Message Given:  Yes    Shelbie Ammons, RN 07/01/2017, 6:46 AM

## 2017-07-03 LAB — CULTURE, BLOOD (ROUTINE X 2): Culture: NO GROWTH

## 2017-07-03 NOTE — Discharge Summary (Signed)
[facility] [place] NAME: Stephanie Sanders    MR#:  [id number]  DATE OF BIRTH:  1945-06-10  DATE OF ADMISSION:  06/28/2017 ADMITTING PHYSICIAN: [name], MD  DATE OF DISCHARGE: 07/01/2017  2:00 PM  PRIMARY CARE PHYSICIAN: Baxter Hire, MD   ADMISSION DIAGNOSIS:  Cough [R05] Influenza A [J10.1] Malaise and fatigue [R53.81, R53.83] Sepsis, due to unspecified organism [facility]) [A41.9] Immunosuppressed due to chemotherapy [Z79.899]  DISCHARGE DIAGNOSIS:  Principal Problem:   Sepsis ([place]) Active Problems:   Malignant neoplasm of right breast in female, estrogen receptor negative ([place])   Influenza A   Chemotherapy-induced neutropenia ([place])   Thrombocytopenia ([place])   SECONDARY DIAGNOSIS:   Past Medical History:  Diagnosis Date  . Cancer ([place])   . Diabetes mellitus without complication ([place])   . Diabetic nephropathy ([place])   . Headache    migraines  . Heart murmur    ASYMPTOMATIC  . Hypertension   . Hypothyroidism   . Seborrheic keratosis   . Vitamin D deficiency      ADMITTING HISTORY  HISTORY OF PRESENT ILLNESS: Stephanie Sanders  is a 72 y.o. female with a known history of breast cancer, diabetes, diabetic neuropathy, headache, hypertension, hypothyroidism, vitamin D deficiency- received her first chemotherapy last week. She had fever after 2 days of chemotherapy and she went to [facility], they suspected it may be pneumonia and give her oral Levaquin tablets which she is taking for last 2 days. She also had symptoms of having fever, cough, shortness of breath. Continued to feel generalized weakness in spite of taking antibiotics so came to emergency room today. Noted to have no pneumonia on chest x-ray, but her flu test was positive, she was also noted to have tachycardia, tachypnea, fever so ER physician suggested to admit to hospitalist team as she is immunosuppressed due to her chemotherapy.  HOSPITAL COURSE:   *  Sepsis due to influenza A * neutropenia * Diabetes * HTN  Treated with IV fluids, Tamiflu.  Blood cultures negative.  Patient remained afebrile for 48 hours prior to discharge.  Continues to have neutropenia.  Discussed with Dr. Grayland Ormond.  Follow-up at [facility].  Patient continues to have dry cough.  Not needing oxygen.  Afebrile.  No shortness of breath.  Discharged home on Tamiflu, prednisone, albuterol and cough medication.  CONSULTS OBTAINED:  Treatment Team:  Lloyd Huger, MD  DRUG ALLERGIES:   Allergies  Allergen Reactions  . Fiorinal [Butalbital-Aspirin-Caffeine] Nausea And Vomiting and Other (See Comments)    SEVERE HEADACHE  . Other     STERI-STRIPS-RASH  . Sulfa Antibiotics Other (See Comments)    Burning esophagus and stomach  . Tramadol Nausea And Vomiting    DISCHARGE MEDICATIONS:   Allergies as of 07/01/2017      Reactions   Fiorinal [butalbital-aspirin-caffeine] Nausea And Vomiting, Other (See Comments)   SEVERE HEADACHE   Other    STERI-STRIPS-RASH   Sulfa Antibiotics Other (See Comments)   Burning esophagus and stomach   Tramadol Nausea And Vomiting      Medication List    STOP taking these medications   levofloxacin 500 MG tablet Commonly known as:  LEVAQUIN     TAKE these medications   acetaminophen 500 MG tablet Commonly known as:  TYLENOL Take 1,000 mg by mouth every 6 (six) hours as needed (for headaches.).   albuterol 108 (90 Base) MCG/ACT inhaler Commonly known as:  PROVENTIL HFA;VENTOLIN HFA Inhale 2  puffs into the lungs every 6 (six) hours as needed for wheezing or shortness of breath.   aspirin EC 81 MG tablet Take 81 mg by mouth daily.   aspirin-acetaminophen-caffeine 250-250-65 MG tablet Commonly known as:  EXCEDRIN MIGRAINE Take 2 tablets by mouth every 6 (six) hours as needed for headache.   atorvastatin 10 MG tablet Commonly known as:  LIPITOR Take 10 mg by mouth every evening. Notes to patient:  07/01/2017    Cholecalciferol 2000 units Caps Take 4,000 Units by mouth daily. Notes to patient:  07/02/2017   colesevelam 625 MG tablet Commonly known as:  WELCHOL Take 1,875 mg by mouth every evening.   dexamethasone 4 MG tablet Commonly known as:  DECADRON Take 2 tablets by mouth once a day on the day after chemotherapy and then take 2 tablets two times a day for 2 days. Take with food.   guaiFENesin-dextromethorphan 100-10 MG/5ML syrup Commonly known as:  ROBITUSSIN DM Take 5 mLs by mouth every 4 (four) hours as needed for cough.   levothyroxine 50 MCG tablet Commonly known as:  SYNTHROID, LEVOTHROID Take 50 mcg by mouth daily before breakfast.   lidocaine-prilocaine cream Commonly known as:  EMLA Apply 1 application topically as needed.   LORazepam 0.5 MG tablet Commonly known as:  ATIVAN Take 1 tablet (0.5 mg total) by mouth every 6 (six) hours as needed (Nausea or vomiting).   Melatonin 5 MG Tabs Take 5 mg by mouth at bedtime as needed (for sleep.).   metFORMIN 1000 MG tablet Commonly known as:  GLUCOPHAGE Take 1,000 mg by mouth 2 (two) times daily.   ondansetron 8 MG tablet Commonly known as:  ZOFRAN Take 1 tablet (8 mg total) by mouth 2 (two) times daily as needed. Start on the third day after chemotherapy.   oseltamivir 75 MG capsule Commonly known as:  TAMIFLU Take 1 capsule (75 mg total) by mouth 2 (two) times daily.   predniSONE 50 MG tablet Commonly known as:  DELTASONE Take 1 tablet (50 mg total) by mouth daily with breakfast.   quinapril 5 MG tablet Commonly known as:  ACCUPRIL Take 5 mg by mouth every morning.   sodium bicarbonate 650 MG tablet Take 1,300 mg by mouth at bedtime.       Today   VITAL SIGNS:  Blood pressure (!) 125/59, pulse 79, temperature 98.2 F (36.8 C), temperature source Oral, resp. rate 19, height 5\' 8"  (1.727 m), weight 70.5 kg (155 lb 6.8 oz), SpO2 99 %.  I/O:  No intake or output data in the 24 hours ending 07/03/17  1552  PHYSICAL EXAMINATION:  Physical Exam  GENERAL:  72 y.o.-year-old patient lying in the bed with no acute distress. LUNGS: Normal breath sounds bilaterally, no wheezing, rales,rhonchi or crepitation. No use of accessory muscles of respiration. CARDIOVASCULAR: S1, S2 normal. No murmurs, rubs, or gallops. ABDOMEN: Soft, non-tender, non-distended. Bowel sounds present. No organomegaly or mass. NEUROLOGIC: Moves all 4 extremities. PSYCHIATRIC: The patient is alert and oriented x 3. SKIN: No obvious rash, lesion, or ulcer.  DATA REVIEW:   CBC Recent Labs  Lab 07/01/17 0336  WBC 0.5*  HGB 11.4*  HCT 33.5*  PLT 71*    Chemistries  Recent Labs  Lab 06/28/17 0633  06/30/17 0507  NA 133*   < > 137  K 3.6   < > 3.8  CL 99*   < > 105  CO2 24   < > 23  GLUCOSE 130*   < >  105*  BUN 12   < > 12  CREATININE 0.82   < > 0.69  CALCIUM 8.7*   < > 8.5*  AST 22  --   --   ALT 19  --   --   ALKPHOS 57  --   --   BILITOT 0.9  --   --    < > = values in this interval not displayed.    Cardiac Enzymes Recent Labs  Lab 06/28/17 3893  TROPONINI <0.03    Microbiology Results  Results for orders placed or performed during the hospital encounter of 06/28/17  Culture, blood (Routine x 2)     Status: None   Collection Time: 06/28/17  6:09 AM  Result Value Ref Range Status   Specimen Description BLOOD RIGHT ANTECUBITAL  Final   Special Requests   Final    BOTTLES DRAWN AEROBIC AND ANAEROBIC Blood Culture results may not be optimal due to an excessive volume of blood received in culture bottles   Culture   Final    NO GROWTH 5 DAYS Performed at [facility], [address], [place], [place] [address]    Report Status 07/03/2017 FINAL  Final  Urine culture     Status: None   Collection Time: 06/28/17  6:33 AM  Result Value Ref Range Status   Specimen Description   Final    URINE, RANDOM Performed at [facility], [address], [place], [place] [address]     Special Requests   Final    NONE Performed at [facility], [address], [place], [place] [address]    Culture   Final    NO GROWTH Performed at [facility], [address] [place], [place] [address]    Report Status 06/29/2017 FINAL  Final    RADIOLOGY:  No results found.  Follow up with PCP in 1 week.  Management plans discussed with the patient, family and they are in agreement.  CODE STATUS:  Code Status History    Date Active Date Inactive Code Status Order ID Comments User Context   06/29/2017 22:04 07/01/2017 18:46 Full Code [id number]  Lequita Asal, MD Inpatient   06/28/2017 08:58 06/29/2017 22:04 DNR [id number]  [name], MD ED    Advance Directive Documentation     Most Recent Value  Type of Advance Directive  Healthcare Power of Attorney  Pre-existing out of facility DNR order (yellow form or pink MOST form)  No data  "MOST" Form in Place?  No data      TOTAL TIME TAKING CARE OF THIS PATIENT ON DAY OF DISCHARGE: more than 30 minutes.   Neita Carp M.D on 07/03/2017 at 3:52 PM  Between 7am to 6pm - Pager - [phone]  After 6pm go to [email or web address] - password [email or web address] [facility]  Office  [phone]  CC: Primary care physician; Baxter Hire, MD  Note: This dictation was prepared with Dragon dictation along with smaller phrase technology. Any transcriptional errors that result from this process are unintentional.

## 2017-07-06 ENCOUNTER — Other Ambulatory Visit: Payer: Self-pay | Admitting: *Deleted

## 2017-07-06 DIAGNOSIS — Z171 Estrogen receptor negative status [ER-]: Principal | ICD-10-CM

## 2017-07-06 DIAGNOSIS — C50911 Malignant neoplasm of unspecified site of right female breast: Secondary | ICD-10-CM

## 2017-07-07 ENCOUNTER — Inpatient Hospital Stay: Payer: Medicare Other

## 2017-07-07 ENCOUNTER — Telehealth: Payer: Self-pay | Admitting: *Deleted

## 2017-07-07 ENCOUNTER — Ambulatory Visit
Admission: RE | Admit: 2017-07-07 | Discharge: 2017-07-07 | Disposition: A | Discharge status: Home / Self Care | Payer: Medicare Other | Source: Ambulatory Visit | Attending: Hematology and Oncology | Admitting: Hematology and Oncology

## 2017-07-07 ENCOUNTER — Encounter: Payer: Self-pay | Admitting: Hematology and Oncology

## 2017-07-07 ENCOUNTER — Inpatient Hospital Stay (HOSPITAL_BASED_OUTPATIENT_CLINIC_OR_DEPARTMENT_OTHER): Payer: Medicare Other | Admitting: Hematology and Oncology

## 2017-07-07 VITALS — BP 142/79 | HR 80 | Temp 98.5°F | Resp 18 | Wt 148.0 lb

## 2017-07-07 DIAGNOSIS — Z79899 Other long term (current) drug therapy: Secondary | ICD-10-CM | POA: Diagnosis not present

## 2017-07-07 DIAGNOSIS — J101 Influenza due to other identified influenza virus with other respiratory manifestations: Secondary | ICD-10-CM

## 2017-07-07 DIAGNOSIS — R5381 Other malaise: Secondary | ICD-10-CM | POA: Diagnosis not present

## 2017-07-07 DIAGNOSIS — C50911 Malignant neoplasm of unspecified site of right female breast: Secondary | ICD-10-CM

## 2017-07-07 DIAGNOSIS — Z171 Estrogen receptor negative status [ER-]: Secondary | ICD-10-CM | POA: Diagnosis not present

## 2017-07-07 DIAGNOSIS — Z959 Presence of cardiac and vascular implant and graft, unspecified: Secondary | ICD-10-CM | POA: Diagnosis not present

## 2017-07-07 DIAGNOSIS — I10 Essential (primary) hypertension: Secondary | ICD-10-CM | POA: Diagnosis not present

## 2017-07-07 DIAGNOSIS — C50211 Malignant neoplasm of upper-inner quadrant of right female breast: Secondary | ICD-10-CM | POA: Diagnosis present

## 2017-07-07 DIAGNOSIS — R110 Nausea: Secondary | ICD-10-CM | POA: Diagnosis not present

## 2017-07-07 DIAGNOSIS — R509 Fever, unspecified: Secondary | ICD-10-CM | POA: Diagnosis not present

## 2017-07-07 DIAGNOSIS — Z5189 Encounter for other specified aftercare: Secondary | ICD-10-CM | POA: Diagnosis not present

## 2017-07-07 DIAGNOSIS — E119 Type 2 diabetes mellitus without complications: Secondary | ICD-10-CM | POA: Diagnosis not present

## 2017-07-07 DIAGNOSIS — J90 Pleural effusion, not elsewhere classified: Secondary | ICD-10-CM | POA: Diagnosis not present

## 2017-07-07 DIAGNOSIS — Z006 Encounter for examination for normal comparison and control in clinical research program: Secondary | ICD-10-CM | POA: Diagnosis present

## 2017-07-07 DIAGNOSIS — Z7189 Other specified counseling: Secondary | ICD-10-CM

## 2017-07-07 DIAGNOSIS — R05 Cough: Secondary | ICD-10-CM | POA: Diagnosis present

## 2017-07-07 DIAGNOSIS — R062 Wheezing: Secondary | ICD-10-CM | POA: Diagnosis not present

## 2017-07-07 LAB — CBC WITH DIFFERENTIAL/PLATELET
Basophils Absolute: 0 10*3/uL (ref 0–0.1)
Basophils Relative: 1 %
Eosinophils Absolute: 0 10*3/uL (ref 0–0.7)
Eosinophils Relative: 0 %
HCT: 37.4 % (ref 35.0–47.0)
Hemoglobin: 13.1 g/dL (ref 12.0–16.0)
Lymphocytes Relative: 13 %
Lymphs Abs: 0.8 10*3/uL — ABNORMAL LOW (ref 1.0–3.6)
MCH: 31 pg (ref 26.0–34.0)
MCHC: 35.1 g/dL (ref 32.0–36.0)
MCV: 88.1 fL (ref 80.0–100.0)
Monocytes Absolute: 0.6 10*3/uL (ref 0.2–0.9)
Monocytes Relative: 10 %
Neutro Abs: 4.8 10*3/uL (ref 1.4–6.5)
Neutrophils Relative %: 76 %
Platelets: 259 10*3/uL (ref 150–440)
RBC: 4.25 MIL/uL (ref 3.80–5.20)
RDW: 14 % (ref 11.5–14.5)
WBC: 6.3 10*3/uL (ref 3.6–11.0)

## 2017-07-07 LAB — COMPREHENSIVE METABOLIC PANEL
ALT: 29 U/L (ref 14–54)
AST: 24 U/L (ref 15–41)
Albumin: 3.9 g/dL (ref 3.5–5.0)
Alkaline Phosphatase: 47 U/L (ref 38–126)
Anion gap: 11 (ref 5–15)
BUN: 9 mg/dL (ref 6–20)
CO2: 22 mmol/L (ref 22–32)
Calcium: 9.4 mg/dL (ref 8.9–10.3)
Chloride: 102 mmol/L (ref 101–111)
Creatinine, Ser: 0.7 mg/dL (ref 0.44–1.00)
GFR calc Af Amer: >60 mL/min (ref >60)
GFR calc non Af Amer: >60 mL/min (ref >60)
Glucose, Bld: 152 mg/dL — ABNORMAL HIGH (ref 65–99)
Potassium: 3.5 mmol/L (ref 3.5–5.1)
Sodium: 135 mmol/L (ref 135–145)
Total Bilirubin: 0.7 mg/dL (ref 0.3–1.2)
Total Protein: 6.7 g/dL (ref 6.5–8.1)

## 2017-07-07 NOTE — Progress Notes (Signed)
[facility] day:  07/07/2017   Chief Complaint: Stephanie Sanders is a 72 y.o. female with multi-focal right breast cancer who is seen for assessment after interval hospitalization on day 15 s/p cycle #1 AC.  HPI:  The patient was last seen in the medical oncology clinic by me on 06/18/2017.  At that time, she felt "tired" and had diarrhea on Augmentin. Decision was made to postpone chemotherapy until 06/23/2017. Stool for C diff and GI panel by PCR was negative.   She felt fine on the day of chemotherapy.  She received cycle #1 AC and Udencya on 06/24/2017.  She was seen by Faythe Casa, NP on 06/25/2017.  She presented with a fever 100.6 x 2 days, cough, and chills.  Blood cultures were negative.  She was empirically placed on Levaquin. On follow-up, fever had resolved.   She was admitted to [facility] from 06/28/2017 - 07/01/2017.  She presented with with fever, cough and generalized weakness.  Nasal swab was + for influenza A.  CXR was negative.  She was treated with Tamiflu.    Fever resolved.  She was discharged on Tamiflu, prednisone and albuterol.  CBC on 07/01/2017 revealed a hematocrit of 33.5, hemoglobin 11.4, MCV 88.5, platelets 71,000, WBC 500 with an ANC of 0.  During the interim, she has gradually felt better.  She denies any shortness of breath.  She has a cough with production of clear phlegm is she talks too much or breathes deeply.  She denies any fever.  Tamiflu completed on 07/04/2017.  Steroids completed 3 days after discharge.    She notes decreased appetite and slight nausea.  She is using antacids.  Her hair is coming out.  She plans on having a cyst removed from her back (similar to one removed from her chest in the past that got infected).   Past Medical History:  Diagnosis Date  . Cancer ([place])   . Diabetes mellitus without complication ([place])   . Diabetic nephropathy ([place])   . Headache    migraines  . Heart murmur    ASYMPTOMATIC  . Hypertension   . Hypothyroidism   . Seborrheic keratosis   . Vitamin D deficiency     Past Surgical History:  Procedure Laterality Date  . APPENDECTOMY    . BREAST BIOPSY Right 05/28/2017   right breast bx 3 areas invasive mamm ca lymph node mets  . BREAST CYST ASPIRATION Bilateral    neg  . CHOLECYSTECTOMY    . COLONOSCOPY WITH PROPOFOL N/A 01/01/2015   Procedure: COLONOSCOPY WITH PROPOFOL;  Surgeon: Josefine Class, MD;  Location: [facility] ENDOSCOPY;  Service: Endoscopy;  Laterality: N/A;  . EYE SURGERY     eyelid  . PORTACATH PLACEMENT Right 06/10/2017   Procedure: INSERTION PORT-A-CATH;  Surgeon: Herbert Pun, MD;  Location: [facility] ORS;  Service: General;  Laterality: Right;  . STENT PLACEMENT ILIAC ([place] HX)    . TUBAL LIGATION      Family History  Problem Relation Age of Onset  . Cancer Mother   . Cancer Maternal Aunt   . Breast cancer Neg Hx     Social History:  reports that she quit smoking about 45 years ago. Her smoking use included cigarettes. She has a 20.00 pack-year smoking history. she has never used smokeless tobacco. She reports that she does not drink alcohol or use drugs.  She has 3 children (daughters: age 29 and 56; son: age 29).  She  is retired.  She worked for the [employer].  Her husband has dementia.  She lives in [place].  The patient is accompanied by Yolande Jolly (clinical trials nurse) today.  Allergies:  Allergies  Allergen Reactions  . Fiorinal [Butalbital-Aspirin-Caffeine] Nausea And Vomiting and Other (See Comments)    SEVERE HEADACHE  . Sulfa Antibiotics Other (See Comments)    Burning esophagus and stomach  . Tramadol Nausea And Vomiting  . Other Rash    STERI-STRIPS-RASH STERI-STRIPS-RASH    Current Medications: Current Outpatient Medications  Medication Sig Dispense Refill  . acetaminophen (TYLENOL) 500 MG tablet Take 1,000 mg by mouth every 6 (six) hours as needed (for headaches.).    Marland Kitchen albuterol (PROVENTIL  HFA;VENTOLIN HFA) 108 (90 Base) MCG/ACT inhaler Inhale 2 puffs into the lungs every 6 (six) hours as needed for wheezing or shortness of breath. 1 Inhaler 0  . aspirin EC 81 MG tablet Take 81 mg by mouth daily.    Marland Kitchen aspirin-acetaminophen-caffeine (EXCEDRIN MIGRAINE) 250-250-65 MG tablet Take 2 tablets by mouth every 6 (six) hours as needed for headache.    Marland Kitchen atorvastatin (LIPITOR) 10 MG tablet Take 10 mg by mouth every evening.     . Cholecalciferol 2000 UNITS CAPS Take 4,000 Units by mouth daily.     . colesevelam (WELCHOL) 625 MG tablet Take 1,875 mg by mouth every evening.     Marland Kitchen dexamethasone (DECADRON) 4 MG tablet Take 2 tablets by mouth once a day on the day after chemotherapy and then take 2 tablets two times a day for 2 days. Take with food. 30 tablet 1  . guaiFENesin-dextromethorphan (ROBITUSSIN DM) 100-10 MG/5ML syrup Take 5 mLs by mouth every 4 (four) hours as needed for cough. 118 mL 0  . levothyroxine (SYNTHROID, LEVOTHROID) 50 MCG tablet Take 50 mcg by mouth daily before breakfast.    . lidocaine-prilocaine (EMLA) cream Apply 1 application topically as needed. 30 g 6  . LORazepam (ATIVAN) 0.5 MG tablet Take 1 tablet (0.5 mg total) by mouth every 6 (six) hours as needed (Nausea or vomiting). 30 tablet 0  . Melatonin 5 MG TABS Take 5 mg by mouth at bedtime as needed (for sleep.).    Marland Kitchen metFORMIN (GLUCOPHAGE) 1000 MG tablet Take 1,000 mg by mouth 2 (two) times daily.    . ondansetron (ZOFRAN) 8 MG tablet Take 1 tablet (8 mg total) by mouth 2 (two) times daily as needed. Start on the third day after chemotherapy. 30 tablet 1  . quinapril (ACCUPRIL) 5 MG tablet Take 5 mg by mouth every morning.     . sodium bicarbonate 650 MG tablet Take 1,300 mg by mouth at bedtime.     No current facility-administered medications for this visit.     Review of Systems:  GENERAL:  Feels "better".  No fevers or sweats.  Weight down 7 pounds. PERFORMANCE STATUS (ECOG):  2 HEENT:  No visual changes, runny  nose, sore throat, mouth sores or tenderness. Lungs: No shortness of breath.  Cough (see HPI).  No hemoptysis. Cardiac:  No chest pain, palpitations, orthopnea, or PND. GI:  Decreased appetite.  Slight nausea.  No vomiting, constipation, melena or hematochezia.  Takes Welchol s/p cholecystectomy. GU:  No urgency, frequency, dysuria, or hematuria. Musculoskeletal:  No back pain.  No joint pain.  No muscle tenderness. Extremities:  No pain or swelling. Skin:  No rashes or skin changes. Neuro:  No headache, numbness or weakness, balance or coordination issues. Endocrine:  No diabetes, thyroid issues,  hot flashes or night sweats. Psych:  Stress.  No depression or anxiety. Pain:  No focal pain. Review of systems:  All other systems reviewed and found to be negative.  Physical Exam: Blood pressure (!) 142/79, pulse 80, temperature 98.5 F (36.9 C), temperature source Tympanic, resp. rate 18, weight 148 lb (67.1 kg), SpO2 96 %. GENERAL:  Well developed, well nourished, woman sitting comfortably in the exam room in no acute distress. MENTAL STATUS:  Alert and oriented to person, place and time. HEAD:  Wearing a black cap with strands of hair visible.  Normocephalic, atraumatic, face symmetric, no Cushingoid features. EYES:  Glasses.  Blue eyes.  Pupils equal round and reactive to light and accomodation.  No conjunctivitis or scleral icterus. ENT:  Oropharynx clear without lesion.  Tongue normal. Mucous membranes moist.  RESPIRATORY:  Intermittent cough.  Faint crackles at the bases with deep breathing.  Soft intermittent wheeze on the left. CARDIOVASCULAR:  Regular rate and rhythm without murmur, rub or gallop. CHEST WALL:  Port-a-cath site unremarkable. ABDOMEN:  Soft, non-tender with active bowel sounds and no hepatosplenomegaly.  No masses. SKIN:  No rashes, ulcers or lesions. EXTREMITIES: No edema, no skin discoloration or tenderness.  No palpable cords. LYMPH NODES: No palpable cervical,  supraclavicular, or inguinal adenopathy  NEUROLOGICAL: Unremarkable. PSYCH:  Appropriate.   Appointment on 07/07/2017  Component Date Value Ref Range Status  . Sodium 07/07/2017 135  135 - 145 mmol/L Final  . Potassium 07/07/2017 3.5  3.5 - 5.1 mmol/L Final  . Chloride 07/07/2017 102  101 - 111 mmol/L Final  . CO2 07/07/2017 22  22 - 32 mmol/L Final  . Glucose, Bld 07/07/2017 152* 65 - 99 mg/dL Final  . BUN 07/07/2017 9  6 - 20 mg/dL Final  . Creatinine, Ser 07/07/2017 0.70  0.44 - 1.00 mg/dL Final  . Calcium 07/07/2017 9.4  8.9 - 10.3 mg/dL Final  . Total Protein 07/07/2017 6.7  6.5 - 8.1 g/dL Final  . Albumin 07/07/2017 3.9  3.5 - 5.0 g/dL Final  . AST 07/07/2017 24  15 - 41 U/L Final  . ALT 07/07/2017 29  14 - 54 U/L Final  . Alkaline Phosphatase 07/07/2017 47  38 - 126 U/L Final  . Total Bilirubin 07/07/2017 0.7  0.3 - 1.2 mg/dL Final  . GFR calc non Af Amer 07/07/2017 >60  >60 mL/min Final  . GFR calc Af Amer 07/07/2017 >60  >60 mL/min Final   Comment: (NOTE) The eGFR has been calculated using the CKD EPI equation. This calculation has not been validated in all clinical situations. eGFR's persistently <60 mL/min signify possible Chronic Kidney Disease.   Georgiann Hahn gap 07/07/2017 11  5 - 15 Final   Performed at [facility], [address] [place], [place] [address]  . WBC 07/07/2017 6.3  3.6 - 11.0 K/uL Final  . RBC 07/07/2017 4.25  3.80 - 5.20 MIL/uL Final  . Hemoglobin 07/07/2017 13.1  12.0 - 16.0 g/dL Final  . HCT 07/07/2017 37.4  35.0 - 47.0 % Final  . MCV 07/07/2017 88.1  80.0 - 100.0 fL Final  . MCH 07/07/2017 31.0  26.0 - 34.0 pg Final  . MCHC 07/07/2017 35.1  32.0 - 36.0 g/dL Final  . RDW 07/07/2017 14.0  11.5 - 14.5 % Final  . Platelets 07/07/2017 259  150 - 440 K/uL Final  . Neutrophils Relative % 07/07/2017 76  % Final  . Neutro Abs 07/07/2017 4.8  1.4 - 6.5  K/uL Final  . Lymphocytes Relative 07/07/2017 13  % Final  . Lymphs Abs 07/07/2017 0.8* 1.0 -  3.6 K/uL Final  . Monocytes Relative 07/07/2017 10  % Final  . Monocytes Absolute 07/07/2017 0.6  0.2 - 0.9 K/uL Final  . Eosinophils Relative 07/07/2017 0  % Final  . Eosinophils Absolute 07/07/2017 0.0  0 - 0.7 K/uL Final  . Basophils Relative 07/07/2017 1  % Final  . Basophils Absolute 07/07/2017 0.0  0 - 0.1 K/uL Final   Performed at [facility], [address], [place],  [address]    Assessment:  JOURY ALLCORN is a 72 y.o. 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  Pathology revealed two grade II invasive mammary carcinomas of no special type (4 cm from the nipple and 5 cm from the nipple).  Tumors were in close proximity.  Lymph node biopsy confirmed metastatic carcinoma of breast origin.  Tumor is ER negative (< 1%), PR negative and Her 2/neu 2+ by IHC.  Right sided mammogram and ultrasound on 05/21/2017 revealed 2 highly suspicious masses in the right breast at the 10:30 position 4 cm from nipple (1.8 x 1.2 x 1.1 cm) and at the 10:30 position 5 cm from nipple (1.1 x 0.9 x 0.9 cm).  There was a suspicious 2.7 x 1 x 2.2 cm lymph node in the right axilla with asymmetric nodular cortical thickening.  Ultrasound guided biopsy of the 2 masses and lymph node on 05/28/2017 revealed grade II invasive mammary carcinoma of no special type (4 cm from the nipple and 5 cm from the nipple).  Lymph node biopsy revealed metastatic carcinoma of breast origin.  Tumor is ER negative (< 1%), PR negative and Her 2/neu 2+ by IHC.  FISH was negative.  CA 27.29 was 16.5 and CA15-3 was 17.6 on 06/04/2017.  Echo on 06/09/2017 revealed an EF of 60-65%.  She has enrolled on the UPBEAT clinical trial.  She is day 15 s/p cycle #1 AC (06/23/2017) with Udencya support.  Cycle #1 was complicated by influenza A.  Nadir counts included an ANC of 0 and platelet count of 71,000.  She was admitted to [facility] from 06/28/2017 - 07/01/2017 with with  fever, cough and generalized weakness.  Nasal swab was + for influenza A.  She completed a course of Tamiflu.  Symptomatically, she is fatigued.  She is slowing recovering.  She has a residual cough.  Exam reveals soft crackles at the bases on deep inspiration and intermittent left sided wheezes.  Plan: 1.  Labs today:  CBC with diff, BMP. 2.  Discuss plans for reinitiation of chemotherapy in 1-2 weeks based on recovery from influenza A. 3.  CXR (PA and lateral) today.  Patient to return to clinic after imaging. 4.  Patient to follow-up with dermatology. 5.  RTC in 1 week for MD assessment, labs (CBC with diff, CMP, Mg), and +/- cycle #2 AC.  Addendum:  CXR revealed no acute infiltrate.  There were small bilateral pleural effusions.    Lequita Asal, MD 07/07/2017,4:44 PM

## 2017-07-07 NOTE — Progress Notes (Signed)
Patient states she is feeling much better after having being hospitalized for the flu.  She has residual cough.  Appetite decreased.  No real nausea but she uses tums to settle her stomach after eating.

## 2017-07-07 NOTE — Telephone Encounter (Signed)
Called patient and LVM that we did not draw the blood for her [employer] (genetic testing) that was discussed at her initial visit.  We will have it drawn @ her next lab appt.

## 2017-07-10 ENCOUNTER — Other Ambulatory Visit: Payer: Self-pay | Admitting: Hematology and Oncology

## 2017-07-10 DIAGNOSIS — E118 Type 2 diabetes mellitus with unspecified complications: Secondary | ICD-10-CM

## 2017-07-14 ENCOUNTER — Encounter: Payer: Self-pay | Admitting: *Deleted

## 2017-07-14 ENCOUNTER — Inpatient Hospital Stay (HOSPITAL_BASED_OUTPATIENT_CLINIC_OR_DEPARTMENT_OTHER): Payer: Medicare Other | Admitting: Hematology and Oncology

## 2017-07-14 ENCOUNTER — Encounter: Payer: Self-pay | Admitting: Hematology and Oncology

## 2017-07-14 ENCOUNTER — Inpatient Hospital Stay: Payer: Medicare Other

## 2017-07-14 ENCOUNTER — Other Ambulatory Visit: Payer: Self-pay

## 2017-07-14 VITALS — BP 136/73 | HR 76 | Temp 97.8°F | Resp 18 | Wt 149.1 lb

## 2017-07-14 DIAGNOSIS — Z7189 Other specified counseling: Secondary | ICD-10-CM

## 2017-07-14 DIAGNOSIS — Z171 Estrogen receptor negative status [ER-]: Secondary | ICD-10-CM | POA: Diagnosis not present

## 2017-07-14 DIAGNOSIS — C50911 Malignant neoplasm of unspecified site of right female breast: Secondary | ICD-10-CM

## 2017-07-14 DIAGNOSIS — Z006 Encounter for examination for normal comparison and control in clinical research program: Secondary | ICD-10-CM | POA: Diagnosis not present

## 2017-07-14 DIAGNOSIS — C50211 Malignant neoplasm of upper-inner quadrant of right female breast: Secondary | ICD-10-CM

## 2017-07-14 DIAGNOSIS — R05 Cough: Secondary | ICD-10-CM | POA: Diagnosis not present

## 2017-07-14 DIAGNOSIS — E118 Type 2 diabetes mellitus with unspecified complications: Secondary | ICD-10-CM

## 2017-07-14 DIAGNOSIS — R5383 Other fatigue: Secondary | ICD-10-CM | POA: Diagnosis not present

## 2017-07-14 DIAGNOSIS — R062 Wheezing: Secondary | ICD-10-CM

## 2017-07-14 LAB — CBC WITH DIFFERENTIAL/PLATELET
Basophils Absolute: 0.2 10*3/uL — ABNORMAL HIGH (ref 0–0.1)
Basophils Relative: 3 %
Eosinophils Absolute: 0 10*3/uL (ref 0–0.7)
Eosinophils Relative: 1 %
HCT: 37.1 % (ref 35.0–47.0)
Hemoglobin: 12.9 g/dL (ref 12.0–16.0)
Lymphocytes Relative: 16 %
Lymphs Abs: 0.9 10*3/uL — ABNORMAL LOW (ref 1.0–3.6)
MCH: 31.2 pg (ref 26.0–34.0)
MCHC: 34.7 g/dL (ref 32.0–36.0)
MCV: 89.7 fL (ref 80.0–100.0)
Monocytes Absolute: 0.9 10*3/uL (ref 0.2–0.9)
Monocytes Relative: 16 %
Neutro Abs: 3.7 10*3/uL (ref 1.4–6.5)
Neutrophils Relative %: 64 %
Platelets: 258 10*3/uL (ref 150–440)
RBC: 4.14 MIL/uL (ref 3.80–5.20)
RDW: 14.3 % (ref 11.5–14.5)
WBC: 5.7 10*3/uL (ref 3.6–11.0)

## 2017-07-14 LAB — COMPREHENSIVE METABOLIC PANEL
ALT: 26 U/L (ref 14–54)
AST: 25 U/L (ref 15–41)
Albumin: 4.1 g/dL (ref 3.5–5.0)
Alkaline Phosphatase: 49 U/L (ref 38–126)
Anion gap: 9 (ref 5–15)
BUN: 9 mg/dL (ref 6–20)
CO2: 25 mmol/L (ref 22–32)
Calcium: 9.1 mg/dL (ref 8.9–10.3)
Chloride: 102 mmol/L (ref 101–111)
Creatinine, Ser: 0.76 mg/dL (ref 0.44–1.00)
GFR calc Af Amer: >60 mL/min (ref >60)
GFR calc non Af Amer: >60 mL/min (ref >60)
Glucose, Bld: 140 mg/dL — ABNORMAL HIGH (ref 65–99)
Potassium: 3.9 mmol/L (ref 3.5–5.1)
Sodium: 136 mmol/L (ref 135–145)
Total Bilirubin: 0.7 mg/dL (ref 0.3–1.2)
Total Protein: 6.7 g/dL (ref 6.5–8.1)

## 2017-07-14 LAB — HEMOGLOBIN A1C
Hgb A1c MFr Bld: 5.9 % — ABNORMAL HIGH (ref 4.8–5.6)
Mean Plasma Glucose: 122.63 mg/dL

## 2017-07-14 LAB — MAGNESIUM: Magnesium: 2 mg/dL (ref 1.7–2.4)

## 2017-07-14 MED ORDER — HEPARIN SOD (PORK) LOCK FLUSH 100 UNIT/ML IV SOLN
500.0000 [IU] | Freq: Once | INTRAVENOUS | Status: AC
  Administered 2017-07-14: 500 [IU] via INTRAVENOUS

## 2017-07-14 MED ORDER — SODIUM CHLORIDE 0.9% FLUSH
10.0000 mL | INTRAVENOUS | Status: DC | PRN
  Administered 2017-07-14: 10 mL via INTRAVENOUS
  Filled 2017-07-14: qty 10

## 2017-07-14 NOTE — Progress Notes (Signed)
Here for follow up. Stated in pt on 3/10-had fever ( 3/7 ) ,and dx w flu and spent 3 days in the hospital. Stated runny nose and cough intermittent -but overall feels better. Wearing mask this am.

## 2017-07-14 NOTE — Progress Notes (Signed)
Stephanie Sanders returns to clinic this morning for consideration of Cycle 2 chemotherapy with AC. Patient reports she still has symptoms from her recent bout with the flu including cough and fatigue. She was hospitalized from 06/28/17 - 07/01/17 with influenza A as well as malaise, fatigue and cough along with hyponatremia, neutropenia and thrombocytopenia. None of these symptoms or her hospitalization were related to study participation, but were largely related to the flu. She has a persistent cough and states she does not feel well enough to receive her 2nd cycle of AC. Dr. Mike Gip in to examine patient and states she will reassess her in one week for her second cycle of chemotherapy. Study labs for the [id number] UPBEAT study were collected this morning as scheduled, and patient verifies that she was fasting more than 3 hours prior to blood collection. Patient completed the DCP-001 consent and questionnaire while in clinic. Adverse events with grade and attribution this cycle as follows:  Adverse Event Log  Study/Protocol: [id number] Cycle: 1 month Event Grade Onset Date Resolved Date Drug Name Attribution Treatment Comments  Influenza A 3 06/28/17 07/07/17  ?Chemo: AC Tamiflu Patient hospitalized  Malaise 2 06/24/17   Chemo: AC    Fatigue 2 06/24/17   Chemo: AC    Neutropenia 4 06/30/17 07/07/17  Chemo: AC Levaquin Patient received Neulasta the day after chemo  Fever 1 06/25/17 06/29/17  Flu  Positive for Influenza A  Chills 1 06/25/17 06/26/17  Flu    Tachycardia 1 06/25/17 06/28/17  Flu    Decreased platelets 2 1 07/01/17 06/28/17 07/06/17 07/01/17  Chemo: AC  This is Nadir value  Hyponatremia 1 06/25/17 06/30/17  Chemo: AC    Cough 1 06/25/17   Flu  Ongoing  Nausea 1 06/25/17 07/01/17  Chemo + Flu    Yolande Jolly, BSN, MHA, OCN 07/14/2017 9:55 AM

## 2017-07-14 NOTE — Progress Notes (Signed)
[facility] day:  07/14/2017   Chief Complaint: Stephanie Sanders is a 72 y.o. female with multi-focal right breast cancer who is seen for 1 week assessment and consideration of cycle #2 AC.  HPI:  The patient was last seen in the medical oncology clinic by me on 07/07/2017.  At that time, she was seen for assessment after interval hospitalization for fever and neutropenia and influenza A.  She had completed a course of Tamiflu.  She was fatigued.  She was slowing recovering.  She had a residual cough.  Exam revealed soft crackles at the bases on deep inspiration and intermittent left sided wheezes.  CXR revealed no acute infiltrate and small bilateral effusions.  During the interm, patient is doing "about 80% better", an improvement from "50% normal" last week. Patient continues to have a cough and rhinorrhea. She denies head congestion and fever. Patient experiencing general malaise. She denies nausea, vomiting, and diarrhea. Patient notes that her appetite is improving, however she "does not always feel hungry". Weight is up 1 pound.   Patient s/p a cyst removal on her lower back. Sutures in place.  Patient does not verbalize breast concerns today.    Past Medical History:  Diagnosis Date  . Cancer ([place])   . Diabetes mellitus without complication ([place])   . Diabetic nephropathy ([place])   . Headache    migraines  . Heart murmur    ASYMPTOMATIC  . Hypertension   . Hypothyroidism   . Seborrheic keratosis   . Vitamin D deficiency     Past Surgical History:  Procedure Laterality Date  . APPENDECTOMY    . BREAST BIOPSY Right 05/28/2017   right breast bx 3 areas invasive mamm ca lymph node mets  . BREAST CYST ASPIRATION Bilateral    neg  . CHOLECYSTECTOMY    . COLONOSCOPY WITH PROPOFOL N/A 01/01/2015   Procedure: COLONOSCOPY WITH PROPOFOL;  Surgeon: Josefine Class, MD;  Location: [facility] ENDOSCOPY;  Service: Endoscopy;  Laterality: N/A;  .  EYE SURGERY     eyelid  . PORTACATH PLACEMENT Right 06/10/2017   Procedure: INSERTION PORT-A-CATH;  Surgeon: Herbert Pun, MD;  Location: [facility] ORS;  Service: General;  Laterality: Right;  . STENT PLACEMENT ILIAC ([place] HX)    . TUBAL LIGATION      Family History  Problem Relation Age of Onset  . Cancer Mother   . Cancer Maternal Aunt   . Breast cancer Neg Hx     Social History:  reports that she quit smoking about 45 years ago. Her smoking use included cigarettes. She has a 20.00 pack-year smoking history. She has never used smokeless tobacco. She reports that she does not drink alcohol or use drugs.  She has 3 children (daughters: age 54 and 46; son: age 21).  She is retired.  She worked for the [employer].  Her husband has dementia.  She lives in [place].  The patient is alone today.  Allergies:  Allergies  Allergen Reactions  . Fiorinal [Butalbital-Aspirin-Caffeine] Nausea And Vomiting and Other (See Comments)    SEVERE HEADACHE  . Sulfa Antibiotics Other (See Comments)    Burning esophagus and stomach  . Tramadol Nausea And Vomiting  . Other Rash    STERI-STRIPS-RASH STERI-STRIPS-RASH    Current Medications: Current Outpatient Medications  Medication Sig Dispense Refill  . acetaminophen (TYLENOL) 500 MG tablet Take 1,000 mg by mouth every 6 (six) hours as needed (for headaches.).    Marland Kitchen  atorvastatin (LIPITOR) 10 MG tablet Take 10 mg by mouth every evening.     . Cholecalciferol 2000 UNITS CAPS Take 4,000 Units by mouth daily.     . colesevelam (WELCHOL) 625 MG tablet Take 1,875 mg by mouth every evening.     Marland Kitchen guaiFENesin-dextromethorphan (ROBITUSSIN DM) 100-10 MG/5ML syrup Take 5 mLs by mouth every 4 (four) hours as needed for cough. 118 mL 0  . levothyroxine (SYNTHROID, LEVOTHROID) 50 MCG tablet Take 50 mcg by mouth daily before breakfast.    . lidocaine-prilocaine (EMLA) cream Apply 1 application topically as needed. 30 g 6  . metFORMIN (GLUCOPHAGE) 1000 MG tablet  Take 1,000 mg by mouth 2 (two) times daily.    . quinapril (ACCUPRIL) 5 MG tablet Take 5 mg by mouth every morning.     . sodium bicarbonate 650 MG tablet Take 1,300 mg by mouth at bedtime.    Marland Kitchen albuterol (PROVENTIL HFA;VENTOLIN HFA) 108 (90 Base) MCG/ACT inhaler Inhale 2 puffs into the lungs every 6 (six) hours as needed for wheezing or shortness of breath. (Patient not taking: Reported on 07/14/2017) 1 Inhaler 0  . aspirin EC 81 MG tablet Take 81 mg by mouth daily.    Marland Kitchen aspirin-acetaminophen-caffeine (EXCEDRIN MIGRAINE) 250-250-65 MG tablet Take 2 tablets by mouth every 6 (six) hours as needed for headache.    . dexamethasone (DECADRON) 4 MG tablet Take 2 tablets by mouth once a day on the day after chemotherapy and then take 2 tablets two times a day for 2 days. Take with food. (Patient not taking: Reported on 07/14/2017) 30 tablet 1  . LORazepam (ATIVAN) 0.5 MG tablet Take 1 tablet (0.5 mg total) by mouth every 6 (six) hours as needed (Nausea or vomiting). (Patient not taking: Reported on 07/14/2017) 30 tablet 0  . Melatonin 5 MG TABS Take 5 mg by mouth at bedtime as needed (for sleep.).    Marland Kitchen ondansetron (ZOFRAN) 8 MG tablet Take 1 tablet (8 mg total) by mouth 2 (two) times daily as needed. Start on the third day after chemotherapy. (Patient not taking: Reported on 07/14/2017) 30 tablet 1   No current facility-administered medications for this visit.    Facility-Administered Medications Ordered in Other Visits  Medication Dose Route Frequency Provider Last Rate Last Dose  . heparin lock flush 100 unit/mL  500 Units Intravenous Once Corcoran, Melissa C, MD      . sodium chloride flush (NS) 0.9 % injection 10 mL  10 mL Intravenous PRN Lequita Asal, MD   10 mL at 07/14/17 0843    Review of Systems:  GENERAL:  Feels "some better".  Not a lot of energy.  No fevers or sweats.  Weight up 1 pounds (baseline 153-155 pounds).  PERFORMANCE STATUS (ECOG):  2 HEENT:  Runny nose.  No visual changes,  sore throat, mouth sores or tenderness. Lungs: No shortness of breath.  Cough.  No hemoptysis. Cardiac:  No chest pain, palpitations, orthopnea, or PND. GI:  Appetite 75%, improving.  Slight nausea.  No vomiting, constipation, melena or hematochezia.  Takes Welchol s/p cholecystectomy. GU:  No urgency, frequency, dysuria, or hematuria. Musculoskeletal:  No back pain.  No joint pain.  No muscle tenderness. Extremities:  No pain or swelling. Skin:  s/p  cyst removal to lower back; sutures in place.  No rashes or skin changes. Neuro:  No headache, numbness or weakness, balance or coordination issues. Endocrine:  No diabetes, thyroid issues, hot flashes or night sweats. Psych:  Stress.  No depression or anxiety.  Melatonin for sleep. Pain:  4/10 - generalized. Review of systems:  All other systems reviewed and found to be negative.  Physical Exam: Blood pressure 136/73, pulse 76, temperature 97.8 F (36.6 C), temperature source Tympanic, resp. rate 18, weight 149 lb 1.6 oz (67.6 kg). GENERAL:  Well developed, well nourished, woman sitting comfortably in the exam room in no acute distress. MENTAL STATUS:  Alert and oriented to person, place and time. HEAD:  Wearing a black cap with strands of hair visible.  Normocephalic, atraumatic, face symmetric, no Cushingoid features. EYES:  Gold rimmed glasses.  Blue eyes.  Pupils equal round and reactive to light and accomodation.  No conjunctivitis or scleral icterus. ENT:  Oropharynx clear without lesion.  Tongue normal. Mucous membranes moist.  RESPIRATORY:  Soft intermittent wheeze on the right.  No rales or rhonchi. CARDIOVASCULAR:  Regular rate and rhythm without murmur, rub or gallop. CHEST WALL:  Port-a-cath site unremarkable. ABDOMEN:  Soft, non-tender with active bowel sounds and no hepatosplenomegaly.  No masses. SKIN:  No rashes, ulcers or lesions. EXTREMITIES: No edema, no skin discoloration or tenderness.  No palpable cords. LYMPH NODES:  No palpable cervical, supraclavicular, or inguinal adenopathy  NEUROLOGICAL: Unremarkable. PSYCH:  Appropriate.   Infusion on 07/14/2017  Component Date Value Ref Range Status  . Magnesium 07/14/2017 2.0  1.7 - 2.4 mg/dL Final   Performed at [facility], [address], [place], [place] [address]  . Sodium 07/14/2017 136  135 - 145 mmol/L Final  . Potassium 07/14/2017 3.9  3.5 - 5.1 mmol/L Final  . Chloride 07/14/2017 102  101 - 111 mmol/L Final  . CO2 07/14/2017 25  22 - 32 mmol/L Final  . Glucose, Bld 07/14/2017 140* 65 - 99 mg/dL Final  . BUN 07/14/2017 9  6 - 20 mg/dL Final  . Creatinine, Ser 07/14/2017 0.76  0.44 - 1.00 mg/dL Final  . Calcium 07/14/2017 9.1  8.9 - 10.3 mg/dL Final  . Total Protein 07/14/2017 6.7  6.5 - 8.1 g/dL Final  . Albumin 07/14/2017 4.1  3.5 - 5.0 g/dL Final  . AST 07/14/2017 25  15 - 41 U/L Final  . ALT 07/14/2017 26  14 - 54 U/L Final  . Alkaline Phosphatase 07/14/2017 49  38 - 126 U/L Final  . Total Bilirubin 07/14/2017 0.7  0.3 - 1.2 mg/dL Final  . GFR calc non Af Amer 07/14/2017 >60  >60 mL/min Final  . GFR calc Af Amer 07/14/2017 >60  >60 mL/min Final   Comment: (NOTE) The eGFR has been calculated using the CKD EPI equation. This calculation has not been validated in all clinical situations. eGFR's persistently <60 mL/min signify possible Chronic Kidney Disease.   Georgiann Hahn gap 07/14/2017 9  5 - 15 Final   Performed at [facility], [address] [place], [place] [address]  . WBC 07/14/2017 5.7  3.6 - 11.0 K/uL Final  . RBC 07/14/2017 4.14  3.80 - 5.20 MIL/uL Final  . Hemoglobin 07/14/2017 12.9  12.0 - 16.0 g/dL Final  . HCT 07/14/2017 37.1  35.0 - 47.0 % Final  . MCV 07/14/2017 89.7  80.0 - 100.0 fL Final  . MCH 07/14/2017 31.2  26.0 - 34.0 pg Final  . MCHC 07/14/2017 34.7  32.0 - 36.0 g/dL Final  . RDW 07/14/2017 14.3  11.5 - 14.5 % Final  . Platelets 07/14/2017 258  150 - 440 K/uL Final  . Neutrophils Relative % 07/14/2017  64  % Final  . Neutro Abs 07/14/2017 3.7  1.4 - 6.5 K/uL Final  . Lymphocytes Relative 07/14/2017 16  % Final  . Lymphs Abs 07/14/2017 0.9* 1.0 - 3.6 K/uL Final  . Monocytes Relative 07/14/2017 16  % Final  . Monocytes Absolute 07/14/2017 0.9  0.2 - 0.9 K/uL Final  . Eosinophils Relative 07/14/2017 1  % Final  . Eosinophils Absolute 07/14/2017 0.0  0 - 0.7 K/uL Final  . Basophils Relative 07/14/2017 3  % Final  . Basophils Absolute 07/14/2017 0.2* 0 - 0.1 K/uL Final   Performed at [facility], [address] [place], [place] [address]    Assessment:  Stephanie Sanders is a 72 y.o. 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  Pathology revealed two grade II invasive mammary carcinomas of no special type (4 cm from the nipple and 5 cm from the nipple).  Tumors were in close proximity.  Lymph node biopsy confirmed metastatic carcinoma of breast origin.  Tumor is ER negative (< 1%), PR negative and Her 2/neu 2+ by IHC.  Right sided mammogram and ultrasound on 05/21/2017 revealed 2 highly suspicious masses in the right breast at the 10:30 position 4 cm from nipple (1.8 x 1.2 x 1.1 cm) and at the 10:30 position 5 cm from nipple (1.1 x 0.9 x 0.9 cm).  There was a suspicious 2.7 x 1 x 2.2 cm lymph node in the right axilla with asymmetric nodular cortical thickening.  Ultrasound guided biopsy of the 2 masses and lymph node on 05/28/2017 revealed grade II invasive mammary carcinoma of no special type (4 cm from the nipple and 5 cm from the nipple).  Lymph node biopsy revealed metastatic carcinoma of breast origin.  Tumor is ER negative (< 1%), PR negative and Her 2/neu 2+ by IHC.  FISH was negative.  CA 27.29 was 16.5 and CA15-3 was 17.6 on 06/04/2017.  Echo on 06/09/2017 revealed an EF of 60-65%.  She has enrolled on the UPBEAT clinical trial.  She is day 22 s/p cycle #1 AC (06/23/2017) with Udencya support.  Cycle #1 was complicated  by influenza A.  Nadir counts included an ANC of 0 and platelet count of 71,000.  She was admitted to [facility] from 06/28/2017 - 07/01/2017 with with fever, cough and generalized weakness.  Nasal swab was + for influenza A.  She completed a course of Tamiflu.  Symptomatically, she is fatigued. She has a non-productive cough. She is slowing recovering.  Exam  intermittent RIGHT sided wheezes.  Plan: 1.  Labs today:  CBC with diff, CMP, Mg, and [facility] genetic risk panel.  2.  Discuss plans for reinitiation of chemotherapy in 1 week based on recovery from influenza A. 3.  RTC in 1 week for MD assessment, labs (CBC with diff, CMP, Mg), and +/- cycle #2 AC.    Honor Loh, NP 07/14/2017,10:00 AM   I saw and evaluated the patient, participating in the key portions of the service and reviewing pertinent diagnostic studies and records.  I reviewed the nurse practitioner's note and agree with the findings and the plan.  The assessment and plan were discussed with the patient.  Several questions were asked by the patient and answered.   Nolon Stalls, MD 07/14/2017,10:00 AM

## 2017-07-21 ENCOUNTER — Encounter: Payer: Self-pay | Admitting: Hematology and Oncology

## 2017-07-21 ENCOUNTER — Inpatient Hospital Stay (HOSPITAL_BASED_OUTPATIENT_CLINIC_OR_DEPARTMENT_OTHER): Payer: Medicare Other | Admitting: Hematology and Oncology

## 2017-07-21 ENCOUNTER — Inpatient Hospital Stay: Payer: Medicare Other

## 2017-07-21 ENCOUNTER — Inpatient Hospital Stay: Payer: Medicare Other | Attending: Hematology and Oncology

## 2017-07-21 VITALS — BP 154/64 | HR 97 | Temp 97.7°F | Resp 20 | Wt 148.1 lb

## 2017-07-21 DIAGNOSIS — C50411 Malignant neoplasm of upper-outer quadrant of right female breast: Secondary | ICD-10-CM

## 2017-07-21 DIAGNOSIS — E119 Type 2 diabetes mellitus without complications: Secondary | ICD-10-CM | POA: Insufficient documentation

## 2017-07-21 DIAGNOSIS — Z87891 Personal history of nicotine dependence: Secondary | ICD-10-CM | POA: Insufficient documentation

## 2017-07-21 DIAGNOSIS — L03312 Cellulitis of back [any part except buttock]: Secondary | ICD-10-CM

## 2017-07-21 DIAGNOSIS — C50911 Malignant neoplasm of unspecified site of right female breast: Secondary | ICD-10-CM

## 2017-07-21 DIAGNOSIS — J029 Acute pharyngitis, unspecified: Secondary | ICD-10-CM | POA: Insufficient documentation

## 2017-07-21 DIAGNOSIS — R531 Weakness: Secondary | ICD-10-CM | POA: Diagnosis not present

## 2017-07-21 DIAGNOSIS — R5383 Other fatigue: Secondary | ICD-10-CM | POA: Diagnosis not present

## 2017-07-21 DIAGNOSIS — E876 Hypokalemia: Secondary | ICD-10-CM | POA: Insufficient documentation

## 2017-07-21 DIAGNOSIS — C773 Secondary and unspecified malignant neoplasm of axilla and upper limb lymph nodes: Secondary | ICD-10-CM

## 2017-07-21 DIAGNOSIS — L278 Dermatitis due to other substances taken internally: Secondary | ICD-10-CM | POA: Insufficient documentation

## 2017-07-21 DIAGNOSIS — B370 Candidal stomatitis: Secondary | ICD-10-CM | POA: Diagnosis not present

## 2017-07-21 DIAGNOSIS — K5900 Constipation, unspecified: Secondary | ICD-10-CM | POA: Diagnosis not present

## 2017-07-21 DIAGNOSIS — Z5111 Encounter for antineoplastic chemotherapy: Secondary | ICD-10-CM | POA: Diagnosis present

## 2017-07-21 DIAGNOSIS — Z5189 Encounter for other specified aftercare: Secondary | ICD-10-CM | POA: Diagnosis not present

## 2017-07-21 DIAGNOSIS — Z171 Estrogen receptor negative status [ER-]: Secondary | ICD-10-CM | POA: Insufficient documentation

## 2017-07-21 DIAGNOSIS — R070 Pain in throat: Secondary | ICD-10-CM | POA: Insufficient documentation

## 2017-07-21 DIAGNOSIS — L03818 Cellulitis of other sites: Secondary | ICD-10-CM

## 2017-07-21 DIAGNOSIS — N898 Other specified noninflammatory disorders of vagina: Secondary | ICD-10-CM | POA: Insufficient documentation

## 2017-07-21 DIAGNOSIS — I10 Essential (primary) hypertension: Secondary | ICD-10-CM | POA: Insufficient documentation

## 2017-07-21 DIAGNOSIS — R110 Nausea: Secondary | ICD-10-CM | POA: Insufficient documentation

## 2017-07-21 DIAGNOSIS — R42 Dizziness and giddiness: Secondary | ICD-10-CM | POA: Insufficient documentation

## 2017-07-21 DIAGNOSIS — R300 Dysuria: Secondary | ICD-10-CM | POA: Diagnosis not present

## 2017-07-21 DIAGNOSIS — Z7189 Other specified counseling: Secondary | ICD-10-CM

## 2017-07-21 LAB — CBC WITH DIFFERENTIAL/PLATELET
Basophils Absolute: 0.1 10*3/uL (ref 0–0.1)
Basophils Relative: 3 %
Eosinophils Absolute: 0.2 10*3/uL (ref 0–0.7)
Eosinophils Relative: 4 %
HCT: 37.1 % (ref 35.0–47.0)
Hemoglobin: 12.9 g/dL (ref 12.0–16.0)
Lymphocytes Relative: 16 %
Lymphs Abs: 0.7 10*3/uL — ABNORMAL LOW (ref 1.0–3.6)
MCH: 31.4 pg (ref 26.0–34.0)
MCHC: 34.8 g/dL (ref 32.0–36.0)
MCV: 90.1 fL (ref 80.0–100.0)
Monocytes Absolute: 0.5 10*3/uL (ref 0.2–0.9)
Monocytes Relative: 13 %
Neutro Abs: 2.7 10*3/uL (ref 1.4–6.5)
Neutrophils Relative %: 64 %
Platelets: 174 10*3/uL (ref 150–440)
RBC: 4.12 MIL/uL (ref 3.80–5.20)
RDW: 15.3 % — ABNORMAL HIGH (ref 11.5–14.5)
WBC: 4.2 10*3/uL (ref 3.6–11.0)

## 2017-07-21 LAB — COMPREHENSIVE METABOLIC PANEL
ALT: 24 U/L (ref 14–54)
AST: 28 U/L (ref 15–41)
Albumin: 4.1 g/dL (ref 3.5–5.0)
Alkaline Phosphatase: 38 U/L (ref 38–126)
Anion gap: 10 (ref 5–15)
BUN: 10 mg/dL (ref 6–20)
CO2: 24 mmol/L (ref 22–32)
Calcium: 9.4 mg/dL (ref 8.9–10.3)
Chloride: 102 mmol/L (ref 101–111)
Creatinine, Ser: 0.77 mg/dL (ref 0.44–1.00)
GFR calc Af Amer: >60 mL/min (ref >60)
GFR calc non Af Amer: >60 mL/min (ref >60)
Glucose, Bld: 161 mg/dL — ABNORMAL HIGH (ref 65–99)
Potassium: 3.4 mmol/L — ABNORMAL LOW (ref 3.5–5.1)
Sodium: 136 mmol/L (ref 135–145)
Total Bilirubin: 1.2 mg/dL (ref 0.3–1.2)
Total Protein: 6.8 g/dL (ref 6.5–8.1)

## 2017-07-21 LAB — MAGNESIUM: Magnesium: 1.7 mg/dL (ref 1.7–2.4)

## 2017-07-21 MED ORDER — HEPARIN SOD (PORK) LOCK FLUSH 100 UNIT/ML IV SOLN
INTRAVENOUS | Status: AC
  Filled 2017-07-21: qty 5

## 2017-07-21 MED ORDER — HEPARIN SOD (PORK) LOCK FLUSH 100 UNIT/ML IV SOLN
500.0000 [IU] | Freq: Once | INTRAVENOUS | Status: AC
  Administered 2017-07-21: 500 [IU] via INTRAVENOUS

## 2017-07-21 NOTE — Progress Notes (Signed)
Patient states her appetite is still not normal but is improving.

## 2017-07-21 NOTE — Progress Notes (Signed)
[facility] day:  07/21/2017   Chief Complaint: Stephanie Sanders is a 72 y.o. female with multi-focal right breast cancer who is seen for 1 week assessment and consideration of cycle #2 AC.  HPI:  The patient was last seen in the medical oncology clinic by me on 07/14/2017.  At that time, she was slowly recovering from a recent bout with the flu.  Decision was made to postpone chemotherapy.  During the interim, patient is doing "a lot better". She is not having coughing spells despite "a lot of phlegm" in her throat. Patient is able to take stairs without difficulty. Patient states, "I can barely feel it in my chest". Last week she was "80% normal", but notes that she has continued to improve overall. Patient denies recurrent fevers.   Patient currently on a 10 day course of Doxycycline for a lesion that was removed on her back. Pathology revealed an infected epidermoid cyst with surrounding cellulitis.   Patient has no B symptoms. She verbalizes no concerns with regards to her breasts. Patient is eating well. Her weight is down 1 pound. Patient denies pain in the clinic today.    Past Medical History:  Diagnosis Date  . Cancer ([place])   . Diabetes mellitus without complication ([place])   . Diabetic nephropathy ([place])   . Headache    migraines  . Heart murmur    ASYMPTOMATIC  . Hypertension   . Hypothyroidism   . Seborrheic keratosis   . Vitamin D deficiency     Past Surgical History:  Procedure Laterality Date  . APPENDECTOMY    . BREAST BIOPSY Right 05/28/2017   right breast bx 3 areas invasive mamm ca lymph node mets  . BREAST CYST ASPIRATION Bilateral    neg  . CHOLECYSTECTOMY    . COLONOSCOPY WITH PROPOFOL N/A 01/01/2015   Procedure: COLONOSCOPY WITH PROPOFOL;  Surgeon: Josefine Class, MD;  Location: [facility] ENDOSCOPY;  Service: Endoscopy;  Laterality: N/A;  . EYE SURGERY     eyelid  . PORTACATH PLACEMENT Right 06/10/2017    Procedure: INSERTION PORT-A-CATH;  Surgeon: Herbert Pun, MD;  Location: [facility] ORS;  Service: General;  Laterality: Right;  . STENT PLACEMENT ILIAC ([place] [facility])    . TUBAL LIGATION      Family History  Problem Relation Age of Onset  . Cancer Mother   . Cancer Maternal Aunt   . Breast cancer Neg Hx     Social History:  reports that she quit smoking about 45 years ago. Her smoking use included cigarettes. She has a 20.00 pack-year smoking history. She has never used smokeless tobacco. She reports that she does not drink alcohol or use drugs.  She has 3 children (daughters: age 51 and 42; son: age 68).  She is retired.  She worked for the [employer].  Her husband has dementia.  She lives in [place].  The patient is alone today.  Allergies:  Allergies  Allergen Reactions  . Fiorinal [Butalbital-Aspirin-Caffeine] Nausea And Vomiting and Other (See Comments)    SEVERE HEADACHE  . Sulfa Antibiotics Other (See Comments)    Burning esophagus and stomach  . Tramadol Nausea And Vomiting  . Other Rash    STERI-STRIPS-RASH STERI-STRIPS-RASH    Current Medications: Current Outpatient Medications  Medication Sig Dispense Refill  . acetaminophen (TYLENOL) 500 MG tablet Take 1,000 mg by mouth every 6 (six) hours as needed (for headaches.).    Marland Kitchen aspirin-acetaminophen-caffeine ([place]) [id number]  MG tablet Take 2 tablets by mouth every 6 (six) hours as needed for headache.    Marland Kitchen atorvastatin (LIPITOR) 10 MG tablet Take 10 mg by mouth every evening.     . Cholecalciferol 2000 UNITS CAPS Take 4,000 Units by mouth daily.     . colesevelam (WELCHOL) 625 MG tablet Take 1,875 mg by mouth every evening.     Marland Kitchen doxycycline (VIBRA-TABS) 100 MG tablet Take 100 mg by mouth 2 (two) times daily.    Marland Kitchen guaiFENesin-dextromethorphan (ROBITUSSIN DM) 100-10 MG/5ML syrup Take 5 mLs by mouth every 4 (four) hours as needed for cough. 118 mL 0  . levothyroxine (SYNTHROID, LEVOTHROID) 50 MCG tablet Take 50 mcg  by mouth daily before breakfast.    . lidocaine-prilocaine (EMLA) cream Apply 1 application topically as needed. 30 g 6  . Melatonin 5 MG TABS Take 5 mg by mouth at bedtime as needed (for sleep.).    Marland Kitchen metFORMIN (GLUCOPHAGE) 1000 MG tablet Take 1,000 mg by mouth 2 (two) times daily.    . ondansetron (ZOFRAN) 8 MG tablet Take 1 tablet (8 mg total) by mouth 2 (two) times daily as needed. Start on the third day after chemotherapy. 30 tablet 1  . quinapril (ACCUPRIL) 5 MG tablet Take 5 mg by mouth every morning.     . sodium bicarbonate 650 MG tablet Take 1,300 mg by mouth at bedtime.    Marland Kitchen albuterol (PROVENTIL HFA;VENTOLIN HFA) 108 (90 Base) MCG/ACT inhaler Inhale 2 puffs into the lungs every 6 (six) hours as needed for wheezing or shortness of breath. (Patient not taking: Reported on 07/14/2017) 1 Inhaler 0  . aspirin EC 81 MG tablet Take 81 mg by mouth daily.    Marland Kitchen dexamethasone (DECADRON) 4 MG tablet Take 2 tablets by mouth once a day on the day after chemotherapy and then take 2 tablets two times a day for 2 days. Take with food. (Patient not taking: Reported on 07/14/2017) 30 tablet 1  . LORazepam (ATIVAN) 0.5 MG tablet Take 1 tablet (0.5 mg total) by mouth every 6 (six) hours as needed (Nausea or vomiting). (Patient not taking: Reported on 07/14/2017) 30 tablet 0   No current facility-administered medications for this visit.     Review of Systems:  GENERAL:  Feels  "better".  Not a lot of energy.  No fevers or sweats.  Weight down 1 pound (baseline 153-155 pounds).  PERFORMANCE STATUS (ECOG):  2 HEENT:  Runny nose.  Clear phlegm.  No visual changes, sore throat, mouth sores or tenderness. Lungs: No shortness of breath.  Cough; improved.  No hemoptysis. Cardiac:  No chest pain, palpitations, orthopnea, or PND. GI:  Appetite 75%, improving.  Slight nausea.  No vomiting, constipation, melena or hematochezia.  Takes Welchol s/p cholecystectomy. GU:  No urgency, frequency, dysuria, or  hematuria. Musculoskeletal:  No back pain.  No joint pain.  No muscle tenderness. Extremities:  No pain or swelling. Skin:  s/p  cyst removal to lower back; sutures in place.  No rashes or skin changes. Neuro:  No headache, numbness or weakness, balance or coordination issues. Endocrine:  No diabetes, thyroid issues, hot flashes or night sweats. Psych:  Stress.  No depression or anxiety.  Melatonin for sleep. Pain:  No focal pain. Review of systems:  All other systems reviewed and found to be negative.  Physical Exam: Blood pressure (!) 154/64, pulse 97, temperature 97.7 F (36.5 C), temperature source Tympanic, resp. rate 20, weight 148 lb 2 oz (67.2  kg). GENERAL:  Well developed, well nourished, woman sitting comfortably in the exam room in no acute distress. MENTAL STATUS:  Alert and oriented to person, place and time. HEAD:  Wearing a light blue cap.  Normocephalic, atraumatic, face symmetric, no Cushingoid features. EYES:  Gold rimmed glasses.  Blue eyes.  Pupils equal round and reactive to light and accomodation.  No conjunctivitis or scleral icterus. ENT:  Oropharynx clear without lesion.  Tongue normal. Mucous membranes moist.  RESPIRATORY:  Soft intermittent wheeze on the right.  No rales or rhonchi. CARDIOVASCULAR:  Regular rate and rhythm without murmur, rub or gallop. CHEST WALL:  Port-a-cath site unremarkable. ABDOMEN:  Soft, non-tender with active bowel sounds and no hepatosplenomegaly.  No masses. SKIN:  s/p epidermoid cyst removal to lower back; 2.5 cm area of erythema with 1 cm area of centralized infection. No rashes, ulcers or lesions. EXTREMITIES: No edema, no skin discoloration or tenderness.  No palpable cords. LYMPH NODES: No palpable cervical, supraclavicular, or inguinal adenopathy  NEUROLOGICAL: Unremarkable. PSYCH:  Appropriate.   Appointment on 07/21/2017  Component Date Value Ref Range Status  . Magnesium 07/21/2017 1.7  1.7 - 2.4 mg/dL Final   Performed at  [facility], [address], [place], [place] [address]  . Sodium 07/21/2017 136  135 - 145 mmol/L Final  . Potassium 07/21/2017 3.4* 3.5 - 5.1 mmol/L Final  . Chloride 07/21/2017 102  101 - 111 mmol/L Final  . CO2 07/21/2017 24  22 - 32 mmol/L Final  . Glucose, Bld 07/21/2017 161* 65 - 99 mg/dL Final  . BUN 07/21/2017 10  6 - 20 mg/dL Final  . Creatinine, Ser 07/21/2017 0.77  0.44 - 1.00 mg/dL Final  . Calcium 07/21/2017 9.4  8.9 - 10.3 mg/dL Final  . Total Protein 07/21/2017 6.8  6.5 - 8.1 g/dL Final  . Albumin 07/21/2017 4.1  3.5 - 5.0 g/dL Final  . AST 07/21/2017 28  15 - 41 U/L Final  . ALT 07/21/2017 24  14 - 54 U/L Final  . Alkaline Phosphatase 07/21/2017 38  38 - 126 U/L Final  . Total Bilirubin 07/21/2017 1.2  0.3 - 1.2 mg/dL Final  . GFR calc non Af Amer 07/21/2017 >60  >60 mL/min Final  . GFR calc Af Amer 07/21/2017 >60  >60 mL/min Final   Comment: (NOTE) The eGFR has been calculated using the CKD EPI equation. This calculation has not been validated in all clinical situations. eGFR's persistently <60 mL/min signify possible Chronic Kidney Disease.   Georgiann Hahn gap 07/21/2017 10  5 - 15 Final   Performed at [facility], [address] [place], [place] [address]  . WBC 07/21/2017 4.2  3.6 - 11.0 K/uL Final  . RBC 07/21/2017 4.12  3.80 - 5.20 MIL/uL Final  . Hemoglobin 07/21/2017 12.9  12.0 - 16.0 g/dL Final  . HCT 07/21/2017 37.1  35.0 - 47.0 % Final  . MCV 07/21/2017 90.1  80.0 - 100.0 fL Final  . MCH 07/21/2017 31.4  26.0 - 34.0 pg Final  . MCHC 07/21/2017 34.8  32.0 - 36.0 g/dL Final  . RDW 07/21/2017 15.3* 11.5 - 14.5 % Final  . Platelets 07/21/2017 174  150 - 440 K/uL Final  . Neutrophils Relative % 07/21/2017 64  % Final  . Neutro Abs 07/21/2017 2.7  1.4 - 6.5 K/uL Final  . Lymphocytes Relative 07/21/2017 16  % Final  . Lymphs Abs 07/21/2017 0.7* 1.0 - 3.6 K/uL Final  . Monocytes  Relative 07/21/2017 13  % Final  . Monocytes Absolute 07/21/2017 0.5   0.2 - 0.9 K/uL Final  . Eosinophils Relative 07/21/2017 4  % Final  . Eosinophils Absolute 07/21/2017 0.2  0 - 0.7 K/uL Final  . Basophils Relative 07/21/2017 3  % Final  . Basophils Absolute 07/21/2017 0.1  0 - 0.1 K/uL Final   Performed at [facility], [address] [place], [place] [address]    Assessment:  STASHIA SIA is a 72 y.o. 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  Pathology revealed two grade II invasive mammary carcinomas of no special type (4 cm from the nipple and 5 cm from the nipple).  Tumors were in close proximity.  Lymph node biopsy confirmed metastatic carcinoma of breast origin.  Tumor is ER negative (< 1%), PR negative and Her 2/neu 2+ by IHC.  Right sided mammogram and ultrasound on 05/21/2017 revealed 2 highly suspicious masses in the right breast at the 10:30 position 4 cm from nipple (1.8 x 1.2 x 1.1 cm) and at the 10:30 position 5 cm from nipple (1.1 x 0.9 x 0.9 cm).  There was a suspicious 2.7 x 1 x 2.2 cm lymph node in the right axilla with asymmetric nodular cortical thickening.  Ultrasound guided biopsy of the 2 masses and lymph node on 05/28/2017 revealed grade II invasive mammary carcinoma of no special type (4 cm from the nipple and 5 cm from the nipple).  Lymph node biopsy revealed metastatic carcinoma of breast origin.  Tumor is ER negative (< 1%), PR negative and Her 2/neu 2+ by IHC.  FISH was negative.  CA 27.29 was 16.5 and CA15-3 was 17.6 on 06/04/2017.  Echo on 06/09/2017 revealed an EF of 60-65%.  She has enrolled on the UPBEAT clinical trial.  She is day 73 s/p cycle #1 AC (06/23/2017) with Udencya support.  Cycle #1 was complicated by influenza A.  Nadir counts included an ANC of 0 and platelet count of 71,000.  She was admitted to [facility] from 06/28/2017 - 07/01/2017 with with fever, cough and generalized weakness.  Nasal swab was + for influenza A.  She completed a course of  Tamiflu.  Symptomatically, she is fatigued. She has an improving non-productive cough. She is slowing recovering. She feels "better" overall.  Exam demonstrates site of recent excisional cyst removal with surrounding cellulitis. Patient is on Doxycycline. Labs unremarkable.   Plan: 1.  Labs today:  CBC with diff, CMP, Mg.  2.  Discuss chemotherapy treatment. Patient is s/p infected epidermoid cyst removal to her back. Cellulitic changes noted.  Patient on Doxycycline. Discuss plans for reinitiation of chemotherapy in 1 week once cyst site has improved/healed.  3.  RTC in 1 week for MD assessment, labs (CBC with diff, CMP, Mg), and +/- cycle #2 AC.    Honor Loh, NP 07/21/2017,9:11 AM   I saw and evaluated the patient, participating in the key portions of the service and reviewing pertinent diagnostic studies and records.  I reviewed the nurse practitioner's note and agree with the findings and the plan.  The assessment and plan were discussed with the patient.  Several questions were asked by the patient and answered.   Nolon Stalls, MD 07/21/2017,9:11 AM

## 2017-07-26 NOTE — Progress Notes (Signed)
[facility] day:  07/28/2017   Chief Complaint: Stephanie Sanders is a 72 y.o. female with multi-focal right breast cancer who is seen for 1 week assessment and consideration of cycle #2 AC.  HPI:  The patient was last seen in the medical oncology clinic on 07/21/2017.  At that time, continued to recover from recent influenza. She had some residual cough and mucus production, but to recurrent fevers. She was on a 10 day course of doxycycline following recent epidermoid cyst removal from her back and subsequent infection.  Labs were stable. Cycle # 2 AC was held to allow for further healing of biopsy site.   Symptomatically, patient is doing "better". She denies any residual shortness or breath or cough. No fevers. Site to her back from where she had cyst removed is healing well. Culture grew "skin flora".  She mis-spoke last visit, her Doxycycline was prescribed for 14 days. She continues on the medication as prescribed. Patient feels "so much better".   Patient denies any B symptoms. She is eating well. Weight is down 1 pound.   Patient denies pain in the clinic today.    Past Medical History:  Diagnosis Date  . Cancer ([place])   . Diabetes mellitus without complication ([place])   . Diabetic nephropathy ([place])   . Headache    migraines  . Heart murmur    ASYMPTOMATIC  . Hypertension   . Hypothyroidism   . Seborrheic keratosis   . Vitamin D deficiency     Past Surgical History:  Procedure Laterality Date  . APPENDECTOMY    . BREAST BIOPSY Right 05/28/2017   right breast bx 3 areas invasive mamm ca lymph node mets  . BREAST CYST ASPIRATION Bilateral    neg  . CHOLECYSTECTOMY    . COLONOSCOPY WITH PROPOFOL N/A 01/01/2015   Procedure: COLONOSCOPY WITH PROPOFOL;  Surgeon: Josefine Class, MD;  Location: [facility] ENDOSCOPY;  Service: Endoscopy;  Laterality: N/A;  . EYE SURGERY     eyelid  . PORTACATH PLACEMENT Right 06/10/2017   Procedure:  INSERTION PORT-A-CATH;  Surgeon: Herbert Pun, MD;  Location: [facility] ORS;  Service: General;  Laterality: Right;  . STENT PLACEMENT ILIAC ([place] HX)    . TUBAL LIGATION      Family History  Problem Relation Age of Onset  . Cancer Mother   . Cancer Maternal Aunt   . Breast cancer Neg Hx     Social History:  reports that she quit smoking about 45 years ago. Her smoking use included cigarettes. She has a 20.00 pack-year smoking history. She has never used smokeless tobacco. She reports that she does not drink alcohol or use drugs.  She has 3 children (daughters: age 51 and 29; son: age 63).  She is retired.  She worked for the [employer].  Her husband has dementia.  She lives in [place].  The patient is alone today.  Allergies:  Allergies  Allergen Reactions  . Fiorinal [Butalbital-Aspirin-Caffeine] Nausea And Vomiting and Other (See Comments)    SEVERE HEADACHE  . Sulfa Antibiotics Other (See Comments)    Burning esophagus and stomach  . Tramadol Nausea And Vomiting  . Other Rash    STERI-STRIPS-RASH STERI-STRIPS-RASH    Current Medications: Current Outpatient Medications  Medication Sig Dispense Refill  . atorvastatin (LIPITOR) 10 MG tablet Take 10 mg by mouth every evening.     . Cholecalciferol 2000 UNITS CAPS Take 4,000 Units by mouth daily.     Marland Kitchen  colesevelam (WELCHOL) 625 MG tablet Take 1,875 mg by mouth every evening.     Marland Kitchen doxycycline (VIBRA-TABS) 100 MG tablet Take 100 mg by mouth 2 (two) times daily.    Marland Kitchen levothyroxine (SYNTHROID, LEVOTHROID) 50 MCG tablet Take 50 mcg by mouth daily before breakfast.    . lidocaine-prilocaine (EMLA) cream Apply 1 application topically as needed. 30 g 6  . metFORMIN (GLUCOPHAGE) 1000 MG tablet Take 1,000 mg by mouth 2 (two) times daily.    . quinapril (ACCUPRIL) 5 MG tablet Take 5 mg by mouth every morning.     . sodium bicarbonate 650 MG tablet Take 1,300 mg by mouth at bedtime.    Marland Kitchen acetaminophen (TYLENOL) 500 MG tablet Take 1,000 mg  by mouth every 6 (six) hours as needed (for headaches.).    Marland Kitchen aspirin EC 81 MG tablet Take 81 mg by mouth daily.    Marland Kitchen aspirin-acetaminophen-caffeine (EXCEDRIN MIGRAINE) 250-250-65 MG tablet Take 2 tablets by mouth every 6 (six) hours as needed for headache.    . dexamethasone (DECADRON) 4 MG tablet Take 2 tablets by mouth once a day on the day after chemotherapy and then take 2 tablets two times a day for 2 days. Take with food. (Patient not taking: Reported on 07/14/2017) 30 tablet 1  . LORazepam (ATIVAN) 0.5 MG tablet Take 1 tablet (0.5 mg total) by mouth every 6 (six) hours as needed (Nausea or vomiting). (Patient not taking: Reported on 07/14/2017) 30 tablet 0  . Melatonin 5 MG TABS Take 5 mg by mouth at bedtime as needed (for sleep.).    Marland Kitchen ondansetron (ZOFRAN) 8 MG tablet Take 1 tablet (8 mg total) by mouth 2 (two) times daily as needed. Start on the third day after chemotherapy. (Patient not taking: Reported on 07/28/2017) 30 tablet 1   No current facility-administered medications for this visit.    Facility-Administered Medications Ordered in Other Visits  Medication Dose Route Frequency Provider Last Rate Last Dose  . heparin lock flush 100 unit/mL  500 Units Intracatheter PRN Lequita Asal, MD        Review of Systems:  GENERAL:  Feels  "so much better".  No fevers or sweats.  Weight down 1 pound (baseline 153-155 pounds).  PERFORMANCE STATUS (ECOG):  2 HEENT:  No visual changes, sore throat, mouth sores or tenderness. Lungs: No shortness of breath.  Cough; improved.  Little phlegm.  No hemoptysis. Cardiac:  No chest pain, palpitations, orthopnea, or PND. GI:  Eating well.  No vomiting, vomiting, constipation, melena or hematochezia.  Takes Welchol s/p cholecystectomy. GU:  No urgency, frequency, dysuria, or hematuria. Musculoskeletal:  No back pain.  No joint pain.  No muscle tenderness. Extremities:  No pain or swelling. Skin:  s/p  cyst removal to lower back, healing.  No  rashes or skin changes. Neuro:  No headache, numbness or weakness, balance or coordination issues. Endocrine:  No diabetes, thyroid issues, hot flashes or night sweats. Psych:  Stress.  No depression or anxiety.  Melatonin for sleep. Pain:  No focal pain.  Review of systems:  All other systems reviewed and found to be negative.  Physical Exam: Blood pressure (!) 150/82, pulse 89, temperature (!) 97.3 F (36.3 C), temperature source Tympanic, resp. rate 18, weight 147 lb 9.6 oz (67 kg). GENERAL:  Well developed, well nourished, woman sitting comfortably in the exam room in no acute distress. MENTAL STATUS:  Alert and oriented to person, place and time. HEAD:  Light silver wig.  Normocephalic, atraumatic, face symmetric, no Cushingoid features. EYES:  Gold rimmed glasses.  Blue eyes.  Pupils equal round and reactive to light and accomodation.  No conjunctivitis or scleral icterus. ENT:  Oropharynx clear without lesion.  Tongue normal. Mucous membranes moist.  RESPIRATORY:  Soft intermittent wheeze on the right.  No rales or rhonchi. CARDIOVASCULAR:  Regular rate and rhythm without murmur, rub or gallop. CHEST WALL:  Port-a-cath site unremarkable. ABDOMEN:  Soft, non-tender with active bowel sounds and no hepatosplenomegaly.  No masses. SKIN:  s/p epidermoid cyst removal to lower back.  Erythema resolved.  Slightly ruddy.  Central eschar.  No increased warmth or tenderness. EXTREMITIES: No edema, no skin discoloration or tenderness.  No palpable cords. LYMPH NODES: No palpable cervical, supraclavicular, or inguinal adenopathy  NEUROLOGICAL: Unremarkable. PSYCH:  Appropriate.   Clinical Support on 07/28/2017  Component Date Value Ref Range Status  . Magnesium 07/28/2017 1.4* 1.7 - 2.4 mg/dL Final   Performed at [facility], [address] [place], [place] [address]  . Sodium 07/28/2017 137  135 - 145 mmol/L Final  . Potassium 07/28/2017 3.4* 3.5 - 5.1 mmol/L Final  . Chloride  07/28/2017 102  101 - 111 mmol/L Final  . CO2 07/28/2017 24  22 - 32 mmol/L Final  . Glucose, Bld 07/28/2017 178* 65 - 99 mg/dL Final  . BUN 07/28/2017 13  6 - 20 mg/dL Final  . Creatinine, Ser 07/28/2017 0.83  0.44 - 1.00 mg/dL Final  . Calcium 07/28/2017 9.4  8.9 - 10.3 mg/dL Final  . Total Protein 07/28/2017 6.7  6.5 - 8.1 g/dL Final  . Albumin 07/28/2017 4.3  3.5 - 5.0 g/dL Final  . AST 07/28/2017 27  15 - 41 U/L Final  . ALT 07/28/2017 24  14 - 54 U/L Final  . Alkaline Phosphatase 07/28/2017 35* 38 - 126 U/L Final  . Total Bilirubin 07/28/2017 1.3* 0.3 - 1.2 mg/dL Final  . GFR calc non Af Amer 07/28/2017 >60  >60 mL/min Final  . GFR calc Af Amer 07/28/2017 >60  >60 mL/min Final   Comment: (NOTE) The eGFR has been calculated using the CKD EPI equation. This calculation has not been validated in all clinical situations. eGFR's persistently <60 mL/min signify possible Chronic Kidney Disease.   Georgiann Hahn gap 07/28/2017 11  5 - 15 Final   Performed at [facility], [address] [place], [place] [address]  . WBC 07/28/2017 4.7  3.6 - 11.0 K/uL Final  . RBC 07/28/2017 4.09  3.80 - 5.20 MIL/uL Final  . Hemoglobin 07/28/2017 12.9  12.0 - 16.0 g/dL Final  . HCT 07/28/2017 36.8  35.0 - 47.0 % Final  . MCV 07/28/2017 90.1  80.0 - 100.0 fL Final  . MCH 07/28/2017 31.5  26.0 - 34.0 pg Final  . MCHC 07/28/2017 34.9  32.0 - 36.0 g/dL Final  . RDW 07/28/2017 15.9* 11.5 - 14.5 % Final  . Platelets 07/28/2017 130* 150 - 440 K/uL Final  . Neutrophils Relative % 07/28/2017 61  % Final  . Neutro Abs 07/28/2017 2.9  1.4 - 6.5 K/uL Final  . Lymphocytes Relative 07/28/2017 19  % Final  . Lymphs Abs 07/28/2017 0.9* 1.0 - 3.6 K/uL Final  . Monocytes Relative 07/28/2017 11  % Final  . Monocytes Absolute 07/28/2017 0.5  0.2 - 0.9 K/uL Final  . Eosinophils Relative 07/28/2017 8  % Final  . Eosinophils Absolute 07/28/2017 0.4  0 - 0.7 K/uL Final  . Basophils  Relative 07/28/2017 1  % Final  .  Basophils Absolute 07/28/2017 0.1  0 - 0.1 K/uL Final   Performed at [facility], [address] [place], [place] [address]    Assessment:  FRANCISCO EYERLY is a 72 y.o. 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  Pathology revealed two grade II invasive mammary carcinomas of no special type (4 cm from the nipple and 5 cm from the nipple).  Tumors were in close proximity.  Lymph node biopsy confirmed metastatic carcinoma of breast origin.  Tumor is ER negative (< 1%), PR negative and Her 2/neu 2+ by IHC.  [facility] genetic testing on 07/21/2017 revealed a variant of uncertain significance in RECQL4.  Right sided mammogram and ultrasound on 05/21/2017 revealed 2 highly suspicious masses in the right breast at the 10:30 position 4 cm from nipple (1.8 x 1.2 x 1.1 cm) and at the 10:30 position 5 cm from nipple (1.1 x 0.9 x 0.9 cm).  There was a suspicious 2.7 x 1 x 2.2 cm lymph node in the right axilla with asymmetric nodular cortical thickening.  Ultrasound guided biopsy of the 2 masses and lymph node on 05/28/2017 revealed grade II invasive mammary carcinoma of no special type (4 cm from the nipple and 5 cm from the nipple).  Lymph node biopsy revealed metastatic carcinoma of breast origin.  Tumor is ER negative (< 1%), PR negative and Her 2/neu 2+ by IHC.  FISH was negative.  CA 27.29 was 16.5 and CA15-3 was 17.6 on 06/04/2017.  Echo on 06/09/2017 revealed an EF of 60-65%.  She has enrolled on the UPBEAT clinical trial.  She is s/p cycle #1 AC (06/23/2017) with Udencya support.  Cycle #1 was complicated by influenza A.  Nadir counts included an ANC of 0 and platelet count of 71,000. Cycle #2 postponed due to slow recovery from influenza and excisional cyst removal with surrounding cellulitis (treated with 10 days of doxycycline).   She was admitted to [facility] from 06/28/2017 - 07/01/2017 with with fever, cough and generalized weakness.   Nasal swab was + for influenza A.  She completed a course of Tamiflu.  Symptomatically, she is feeling better.  Exam demonstrates site of recent excisional cyst removal is healing well with no signs of infection. Patient continues on Doxycycline. WBC 4700 with an ANC of 2900. Potassium 3.4 (low). Total bilirubin elevated at 1.3 (direct 0.2).  Magnesium is 1.4 (low).   Plan: 1.  Labs today:  CBC with diff, CMP, Mg.  2.  Labs reviewed. Blood counts stable and adequate enough for treatment, however total bilirubin elevated.  Will proceed with cycle #2 AC today as planned.  3.  Discuss low magnesium. Magnesium 1.4  today. Will replace with 2 grams intravenous magnesium today. Will start oral magnesium glycinate. Will recheck next week. 4.  Discuss low potassium. Potassium level 3.4 today. Patient to increase dietary potassium intake. Will recheck next week.   5.  Continue Doxycycline as previously prescribed for cellulitis.  6.  RTC in 1 week for labs (CBC with diff, BMP, Mg) 7.  RTC on 07/29/2017 for Udenyca. 8.  RTC on 08/11/2017 for MD assessment, labs (CBC with diff, CMP, Mg), and +/- cycle #3 AC.  Addendum:  Pharmacy suggested dose reduction of adriamycin secondary to slightly elevated bilirubin (> 1.2).  Bilirubin is indirect.  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  Will postpone chemotherapy.  Repeat LFTs tomorrow and proceed with chemotherapy tomorrow if  bilirubin normal.  Bilirubin was 1.1 on 07/29/2017.   Honor Loh, NP 07/28/2017,8:59 AM   I saw and evaluated the patient, participating in the key portions of the service and reviewing pertinent diagnostic studies and records.  I reviewed the nurse practitioner's note and agree with the findings and the plan.  The assessment and plan were discussed with the patient.  Multiple questions were asked by the patient and answered.   Nolon Stalls, MD 07/28/2017,8:59 AM

## 2017-07-28 ENCOUNTER — Encounter: Payer: Self-pay | Admitting: Hematology and Oncology

## 2017-07-28 ENCOUNTER — Other Ambulatory Visit: Payer: Self-pay

## 2017-07-28 ENCOUNTER — Inpatient Hospital Stay: Payer: Medicare Other

## 2017-07-28 ENCOUNTER — Inpatient Hospital Stay (HOSPITAL_BASED_OUTPATIENT_CLINIC_OR_DEPARTMENT_OTHER): Payer: Medicare Other | Admitting: Hematology and Oncology

## 2017-07-28 ENCOUNTER — Inpatient Hospital Stay: Payer: Medicare Other | Admitting: *Deleted

## 2017-07-28 VITALS — BP 150/82 | HR 89 | Temp 97.3°F | Resp 18 | Wt 147.6 lb

## 2017-07-28 DIAGNOSIS — Z171 Estrogen receptor negative status [ER-]: Secondary | ICD-10-CM

## 2017-07-28 DIAGNOSIS — C773 Secondary and unspecified malignant neoplasm of axilla and upper limb lymph nodes: Secondary | ICD-10-CM

## 2017-07-28 DIAGNOSIS — R17 Unspecified jaundice: Secondary | ICD-10-CM | POA: Diagnosis not present

## 2017-07-28 DIAGNOSIS — L03312 Cellulitis of back [any part except buttock]: Secondary | ICD-10-CM | POA: Diagnosis not present

## 2017-07-28 DIAGNOSIS — C50411 Malignant neoplasm of upper-outer quadrant of right female breast: Secondary | ICD-10-CM

## 2017-07-28 DIAGNOSIS — C50911 Malignant neoplasm of unspecified site of right female breast: Secondary | ICD-10-CM

## 2017-07-28 DIAGNOSIS — E876 Hypokalemia: Secondary | ICD-10-CM

## 2017-07-28 DIAGNOSIS — Z5111 Encounter for antineoplastic chemotherapy: Secondary | ICD-10-CM

## 2017-07-28 DIAGNOSIS — Z95828 Presence of other vascular implants and grafts: Secondary | ICD-10-CM

## 2017-07-28 LAB — COMPREHENSIVE METABOLIC PANEL
ALT: 24 U/L (ref 14–54)
AST: 27 U/L (ref 15–41)
Albumin: 4.3 g/dL (ref 3.5–5.0)
Alkaline Phosphatase: 35 U/L — ABNORMAL LOW (ref 38–126)
Anion gap: 11 (ref 5–15)
BUN: 13 mg/dL (ref 6–20)
CO2: 24 mmol/L (ref 22–32)
Calcium: 9.4 mg/dL (ref 8.9–10.3)
Chloride: 102 mmol/L (ref 101–111)
Creatinine, Ser: 0.83 mg/dL (ref 0.44–1.00)
GFR calc Af Amer: >60 mL/min (ref >60)
GFR calc non Af Amer: >60 mL/min (ref >60)
Glucose, Bld: 178 mg/dL — ABNORMAL HIGH (ref 65–99)
Potassium: 3.4 mmol/L — ABNORMAL LOW (ref 3.5–5.1)
Sodium: 137 mmol/L (ref 135–145)
Total Bilirubin: 1.3 mg/dL — ABNORMAL HIGH (ref 0.3–1.2)
Total Protein: 6.7 g/dL (ref 6.5–8.1)

## 2017-07-28 LAB — CBC WITH DIFFERENTIAL/PLATELET
Basophils Absolute: 0.1 10*3/uL (ref 0–0.1)
Basophils Relative: 1 %
Eosinophils Absolute: 0.4 10*3/uL (ref 0–0.7)
Eosinophils Relative: 8 %
HCT: 36.8 % (ref 35.0–47.0)
Hemoglobin: 12.9 g/dL (ref 12.0–16.0)
Lymphocytes Relative: 19 %
Lymphs Abs: 0.9 10*3/uL — ABNORMAL LOW (ref 1.0–3.6)
MCH: 31.5 pg (ref 26.0–34.0)
MCHC: 34.9 g/dL (ref 32.0–36.0)
MCV: 90.1 fL (ref 80.0–100.0)
Monocytes Absolute: 0.5 10*3/uL (ref 0.2–0.9)
Monocytes Relative: 11 %
Neutro Abs: 2.9 10*3/uL (ref 1.4–6.5)
Neutrophils Relative %: 61 %
Platelets: 130 10*3/uL — ABNORMAL LOW (ref 150–440)
RBC: 4.09 MIL/uL (ref 3.80–5.20)
RDW: 15.9 % — ABNORMAL HIGH (ref 11.5–14.5)
WBC: 4.7 10*3/uL (ref 3.6–11.0)

## 2017-07-28 LAB — MAGNESIUM: Magnesium: 1.4 mg/dL — ABNORMAL LOW (ref 1.7–2.4)

## 2017-07-28 LAB — BILIRUBIN, DIRECT: Bilirubin, Direct: 0.2 mg/dL (ref 0.1–0.5)

## 2017-07-28 MED ORDER — SODIUM CHLORIDE 0.9 % IV SOLN
Freq: Once | INTRAVENOUS | Status: AC
  Administered 2017-07-28: 09:00:00 via INTRAVENOUS
  Filled 2017-07-28: qty 1000

## 2017-07-28 MED ORDER — PALONOSETRON HCL INJECTION 0.25 MG/5ML
0.2500 mg | Freq: Once | INTRAVENOUS | Status: DC
  Filled 2017-07-28: qty 5

## 2017-07-28 MED ORDER — SODIUM CHLORIDE 0.9 % IV SOLN: 600.0000 mg/m2 | Freq: Once | INTRAVENOUS | Status: DC

## 2017-07-28 MED ORDER — SODIUM CHLORIDE 0.9% FLUSH
10.0000 mL | INTRAVENOUS | Status: AC | PRN
  Administered 2017-07-28: 10 mL
  Filled 2017-07-28: qty 10

## 2017-07-28 MED ORDER — HEPARIN SOD (PORK) LOCK FLUSH 100 UNIT/ML IV SOLN: 500.0000 [IU] | INTRAVENOUS | Status: DC | PRN

## 2017-07-28 MED ORDER — SODIUM CHLORIDE 0.9 % IV SOLN
Freq: Once | INTRAVENOUS | Status: DC
  Filled 2017-07-28 (×2): qty 5

## 2017-07-28 MED ORDER — MAGNESIUM SULFATE 2 GM/50ML IV SOLN
2.0000 g | Freq: Once | INTRAVENOUS | Status: AC
  Administered 2017-07-28: 2 g via INTRAVENOUS
  Filled 2017-07-28: qty 50

## 2017-07-28 MED ORDER — SODIUM CHLORIDE 0.9% FLUSH
10.0000 mL | INTRAVENOUS | Status: DC | PRN
  Administered 2017-07-28: 10 mL
  Filled 2017-07-28: qty 10

## 2017-07-28 MED ORDER — DOXORUBICIN HCL CHEMO IV INJECTION 2 MG/ML: 60.0000 mg/m2 | Freq: Once | INTRAVENOUS | Status: DC

## 2017-07-28 MED ORDER — HEPARIN SOD (PORK) LOCK FLUSH 100 UNIT/ML IV SOLN
500.0000 [IU] | Freq: Once | INTRAVENOUS | Status: AC | PRN
  Administered 2017-07-28: 500 [IU]
  Filled 2017-07-28: qty 5

## 2017-07-28 NOTE — Progress Notes (Signed)
Per Honor Loh NP pt to receive 2 grams Magnesium today. Pt to return to clinic for lab check and possible treatment.

## 2017-07-28 NOTE — Progress Notes (Signed)
Here for follow up. Feeling better she stated  Stated cyst removed from lower back has healed -feeling " itchy" she stated . No voice c/o this am

## 2017-07-29 ENCOUNTER — Other Ambulatory Visit: Payer: Self-pay | Admitting: *Deleted

## 2017-07-29 ENCOUNTER — Inpatient Hospital Stay: Payer: Medicare Other

## 2017-07-29 ENCOUNTER — Other Ambulatory Visit: Payer: Self-pay | Admitting: Hematology and Oncology

## 2017-07-29 VITALS — BP 132/76 | HR 79 | Temp 98.9°F | Resp 20

## 2017-07-29 DIAGNOSIS — Z5111 Encounter for antineoplastic chemotherapy: Secondary | ICD-10-CM | POA: Diagnosis not present

## 2017-07-29 DIAGNOSIS — C50211 Malignant neoplasm of upper-inner quadrant of right female breast: Secondary | ICD-10-CM

## 2017-07-29 DIAGNOSIS — Z171 Estrogen receptor negative status [ER-]: Principal | ICD-10-CM

## 2017-07-29 DIAGNOSIS — C50911 Malignant neoplasm of unspecified site of right female breast: Secondary | ICD-10-CM

## 2017-07-29 DIAGNOSIS — R17 Unspecified jaundice: Secondary | ICD-10-CM

## 2017-07-29 LAB — CBC WITH DIFFERENTIAL/PLATELET
Basophils Absolute: 0.1 10*3/uL (ref 0–0.1)
Basophils Relative: 3 %
Eosinophils Absolute: 0.5 10*3/uL (ref 0–0.7)
Eosinophils Relative: 11 %
HCT: 37.6 % (ref 35.0–47.0)
Hemoglobin: 13.1 g/dL (ref 12.0–16.0)
Lymphocytes Relative: 23 %
Lymphs Abs: 1.1 10*3/uL (ref 1.0–3.6)
MCH: 31.5 pg (ref 26.0–34.0)
MCHC: 34.8 g/dL (ref 32.0–36.0)
MCV: 90.6 fL (ref 80.0–100.0)
Monocytes Absolute: 0.4 10*3/uL (ref 0.2–0.9)
Monocytes Relative: 9 %
Neutro Abs: 2.6 10*3/uL (ref 1.4–6.5)
Neutrophils Relative %: 54 %
Platelets: 139 10*3/uL — ABNORMAL LOW (ref 150–440)
RBC: 4.16 MIL/uL (ref 3.80–5.20)
RDW: 15.2 % — ABNORMAL HIGH (ref 11.5–14.5)
WBC: 4.8 10*3/uL (ref 3.6–11.0)

## 2017-07-29 LAB — COMPREHENSIVE METABOLIC PANEL
ALT: 24 U/L (ref 14–54)
AST: 27 U/L (ref 15–41)
Albumin: 4.1 g/dL (ref 3.5–5.0)
Alkaline Phosphatase: 42 U/L (ref 38–126)
Anion gap: 10 (ref 5–15)
BUN: 13 mg/dL (ref 6–20)
CO2: 22 mmol/L (ref 22–32)
Calcium: 9 mg/dL (ref 8.9–10.3)
Chloride: 104 mmol/L (ref 101–111)
Creatinine, Ser: 0.93 mg/dL (ref 0.44–1.00)
GFR calc Af Amer: >60 mL/min (ref >60)
GFR calc non Af Amer: 60 mL/min — ABNORMAL LOW (ref >60)
Glucose, Bld: 135 mg/dL — ABNORMAL HIGH (ref 65–99)
Potassium: 3.6 mmol/L (ref 3.5–5.1)
Sodium: 136 mmol/L (ref 135–145)
Total Bilirubin: 1.1 mg/dL (ref 0.3–1.2)
Total Protein: 6.5 g/dL (ref 6.5–8.1)

## 2017-07-29 LAB — MAGNESIUM: Magnesium: 1.9 mg/dL (ref 1.7–2.4)

## 2017-07-29 MED ORDER — HEPARIN SOD (PORK) LOCK FLUSH 100 UNIT/ML IV SOLN
500.0000 [IU] | Freq: Once | INTRAVENOUS | Status: AC | PRN
  Administered 2017-07-29: 500 [IU]
  Filled 2017-07-29: qty 5

## 2017-07-29 MED ORDER — FOSAPREPITANT DIMEGLUMINE INJECTION 150 MG
Freq: Once | INTRAVENOUS | Status: AC
  Administered 2017-07-29: 13:00:00 via INTRAVENOUS
  Filled 2017-07-29: qty 5

## 2017-07-29 MED ORDER — SODIUM CHLORIDE 0.9% FLUSH
10.0000 mL | INTRAVENOUS | Status: DC | PRN
  Administered 2017-07-29: 10 mL
  Filled 2017-07-29: qty 10

## 2017-07-29 MED ORDER — SODIUM CHLORIDE 0.9 % IV SOLN
600.0000 mg/m2 | Freq: Once | INTRAVENOUS | Status: AC
  Administered 2017-07-29: 1100 mg via INTRAVENOUS
  Filled 2017-07-29: qty 50

## 2017-07-29 MED ORDER — SODIUM CHLORIDE 0.9 % IV SOLN
Freq: Once | INTRAVENOUS | Status: AC
  Administered 2017-07-29: 12:00:00 via INTRAVENOUS
  Filled 2017-07-29: qty 1000

## 2017-07-29 MED ORDER — DOXORUBICIN HCL CHEMO IV INJECTION 2 MG/ML
60.0000 mg/m2 | Freq: Once | INTRAVENOUS | Status: AC
  Administered 2017-07-29: 110 mg via INTRAVENOUS
  Filled 2017-07-29: qty 55

## 2017-07-29 MED ORDER — PALONOSETRON HCL INJECTION 0.25 MG/5ML
0.2500 mg | Freq: Once | INTRAVENOUS | Status: AC
  Administered 2017-07-29: 0.25 mg via INTRAVENOUS
  Filled 2017-07-29: qty 5

## 2017-07-29 NOTE — Progress Notes (Signed)
07/29/17 lab work reviewed with Honor Loh, NP. Per NP order: proceed with scheduled treatment today.

## 2017-07-30 ENCOUNTER — Inpatient Hospital Stay: Payer: Medicare Other

## 2017-07-30 DIAGNOSIS — Z171 Estrogen receptor negative status [ER-]: Principal | ICD-10-CM

## 2017-07-30 DIAGNOSIS — Z5111 Encounter for antineoplastic chemotherapy: Secondary | ICD-10-CM | POA: Diagnosis not present

## 2017-07-30 DIAGNOSIS — C50911 Malignant neoplasm of unspecified site of right female breast: Secondary | ICD-10-CM

## 2017-07-30 MED ORDER — PEGFILGRASTIM-CBQV 6 MG/0.6ML ~~LOC~~ SOSY
6.0000 mg | PREFILLED_SYRINGE | Freq: Once | SUBCUTANEOUS | Status: AC
  Administered 2017-07-30: 6 mg via SUBCUTANEOUS
  Filled 2017-07-30: qty 0.6

## 2017-08-04 ENCOUNTER — Encounter: Payer: Self-pay | Admitting: Oncology

## 2017-08-04 ENCOUNTER — Telehealth: Payer: Self-pay | Admitting: *Deleted

## 2017-08-04 ENCOUNTER — Inpatient Hospital Stay (HOSPITAL_BASED_OUTPATIENT_CLINIC_OR_DEPARTMENT_OTHER): Payer: Medicare Other | Admitting: Oncology

## 2017-08-04 ENCOUNTER — Other Ambulatory Visit: Payer: Self-pay | Admitting: *Deleted

## 2017-08-04 ENCOUNTER — Inpatient Hospital Stay: Payer: Medicare Other

## 2017-08-04 VITALS — BP 132/84 | HR 81 | Temp 98.6°F | Resp 18 | Wt 145.0 lb

## 2017-08-04 DIAGNOSIS — R42 Dizziness and giddiness: Secondary | ICD-10-CM

## 2017-08-04 DIAGNOSIS — R300 Dysuria: Secondary | ICD-10-CM

## 2017-08-04 DIAGNOSIS — J029 Acute pharyngitis, unspecified: Secondary | ICD-10-CM | POA: Diagnosis not present

## 2017-08-04 DIAGNOSIS — Z171 Estrogen receptor negative status [ER-]: Principal | ICD-10-CM

## 2017-08-04 DIAGNOSIS — Z5111 Encounter for antineoplastic chemotherapy: Secondary | ICD-10-CM | POA: Diagnosis not present

## 2017-08-04 DIAGNOSIS — I10 Essential (primary) hypertension: Secondary | ICD-10-CM

## 2017-08-04 DIAGNOSIS — C50211 Malignant neoplasm of upper-inner quadrant of right female breast: Secondary | ICD-10-CM

## 2017-08-04 DIAGNOSIS — N898 Other specified noninflammatory disorders of vagina: Secondary | ICD-10-CM | POA: Diagnosis not present

## 2017-08-04 DIAGNOSIS — Z87891 Personal history of nicotine dependence: Secondary | ICD-10-CM

## 2017-08-04 DIAGNOSIS — R070 Pain in throat: Secondary | ICD-10-CM

## 2017-08-04 DIAGNOSIS — B9789 Other viral agents as the cause of diseases classified elsewhere: Secondary | ICD-10-CM

## 2017-08-04 DIAGNOSIS — R5383 Other fatigue: Secondary | ICD-10-CM | POA: Diagnosis not present

## 2017-08-04 DIAGNOSIS — J028 Acute pharyngitis due to other specified organisms: Secondary | ICD-10-CM

## 2017-08-04 DIAGNOSIS — K5900 Constipation, unspecified: Secondary | ICD-10-CM

## 2017-08-04 DIAGNOSIS — C50911 Malignant neoplasm of unspecified site of right female breast: Secondary | ICD-10-CM

## 2017-08-04 DIAGNOSIS — B370 Candidal stomatitis: Secondary | ICD-10-CM

## 2017-08-04 DIAGNOSIS — C50411 Malignant neoplasm of upper-outer quadrant of right female breast: Secondary | ICD-10-CM

## 2017-08-04 DIAGNOSIS — E119 Type 2 diabetes mellitus without complications: Secondary | ICD-10-CM | POA: Diagnosis not present

## 2017-08-04 LAB — CBC WITH DIFFERENTIAL/PLATELET
Basophils Absolute: 0 10*3/uL (ref 0–0.1)
Basophils Relative: 1 %
Eosinophils Absolute: 0.1 10*3/uL (ref 0–0.7)
Eosinophils Relative: 6 %
HCT: 36.4 % (ref 35.0–47.0)
Hemoglobin: 12.6 g/dL (ref 12.0–16.0)
Lymphocytes Relative: 18 %
Lymphs Abs: 0.4 10*3/uL — ABNORMAL LOW (ref 1.0–3.6)
MCH: 31.3 pg (ref 26.0–34.0)
MCHC: 34.5 g/dL (ref 32.0–36.0)
MCV: 90.7 fL (ref 80.0–100.0)
Monocytes Absolute: 0.1 10*3/uL — ABNORMAL LOW (ref 0.2–0.9)
Monocytes Relative: 3 %
Neutro Abs: 1.5 10*3/uL (ref 1.4–6.5)
Neutrophils Relative %: 72 %
Platelets: 77 10*3/uL — ABNORMAL LOW (ref 150–440)
RBC: 4.02 MIL/uL (ref 3.80–5.20)
RDW: 15.4 % — ABNORMAL HIGH (ref 11.5–14.5)
WBC: 2.1 10*3/uL — ABNORMAL LOW (ref 3.6–11.0)

## 2017-08-04 LAB — COMPREHENSIVE METABOLIC PANEL
ALT: 17 U/L (ref 14–54)
AST: 21 U/L (ref 15–41)
Albumin: 3.7 g/dL (ref 3.5–5.0)
Alkaline Phosphatase: 59 U/L (ref 38–126)
Anion gap: 9 (ref 5–15)
BUN: 13 mg/dL (ref 6–20)
CO2: 27 mmol/L (ref 22–32)
Calcium: 9.3 mg/dL (ref 8.9–10.3)
Chloride: 98 mmol/L — ABNORMAL LOW (ref 101–111)
Creatinine, Ser: 0.79 mg/dL (ref 0.44–1.00)
GFR calc Af Amer: >60 mL/min (ref >60)
GFR calc non Af Amer: >60 mL/min (ref >60)
Glucose, Bld: 177 mg/dL — ABNORMAL HIGH (ref 65–99)
Potassium: 3.7 mmol/L (ref 3.5–5.1)
Sodium: 134 mmol/L — ABNORMAL LOW (ref 135–145)
Total Bilirubin: 0.9 mg/dL (ref 0.3–1.2)
Total Protein: 6.2 g/dL — ABNORMAL LOW (ref 6.5–8.1)

## 2017-08-04 LAB — URINALYSIS, COMPLETE (UACMP) WITH MICROSCOPIC
Bacteria, UA: NONE SEEN
Bilirubin Urine: NEGATIVE
GLUCOSE, UA: NEGATIVE mg/dL
Hgb urine dipstick: NEGATIVE
Ketones, ur: NEGATIVE mg/dL
Leukocytes, UA: NEGATIVE
Nitrite: NEGATIVE
PH: 6 (ref 5.0–8.0)
Protein, ur: NEGATIVE mg/dL
SPECIFIC GRAVITY, URINE: 1.006 (ref 1.005–1.030)

## 2017-08-04 LAB — INFLUENZA PANEL BY PCR (TYPE A & B)
Influenza A By PCR: NEGATIVE
Influenza B By PCR: NEGATIVE

## 2017-08-04 LAB — GROUP A STREP BY PCR: GROUP A STREP BY PCR: NOT DETECTED

## 2017-08-04 LAB — MAGNESIUM: Magnesium: 1.9 mg/dL (ref 1.7–2.4)

## 2017-08-04 MED ORDER — NYSTATIN 100000 UNIT/ML MT SUSP: 5.0000 mL | Freq: Four times a day (QID) | OROMUCOSAL | 0 refills | Status: DC

## 2017-08-04 NOTE — Patient Instructions (Signed)

## 2017-08-04 NOTE — Progress Notes (Signed)
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 Mike Gip on 07/21/2017 for follow-up prior to cycle 2 of AC. Patient continued to recover from recent hospitalization where she was treated for influenza A with Tamiflu. She also recently completed a 10 day course of doxycycline for lesion that was removed on her back. She denied any B symptoms.  She was admitted to the hospital from 06/28/2017 to 07/01/2017 for fever, cough and generalized weakness. Her nasal swab was positive for influenza A. She completed a course of Tamiflu.  Interval history-  Patient complains of sore throat.  Associated symptoms include hoarseness, pain while swallowing, sinus and nasal congestion and sore throat. Onset of symptoms was 4 days ago, gradually worsening since that time.  She is drinking moderate amounts of fluids. Home treatment thus far includes:  rest, hydration, NSAIDS/acetaminophen and OTC sore throat / cold products. No known sick contacts with similar symptoms. Cycle 2 Day 1 Cytoxan/Adriamycin approximately 5 days ago. There is no history of of similar symptoms.  ECOG FS:1 - Symptomatic but completely ambulatory  Review of systems- Review of Systems  Constitutional: Positive for malaise/fatigue. Negative for chills, fever and weight loss.  HENT: Positive for sore throat. Negative for congestion and ear pain.   Eyes: Negative.  Negative for blurred vision and double vision.  Respiratory: Positive for sputum production. Negative  for cough and shortness of breath.   Cardiovascular: Negative.  Negative for chest pain, palpitations and leg swelling.  Gastrointestinal: Positive for constipation. Negative for abdominal pain, diarrhea, nausea and vomiting.  Genitourinary: Positive for dysuria. Negative for frequency and urgency.       Occasional dysuria with vaginal irritation  Musculoskeletal: Negative for back pain and falls.  Skin: Negative.  Negative for rash.  Neurological: Positive for dizziness (On 1 occasion) and weakness. Negative for headaches.  Endo/Heme/Allergies: Negative.  Does not bruise/bleed easily.  Psychiatric/Behavioral: Negative.  Negative for depression. The patient is not nervous/anxious and does not have insomnia.      Current treatment- Cycle 2, Day 1 Cytoxan/ Adriamycin 07/30/17  Allergies  Allergen Reactions  . Fiorinal [Butalbital-Aspirin-Caffeine] Nausea And Vomiting and Other (See Comments)    SEVERE HEADACHE  . Sulfa Antibiotics Other (See Comments)    Burning esophagus and stomach  . Tramadol Nausea And Vomiting  . Other Rash    STERI-STRIPS-RASH STERI-STRIPS-RASH     Past Medical History:  Diagnosis Date  . Cancer ([place])   . Diabetes mellitus without complication ([place])   . Diabetic nephropathy ([place])   . Headache    migraines  . Heart murmur    ASYMPTOMATIC  . Hypertension   . Hypothyroidism   . Seborrheic keratosis   . Vitamin D deficiency      Past Surgical History:  Procedure Laterality Date  . APPENDECTOMY    . BREAST BIOPSY Right 05/28/2017   right breast bx 3 areas invasive mamm ca lymph node mets  . BREAST CYST ASPIRATION Bilateral    neg  . CHOLECYSTECTOMY    . COLONOSCOPY WITH PROPOFOL N/A 01/01/2015   Procedure:  COLONOSCOPY WITH PROPOFOL;  Surgeon: Josefine Class, MD;  Location: [facility] ENDOSCOPY;  Service: Endoscopy;  Laterality: N/A;  . EYE SURGERY     eyelid  . PORTACATH PLACEMENT Right 06/10/2017   Procedure: INSERTION PORT-A-CATH;  Surgeon:  Herbert Pun, MD;  Location: [facility] ORS;  Service: General;  Laterality: Right;  . STENT PLACEMENT ILIAC ([place] HX)    . TUBAL LIGATION      Social History   Socioeconomic History  . Marital status: Married    Spouse name: Not on file  . Number of children: Not on file  . Years of education: Not on file  . Highest education level: Not on file  Occupational History  . Not on file  Social Needs  . Financial resource strain: Not on file  . Food insecurity:    Worry: Not on file    Inability: Not on file  . Transportation needs:    Medical: Not on file    Non-medical: Not on file  Tobacco Use  . Smoking status: Former Smoker    Packs/day: 2.00    Years: 10.00    Pack years: 20.00    Types: Cigarettes    Last attempt to quit: 04/21/1972    Years since quitting: 45.3  . Smokeless tobacco: Never Used  Substance and Sexual Activity  . Alcohol use: No    Frequency: Never  . Drug use: No  . Sexual activity: Not on file  Lifestyle  . Physical activity:    Days per week: Not on file    Minutes per session: Not on file  . Stress: Not on file  Relationships  . Social connections:    Talks on phone: Not on file    Gets together: Not on file    Attends religious service: Not on file    Active member of club or organization: Not on file    Attends meetings of clubs or organizations: Not on file    Relationship status: Not on file  . Intimate partner violence:    Fear of current or ex partner: Not on file    Emotionally abused: Not on file    Physically abused: Not on file    Forced sexual activity: Not on file  Other Topics Concern  . Not on file  Social History Narrative  . Not on file    Family History  Problem Relation Age of Onset  . Cancer Mother   . Cancer Maternal Aunt   . Breast cancer Neg Hx      Current Outpatient Medications:  .  acetaminophen (TYLENOL) 500 MG tablet, Take 1,000 mg by mouth every 6 (six) hours as needed (for headaches.)., Disp: ,  Rfl:  .  atorvastatin (LIPITOR) 10 MG tablet, Take 10 mg by mouth every evening. , Disp: , Rfl:  .  Cholecalciferol 2000 UNITS CAPS, Take 4,000 Units by mouth daily. , Disp: , Rfl:  .  colesevelam (WELCHOL) 625 MG tablet, Take 1,875 mg by mouth every evening. , Disp: , Rfl:  .  dexamethasone (DECADRON) 4 MG tablet, Take 2 tablets by mouth once a day on the day after chemotherapy and then take 2 tablets two times a day for 2 days. Take with food., Disp: 30 tablet, Rfl: 1 .  levothyroxine (SYNTHROID, LEVOTHROID) 50 MCG tablet, Take 50 mcg by mouth daily before breakfast., Disp: , Rfl:  .  lidocaine-prilocaine (EMLA) cream, Apply 1 application topically as needed., Disp: 30 g, Rfl: 6 .  Melatonin 5  MG TABS, Take 5 mg by mouth at bedtime as needed (for sleep.)., Disp: , Rfl:  .  metFORMIN (GLUCOPHAGE) 1000 MG tablet, Take 1,000 mg by mouth 2 (two) times daily., Disp: , Rfl:  .  quinapril (ACCUPRIL) 5 MG tablet, Take 5 mg by mouth every morning. , Disp: , Rfl:  .  sodium bicarbonate 650 MG tablet, Take 1,300 mg by mouth at bedtime., Disp: , Rfl:  .  aspirin-acetaminophen-caffeine (EXCEDRIN MIGRAINE) 250-250-65 MG tablet, Take 2 tablets by mouth every 6 (six) hours as needed for headache., Disp: , Rfl:  .  LORazepam (ATIVAN) 0.5 MG tablet, Take 1 tablet (0.5 mg total) by mouth every 6 (six) hours as needed (Nausea or vomiting). (Patient not taking: Reported on 07/14/2017), Disp: 30 tablet, Rfl: 0 .  nystatin (MYCOSTATIN) 100000 UNIT/ML suspension, Take 5 mLs (500,000 Units total) by mouth 4 (four) times daily., Disp: 120 mL, Rfl: 0 .  ondansetron (ZOFRAN) 8 MG tablet, Take 1 tablet (8 mg total) by mouth 2 (two) times daily as needed. Start on the third day after chemotherapy. (Patient not taking: Reported on 07/28/2017), Disp: 30 tablet, Rfl: 1  Physical exam:  Vitals:   08/04/17 1115  BP: 132/84  Pulse: 81  Resp: 18  Temp: 98.6 F (37 C)  TempSrc: Tympanic  SpO2: 99%  Weight: 145 lb (65.8 kg)    Physical Exam  Constitutional: She is oriented to person, place, and time. Vital signs are normal.  HENT:  Head: Normocephalic and atraumatic.  Mouth/Throat: Mucous membranes are normal. Oropharyngeal exudate (White plaque present on oral mucosa, palate and tongue.) present. No tonsillar exudate.  Eyes: Pupils are equal, round, and reactive to light.  Neck: Normal range of motion.  Cardiovascular: Normal rate, regular rhythm and normal heart sounds.  No murmur heard. Pulmonary/Chest: Effort normal and breath sounds normal. No respiratory distress. She has no wheezes.  Abdominal: Soft. Normal appearance and bowel sounds are normal. She exhibits no distension. There is no tenderness.  Musculoskeletal: Normal range of motion. She exhibits no edema.  Neurological: She is alert and oriented to person, place, and time.  Skin: Skin is warm and dry. No rash noted.  Psychiatric: Judgment normal.     CMP Latest Ref Rng & Units 08/04/2017  Glucose 65 - 99 mg/dL 177(H)  BUN 6 - 20 mg/dL 13  Creatinine 0.44 - 1.00 mg/dL 0.79  Sodium 135 - 145 mmol/L 134(L)  Potassium 3.5 - 5.1 mmol/L 3.7  Chloride 101 - 111 mmol/L 98(L)  CO2 22 - 32 mmol/L 27  Calcium 8.9 - 10.3 mg/dL 9.3  Total Protein 6.5 - 8.1 g/dL 6.2(L)  Total Bilirubin 0.3 - 1.2 mg/dL 0.9  Alkaline Phos 38 - 126 U/L 59  AST 15 - 41 U/L 21  ALT 14 - 54 U/L 17   CBC Latest Ref Rng & Units 08/04/2017  WBC 3.6 - 11.0 K/uL 2.1(L)  Hemoglobin 12.0 - 16.0 g/dL 12.6  Hematocrit 35.0 - 47.0 % 36.4  Platelets 150 - 440 K/uL 77(L)    No images are attached to the encounter.  Dg Chest 2 View  Result Date: 07/07/2017 CLINICAL DATA:  Cough.  Shortness of breath. EXAM: CHEST - 2 VIEW COMPARISON:  06/28/2017. FINDINGS: Port-A-Cath noted with tip over the superior vena cava. Heart size normal. No focal infiltrate. Small bilateral pleural effusions. No pneumothorax. Degenerative changes thoracic spine. IMPRESSION: 1. PowerPort catheter noted with  tip over superior vena cava in stable position. 2. No acute infiltrate.  Small bilateral pleural effusions again noted. Electronically Signed   By: Marcello Moores  Register   On: 07/07/2017 11:04     Assessment and plan- Patient is a 72 y.o. female who presents for a sore throat 4 days.  1. Triple negative right breast cancer: Patient is status post cycle 2 day 1 Cytoxan/Adriamycin. Patient is scheduled to return to clinic on 08/11/2017 for labs, MD assessment and consideration of cycle 3 of Adriamycin/Cytoxan. Between cycle 1 and cycle 2 patient was admitted to the hospital for influenza A. Recently finished Tamiflu and doxycycline for a lesion that was removed from her back. Tolerating treatments "ok".   2. Sore throat: Possibly esophageal candidiasis or oropharyngeal candidiasis given she complains of hoarseness, painful swallowing and some loss of taste. Will also rule out influenza and strep throat. Patient immunocompromised with last chemo approximately 5 days ago. Patient is afebrile. Lungs are clear. Vital signs are stable. White plaque present on oral mucosa, palate and tongue. Will treat with Nysatin 5 ml QID.   3. Vaginal irritation/mild dysuria: Will get UA and Urine Culture. Possibly vaginitis. Currently using a cream/lubricant prescribed by PCP that seems to be helping "some".   Visit Diagnosis 1. Thrush   2. Dysuria   3. Throat pain   4. Sore throat (viral)   5. Malignant neoplasm of right breast in female, estrogen receptor negative, unspecified site of breast [facility])     Patient expressed understanding and was in agreement with this plan. She also understands that She can call clinic at any time with any questions, concerns, or complaints.   Greater than 50% was spent in counseling and coordination of care with this patient including but not limited to discussion of the relevant topics above (See A&P) including, but not limited to diagnosis and management of acute and chronic medical  conditions.    Faythe Casa, AGNP-C [facility] [place]- [phone] Pager- [phone]

## 2017-08-04 NOTE — Telephone Encounter (Signed)
Patient called complaining of a sore throat on one side when she swallows and states she had chemotherapy 4/11. She reports she also has phlegm but does not feel fevered though she has not checked, She states she knows her counts are going to drop soon, she has an appointment for lab only this morining at 1115.  Discussed with NP, lab appointment moved up to 1030 and she will see NP afterwards

## 2017-08-05 LAB — URINE CULTURE: Culture: NO GROWTH

## 2017-08-11 ENCOUNTER — Inpatient Hospital Stay: Payer: Medicare Other

## 2017-08-11 ENCOUNTER — Encounter: Payer: Self-pay | Admitting: Hematology and Oncology

## 2017-08-11 ENCOUNTER — Other Ambulatory Visit: Payer: Self-pay

## 2017-08-11 ENCOUNTER — Other Ambulatory Visit: Payer: Self-pay | Admitting: Hematology and Oncology

## 2017-08-11 ENCOUNTER — Inpatient Hospital Stay (HOSPITAL_BASED_OUTPATIENT_CLINIC_OR_DEPARTMENT_OTHER): Payer: Medicare Other | Admitting: Hematology and Oncology

## 2017-08-11 VITALS — BP 134/80 | HR 73 | Temp 98.1°F | Resp 18 | Wt 146.7 lb

## 2017-08-11 DIAGNOSIS — R110 Nausea: Secondary | ICD-10-CM | POA: Diagnosis not present

## 2017-08-11 DIAGNOSIS — C773 Secondary and unspecified malignant neoplasm of axilla and upper limb lymph nodes: Secondary | ICD-10-CM

## 2017-08-11 DIAGNOSIS — R5383 Other fatigue: Secondary | ICD-10-CM

## 2017-08-11 DIAGNOSIS — R531 Weakness: Secondary | ICD-10-CM | POA: Diagnosis not present

## 2017-08-11 DIAGNOSIS — Z7189 Other specified counseling: Secondary | ICD-10-CM

## 2017-08-11 DIAGNOSIS — Z87891 Personal history of nicotine dependence: Secondary | ICD-10-CM

## 2017-08-11 DIAGNOSIS — C50911 Malignant neoplasm of unspecified site of right female breast: Secondary | ICD-10-CM

## 2017-08-11 DIAGNOSIS — Z171 Estrogen receptor negative status [ER-]: Principal | ICD-10-CM

## 2017-08-11 DIAGNOSIS — D696 Thrombocytopenia, unspecified: Secondary | ICD-10-CM

## 2017-08-11 DIAGNOSIS — Z5111 Encounter for antineoplastic chemotherapy: Secondary | ICD-10-CM | POA: Diagnosis not present

## 2017-08-11 DIAGNOSIS — K5900 Constipation, unspecified: Secondary | ICD-10-CM | POA: Diagnosis not present

## 2017-08-11 DIAGNOSIS — C50411 Malignant neoplasm of upper-outer quadrant of right female breast: Secondary | ICD-10-CM | POA: Diagnosis not present

## 2017-08-11 DIAGNOSIS — B370 Candidal stomatitis: Secondary | ICD-10-CM

## 2017-08-11 DIAGNOSIS — Z006 Encounter for examination for normal comparison and control in clinical research program: Secondary | ICD-10-CM

## 2017-08-11 LAB — CBC WITH DIFFERENTIAL/PLATELET
Basophils Absolute: 0.1 10*3/uL (ref 0–0.1)
Basophils Relative: 1 %
Eosinophils Absolute: 0 10*3/uL (ref 0–0.7)
Eosinophils Relative: 1 %
HCT: 34.2 % — ABNORMAL LOW (ref 35.0–47.0)
Hemoglobin: 11.9 g/dL — ABNORMAL LOW (ref 12.0–16.0)
Lymphocytes Relative: 17 %
Lymphs Abs: 0.8 10*3/uL — ABNORMAL LOW (ref 1.0–3.6)
MCH: 31.4 pg (ref 26.0–34.0)
MCHC: 34.9 g/dL (ref 32.0–36.0)
MCV: 90.1 fL (ref 80.0–100.0)
Monocytes Absolute: 0.6 10*3/uL (ref 0.2–0.9)
Monocytes Relative: 12 %
Neutro Abs: 3.3 10*3/uL (ref 1.4–6.5)
Neutrophils Relative %: 69 %
Platelets: 123 10*3/uL — ABNORMAL LOW (ref 150–440)
RBC: 3.8 MIL/uL (ref 3.80–5.20)
RDW: 15.2 % — ABNORMAL HIGH (ref 11.5–14.5)
WBC: 4.7 10*3/uL (ref 3.6–11.0)

## 2017-08-11 LAB — COMPREHENSIVE METABOLIC PANEL
ALT: 26 U/L (ref 14–54)
AST: 19 U/L (ref 15–41)
Albumin: 3.9 g/dL (ref 3.5–5.0)
Alkaline Phosphatase: 46 U/L (ref 38–126)
Anion gap: 8 (ref 5–15)
BUN: 9 mg/dL (ref 6–20)
CO2: 25 mmol/L (ref 22–32)
Calcium: 9.2 mg/dL (ref 8.9–10.3)
Chloride: 102 mmol/L (ref 101–111)
Creatinine, Ser: 0.77 mg/dL (ref 0.44–1.00)
GFR calc Af Amer: >60 mL/min (ref >60)
GFR calc non Af Amer: >60 mL/min (ref >60)
Glucose, Bld: 132 mg/dL — ABNORMAL HIGH (ref 65–99)
Potassium: 3.9 mmol/L (ref 3.5–5.1)
Sodium: 135 mmol/L (ref 135–145)
Total Bilirubin: 0.8 mg/dL (ref 0.3–1.2)
Total Protein: 6.4 g/dL — ABNORMAL LOW (ref 6.5–8.1)

## 2017-08-11 LAB — MAGNESIUM: Magnesium: 2 mg/dL (ref 1.7–2.4)

## 2017-08-11 MED ORDER — HEPARIN SOD (PORK) LOCK FLUSH 100 UNIT/ML IV SOLN
500.0000 [IU] | Freq: Once | INTRAVENOUS | Status: AC
  Administered 2017-08-11: 500 [IU] via INTRAVENOUS
  Filled 2017-08-11: qty 5

## 2017-08-11 MED ORDER — NYSTATIN 100000 UNIT/ML MT SUSP: 5.0000 mL | Freq: Four times a day (QID) | OROMUCOSAL | 0 refills | Status: DC

## 2017-08-11 NOTE — Progress Notes (Signed)
[facility] day:  08/11/2017   Chief Complaint: Stephanie Sanders is a 72 y.o. female with multi-focal right breast cancer who is seen for assessment prior to cycle #3 AC.  HPI:  The patient was last seen in the medical oncology clinic on 07/28/2017.  At that time,  she was feeling better.  The site of recent excisional cyst removal was healing well with no signs of infection. She continued on Doxycycline. WBC was 4700 with an [place] of 2900. Potassium was 3.4 (low). Total bilirubin was elevated at 1.3 (direct 0.2).  Magnesium was 1.4 (low).   Chemotherapy was postponed 1 day secondary to an elevated bilirubin.  Repeat the following day was 1.1.  She received cycle #2 AC followed by Margarette Canada.  She received 2 gm of IV magnesium.  She saw Faythe Casa, NP on 08/04/2017 for a sore throat.  She was diagnosed with thrush.  She was treated with Nystatin.  During the interim, patient noted that her last cycles of chemotherapy was "rough". Patient notes that she developed oral candidiasis and difficulty swallowing. She improved with Nystatin. Patient still has hoarse voice quality and "lots of phlegm". Patient is eating well. Her weight is up 1 pound. She has experienced some nausea, however she has not used any of the prescribed antiemetics. Patient states, "I uses antacids and it goes away". Patient has been constipated. She is using Dulcolax.   Patient describes 3 episodes of her heart beating "very hard" while at rest. Patient notes that she could feels it in her chest, neck, and back. Patient has experienced generalized weakness overall. All of her symptoms have improved over the last few days. She has no B symptoms.  Patient denies pain in the clinic today.    Past Medical History:  Diagnosis Date  . Cancer ([place])   . Diabetes mellitus without complication ([place])   . Diabetic nephropathy ([place])   . Headache    migraines  . Heart murmur    ASYMPTOMATIC  .  Hypertension   . Hypothyroidism   . Seborrheic keratosis   . Vitamin D deficiency     Past Surgical History:  Procedure Laterality Date  . APPENDECTOMY    . BREAST BIOPSY Right 05/28/2017   right breast bx 3 areas invasive mamm ca lymph node mets  . BREAST CYST ASPIRATION Bilateral    neg  . CHOLECYSTECTOMY    . COLONOSCOPY WITH PROPOFOL N/A 01/01/2015   Procedure: COLONOSCOPY WITH PROPOFOL;  Surgeon: Josefine Class, MD;  Location: [facility] ENDOSCOPY;  Service: Endoscopy;  Laterality: N/A;  . EYE SURGERY     eyelid  . PORTACATH PLACEMENT Right 06/10/2017   Procedure: INSERTION PORT-A-CATH;  Surgeon: Herbert Pun, MD;  Location: [facility] ORS;  Service: General;  Laterality: Right;  . STENT PLACEMENT ILIAC ([place] HX)    . TUBAL LIGATION      Family History  Problem Relation Age of Onset  . Cancer Mother   . Cancer Maternal Aunt   . Breast cancer Neg Hx     Social History:  reports that she quit smoking about 45 years ago. Her smoking use included cigarettes. She has a 20.00 pack-year smoking history. She has never used smokeless tobacco. She reports that she does not drink alcohol or use drugs.  She has 3 children (daughters: age 78 and 65; son: age 40).  She is retired.  She worked for the [employer].  Her husband has dementia.  She  lives in [place].  The patient is alone today.  Allergies:  Allergies  Allergen Reactions  . Fiorinal [Butalbital-Aspirin-Caffeine] Nausea And Vomiting and Other (See Comments)    SEVERE HEADACHE  . Sulfa Antibiotics Other (See Comments)    Burning esophagus and stomach  . Tramadol Nausea And Vomiting  . Other Rash    STERI-STRIPS-RASH STERI-STRIPS-RASH    Current Medications: Current Outpatient Medications  Medication Sig Dispense Refill  . acetaminophen (TYLENOL) 500 MG tablet Take 1,000 mg by mouth every 6 (six) hours as needed (for headaches.).    Marland Kitchen atorvastatin (LIPITOR) 10 MG tablet Take 10 mg by mouth every evening.     .  Cholecalciferol 2000 UNITS CAPS Take 4,000 Units by mouth daily.     . colesevelam (WELCHOL) 625 MG tablet Take 1,875 mg by mouth every evening.     Marland Kitchen dexamethasone (DECADRON) 4 MG tablet Take 2 tablets by mouth once a day on the day after chemotherapy and then take 2 tablets two times a day for 2 days. Take with food. 30 tablet 1  . levothyroxine (SYNTHROID, LEVOTHROID) 50 MCG tablet Take 50 mcg by mouth daily before breakfast.    . lidocaine-prilocaine (EMLA) cream Apply 1 application topically as needed. 30 g 6  . LORazepam (ATIVAN) 0.5 MG tablet Take 1 tablet (0.5 mg total) by mouth every 6 (six) hours as needed (Nausea or vomiting). 30 tablet 0  . MAGNESIUM GLYCINATE PLUS PO Take 100 mg by mouth.    . Melatonin 5 MG TABS Take 5 mg by mouth at bedtime as needed (for sleep.).    Marland Kitchen metFORMIN (GLUCOPHAGE) 1000 MG tablet Take 1,000 mg by mouth 2 (two) times daily.    . quinapril (ACCUPRIL) 5 MG tablet Take 5 mg by mouth every morning.     . sodium bicarbonate 650 MG tablet Take 1,300 mg by mouth at bedtime.    Marland Kitchen aspirin-acetaminophen-caffeine (EXCEDRIN MIGRAINE) 250-250-65 MG tablet Take 2 tablets by mouth every 6 (six) hours as needed for headache.    . nystatin (MYCOSTATIN) 100000 UNIT/ML suspension Take 5 mLs (500,000 Units total) by mouth 4 (four) times daily. (Patient not taking: Reported on 08/11/2017) 120 mL 0  . ondansetron (ZOFRAN) 8 MG tablet Take 1 tablet (8 mg total) by mouth 2 (two) times daily as needed. Start on the third day after chemotherapy. (Patient not taking: Reported on 07/28/2017) 30 tablet 1   No current facility-administered medications for this visit.     Review of Systems:  GENERAL:  Feels "pretty good". No fevers, sweats or weight loss.  Weight up 1 pound. PERFORMANCE STATUS (ECOG):  1-2. HEENT:  No visual changes, runny nose, sore throat, mouth sores or tenderness. Lungs: No shortness of breath.  Cough, improved.  No hemoptysis. Cardiac:  Sensation of heart beating  fast on occasion (3 episodes).  No chest pain, palpitations, orthopnea, or PND. GI:  Eating well.  Constipation.  Some nausea.  No vomiting, diarrhea, melena or hematochezia.  Takes Welchol s/p cholecystectomy. GU:  No urgency, frequency, dysuria, or hematuria. Musculoskeletal:  No back pain.  No joint pain.  No muscle tenderness. Extremities:  No pain or swelling. Skin:  No rashes or skin changes. Neuro:  General weakness.  No headache, numbness or weakness, balance or coordination issues. Endocrine:  No diabetes, thyroid issues, hot flashes or night sweats. Psych:  No mood changes, depression or anxiety. Pain:  No focal pain. Review of systems:  All other systems reviewed and found to  be negative.   Physical Exam: Blood pressure 134/80, pulse 73, temperature 98.1 F (36.7 C), temperature source Oral, resp. rate 18, weight 146 lb 11.2 oz (66.5 kg). GENERAL:  Well developed, well nourished, woman sitting comfortably in the exam room in no acute distress. MENTAL STATUS:  Alert and oriented to person, place and time. HEAD:  Silver wig  Normocephalic, atraumatic, face symmetric, no Cushingoid features. EYES: Glasses.  Blue eyes.  Pupils equal round and reactive to light and accomodation.  No conjunctivitis or scleral icterus. ENT:  Oropharynx clear without lesion.  No thrush.  Tongue normal.  Mucous membranes moist.  RESPIRATORY:  Clear to auscultation without rales, wheezes or rhonchi. CARDIOVASCULAR:  Regular rate and rhythm without murmur, rub or gallop. ABDOMEN:  Soft, non-tender, with active bowel sounds, and no hepatosplenomegaly.  No masses. SKIN:  Back well healed.  No rashes, ulcers or lesions. EXTREMITIES: No edema, no skin discoloration or tenderness.  No palpable cords. LYMPH NODES: No palpable cervical, supraclavicular, axillary or inguinal adenopathy  NEUROLOGICAL: Unremarkable. PSYCH:  Appropriate.    Appointment on 08/11/2017  Component Date Value Ref Range Status  .  Magnesium 08/11/2017 2.0  1.7 - 2.4 mg/dL Final   Performed at [facility], [address] [place], [place] [address]  . Sodium 08/11/2017 135  135 - 145 mmol/L Final  . Potassium 08/11/2017 3.9  3.5 - 5.1 mmol/L Final  . Chloride 08/11/2017 102  101 - 111 mmol/L Final  . CO2 08/11/2017 25  22 - 32 mmol/L Final  . Glucose, Bld 08/11/2017 132* 65 - 99 mg/dL Final  . BUN 08/11/2017 9  6 - 20 mg/dL Final  . Creatinine, Ser 08/11/2017 0.77  0.44 - 1.00 mg/dL Final  . Calcium 08/11/2017 9.2  8.9 - 10.3 mg/dL Final  . Total Protein 08/11/2017 6.4* 6.5 - 8.1 g/dL Final  . Albumin 08/11/2017 3.9  3.5 - 5.0 g/dL Final  . AST 08/11/2017 19  15 - 41 U/L Final  . ALT 08/11/2017 26  14 - 54 U/L Final  . Alkaline Phosphatase 08/11/2017 46  38 - 126 U/L Final  . Total Bilirubin 08/11/2017 0.8  0.3 - 1.2 mg/dL Final  . GFR calc non Af Amer 08/11/2017 >60  >60 mL/min Final  . GFR calc Af Amer 08/11/2017 >60  >60 mL/min Final   Comment: (NOTE) The eGFR has been calculated using the CKD EPI equation. This calculation has not been validated in all clinical situations. eGFR's persistently <60 mL/min signify possible Chronic Kidney Disease.   Georgiann Hahn gap 08/11/2017 8  5 - 15 Final   Performed at [facility], [address] [place], [place] [address]  . WBC 08/11/2017 4.7  3.6 - 11.0 K/uL Final  . RBC 08/11/2017 3.80  3.80 - 5.20 MIL/uL Final  . Hemoglobin 08/11/2017 11.9* 12.0 - 16.0 g/dL Final  . HCT 08/11/2017 34.2* 35.0 - 47.0 % Final  . MCV 08/11/2017 90.1  80.0 - 100.0 fL Final  . MCH 08/11/2017 31.4  26.0 - 34.0 pg Final  . MCHC 08/11/2017 34.9  32.0 - 36.0 g/dL Final  . RDW 08/11/2017 15.2* 11.5 - 14.5 % Final  . Platelets 08/11/2017 123* 150 - 440 K/uL Final  . Neutrophils Relative % 08/11/2017 69  % Final  . Neutro Abs 08/11/2017 3.3  1.4 - 6.5 K/uL Final  . Lymphocytes Relative 08/11/2017 17  % Final  . Lymphs Abs 08/11/2017 0.8* 1.0 - 3.6 K/uL Final  .  Monocytes Relative  08/11/2017 12  % Final  . Monocytes Absolute 08/11/2017 0.6  0.2 - 0.9 K/uL Final  . Eosinophils Relative 08/11/2017 1  % Final  . Eosinophils Absolute 08/11/2017 0.0  0 - 0.7 K/uL Final  . Basophils Relative 08/11/2017 1  % Final  . Basophils Absolute 08/11/2017 0.1  0 - 0.1 K/uL Final   Performed at [facility], [address] [place], [place] [address]    Assessment:  Stephanie Sanders is a 72 y.o. 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  Pathology revealed two grade II invasive mammary carcinomas of no special type (4 cm from the nipple and 5 cm from the nipple).  Tumors were in close proximity.  Lymph node biopsy confirmed metastatic carcinoma of breast origin.  Tumor is ER negative (< 1%), PR negative and Her 2/neu 2+ by IHC.  [facility] genetic testing on 07/21/2017 revealed a variant of uncertain significance in RECQL4.  Right sided mammogram and ultrasound on 05/21/2017 revealed 2 highly suspicious masses in the right breast at the 10:30 position 4 cm from nipple (1.8 x 1.2 x 1.1 cm) and at the 10:30 position 5 cm from nipple (1.1 x 0.9 x 0.9 cm).  There was a suspicious 2.7 x 1 x 2.2 cm lymph node in the right axilla with asymmetric nodular cortical thickening.  Ultrasound guided biopsy of the 2 masses and lymph node on 05/28/2017 revealed grade II invasive mammary carcinoma of no special type (4 cm from the nipple and 5 cm from the nipple).  Lymph node biopsy revealed metastatic carcinoma of breast origin.  Tumor is ER negative (< 1%), PR negative and Her 2/neu 2+ by IHC.  FISH was negative.  CA 27.29 was 16.5 and CA15-3 was 17.6 on 06/04/2017.  Echo on 06/09/2017 revealed an EF of 60-65%.  She has enrolled on the UPBEAT clinical trial.  She is s/p 2 cycles of AC (06/23/2017 - 07/29/2017) with Udencya support.  Cycle #1 was complicated by influenza A.  [place] counts included an ANC of 0 and platelet count of 71,000.  Cycle #2 postponed due to slow recovery from influenza and excisional cyst removal with surrounding cellulitis (treated with 10 days of doxycycline).   She was admitted to [facility] from 06/28/2017 - 07/01/2017 with with fever, cough and generalized weakness.  Nasal swab was + for influenza A.  She completed a course of Tamiflu.  Symptomatically, she is feeling "some better".  Patient has had some nausea and constipation. Patient has generalized weakness. Exam stable.  WBC is 4700 with an [place] of 3300.  Platelet count is 123,000.  Plan: 1.  Labs today:  CBC with diff, CMP, Mg.  2.  Hold chemotherapy today.  Will reschedule cycle #3 AC.   Discuss plan for chemotherapy every 3 weeks. 3.  Discuss oral candidiasis. Patient had oral involvement in the hospital, and again last week. Will refill Nystatin Rx (Disp # 240 mL) for patient to have on hand at home.  4.  RTC on 04/30 for MD assessment, labs (CBC with diff, CMP, Mg), and cycle #3 AC 5.  RTC on 05/01 for Udenyca. 6.  RTC on 09/08/2017 for MD assessment, labs (CBC with diff, CMP, Mg), and +/- cycle #4 AC with Udenyca support.    Honor Loh, NP 08/11/2017,9:49 AM   I saw and evaluated the patient, participating in the key portions of the service and reviewing pertinent diagnostic studies  and records.  I reviewed the nurse practitioner's note and agree with the findings and the plan.  The assessment and plan were discussed with the patient.  Several questions were asked by the patient and answered.   Nolon Stalls, MD 08/11/2017,9:49 AM

## 2017-08-11 NOTE — Progress Notes (Signed)
Patient here for follow up. Sometimes  "heart is beating really hard, I can feel it".

## 2017-08-18 ENCOUNTER — Inpatient Hospital Stay: Payer: Medicare Other

## 2017-08-18 ENCOUNTER — Inpatient Hospital Stay (HOSPITAL_BASED_OUTPATIENT_CLINIC_OR_DEPARTMENT_OTHER): Payer: Medicare Other | Admitting: Hematology and Oncology

## 2017-08-18 VITALS — BP 131/81 | HR 80 | Temp 98.3°F | Resp 18 | Wt 146.1 lb

## 2017-08-18 DIAGNOSIS — C50911 Malignant neoplasm of unspecified site of right female breast: Secondary | ICD-10-CM

## 2017-08-18 DIAGNOSIS — Z87891 Personal history of nicotine dependence: Secondary | ICD-10-CM | POA: Diagnosis not present

## 2017-08-18 DIAGNOSIS — Z171 Estrogen receptor negative status [ER-]: Secondary | ICD-10-CM | POA: Diagnosis not present

## 2017-08-18 DIAGNOSIS — C50411 Malignant neoplasm of upper-outer quadrant of right female breast: Secondary | ICD-10-CM

## 2017-08-18 DIAGNOSIS — C773 Secondary and unspecified malignant neoplasm of axilla and upper limb lymph nodes: Secondary | ICD-10-CM

## 2017-08-18 DIAGNOSIS — Z5111 Encounter for antineoplastic chemotherapy: Secondary | ICD-10-CM

## 2017-08-18 DIAGNOSIS — E119 Type 2 diabetes mellitus without complications: Secondary | ICD-10-CM

## 2017-08-18 DIAGNOSIS — I10 Essential (primary) hypertension: Secondary | ICD-10-CM

## 2017-08-18 DIAGNOSIS — R0981 Nasal congestion: Secondary | ICD-10-CM

## 2017-08-18 LAB — MAGNESIUM: Magnesium: 2.1 mg/dL (ref 1.7–2.4)

## 2017-08-18 LAB — COMPREHENSIVE METABOLIC PANEL
ALT: 27 U/L (ref 14–54)
AST: 27 U/L (ref 15–41)
Albumin: 3.8 g/dL (ref 3.5–5.0)
Alkaline Phosphatase: 43 U/L (ref 38–126)
Anion gap: 10 (ref 5–15)
BUN: 11 mg/dL (ref 6–20)
CO2: 23 mmol/L (ref 22–32)
Calcium: 9.1 mg/dL (ref 8.9–10.3)
Chloride: 104 mmol/L (ref 101–111)
Creatinine, Ser: 0.75 mg/dL (ref 0.44–1.00)
GFR calc Af Amer: >60 mL/min (ref >60)
GFR calc non Af Amer: >60 mL/min (ref >60)
Glucose, Bld: 184 mg/dL — ABNORMAL HIGH (ref 65–99)
Potassium: 3.7 mmol/L (ref 3.5–5.1)
Sodium: 137 mmol/L (ref 135–145)
Total Bilirubin: 0.8 mg/dL (ref 0.3–1.2)
Total Protein: 6.4 g/dL — ABNORMAL LOW (ref 6.5–8.1)

## 2017-08-18 LAB — CBC WITH DIFFERENTIAL/PLATELET
Basophils Absolute: 0.1 10*3/uL (ref 0–0.1)
Basophils Relative: 1 %
Eosinophils Absolute: 0.1 10*3/uL (ref 0–0.7)
Eosinophils Relative: 3 %
HCT: 34.4 % — ABNORMAL LOW (ref 35.0–47.0)
Hemoglobin: 12 g/dL (ref 12.0–16.0)
Lymphocytes Relative: 15 %
Lymphs Abs: 0.6 10*3/uL — ABNORMAL LOW (ref 1.0–3.6)
MCH: 31.7 pg (ref 26.0–34.0)
MCHC: 35 g/dL (ref 32.0–36.0)
MCV: 90.6 fL (ref 80.0–100.0)
Monocytes Absolute: 0.6 10*3/uL (ref 0.2–0.9)
Monocytes Relative: 15 %
Neutro Abs: 2.7 10*3/uL (ref 1.4–6.5)
Neutrophils Relative %: 66 %
Platelets: 211 10*3/uL (ref 150–440)
RBC: 3.79 MIL/uL — ABNORMAL LOW (ref 3.80–5.20)
RDW: 16.2 % — ABNORMAL HIGH (ref 11.5–14.5)
WBC: 4.1 10*3/uL (ref 3.6–11.0)

## 2017-08-18 MED ORDER — SODIUM CHLORIDE 0.9% FLUSH
10.0000 mL | Freq: Once | INTRAVENOUS | Status: AC
  Administered 2017-08-18: 10 mL via INTRAVENOUS
  Filled 2017-08-18: qty 10

## 2017-08-18 MED ORDER — SODIUM CHLORIDE 0.9 % IV SOLN
Freq: Once | INTRAVENOUS | Status: AC
  Administered 2017-08-18: 10:00:00 via INTRAVENOUS
  Filled 2017-08-18: qty 1000

## 2017-08-18 MED ORDER — FOSAPREPITANT DIMEGLUMINE INJECTION 150 MG
Freq: Once | INTRAVENOUS | Status: AC
  Administered 2017-08-18: 10:00:00 via INTRAVENOUS
  Filled 2017-08-18: qty 5

## 2017-08-18 MED ORDER — DOXORUBICIN HCL CHEMO IV INJECTION 2 MG/ML
60.0000 mg/m2 | Freq: Once | INTRAVENOUS | Status: AC
  Administered 2017-08-18: 110 mg via INTRAVENOUS
  Filled 2017-08-18 (×2): qty 55

## 2017-08-18 MED ORDER — SODIUM CHLORIDE 0.9 % IV SOLN
600.0000 mg/m2 | Freq: Once | INTRAVENOUS | Status: AC
  Administered 2017-08-18: 1100 mg via INTRAVENOUS
  Filled 2017-08-18: qty 55
  Filled 2017-08-18: qty 50

## 2017-08-18 MED ORDER — HEPARIN SOD (PORK) LOCK FLUSH 100 UNIT/ML IV SOLN: 500.0000 [IU] | Freq: Once | INTRAVENOUS | Status: DC | PRN

## 2017-08-18 MED ORDER — PALONOSETRON HCL INJECTION 0.25 MG/5ML
0.2500 mg | Freq: Once | INTRAVENOUS | Status: AC
  Administered 2017-08-18: 0.25 mg via INTRAVENOUS
  Filled 2017-08-18: qty 5

## 2017-08-18 MED ORDER — HEPARIN SOD (PORK) LOCK FLUSH 100 UNIT/ML IV SOLN
500.0000 [IU] | Freq: Once | INTRAVENOUS | Status: AC
  Administered 2017-08-18: 500 [IU] via INTRAVENOUS
  Filled 2017-08-18: qty 5

## 2017-08-18 NOTE — Progress Notes (Signed)
Patient offers no complaints today.  States she feels good today.

## 2017-08-18 NOTE — Progress Notes (Signed)
[facility] day:  08/18/2017   Chief Complaint: Stephanie Sanders is a 72 y.o. female with multi-focal right breast cancer who is seen for assessment prior to cycle #3 AC.  HPI:  The patient was last seen in the medical oncology clinic on 08/11/2017.  At that time, she was feeling better.  She had nausea, constipation, and general weakness post chemotherapy.  WBC was 4700 with an [place] of 3300.  Platelet count was 123,000.  Chemotherapy was held.  We discussed the plan for chemotherapy every 3 weeks.  During the interim, patient notes that she has been more energetic this week. Continues to have some nasal congestion, however she relates this to seasonal allergies.  Patient describes the sensation of an "air bubble" in her esophagus when swallowing. She states, "sometimes I can drink and it ill go away. Other times, it gives me more trouble".   Patient denies other acute symptoms today.  Patient does not verbalize any concerns with regards to her breasts today. She has not experienced any B symptoms. Patient is eating well. Her weight remains stable.   Patient denies pain in the clinic today.    Past Medical History:  Diagnosis Date  . Cancer ([place])   . Diabetes mellitus without complication ([place])   . Diabetic nephropathy ([place])   . Headache    migraines  . Heart murmur    ASYMPTOMATIC  . Hypertension   . Hypothyroidism   . Seborrheic keratosis   . Vitamin D deficiency     Past Surgical History:  Procedure Laterality Date  . APPENDECTOMY    . BREAST BIOPSY Right 05/28/2017   right breast bx 3 areas invasive mamm ca lymph node mets  . BREAST CYST ASPIRATION Bilateral    neg  . CHOLECYSTECTOMY    . COLONOSCOPY WITH PROPOFOL N/A 01/01/2015   Procedure: COLONOSCOPY WITH PROPOFOL;  Surgeon: Josefine Class, MD;  Location: [facility] ENDOSCOPY;  Service: Endoscopy;  Laterality: N/A;  . EYE SURGERY     eyelid  . PORTACATH PLACEMENT Right 06/10/2017    Procedure: INSERTION PORT-A-CATH;  Surgeon: Herbert Pun, MD;  Location: [facility] ORS;  Service: General;  Laterality: Right;  . STENT PLACEMENT ILIAC ([place] HX)    . TUBAL LIGATION      Family History  Problem Relation Age of Onset  . Cancer Mother   . Cancer Maternal Aunt   . Breast cancer Neg Hx     Social History:  reports that she quit smoking about 45 years ago. Her smoking use included cigarettes. She has a 20.00 pack-year smoking history. She has never used smokeless tobacco. She reports that she does not drink alcohol or use drugs.  She has 3 children (daughters: age 16 and 89; son: age 4).  She is retired.  She worked for the [employer].  Her husband has dementia.  She lives in [place].  The patient is alone today.  Allergies:  Allergies  Allergen Reactions  . Fiorinal [Butalbital-Aspirin-Caffeine] Nausea And Vomiting and Other (See Comments)    SEVERE HEADACHE  . Sulfa Antibiotics Other (See Comments)    Burning esophagus and stomach  . Tramadol Nausea And Vomiting  . Other Rash    STERI-STRIPS-RASH STERI-STRIPS-RASH    Current Medications: Current Outpatient Medications  Medication Sig Dispense Refill  . acetaminophen (TYLENOL) 500 MG tablet Take 1,000 mg by mouth every 6 (six) hours as needed (for headaches.).    Marland Kitchen aspirin-acetaminophen-caffeine ([place]) [phone]  MG tablet Take 2 tablets by mouth every 6 (six) hours as needed for headache.    Marland Kitchen atorvastatin (LIPITOR) 10 MG tablet Take 10 mg by mouth every evening.     . Cholecalciferol 2000 UNITS CAPS Take 4,000 Units by mouth daily.     . colesevelam (WELCHOL) 625 MG tablet Take 1,875 mg by mouth every evening.     Marland Kitchen dexamethasone (DECADRON) 4 MG tablet Take 2 tablets by mouth once a day on the day after chemotherapy and then take 2 tablets two times a day for 2 days. Take with food. 30 tablet 1  . levothyroxine (SYNTHROID, LEVOTHROID) 50 MCG tablet Take 50 mcg by mouth daily before breakfast.    .  lidocaine-prilocaine (EMLA) cream Apply 1 application topically as needed. 30 g 6  . LORazepam (ATIVAN) 0.5 MG tablet Take 1 tablet (0.5 mg total) by mouth every 6 (six) hours as needed (Nausea or vomiting). 30 tablet 0  . MAGNESIUM GLYCINATE PLUS PO Take 100 mg by mouth.    . Melatonin 5 MG TABS Take 5 mg by mouth at bedtime as needed (for sleep.).    Marland Kitchen metFORMIN (GLUCOPHAGE) 1000 MG tablet Take 1,000 mg by mouth 2 (two) times daily.    Marland Kitchen nystatin (MYCOSTATIN) 100000 UNIT/ML suspension Take 5 mLs (500,000 Units total) by mouth 4 (four) times daily. 240 mL 0  . ondansetron (ZOFRAN) 8 MG tablet Take 1 tablet (8 mg total) by mouth 2 (two) times daily as needed. Start on the third day after chemotherapy. 30 tablet 1  . quinapril (ACCUPRIL) 5 MG tablet Take 5 mg by mouth every morning.     . sodium bicarbonate 650 MG tablet Take 1,300 mg by mouth at bedtime.     No current facility-administered medications for this visit.    Facility-Administered Medications Ordered in Other Visits  Medication Dose Route Frequency Provider Last Rate Last Dose  . heparin lock flush 100 unit/mL  500 Units Intracatheter Once PRN Lequita Asal, MD        Review of Systems:  GENERAL:  Feels "more energetic".  More active.  No fevers, sweats or weight loss.  Weight stable. PERFORMANCE STATUS (ECOG):  1 HEENT:  Allergic rhinitis; congestions. Cough; resolved. No visual changes, sore throat, mouth sores or tenderness. Lungs: No shortness of breath or cough.  No hemoptysis. Cardiac:  No chest pain, palpitations, orthopnea, or PND. GI:  Eating well. Constipation; improved. No nausea, vomiting, diarrhea, melena or hematochezia. Takes Welchol s/p cholecystectomy. GU:  No urgency, frequency, dysuria, or hematuria. Musculoskeletal:  No back pain.  No joint pain.  No muscle tenderness. Extremities:  No pain or swelling. Skin:  No rashes or skin changes. Neuro:  No headache, numbness or weakness, balance or  coordination issues. Endocrine:  No diabetes, thyroid issues, hot flashes or night sweats. Psych:  No mood changes, depression or anxiety. Pain:  No focal pain. Review of systems:  All other systems reviewed and found to be negative.   Physical Exam: Blood pressure 131/81, pulse 80, temperature 98.3 F (36.8 C), temperature source Tympanic, resp. rate 18, weight 146 lb 2 oz (66.3 kg). GENERAL:  Well developed, well nourished, woman sitting comfortably in the exam room in no acute distress. MENTAL STATUS:  Alert and oriented to person, place and time. HEAD:  Normocephalic, atraumatic, face symmetric, no Cushingoid features. EYES:  Glasses.  Blue eyes.  Pupils equal round and reactive to light and accomodation.  No conjunctivitis or scleral icterus.  ENT:  Oropharynx clear without lesion.  Tongue normal. Mucous membranes moist.  RESPIRATORY:  Clear to auscultation without rales, wheezes or rhonchi. CARDIOVASCULAR:  Regular rate and rhythm without murmur, rub or gallop. ABDOMEN:  Soft, non-tender, with active bowel sounds, and no hepatosplenomegaly.  No masses. SKIN:  Back remains well healed without evidence of infection.  Chest with tiny cystic lesion near old infected cyst removal.  No rashes, ulcers or lesions. EXTREMITIES: No edema, no skin discoloration or tenderness.  No palpable cords. LYMPH NODES: No palpable cervical, supraclavicular, axillary or inguinal adenopathy  NEUROLOGICAL: Unremarkable. PSYCH:  Appropriate.    Infusion on 08/18/2017  Component Date Value Ref Range Status  . Magnesium 08/18/2017 2.1  1.7 - 2.4 mg/dL Final   Performed at [facility], [address] [place], [place] [address]  . Sodium 08/18/2017 137  135 - 145 mmol/L Final  . Potassium 08/18/2017 3.7  3.5 - 5.1 mmol/L Final  . Chloride 08/18/2017 104  101 - 111 mmol/L Final  . CO2 08/18/2017 23  22 - 32 mmol/L Final  . Glucose, Bld 08/18/2017 184* 65 - 99 mg/dL Final  . BUN 08/18/2017 11  6 - 20  mg/dL Final  . Creatinine, Ser 08/18/2017 0.75  0.44 - 1.00 mg/dL Final  . Calcium 08/18/2017 9.1  8.9 - 10.3 mg/dL Final  . Total Protein 08/18/2017 6.4* 6.5 - 8.1 g/dL Final  . Albumin 08/18/2017 3.8  3.5 - 5.0 g/dL Final  . AST 08/18/2017 27  15 - 41 U/L Final  . ALT 08/18/2017 27  14 - 54 U/L Final  . Alkaline Phosphatase 08/18/2017 43  38 - 126 U/L Final  . Total Bilirubin 08/18/2017 0.8  0.3 - 1.2 mg/dL Final  . GFR calc non Af Amer 08/18/2017 >60  >60 mL/min Final  . GFR calc Af Amer 08/18/2017 >60  >60 mL/min Final   Comment: (NOTE) The eGFR has been calculated using the CKD EPI equation. This calculation has not been validated in all clinical situations. eGFR's persistently <60 mL/min signify possible Chronic Kidney Disease.   Georgiann Hahn gap 08/18/2017 10  5 - 15 Final   Performed at [facility], [address] [place], [place] [address]  . WBC 08/18/2017 4.1  3.6 - 11.0 K/uL Final  . RBC 08/18/2017 3.79* 3.80 - 5.20 MIL/uL Final  . Hemoglobin 08/18/2017 12.0  12.0 - 16.0 g/dL Final  . HCT 08/18/2017 34.4* 35.0 - 47.0 % Final  . MCV 08/18/2017 90.6  80.0 - 100.0 fL Final  . MCH 08/18/2017 31.7  26.0 - 34.0 pg Final  . MCHC 08/18/2017 35.0  32.0 - 36.0 g/dL Final  . RDW 08/18/2017 16.2* 11.5 - 14.5 % Final  . Platelets 08/18/2017 211  150 - 440 K/uL Final  . Neutrophils Relative % 08/18/2017 66  % Final  . Neutro Abs 08/18/2017 2.7  1.4 - 6.5 K/uL Final  . Lymphocytes Relative 08/18/2017 15  % Final  . Lymphs Abs 08/18/2017 0.6* 1.0 - 3.6 K/uL Final  . Monocytes Relative 08/18/2017 15  % Final  . Monocytes Absolute 08/18/2017 0.6  0.2 - 0.9 K/uL Final  . Eosinophils Relative 08/18/2017 3  % Final  . Eosinophils Absolute 08/18/2017 0.1  0 - 0.7 K/uL Final  . Basophils Relative 08/18/2017 1  % Final  . Basophils Absolute 08/18/2017 0.1  0 - 0.1 K/uL Final   Performed at [facility], [address], [place], [place] [address]  Assessment:  ADILYN HUMES  is a 72 y.o. 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  Pathology revealed two grade II invasive mammary carcinomas of no special type (4 cm from the nipple and 5 cm from the nipple).  Tumors were in close proximity.  Lymph node biopsy confirmed metastatic carcinoma of breast origin.  Tumor is ER negative (< 1%), PR negative and Her 2/neu 2+ by IHC.  [facility] genetic testing on 07/21/2017 revealed a variant of uncertain significance in RECQL4.  Right sided mammogram and ultrasound on 05/21/2017 revealed 2 highly suspicious masses in the right breast at the 10:30 position 4 cm from nipple (1.8 x 1.2 x 1.1 cm) and at the 10:30 position 5 cm from nipple (1.1 x 0.9 x 0.9 cm).  There was a suspicious 2.7 x 1 x 2.2 cm lymph node in the right axilla with asymmetric nodular cortical thickening.  Ultrasound guided biopsy of the 2 masses and lymph node on 05/28/2017 revealed grade II invasive mammary carcinoma of no special type (4 cm from the nipple and 5 cm from the nipple).  Lymph node biopsy revealed metastatic carcinoma of breast origin.  Tumor is ER negative (< 1%), PR negative and Her 2/neu 2+ by IHC.  FISH was negative.  CA 27.29 was 16.5 and CA15-3 was 17.6 on 06/04/2017.  Echo on 06/09/2017 revealed an EF of 60-65%.  She has enrolled on the UPBEAT clinical trial.  She is s/p 2 cycles of AC (06/23/2017 - 07/29/2017) with Udencya support.  Cycle #1 was complicated by influenza A.  [place] counts included an ANC of 0 and platelet count of 71,000. Cycle #2 postponed due to slow recovery from influenza and excisional cyst removal with surrounding cellulitis (treated with 10 days of doxycycline).   She was admitted to [facility] from 06/28/2017 - 07/01/2017 with with fever, cough and generalized weakness.  Nasal swab was + for influenza A.  She completed a course of Tamiflu.  Symptomatically, she is feeling better overall. She has allergy symptoms  (mainly congestion). No fevers. Constipation has has improved. Eating well; no weight loss. Exam is stable.  Back remains well healed without evidence of infection.  WBC is 4100 with an [place] of 2700.  Platelet count is 211,000.  Plan: 1.  Labs today:  CBC with diff, CMP, Mg 2.  Cycle #3 AC today. 3.  RTC on 08/19/2017 for Udencya. 4.  RTC on 09/08/2017 for MD assessment, labs (CBC with diff, CMP, Mg), and cycle #4 AC with Udenyca support.  Honor Loh, NP 08/18/2017,12:52 PM   I saw and evaluated the patient, participating in the key portions of the service and reviewing pertinent diagnostic studies and records.  I reviewed the nurse practitioner's note and agree with the findings and the plan.  The assessment and plan were discussed with the patient.  Several questions were asked by the patient and answered.   Nolon Stalls, MD 08/18/2017,12:52 PM

## 2017-08-19 ENCOUNTER — Encounter: Payer: Self-pay | Admitting: Hematology and Oncology

## 2017-08-19 ENCOUNTER — Inpatient Hospital Stay: Payer: Medicare Other | Attending: Hematology and Oncology

## 2017-08-19 DIAGNOSIS — Z5111 Encounter for antineoplastic chemotherapy: Secondary | ICD-10-CM | POA: Insufficient documentation

## 2017-08-19 DIAGNOSIS — Z5189 Encounter for other specified aftercare: Secondary | ICD-10-CM | POA: Diagnosis not present

## 2017-08-19 DIAGNOSIS — C50411 Malignant neoplasm of upper-outer quadrant of right female breast: Secondary | ICD-10-CM | POA: Diagnosis present

## 2017-08-19 DIAGNOSIS — C50911 Malignant neoplasm of unspecified site of right female breast: Secondary | ICD-10-CM

## 2017-08-19 DIAGNOSIS — Z171 Estrogen receptor negative status [ER-]: Secondary | ICD-10-CM

## 2017-08-19 DIAGNOSIS — C773 Secondary and unspecified malignant neoplasm of axilla and upper limb lymph nodes: Secondary | ICD-10-CM | POA: Insufficient documentation

## 2017-08-19 MED ORDER — PEGFILGRASTIM-CBQV 6 MG/0.6ML ~~LOC~~ SOSY
6.0000 mg | PREFILLED_SYRINGE | Freq: Once | SUBCUTANEOUS | Status: AC
  Administered 2017-08-19: 6 mg via SUBCUTANEOUS
  Filled 2017-08-19: qty 0.6

## 2017-08-21 ENCOUNTER — Telehealth: Payer: Self-pay | Admitting: *Deleted

## 2017-08-21 NOTE — Telephone Encounter (Signed)
Patient called questioning why she does not have lab appointment next week like she usually has and she also has a question about her Nystatin. Please return her call [phone]

## 2017-08-21 NOTE — Telephone Encounter (Signed)
Counts are stable. Dr. Mike Gip didn't want to bring her in next week. We discussed this in clinic with her.

## 2017-08-21 NOTE — Telephone Encounter (Signed)
Call returned to patient and left message on voice mail why labs not ordered

## 2017-09-08 ENCOUNTER — Inpatient Hospital Stay: Payer: Medicare Other

## 2017-09-08 ENCOUNTER — Encounter: Payer: Self-pay | Admitting: Hematology and Oncology

## 2017-09-08 ENCOUNTER — Inpatient Hospital Stay (HOSPITAL_BASED_OUTPATIENT_CLINIC_OR_DEPARTMENT_OTHER): Payer: Medicare Other | Admitting: Hematology and Oncology

## 2017-09-08 ENCOUNTER — Other Ambulatory Visit: Payer: Self-pay

## 2017-09-08 VITALS — BP 141/83 | HR 73 | Temp 97.5°F | Resp 18 | Wt 144.1 lb

## 2017-09-08 DIAGNOSIS — C50411 Malignant neoplasm of upper-outer quadrant of right female breast: Secondary | ICD-10-CM

## 2017-09-08 DIAGNOSIS — Z87891 Personal history of nicotine dependence: Secondary | ICD-10-CM

## 2017-09-08 DIAGNOSIS — C50911 Malignant neoplasm of unspecified site of right female breast: Secondary | ICD-10-CM

## 2017-09-08 DIAGNOSIS — C773 Secondary and unspecified malignant neoplasm of axilla and upper limb lymph nodes: Secondary | ICD-10-CM

## 2017-09-08 DIAGNOSIS — E119 Type 2 diabetes mellitus without complications: Secondary | ICD-10-CM

## 2017-09-08 DIAGNOSIS — Z171 Estrogen receptor negative status [ER-]: Principal | ICD-10-CM

## 2017-09-08 DIAGNOSIS — Z5111 Encounter for antineoplastic chemotherapy: Secondary | ICD-10-CM

## 2017-09-08 DIAGNOSIS — Z7189 Other specified counseling: Secondary | ICD-10-CM

## 2017-09-08 DIAGNOSIS — I10 Essential (primary) hypertension: Secondary | ICD-10-CM | POA: Diagnosis not present

## 2017-09-08 LAB — COMPREHENSIVE METABOLIC PANEL
ALT: 22 U/L (ref 14–54)
AST: 19 U/L (ref 15–41)
Albumin: 4.2 g/dL (ref 3.5–5.0)
Alkaline Phosphatase: 37 U/L — ABNORMAL LOW (ref 38–126)
Anion gap: 11 (ref 5–15)
BUN: 12 mg/dL (ref 6–20)
CO2: 23 mmol/L (ref 22–32)
Calcium: 9.1 mg/dL (ref 8.9–10.3)
Chloride: 105 mmol/L (ref 101–111)
Creatinine, Ser: 0.66 mg/dL (ref 0.44–1.00)
GFR calc Af Amer: >60 mL/min (ref >60)
GFR calc non Af Amer: >60 mL/min (ref >60)
Glucose, Bld: 127 mg/dL — ABNORMAL HIGH (ref 65–99)
Potassium: 3.5 mmol/L (ref 3.5–5.1)
Sodium: 139 mmol/L (ref 135–145)
Total Bilirubin: 0.8 mg/dL (ref 0.3–1.2)
Total Protein: 6.8 g/dL (ref 6.5–8.1)

## 2017-09-08 LAB — CBC WITH DIFFERENTIAL/PLATELET
Basophils Absolute: 0.1 10*3/uL (ref 0–0.1)
Basophils Relative: 2 %
Eosinophils Absolute: 0 10*3/uL (ref 0–0.7)
Eosinophils Relative: 1 %
HCT: 30.6 % — ABNORMAL LOW (ref 35.0–47.0)
Hemoglobin: 10.9 g/dL — ABNORMAL LOW (ref 12.0–16.0)
Lymphocytes Relative: 13 %
Lymphs Abs: 0.5 10*3/uL — ABNORMAL LOW (ref 1.0–3.6)
MCH: 32.5 pg (ref 26.0–34.0)
MCHC: 35.6 g/dL (ref 32.0–36.0)
MCV: 91.3 fL (ref 80.0–100.0)
Monocytes Absolute: 0.9 10*3/uL (ref 0.2–0.9)
Monocytes Relative: 21 %
Neutro Abs: 2.6 10*3/uL (ref 1.4–6.5)
Neutrophils Relative %: 63 %
Platelets: 220 10*3/uL (ref 150–440)
RBC: 3.35 MIL/uL — ABNORMAL LOW (ref 3.80–5.20)
RDW: 18.1 % — ABNORMAL HIGH (ref 11.5–14.5)
WBC: 4.1 10*3/uL (ref 3.6–11.0)

## 2017-09-08 LAB — MAGNESIUM: Magnesium: 2 mg/dL (ref 1.7–2.4)

## 2017-09-08 MED ORDER — SODIUM CHLORIDE 0.9 % IV SOLN
600.0000 mg/m2 | Freq: Once | INTRAVENOUS | Status: AC
  Administered 2017-09-08: 1100 mg via INTRAVENOUS
  Filled 2017-09-08: qty 50

## 2017-09-08 MED ORDER — DOXORUBICIN HCL CHEMO IV INJECTION 2 MG/ML
60.0000 mg/m2 | Freq: Once | INTRAVENOUS | Status: AC
  Administered 2017-09-08: 110 mg via INTRAVENOUS
  Filled 2017-09-08: qty 55

## 2017-09-08 MED ORDER — SODIUM CHLORIDE 0.9 % IV SOLN
Freq: Once | INTRAVENOUS | Status: AC
  Administered 2017-09-08: 10:00:00 via INTRAVENOUS
  Filled 2017-09-08: qty 5

## 2017-09-08 MED ORDER — SODIUM CHLORIDE 0.9 % IV SOLN
Freq: Once | INTRAVENOUS | Status: AC
  Administered 2017-09-08: 10:00:00 via INTRAVENOUS
  Filled 2017-09-08: qty 1000

## 2017-09-08 MED ORDER — PALONOSETRON HCL INJECTION 0.25 MG/5ML
0.2500 mg | Freq: Once | INTRAVENOUS | Status: AC
  Administered 2017-09-08: 0.25 mg via INTRAVENOUS
  Filled 2017-09-08: qty 5

## 2017-09-08 MED ORDER — HEPARIN SOD (PORK) LOCK FLUSH 100 UNIT/ML IV SOLN
500.0000 [IU] | Freq: Once | INTRAVENOUS | Status: AC
  Administered 2017-09-08: 500 [IU] via INTRAVENOUS

## 2017-09-08 NOTE — Progress Notes (Signed)
Patient here today for follow up. No concerns voiced.  °

## 2017-09-08 NOTE — Progress Notes (Signed)
[facility] day:  09/08/2017   Chief Complaint: Stephanie Sanders is a 72 y.o. female with multi-focal right breast cancer who is seen for assessment prior to cycle #4 AC.  HPI:  The patient was last seen in the medical oncology clinic on 08/18/2017.  At that time, she was feeling better overall. She had allergy symptoms (mainly congestion). She denied any fevers. Constipation had improved. She was eating well with no weight loss. Exam was stable.  Her back remained well healed without evidence of infection.  WBC was 4100 with an [place] of 2700.  Platelet count was 211,000.  She received cycle #3 AC with Udencya.  During the interim, she has done well.  She notes come constipation after treatment.  She has had trouble staying a sleep.  She is drinking 1-2 Ensure/day.  She notes swallowing issues worse with burping.  She has a "lump sensation" especially when drinking water.  Swallowing is better after 7 days.  She is taking Claritin daily.  She denies any phlegm or cough.  She took Dulcolax again.   Past Medical History:  Diagnosis Date  . Cancer ([place])   . Diabetes mellitus without complication ([place])   . Diabetic nephropathy ([place])   . Headache    migraines  . Heart murmur    ASYMPTOMATIC  . Hypertension   . Hypothyroidism   . Seborrheic keratosis   . Vitamin D deficiency     Past Surgical History:  Procedure Laterality Date  . APPENDECTOMY    . BREAST BIOPSY Right 05/28/2017   right breast bx 3 areas invasive mamm ca lymph node mets  . BREAST CYST ASPIRATION Bilateral    neg  . CHOLECYSTECTOMY    . COLONOSCOPY WITH PROPOFOL N/A 01/01/2015   Procedure: COLONOSCOPY WITH PROPOFOL;  Surgeon: Josefine Class, MD;  Location: [facility] ENDOSCOPY;  Service: Endoscopy;  Laterality: N/A;  . EYE SURGERY     eyelid  . PORTACATH PLACEMENT Right 06/10/2017   Procedure: INSERTION PORT-A-CATH;  Surgeon: Herbert Pun, MD;  Location: [facility] ORS;   Service: General;  Laterality: Right;  . STENT PLACEMENT ILIAC ([place] HX)    . TUBAL LIGATION      Family History  Problem Relation Age of Onset  . Cancer Mother   . Cancer Maternal Aunt   . Breast cancer Neg Hx     Social History:  reports that she quit smoking about 45 years ago. Her smoking use included cigarettes. She has a 20.00 pack-year smoking history. She has never used smokeless tobacco. She reports that she does not drink alcohol or use drugs.  She has 3 children (daughters: age 48 and 34; son: age 72).  She is retired.  She worked for the [employer].  Her husband has dementia.  She lives in [place].  The patient is accompanied by [facility], her daughter from [place], today.  Allergies:  Allergies  Allergen Reactions  . Fiorinal [Butalbital-Aspirin-Caffeine] Nausea And Vomiting and Other (See Comments)    SEVERE HEADACHE  . Sulfa Antibiotics Other (See Comments)    Burning esophagus and stomach  . Tramadol Nausea And Vomiting  . Other Rash    STERI-STRIPS-RASH STERI-STRIPS-RASH    Current Medications: Current Outpatient Medications  Medication Sig Dispense Refill  . acetaminophen (TYLENOL) 500 MG tablet Take 1,000 mg by mouth every 6 (six) hours as needed (for headaches.).    Marland Kitchen atorvastatin (LIPITOR) 10 MG tablet Take 10 mg by mouth every evening.     Marland Kitchen  Cholecalciferol 2000 UNITS CAPS Take 4,000 Units by mouth daily.     . colesevelam (WELCHOL) 625 MG tablet Take 1,875 mg by mouth every evening.     Marland Kitchen dexamethasone (DECADRON) 4 MG tablet Take 2 tablets by mouth once a day on the day after chemotherapy and then take 2 tablets two times a day for 2 days. Take with food. 30 tablet 1  . levothyroxine (SYNTHROID, LEVOTHROID) 50 MCG tablet Take 50 mcg by mouth daily before breakfast.    . lidocaine-prilocaine (EMLA) cream Apply 1 application topically as needed. 30 g 6  . loratadine (CLARITIN) 10 MG tablet Take 10 mg by mouth daily.    Marland Kitchen LORazepam (ATIVAN) 0.5 MG tablet Take 1  tablet (0.5 mg total) by mouth every 6 (six) hours as needed (Nausea or vomiting). 30 tablet 0  . MAGNESIUM GLYCINATE PLUS PO Take 100 mg by mouth.    . Melatonin 5 MG TABS Take 5 mg by mouth at bedtime as needed (for sleep.).    Marland Kitchen metFORMIN (GLUCOPHAGE) 1000 MG tablet Take 1,000 mg by mouth 2 (two) times daily.    Marland Kitchen nystatin (MYCOSTATIN) 100000 UNIT/ML suspension Take 5 mLs (500,000 Units total) by mouth 4 (four) times daily. 240 mL 0  . ondansetron (ZOFRAN) 8 MG tablet Take 1 tablet (8 mg total) by mouth 2 (two) times daily as needed. Start on the third day after chemotherapy. 30 tablet 1  . quinapril (ACCUPRIL) 5 MG tablet Take 5 mg by mouth every morning.     . sodium bicarbonate 650 MG tablet Take 1,300 mg by mouth at bedtime.    Marland Kitchen aspirin-acetaminophen-caffeine (EXCEDRIN MIGRAINE) 250-250-65 MG tablet Take 2 tablets by mouth every 6 (six) hours as needed for headache.     No current facility-administered medications for this visit.     Review of Systems:  GENERAL:  Feels "ok".  No fevers, sweats.  Weight down 2 pounds. PERFORMANCE STATUS (ECOG):  1 HEENT:  No visual changes, runny nose, sore throat, mouth sores or tenderness. Lungs: No shortness of breath or cough.  No hemoptysis. Cardiac:  No chest pain, palpitations, orthopnea, or PND. GI:  Swallowing issues (see HPI).  Reflux.  Constipation.  No nausea, vomiting, diarrhea, melena or hematochezia. GU:  No urgency, frequency, dysuria, or hematuria. Musculoskeletal:  No back pain.  No joint pain.  No muscle tenderness. Extremities:  No pain or swelling. Skin:  No rashes or skin changes. Neuro:  No headache, numbness or weakness, balance or coordination issues. Endocrine:  No diabetes, thyroid issues, hot flashes or night sweats. Psych:  No mood changes, depression or anxiety.  Trouble sleeping at night. Pain:  No focal pain. Review of systems:  All other systems reviewed and found to be negative.   Physical Exam: Blood pressure  (!) 141/83, pulse 73, temperature (!) 97.5 F (36.4 C), temperature source Tympanic, resp. rate 18, weight 144 lb 1.6 oz (65.4 kg). GENERAL:  Well developed, well nourished, woman sitting comfortably in the exam room in no acute distress. MENTAL STATUS:  Alert and oriented to person, place and time. HEAD:  Wearing a black scarf.  Normocephalic, atraumatic, face symmetric, no Cushingoid features. EYES:  Glasses.  Blue eyes.  Pupils equal round and reactive to light and accomodation.  No conjunctivitis or scleral icterus. ENT:  Oropharynx clear without lesion.  Tongue normal. Mucous membranes moist.  RESPIRATORY:  Clear to auscultation without rales, wheezes or rhonchi. CARDIOVASCULAR:  Regular rate and rhythm without murmur, rub  or gallop. ABDOMEN:  Soft, non-tender, with active bowel sounds, and no hepatosplenomegaly.  No masses. SKIN:  Back well healed.  No rashes, ulcers or lesions. EXTREMITIES: No edema, no skin discoloration or tenderness.  No palpable cords. LYMPH NODES: No palpable cervical, supraclavicular, axillary or inguinal adenopathy  NEUROLOGICAL: Unremarkable. PSYCH:  Appropriate.    Infusion on 09/08/2017  Component Date Value Ref Range Status  . Magnesium 09/08/2017 2.0  1.7 - 2.4 mg/dL Final   Performed at [facility], [address], [place], [place] [address]  . Sodium 09/08/2017 139  135 - 145 mmol/L Final  . Potassium 09/08/2017 3.5  3.5 - 5.1 mmol/L Final  . Chloride 09/08/2017 105  101 - 111 mmol/L Final  . CO2 09/08/2017 23  22 - 32 mmol/L Final  . Glucose, Bld 09/08/2017 127* 65 - 99 mg/dL Final  . BUN 09/08/2017 12  6 - 20 mg/dL Final  . Creatinine, Ser 09/08/2017 0.66  0.44 - 1.00 mg/dL Final  . Calcium 09/08/2017 9.1  8.9 - 10.3 mg/dL Final  . Total Protein 09/08/2017 6.8  6.5 - 8.1 g/dL Final  . Albumin 09/08/2017 4.2  3.5 - 5.0 g/dL Final  . AST 09/08/2017 19  15 - 41 U/L Final  . ALT 09/08/2017 22  14 - 54 U/L Final  . Alkaline Phosphatase  09/08/2017 37* 38 - 126 U/L Final  . Total Bilirubin 09/08/2017 0.8  0.3 - 1.2 mg/dL Final  . GFR calc non Af Amer 09/08/2017 >60  >60 mL/min Final  . GFR calc Af Amer 09/08/2017 >60  >60 mL/min Final   Comment: (NOTE) The eGFR has been calculated using the CKD EPI equation. This calculation has not been validated in all clinical situations. eGFR's persistently <60 mL/min signify possible Chronic Kidney Disease.   Georgiann Hahn gap 09/08/2017 11  5 - 15 Final   Performed at [facility], [address] [place], [place] [address]  . WBC 09/08/2017 4.1  3.6 - 11.0 K/uL Final  . RBC 09/08/2017 3.35* 3.80 - 5.20 MIL/uL Final  . Hemoglobin 09/08/2017 10.9* 12.0 - 16.0 g/dL Final  . HCT 09/08/2017 30.6* 35.0 - 47.0 % Final  . MCV 09/08/2017 91.3  80.0 - 100.0 fL Final  . MCH 09/08/2017 32.5  26.0 - 34.0 pg Final  . MCHC 09/08/2017 35.6  32.0 - 36.0 g/dL Final  . RDW 09/08/2017 18.1* 11.5 - 14.5 % Final  . Platelets 09/08/2017 220  150 - 440 K/uL Final  . Neutrophils Relative % 09/08/2017 63  % Final  . Neutro Abs 09/08/2017 2.6  1.4 - 6.5 K/uL Final  . Lymphocytes Relative 09/08/2017 13  % Final  . Lymphs Abs 09/08/2017 0.5* 1.0 - 3.6 K/uL Final  . Monocytes Relative 09/08/2017 21  % Final  . Monocytes Absolute 09/08/2017 0.9  0.2 - 0.9 K/uL Final  . Eosinophils Relative 09/08/2017 1  % Final  . Eosinophils Absolute 09/08/2017 0.0  0 - 0.7 K/uL Final  . Basophils Relative 09/08/2017 2  % Final  . Basophils Absolute 09/08/2017 0.1  0 - 0.1 K/uL Final   Performed at [facility], [address] [place], [place] [address]    Assessment:  AKANSHA WYCHE is a 72 y.o. 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  Pathology revealed two grade II invasive mammary carcinomas of no special type (4 cm from the nipple and 5 cm from the nipple).  Tumors were in close proximity.  Lymph node biopsy confirmed metastatic carcinoma  of breast origin.  Tumor is ER negative (< 1%), PR negative and Her 2/neu 2+ by IHC.  [facility] genetic testing on 07/21/2017 revealed a variant of uncertain significance in RECQL4.  Right sided mammogram and ultrasound on 05/21/2017 revealed 2 highly suspicious masses in the right breast at the 10:30 position 4 cm from nipple (1.8 x 1.2 x 1.1 cm) and at the 10:30 position 5 cm from nipple (1.1 x 0.9 x 0.9 cm).  There was a suspicious 2.7 x 1 x 2.2 cm lymph node in the right axilla with asymmetric nodular cortical thickening.  Ultrasound guided biopsy of the 2 masses and lymph node on 05/28/2017 revealed grade II invasive mammary carcinoma of no special type (4 cm from the nipple and 5 cm from the nipple).  Lymph node biopsy revealed metastatic carcinoma of breast origin.  Tumor is ER negative (< 1%), PR negative and Her 2/neu 2+ by IHC.  FISH was negative.  CA 27.29 was 16.5 and CA15-3 was 17.6 on 06/04/2017.  Echo on 06/09/2017 revealed an EF of 60-65%.  She has enrolled on the UPBEAT clinical trial.  She is s/p 3 cycles of AC (06/23/2017 - 08/18/2017) with Udencya support.  Cycle #1 was complicated by influenza A.  [place] counts included an ANC of 0 and platelet count of 71,000. Cycle #2 postponed due to slow recovery from influenza and excisional cyst removal with surrounding cellulitis (treated with 10 days of doxycycline).   She was admitted to [facility] from 06/28/2017 - 07/01/2017 with with fever, cough and generalized weakness.  Nasal swab was + for influenza A.  She completed a course of Tamiflu.  Symptomatically, she is feeling better overall. She has allergy symptoms (mainly congestion). No fevers. Constipation has has improved. Eating well; no weight loss. Exam is stable.  Back remains well healed without evidence of infection.  WBC is 4100 with an [place] of 2700.  Platelet count is 211,000.  Plan: 1.  Labs today:  CBC with diff, CMP, Mg 2.  Cycle #4 AC today. 3.  Schedule follow-up breast  ultrasound on 09/28/2017. 4.  RTC on 09/09/2017 for Udencya. 5.  RTC on 09/29/2017 for MD assessment, labs (CBC with diff, CMP, Mg), and week #1 Taxol +/- carboplatin.   Lequita Asal, MD 09/08/2017,9:11 AM

## 2017-09-09 ENCOUNTER — Inpatient Hospital Stay: Payer: Medicare Other

## 2017-09-09 DIAGNOSIS — Z5111 Encounter for antineoplastic chemotherapy: Secondary | ICD-10-CM | POA: Diagnosis not present

## 2017-09-09 DIAGNOSIS — C50911 Malignant neoplasm of unspecified site of right female breast: Secondary | ICD-10-CM

## 2017-09-09 DIAGNOSIS — Z171 Estrogen receptor negative status [ER-]: Principal | ICD-10-CM

## 2017-09-09 MED ORDER — PEGFILGRASTIM-CBQV 6 MG/0.6ML ~~LOC~~ SOSY
6.0000 mg | PREFILLED_SYRINGE | Freq: Once | SUBCUTANEOUS | Status: AC
  Administered 2017-09-09: 6 mg via SUBCUTANEOUS
  Filled 2017-09-09: qty 0.6

## 2017-09-23 ENCOUNTER — Ambulatory Visit (HOSPITAL_COMMUNITY)
Admission: RE | Admit: 2017-09-23 | Discharge: 2017-09-23 | Disposition: A | Discharge status: Home / Self Care | Payer: Medicare Other | Source: Ambulatory Visit | Attending: Hematology and Oncology | Admitting: Hematology and Oncology

## 2017-09-23 DIAGNOSIS — Z006 Encounter for examination for normal comparison and control in clinical research program: Secondary | ICD-10-CM

## 2017-09-25 ENCOUNTER — Encounter: Payer: Self-pay | Admitting: *Deleted

## 2017-09-25 DIAGNOSIS — C50911 Malignant neoplasm of unspecified site of right female breast: Secondary | ICD-10-CM

## 2017-09-25 DIAGNOSIS — Z171 Estrogen receptor negative status [ER-]: Secondary | ICD-10-CM

## 2017-09-28 ENCOUNTER — Ambulatory Visit
Admission: RE | Admit: 2017-09-28 | Discharge: 2017-09-28 | Disposition: A | Discharge status: Home / Self Care | Payer: Medicare Other | Source: Ambulatory Visit | Attending: Urgent Care | Admitting: Urgent Care

## 2017-09-28 DIAGNOSIS — Z171 Estrogen receptor negative status [ER-]: Secondary | ICD-10-CM | POA: Diagnosis present

## 2017-09-28 DIAGNOSIS — C50911 Malignant neoplasm of unspecified site of right female breast: Secondary | ICD-10-CM | POA: Diagnosis not present

## 2017-09-28 HISTORY — DX: Personal history of antineoplastic chemotherapy: Z92.21

## 2017-09-29 ENCOUNTER — Inpatient Hospital Stay: Payer: Medicare Other

## 2017-09-29 ENCOUNTER — Inpatient Hospital Stay: Payer: Medicare Other | Attending: Hematology and Oncology | Admitting: Hematology and Oncology

## 2017-09-29 ENCOUNTER — Other Ambulatory Visit: Payer: Self-pay | Admitting: Hematology and Oncology

## 2017-09-29 ENCOUNTER — Encounter: Payer: Self-pay | Admitting: Hematology and Oncology

## 2017-09-29 ENCOUNTER — Encounter: Payer: Self-pay | Admitting: *Deleted

## 2017-09-29 VITALS — BP 117/76 | HR 86 | Temp 97.0°F | Resp 20 | Ht 68.0 in | Wt 144.1 lb

## 2017-09-29 VITALS — BP 114/75 | HR 80 | Temp 96.3°F | Resp 18

## 2017-09-29 DIAGNOSIS — C50911 Malignant neoplasm of unspecified site of right female breast: Secondary | ICD-10-CM

## 2017-09-29 DIAGNOSIS — C773 Secondary and unspecified malignant neoplasm of axilla and upper limb lymph nodes: Secondary | ICD-10-CM | POA: Insufficient documentation

## 2017-09-29 DIAGNOSIS — Z7189 Other specified counseling: Secondary | ICD-10-CM

## 2017-09-29 DIAGNOSIS — K5900 Constipation, unspecified: Secondary | ICD-10-CM | POA: Diagnosis not present

## 2017-09-29 DIAGNOSIS — Z171 Estrogen receptor negative status [ER-]: Secondary | ICD-10-CM | POA: Diagnosis not present

## 2017-09-29 DIAGNOSIS — Z5111 Encounter for antineoplastic chemotherapy: Secondary | ICD-10-CM | POA: Diagnosis present

## 2017-09-29 DIAGNOSIS — Z006 Encounter for examination for normal comparison and control in clinical research program: Secondary | ICD-10-CM | POA: Insufficient documentation

## 2017-09-29 DIAGNOSIS — C50411 Malignant neoplasm of upper-outer quadrant of right female breast: Secondary | ICD-10-CM | POA: Insufficient documentation

## 2017-09-29 LAB — COMPREHENSIVE METABOLIC PANEL
ALT: 22 U/L (ref 14–54)
AST: 22 U/L (ref 15–41)
Albumin: 4.4 g/dL (ref 3.5–5.0)
Alkaline Phosphatase: 43 U/L (ref 38–126)
Anion gap: 8 (ref 5–15)
BUN: 11 mg/dL (ref 6–20)
CO2: 26 mmol/L (ref 22–32)
Calcium: 9.4 mg/dL (ref 8.9–10.3)
Chloride: 103 mmol/L (ref 101–111)
Creatinine, Ser: 0.78 mg/dL (ref 0.44–1.00)
GFR calc Af Amer: >60 mL/min (ref >60)
GFR calc non Af Amer: >60 mL/min (ref >60)
Glucose, Bld: 146 mg/dL — ABNORMAL HIGH (ref 65–99)
Potassium: 3.6 mmol/L (ref 3.5–5.1)
Sodium: 137 mmol/L (ref 135–145)
Total Bilirubin: 0.8 mg/dL (ref 0.3–1.2)
Total Protein: 6.8 g/dL (ref 6.5–8.1)

## 2017-09-29 LAB — CBC WITH DIFFERENTIAL/PLATELET
Basophils Absolute: 0.1 10*3/uL (ref 0–0.1)
Basophils Relative: 2 %
Eosinophils Absolute: 0.1 10*3/uL (ref 0–0.7)
Eosinophils Relative: 2 %
HCT: 31 % — ABNORMAL LOW (ref 35.0–47.0)
Hemoglobin: 10.8 g/dL — ABNORMAL LOW (ref 12.0–16.0)
Lymphocytes Relative: 15 %
Lymphs Abs: 0.5 10*3/uL — ABNORMAL LOW (ref 1.0–3.6)
MCH: 32.6 pg (ref 26.0–34.0)
MCHC: 34.7 g/dL (ref 32.0–36.0)
MCV: 94 fL (ref 80.0–100.0)
Monocytes Absolute: 0.7 10*3/uL (ref 0.2–0.9)
Monocytes Relative: 22 %
Neutro Abs: 2 10*3/uL (ref 1.4–6.5)
Neutrophils Relative %: 59 %
Platelets: 187 10*3/uL (ref 150–440)
RBC: 3.3 MIL/uL — ABNORMAL LOW (ref 3.80–5.20)
RDW: 19.6 % — ABNORMAL HIGH (ref 11.5–14.5)
WBC: 3.4 10*3/uL — ABNORMAL LOW (ref 3.6–11.0)

## 2017-09-29 LAB — MAGNESIUM: Magnesium: 2 mg/dL (ref 1.7–2.4)

## 2017-09-29 MED ORDER — HEPARIN SOD (PORK) LOCK FLUSH 100 UNIT/ML IV SOLN
500.0000 [IU] | Freq: Once | INTRAVENOUS | Status: AC
  Administered 2017-09-29: 500 [IU] via INTRAVENOUS
  Filled 2017-09-29: qty 5

## 2017-09-29 MED ORDER — FAMOTIDINE IN NACL 20-0.9 MG/50ML-% IV SOLN
20.0000 mg | Freq: Once | INTRAVENOUS | Status: AC
  Administered 2017-09-29: 20 mg via INTRAVENOUS
  Filled 2017-09-29: qty 50

## 2017-09-29 MED ORDER — SODIUM CHLORIDE 0.9% FLUSH
10.0000 mL | INTRAVENOUS | Status: DC | PRN
  Filled 2017-09-29: qty 10

## 2017-09-29 MED ORDER — SODIUM CHLORIDE 0.9 % IV SOLN
Freq: Once | INTRAVENOUS | Status: AC
  Administered 2017-09-29: 10:00:00 via INTRAVENOUS
  Filled 2017-09-29: qty 1000

## 2017-09-29 MED ORDER — SODIUM CHLORIDE 0.9 % IV SOLN
80.0000 mg/m2 | Freq: Once | INTRAVENOUS | Status: AC
  Administered 2017-09-29: 150 mg via INTRAVENOUS
  Filled 2017-09-29: qty 25

## 2017-09-29 MED ORDER — SODIUM CHLORIDE 0.9 % IV SOLN
Freq: Once | INTRAVENOUS | Status: AC
  Administered 2017-09-29: 11:00:00 via INTRAVENOUS
  Filled 2017-09-29: qty 5

## 2017-09-29 MED ORDER — SODIUM CHLORIDE 0.9% FLUSH
10.0000 mL | Freq: Once | INTRAVENOUS | Status: AC
  Administered 2017-09-29: 10 mL via INTRAVENOUS
  Filled 2017-09-29: qty 10

## 2017-09-29 MED ORDER — DIPHENHYDRAMINE HCL 50 MG/ML IJ SOLN
50.0000 mg | Freq: Once | INTRAMUSCULAR | Status: AC
  Administered 2017-09-29: 50 mg via INTRAVENOUS
  Filled 2017-09-29: qty 1

## 2017-09-29 MED ORDER — HEPARIN SOD (PORK) LOCK FLUSH 100 UNIT/ML IV SOLN: 500.0000 [IU] | Freq: Once | INTRAVENOUS | Status: DC | PRN

## 2017-09-29 MED ORDER — PALONOSETRON HCL INJECTION 0.25 MG/5ML
0.2500 mg | Freq: Once | INTRAVENOUS | Status: AC
  Administered 2017-09-29: 0.25 mg via INTRAVENOUS
  Filled 2017-09-29: qty 5

## 2017-09-29 NOTE — Progress Notes (Signed)
[facility] day:  09/29/2017   Chief Complaint: Stephanie Sanders is a 72 y.o. female with multi-focal right breast cancer who is seen for assessment prior to cycle #1 Taxol +/- carboplatin.  HPI:  The patient was last seen in the medical oncology clinic on 09/08/2017.  At that time, she was feeling better overall. She had allergy symptoms (mainly congestion). She denied fevers. Constipation had improved. She was eating well with no weight loss. Exam was stable.  WBC was 4100 with an [place] of 2700.  Platelet count was 211,000.  She received cycle #4 AC with Udencya support..  Right mammogram and ultrasound on 09/28/2017 revealed a positive response to neoadjuvant chemotherapy. The 2 adjacent masses in the right breast at the 10:30 o'clock position had both decreased in size (1.8 x 1.1 x 1.2 cm to 1.2 x 0.7 x 1.1 cm; 11 x 9 x 9 mm to 7 x 6 x 6 mm) as well as the metastatic right axillary lymph node (2 .7 cm to 1.6 x 0.9 x 1.7 cm).  There was a benign cystic skin lesion of the right anterior chest, medial to the right breast, likely an inclusion cyst.  During the interim, patient is doing well. She tolerated the last cycle of chemotherapy fairly well. She notes that she has been more fatigued with this cycles as compaired to previous cycles. Patient denies that she has experienced any B symptoms. She denies any interval infections. She denies nausea, vomiting, and changes to her bowel habits.  Patient does not verbalize any concerns with regards to her breasts today. Patient  does perform monthly self breast examinations as recommended.   Patient maintains an adequate appetite, and notes that she is eating well. Weight, compared to her last visit to the clinic, has remained stable.   Patient denies pain in the clinic today.   Past Medical History:  Diagnosis Date  . Cancer ([place])   . Diabetes mellitus without complication ([place])   . Diabetic nephropathy  ([place])   . Headache    migraines  . Heart murmur    ASYMPTOMATIC  . Hypertension   . Hypothyroidism   . Personal history of chemotherapy 2019   right breast cancer  . Seborrheic keratosis   . Vitamin D deficiency     Past Surgical History:  Procedure Laterality Date  . APPENDECTOMY    . BREAST BIOPSY Right 05/28/2017   right breast bx 3 areas invasive mamm ca lymph node mets  . BREAST CYST ASPIRATION Bilateral    neg  . CHOLECYSTECTOMY    . COLONOSCOPY WITH PROPOFOL N/A 01/01/2015   Procedure: COLONOSCOPY WITH PROPOFOL;  Surgeon: Josefine Class, MD;  Location: [facility] ENDOSCOPY;  Service: Endoscopy;  Laterality: N/A;  . EYE SURGERY     eyelid  . PORTACATH PLACEMENT Right 06/10/2017   Procedure: INSERTION PORT-A-CATH;  Surgeon: Herbert Pun, MD;  Location: [facility] ORS;  Service: General;  Laterality: Right;  . STENT PLACEMENT ILIAC ([place] HX)    . TUBAL LIGATION      Family History  Problem Relation Age of Onset  . Cancer Mother   . Cancer Maternal Aunt   . Breast cancer Neg Hx     Social History:  reports that she quit smoking about 45 years ago. Her smoking use included cigarettes. She has a 20.00 pack-year smoking history. She has never used smokeless tobacco. She reports that she does not drink alcohol or use drugs.  She has 3 children (daughters: age 95 and 7; son: age 62).  She is retired.  She worked for the [employer].  Her husband has dementia.  She lives in [place].    Allergies:  Allergies  Allergen Reactions  . Fiorinal [Butalbital-Aspirin-Caffeine] Nausea And Vomiting and Other (See Comments)    SEVERE HEADACHE  . Sulfa Antibiotics Other (See Comments)    Burning esophagus and stomach  . Tramadol Nausea And Vomiting  . Other Rash    STERI-STRIPS-RASH STERI-STRIPS-RASH    Current Medications: Current Outpatient Medications  Medication Sig Dispense Refill  . acetaminophen (TYLENOL) 500 MG tablet Take 1,000 mg by mouth every 6 (six) hours as needed  (for headaches.).    Marland Kitchen atorvastatin (LIPITOR) 10 MG tablet Take 10 mg by mouth every evening.     . Cholecalciferol 2000 UNITS CAPS Take 4,000 Units by mouth daily.     . colesevelam (WELCHOL) 625 MG tablet Take 1,875 mg by mouth every evening.     Marland Kitchen dexamethasone (DECADRON) 4 MG tablet Take 2 tablets by mouth once a day on the day after chemotherapy and then take 2 tablets two times a day for 2 days. Take with food. 30 tablet 1  . levothyroxine (SYNTHROID, LEVOTHROID) 50 MCG tablet Take 50 mcg by mouth daily before breakfast.    . lidocaine-prilocaine (EMLA) cream Apply 1 application topically as needed. 30 g 6  . loratadine (CLARITIN) 10 MG tablet Take 10 mg by mouth daily.    Marland Kitchen LORazepam (ATIVAN) 0.5 MG tablet Take 1 tablet (0.5 mg total) by mouth every 6 (six) hours as needed (Nausea or vomiting). 30 tablet 0  . MAGNESIUM GLYCINATE PLUS PO Take 100 mg by mouth.    . Melatonin 5 MG TABS Take 5 mg by mouth at bedtime as needed (for sleep.).    Marland Kitchen metFORMIN (GLUCOPHAGE) 1000 MG tablet Take 1,000 mg by mouth 2 (two) times daily.    . ondansetron (ZOFRAN) 8 MG tablet Take 1 tablet (8 mg total) by mouth 2 (two) times daily as needed. Start on the third day after chemotherapy. 30 tablet 1  . quinapril (ACCUPRIL) 5 MG tablet Take 5 mg by mouth every morning.     . sodium bicarbonate 650 MG tablet Take 1,300 mg by mouth at bedtime.    Marland Kitchen aspirin-acetaminophen-caffeine (EXCEDRIN MIGRAINE) 250-250-65 MG tablet Take 2 tablets by mouth every 6 (six) hours as needed for headache.    . nystatin (MYCOSTATIN) 100000 UNIT/ML suspension Take 5 mLs (500,000 Units total) by mouth 4 (four) times daily. (Patient not taking: Reported on 09/29/2017) 240 mL 0   No current facility-administered medications for this visit.    Facility-Administered Medications Ordered in Other Visits  Medication Dose Route Frequency Provider Last Rate Last Dose  . heparin lock flush 100 unit/mL  500 Units Intravenous Once Lequita Asal, MD        Review of Systems  Constitutional: Positive for malaise/fatigue. Negative for diaphoresis, fever and weight loss (stable).       "Feels better".   HENT: Negative.   Eyes: Negative.   Respiratory: Negative for cough, hemoptysis, sputum production and shortness of breath.   Cardiovascular: Negative for chest pain, palpitations, orthopnea, leg swelling and PND.  Gastrointestinal: Positive for constipation and heartburn (on PPI). Negative for abdominal pain, blood in stool, diarrhea, melena, nausea and vomiting.  Genitourinary: Negative for dysuria, frequency, hematuria and urgency.  Musculoskeletal: Negative for back pain, falls, joint pain and myalgias.  Skin: Negative for itching and rash.  Neurological: Negative for dizziness, tremors, weakness and headaches.  Endo/Heme/Allergies: Does not bruise/bleed easily.  Psychiatric/Behavioral: Negative for depression, memory loss and suicidal ideas. The patient is not nervous/anxious and does not have insomnia.   All other systems reviewed and are negative.  Performance status (ECOG): 1 - Symptomatic but completely ambulatory   Physical Exam: Blood pressure 117/76, pulse 86, temperature (!) 97 F (36.1 C), temperature source Tympanic, resp. rate 20, height _0  (1.727 m), weight 144 lb 1.6 oz (65.4 kg). GENERAL:  Well developed, well nourished, woman sitting comfortably in the exam room in no acute distress. MENTAL STATUS:  Alert and oriented to person, place and time. HEAD:  Gray/silver wig.  Normocephalic, atraumatic, face symmetric, no Cushingoid features. EYES:  Glasses.  Blue eyes.  Pupils equal round and reactive to light and accomodation.  No conjunctivitis or scleral icterus. ENT:  Oropharynx clear without lesion.  Tongue normal. Mucous membranes moist.  RESPIRATORY:  Clear to auscultation without rales, wheezes or rhonchi. CARDIOVASCULAR:  Regular rate and rhythm without murmur, rub or gallop. ABDOMEN:  Soft,  non-tender, with active bowel sounds, and no hepatosplenomegaly.  No masses. SKIN:  No rashes, ulcers or lesions. EXTREMITIES: No edema, no skin discoloration or tenderness.  No palpable cords. LYMPH NODES: No palpable cervical, supraclavicular, axillary or inguinal adenopathy  NEUROLOGICAL: Unremarkable. PSYCH:  Appropriate.    Infusion on 09/29/2017  Component Date Value Ref Range Status  . Magnesium 09/29/2017 2.0  1.7 - 2.4 mg/dL Final   Performed at [facility], [address] [place], [place] [address]  . Sodium 09/29/2017 137  135 - 145 mmol/L Final  . Potassium 09/29/2017 3.6  3.5 - 5.1 mmol/L Final  . Chloride 09/29/2017 103  101 - 111 mmol/L Final  . CO2 09/29/2017 26  22 - 32 mmol/L Final  . Glucose, Bld 09/29/2017 146* 65 - 99 mg/dL Final  . BUN 09/29/2017 11  6 - 20 mg/dL Final  . Creatinine, Ser 09/29/2017 0.78  0.44 - 1.00 mg/dL Final  . Calcium 09/29/2017 9.4  8.9 - 10.3 mg/dL Final  . Total Protein 09/29/2017 6.8  6.5 - 8.1 g/dL Final  . Albumin 09/29/2017 4.4  3.5 - 5.0 g/dL Final  . AST 09/29/2017 22  15 - 41 U/L Final  . ALT 09/29/2017 22  14 - 54 U/L Final  . Alkaline Phosphatase 09/29/2017 43  38 - 126 U/L Final  . Total Bilirubin 09/29/2017 0.8  0.3 - 1.2 mg/dL Final  . GFR calc non Af Amer 09/29/2017 >60  >60 mL/min Final  . GFR calc Af Amer 09/29/2017 >60  >60 mL/min Final   Comment: (NOTE) The eGFR has been calculated using the CKD EPI equation. This calculation has not been validated in all clinical situations. eGFR's persistently <60 mL/min signify possible Chronic Kidney Disease.   Georgiann Hahn gap 09/29/2017 8  5 - 15 Final   Performed at [facility], [address] [place], [place] [address]  . WBC 09/29/2017 3.4* 3.6 - 11.0 K/uL Final  . RBC 09/29/2017 3.30* 3.80 - 5.20 MIL/uL Final  . Hemoglobin 09/29/2017 10.8* 12.0 - 16.0 g/dL Final  . HCT 09/29/2017 31.0* 35.0 - 47.0 % Final  . MCV 09/29/2017 94.0  80.0 - 100.0 fL Final  . MCH  09/29/2017 32.6  26.0 - 34.0 pg Final  . MCHC 09/29/2017 34.7  32.0 - 36.0 g/dL Final  . RDW 09/29/2017 19.6* 11.5 -  14.5 % Final  . Platelets 09/29/2017 187  150 - 440 K/uL Final  . Neutrophils Relative % 09/29/2017 59  % Final  . Neutro Abs 09/29/2017 2.0  1.4 - 6.5 K/uL Final  . Lymphocytes Relative 09/29/2017 15  % Final  . Lymphs Abs 09/29/2017 0.5* 1.0 - 3.6 K/uL Final  . Monocytes Relative 09/29/2017 22  % Final  . Monocytes Absolute 09/29/2017 0.7  0.2 - 0.9 K/uL Final  . Eosinophils Relative 09/29/2017 2  % Final  . Eosinophils Absolute 09/29/2017 0.1  0 - 0.7 K/uL Final  . Basophils Relative 09/29/2017 2  % Final  . Basophils Absolute 09/29/2017 0.1  0 - 0.1 K/uL Final   Performed at [facility], [address], [place], [place] [address]    Assessment:  AZORIA ABBETT is a 72 y.o. 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  Pathology revealed two grade II invasive mammary carcinomas of no special type (4 cm from the nipple and 5 cm from the nipple).  Tumors were in close proximity.  Lymph node biopsy confirmed metastatic carcinoma of breast origin.  Tumor is ER negative (< 1%), PR negative and Her 2/neu 2+ by IHC.  [facility] genetic testing on 07/21/2017 revealed a variant of uncertain significance in RECQL4.  Right sided mammogram and ultrasound on 05/21/2017 revealed 2 highly suspicious masses in the right breast at the 10:30 position 4 cm from nipple (1.8 x 1.2 x 1.1 cm) and at the 10:30 position 5 cm from nipple (1.1 x 0.9 x 0.9 cm).  There was a suspicious 2.7 x 1 x 2.2 cm lymph node in the right axilla with asymmetric nodular cortical thickening.  Ultrasound guided biopsy of the 2 masses and lymph node on 05/28/2017 revealed grade II invasive mammary carcinoma of no special type (4 cm from the nipple and 5 cm from the nipple).  Lymph node biopsy revealed metastatic carcinoma of breast origin.  Tumor is ER  negative (< 1%), PR negative and Her 2/neu 2+ by IHC.  FISH was negative.  CA 27.29 was 16.5 and CA15-3 was 17.6 on 06/04/2017.  Echo on 06/09/2017 revealed an EF of 60-65%.  She has enrolled on the UPBEAT clinical trial.  She received 4 cycles of AC (06/23/2017 - 09/08/2017) with Udencya support.  Cycle #1 was complicated by influenza A.  [place] counts included an ANC of 0 and platelet count of 71,000. Cycle #2 postponed due to slow recovery from influenza and excisional cyst removal with surrounding cellulitis (treated with 10 days of doxycycline).   Right mammogram and ultrasound on 09/28/2017 revealed the 2 adjacent masses in the right breast at the 10:30 o'clock position had both decreased in size (1.8 x 1.1 x 1.2 cm to 1.2 x 0.7 x 1.1 cm; 11 x 9 x 9 mm to 7 x 6 x 6 mm) as well as the metastatic right axillary lymph node (2 .7 cm to 1.6 x 0.9 x 1.7 cm).    She was admitted to [facility] from 06/28/2017 - 07/01/2017 with with fever, cough and generalized weakness.  Nasal swab was + for influenza A.  She completed a course of Tamiflu.  Symptomatically, she is feeling better overall. She denies B symptoms. She feels more fatigued as compared to previous cycles. Eating well; no weight loss. Exam is stable.  WBC is 3400 with an [place] of 2000.  Platelet count is 187,000.  Plan: 1. Labs today:  CBC with diff, CMP, Mg 2. Review interval mammogram and ultrasound- decreased size in masses and axillary lymph node. 3. Review plans for continued treatments. Discuss neo-adjuvant use of carboplatin + Taxol versus Taxol alone. Patient has experienced chemotherpay induced neutropenia with previous treatments. Discussed use of GCSF injections in order to support her counts. Patient electing to see how she does with Taxol alone for the first cycle, with plans on adding platinum based agent if counts remain stable. Patient verbalizes her wishes to proceed with treatment today as planned. Cycle #1 Taxol today. 4.  RTC on  10/06/2017 for MD assessment, labs (CBC with diff, CMP, Mg), and week #2 Taxol +/- carboplatin.   Honor Loh, NP 09/29/2017,9:49 AM   I saw and evaluated the patient, participating in the key portions of the service and reviewing pertinent diagnostic studies and records.  I reviewed the nurse practitioner's note and agree with the findings and the plan.  The assessment and plan were discussed with the patient.  Multiple questions were asked by the patient and answered.   Nolon Stalls, MD 09/29/2017,9:49 AM

## 2017-09-29 NOTE — Progress Notes (Signed)
Pt tolerated infusion well. Pt and VS stable at discharge.  

## 2017-09-29 NOTE — Progress Notes (Signed)
Stephanie Sanders returns to clinic this morning for initiation of Cycle1, D1 Weekly Taxol. She also had Central study labs collected for the [place] UPBEAT study. Patient states she is fasting for the labs and has brought her breakfast with her. Patient states she forgot to complete the 3 month study questionnaire and return it this morning, so she went ahead and completed the questionnaire while in clinic. Patient was given a $25 gift card for completing this study time point. Yolande Jolly, BSN, MHA, OCN 09/29/2017 9:55 AM  Central Study labs processed by [facility] lab, then packaged and delivered to main hospital by Lula Olszewski for lab for [facility] to collect. Yolande Jolly, BSN, MHA, OCN 09/29/2017 12:08 PM

## 2017-09-29 NOTE — Progress Notes (Signed)
Patient Stephanie Sanders presents to clinic this morning for purpose of completing her 3 month study visit requirements on the [facility] UPBEAT study. She had her 3 month Cardiac MRI performed on 09/23/17 in [place]. RN assessment completed along with 6 minute walk and Physical Performance Battery for 3 month time-point and 3 month Neurocognitive Battery. Patient verbalized that she was really tired following completion of these assessments and requested to take the self-administered questionnaire home and complete it. She agrees to return it when she comes in for her Taxol infusion on 09/29/17. Patient was given a copy of the questionnaire to take home and complete. Prior to completion of the study procedures, patient's height and weight were measured as follows: weight is 144.0 lbs and height is 68 inches. These measurements were collected after removing shoes and standing straight up with eyes facing forward, as she was assessed at baseline, but not using a stadiometer. Her BMI was electronically calculated at 21.9 today. After resting for 5 minutes, patient's blood pressure was measured at 116/67 and heart rate was 85 bpm. VS rechecked after waiting one minute and b/p was then measured at 108/63 and heart rate 84. These measurements were collected while patient was sitting straight up with feet on the floor and legs uncrossed. Current medications were verified with patient. Adverse events with grade and attribution updated this visit as follows:  Adverse Event Log  Study/Protocol: [id number] Cycle: 3 month Event Grade Onset Date Resolved Date Drug Name Attribution Treatment Comments  Malaise 1 06/24/17   Chemo: [facility]  Not as severe now  Fatigue 1 06/24/17   Chemo: AC  Not as severe, but ongoing  Heartburn 1 09/08/17   Chemo: Evlyn Kanner, BSN, MHA, OCN 09/25/2017  11:25 AM

## 2017-10-06 ENCOUNTER — Encounter: Payer: Self-pay | Admitting: Hematology and Oncology

## 2017-10-06 ENCOUNTER — Inpatient Hospital Stay (HOSPITAL_BASED_OUTPATIENT_CLINIC_OR_DEPARTMENT_OTHER): Payer: Medicare Other | Admitting: Hematology and Oncology

## 2017-10-06 ENCOUNTER — Inpatient Hospital Stay: Payer: Medicare Other

## 2017-10-06 VITALS — BP 129/83 | HR 81 | Temp 98.1°F | Resp 18 | Wt 141.1 lb

## 2017-10-06 DIAGNOSIS — I10 Essential (primary) hypertension: Secondary | ICD-10-CM

## 2017-10-06 DIAGNOSIS — D630 Anemia in neoplastic disease: Secondary | ICD-10-CM | POA: Diagnosis not present

## 2017-10-06 DIAGNOSIS — E119 Type 2 diabetes mellitus without complications: Secondary | ICD-10-CM | POA: Diagnosis not present

## 2017-10-06 DIAGNOSIS — C50411 Malignant neoplasm of upper-outer quadrant of right female breast: Secondary | ICD-10-CM

## 2017-10-06 DIAGNOSIS — R634 Abnormal weight loss: Secondary | ICD-10-CM

## 2017-10-06 DIAGNOSIS — Z87891 Personal history of nicotine dependence: Secondary | ICD-10-CM

## 2017-10-06 DIAGNOSIS — C50911 Malignant neoplasm of unspecified site of right female breast: Secondary | ICD-10-CM

## 2017-10-06 DIAGNOSIS — C773 Secondary and unspecified malignant neoplasm of axilla and upper limb lymph nodes: Secondary | ICD-10-CM | POA: Diagnosis not present

## 2017-10-06 DIAGNOSIS — Z171 Estrogen receptor negative status [ER-]: Secondary | ICD-10-CM | POA: Diagnosis not present

## 2017-10-06 DIAGNOSIS — R5381 Other malaise: Secondary | ICD-10-CM | POA: Diagnosis not present

## 2017-10-06 DIAGNOSIS — R5383 Other fatigue: Secondary | ICD-10-CM | POA: Diagnosis not present

## 2017-10-06 DIAGNOSIS — Z5111 Encounter for antineoplastic chemotherapy: Secondary | ICD-10-CM | POA: Diagnosis not present

## 2017-10-06 DIAGNOSIS — K5900 Constipation, unspecified: Secondary | ICD-10-CM | POA: Diagnosis not present

## 2017-10-06 DIAGNOSIS — C50211 Malignant neoplasm of upper-inner quadrant of right female breast: Secondary | ICD-10-CM

## 2017-10-06 DIAGNOSIS — Z7189 Other specified counseling: Secondary | ICD-10-CM

## 2017-10-06 LAB — CBC WITH DIFFERENTIAL/PLATELET
Basophils Absolute: 0.1 10*3/uL (ref 0–0.1)
Basophils Relative: 2 %
Eosinophils Absolute: 0.2 10*3/uL (ref 0–0.7)
Eosinophils Relative: 5 %
HCT: 28.7 % — ABNORMAL LOW (ref 35.0–47.0)
Hemoglobin: 10.3 g/dL — ABNORMAL LOW (ref 12.0–16.0)
Lymphocytes Relative: 15 %
Lymphs Abs: 0.5 10*3/uL — ABNORMAL LOW (ref 1.0–3.6)
MCH: 33.9 pg (ref 26.0–34.0)
MCHC: 35.8 g/dL (ref 32.0–36.0)
MCV: 94.5 fL (ref 80.0–100.0)
Monocytes Absolute: 0.4 10*3/uL (ref 0.2–0.9)
Monocytes Relative: 11 %
Neutro Abs: 2.4 10*3/uL (ref 1.4–6.5)
Neutrophils Relative %: 67 %
Platelets: 146 10*3/uL — ABNORMAL LOW (ref 150–440)
RBC: 3.04 MIL/uL — ABNORMAL LOW (ref 3.80–5.20)
RDW: 18.5 % — ABNORMAL HIGH (ref 11.5–14.5)
WBC: 3.6 10*3/uL (ref 3.6–11.0)

## 2017-10-06 LAB — COMPREHENSIVE METABOLIC PANEL
ALT: 21 U/L (ref 14–54)
AST: 23 U/L (ref 15–41)
Albumin: 4 g/dL (ref 3.5–5.0)
Alkaline Phosphatase: 40 U/L (ref 38–126)
Anion gap: 8 (ref 5–15)
BUN: 10 mg/dL (ref 6–20)
CO2: 25 mmol/L (ref 22–32)
Calcium: 9.2 mg/dL (ref 8.9–10.3)
Chloride: 105 mmol/L (ref 101–111)
Creatinine, Ser: 0.7 mg/dL (ref 0.44–1.00)
GFR calc Af Amer: >60 mL/min (ref >60)
GFR calc non Af Amer: >60 mL/min (ref >60)
Glucose, Bld: 161 mg/dL — ABNORMAL HIGH (ref 65–99)
Potassium: 3.6 mmol/L (ref 3.5–5.1)
Sodium: 138 mmol/L (ref 135–145)
Total Bilirubin: 0.6 mg/dL (ref 0.3–1.2)
Total Protein: 6.2 g/dL — ABNORMAL LOW (ref 6.5–8.1)

## 2017-10-06 LAB — MAGNESIUM: Magnesium: 1.9 mg/dL (ref 1.7–2.4)

## 2017-10-06 MED ORDER — SODIUM CHLORIDE 0.9 % IV SOLN
Freq: Once | INTRAVENOUS | Status: AC
  Administered 2017-10-06: 10:00:00 via INTRAVENOUS
  Filled 2017-10-06: qty 1000

## 2017-10-06 MED ORDER — SODIUM CHLORIDE 0.9 % IV SOLN
80.0000 mg/m2 | Freq: Once | INTRAVENOUS | Status: AC
  Administered 2017-10-06: 150 mg via INTRAVENOUS
  Filled 2017-10-06: qty 25

## 2017-10-06 MED ORDER — SODIUM CHLORIDE 0.9% FLUSH
10.0000 mL | INTRAVENOUS | Status: DC | PRN
  Administered 2017-10-06: 10 mL via INTRAVENOUS
  Filled 2017-10-06: qty 10

## 2017-10-06 MED ORDER — HEPARIN SOD (PORK) LOCK FLUSH 100 UNIT/ML IV SOLN
500.0000 [IU] | Freq: Once | INTRAVENOUS | Status: AC
  Administered 2017-10-06: 500 [IU] via INTRAVENOUS
  Filled 2017-10-06: qty 5

## 2017-10-06 MED ORDER — SODIUM CHLORIDE 0.9 % IV SOLN
20.0000 mg | Freq: Once | INTRAVENOUS | Status: AC
  Administered 2017-10-06: 20 mg via INTRAVENOUS
  Filled 2017-10-06: qty 2

## 2017-10-06 MED ORDER — DIPHENHYDRAMINE HCL 50 MG/ML IJ SOLN
50.0000 mg | Freq: Once | INTRAMUSCULAR | Status: AC
  Administered 2017-10-06: 50 mg via INTRAVENOUS
  Filled 2017-10-06: qty 1

## 2017-10-06 MED ORDER — FAMOTIDINE IN NACL 20-0.9 MG/50ML-% IV SOLN
20.0000 mg | Freq: Once | INTRAVENOUS | Status: AC
  Administered 2017-10-06: 20 mg via INTRAVENOUS
  Filled 2017-10-06: qty 50

## 2017-10-06 NOTE — Progress Notes (Signed)
[facility] day:  10/06/2017   Chief Complaint: Stephanie Sanders is a 72 y.o. female with multi-focal right breast cancer who is seen for assessment prior to week #2 Taxol +/- carboplatin.  HPI:  The patient was last seen in the medical oncology clinic on 09/29/2017.  At that time, she was feeling better overall. She denied B symptoms. She felt more fatigued as compared to previous cycles. She was eating well; no weight loss. Exam was stable.  WBC was 3400 with an [place] of 2000.  Platelet count was 187,000.  She received week #1 Taxol.  Symptomatically, patient notes that her initial week of Taxol went well overall. She experienced constipation and a decreased appetite. She has lost 3 pounds since her last clinic visit a week ago. She notes that she went 4 days without having a bowel movement. Patient using stool softeners and dulcolax on an as needed basis. Patient denies nausea and vomiting.   Patient denies that she has experienced any B symptoms. She denies any interval infections.   She denies pain in the clinic today.    Past Medical History:  Diagnosis Date  . Cancer ([place])   . Diabetes mellitus without complication ([place])   . Diabetic nephropathy ([place])   . Headache    migraines  . Heart murmur    ASYMPTOMATIC  . Hypertension   . Hypothyroidism   . Personal history of chemotherapy 2019   right breast cancer  . Seborrheic keratosis   . Vitamin D deficiency     Past Surgical History:  Procedure Laterality Date  . APPENDECTOMY    . BREAST BIOPSY Right 05/28/2017   right breast bx 3 areas invasive mamm ca lymph node mets  . BREAST CYST ASPIRATION Bilateral    neg  . CHOLECYSTECTOMY    . COLONOSCOPY WITH PROPOFOL N/A 01/01/2015   Procedure: COLONOSCOPY WITH PROPOFOL;  Surgeon: Josefine Class, MD;  Location[facility] ENDOSCOPY;  Service: Endoscopy;  Laterality: N/A;  . EYE SURGERY     eyelid  . PORTACATH PLACEMENT Right 06/10/2017    Procedure: INSERTION PORT-A-CATH;  Surgeon: Herbert Pun, MD;  Location: [facility] ORS;  Service: General;  Laterality: Right;  . STENT PLACEMENT ILIAC ([place] HX)    . TUBAL LIGATION      Family History  Problem Relation Age of Onset  . Cancer Mother   . Cancer Maternal Aunt   . Breast cancer Neg Hx     Social History:  reports that she quit smoking about 45 years ago. Her smoking use included cigarettes. She has a 20.00 pack-year smoking history. She has never used smokeless tobacco. She reports that she does not drink alcohol or use drugs.  She has 3 children (daughters: age 68 and 22; son: age 20).  She is retired.  She worked for the [employer].  Her husband has dementia.  She lives in [place].    Allergies:  Allergies  Allergen Reactions  . Fiorinal [Butalbital-Aspirin-Caffeine] Nausea And Vomiting and Other (See Comments)    SEVERE HEADACHE  . Sulfa Antibiotics Other (See Comments)    Burning esophagus and stomach  . Tramadol Nausea And Vomiting  . Other Rash    STERI-STRIPS-RASH STERI-STRIPS-RASH    Current Medications: Current Outpatient Medications  Medication Sig Dispense Refill  . acetaminophen (TYLENOL) 500 MG tablet Take 1,000 mg by mouth every 6 (six) hours as needed (for headaches.).    Marland Kitchen aspirin-acetaminophen-caffeine (EXCEDRIN MIGRAINE) 250-250-65 MG tablet  Take 2 tablets by mouth every 6 (six) hours as needed for headache.    Marland Kitchen atorvastatin (LIPITOR) 10 MG tablet Take 10 mg by mouth every evening.     . Cholecalciferol 2000 UNITS CAPS Take 4,000 Units by mouth daily.     Marland Kitchen dexamethasone (DECADRON) 4 MG tablet Take 2 tablets by mouth once a day on the day after chemotherapy and then take 2 tablets two times a day for 2 days. Take with food. 30 tablet 1  . levothyroxine (SYNTHROID, LEVOTHROID) 50 MCG tablet Take 50 mcg by mouth daily before breakfast.    . lidocaine-prilocaine (EMLA) cream Apply 1 application topically as needed. 30 g 6  . loratadine (CLARITIN) 10  MG tablet Take 10 mg by mouth daily.    Marland Kitchen LORazepam (ATIVAN) 0.5 MG tablet Take 1 tablet (0.5 mg total) by mouth every 6 (six) hours as needed (Nausea or vomiting). 30 tablet 0  . MAGNESIUM GLYCINATE PLUS PO Take 100 mg by mouth.    . Melatonin 5 MG TABS Take 5 mg by mouth at bedtime as needed (for sleep.).    Marland Kitchen metFORMIN (GLUCOPHAGE) 1000 MG tablet Take 1,000 mg by mouth 2 (two) times daily.    . ondansetron (ZOFRAN) 8 MG tablet Take 1 tablet (8 mg total) by mouth 2 (two) times daily as needed. Start on the third day after chemotherapy. 30 tablet 1  . quinapril (ACCUPRIL) 5 MG tablet Take 5 mg by mouth every morning.     . sodium bicarbonate 650 MG tablet Take 1,300 mg by mouth at bedtime.    . colesevelam (WELCHOL) 625 MG tablet Take 1,875 mg by mouth every evening.     . nystatin (MYCOSTATIN) 100000 UNIT/ML suspension Take 5 mLs (500,000 Units total) by mouth 4 (four) times daily. (Patient not taking: Reported on 09/29/2017) 240 mL 0   No current facility-administered medications for this visit.    Facility-Administered Medications Ordered in Other Visits  Medication Dose Route Frequency Provider Last Rate Last Dose  . heparin lock flush 100 unit/mL  500 Units Intravenous Once Corcoran, Melissa C, MD      . sodium chloride flush (NS) 0.9 % injection 10 mL  10 mL Intravenous PRN Lequita Asal, MD   10 mL at 10/06/17 2800    Review of Systems  Constitutional: Positive for malaise/fatigue and weight loss (weight down 3 pounds - appetite decreased). Negative for diaphoresis and fever.       "I feel ok. The Taxol was much better than the [facility]".  HENT: Negative.   Eyes: Negative.   Respiratory: Negative for cough, hemoptysis, sputum production and shortness of breath.   Cardiovascular: Negative for chest pain, palpitations, orthopnea, leg swelling and PND.  Gastrointestinal: Positive for constipation and heartburn. Negative for abdominal pain, blood in stool, diarrhea, melena, nausea and  vomiting.  Genitourinary: Negative for dysuria, frequency, hematuria and urgency.  Musculoskeletal: Negative for back pain, falls, joint pain and myalgias.  Skin: Negative for itching and rash.  Neurological: Negative for dizziness, tremors, weakness and headaches.  Endo/Heme/Allergies: Does not bruise/bleed easily.       Diabetes - on Metformin  Psychiatric/Behavioral: Negative for depression, memory loss and suicidal ideas. The patient is not nervous/anxious and does not have insomnia.   All other systems reviewed and are negative.  Performance status (ECOG): 1 - Symptomatic but completely ambulatory   Physical Exam: Blood pressure 129/83, pulse 81, temperature 98.1 F (36.7 C), temperature source Tympanic, resp. rate  18, weight 141 lb 1 oz (64 kg). GENERAL:  Well developed, well nourished, woman sitting comfortably in the exam room in no acute distress. MENTAL STATUS:  Alert and oriented to person, place and time. HEAD:  Wearing a silver wig.  Normocephalic, atraumatic, face symmetric, no Cushingoid features. EYES:  Blue eyes.  Pupils equal round and reactive to light and accomodation.  No conjunctivitis or scleral icterus. ENT:  Oropharynx clear without lesion.  Tongue normal. Mucous membranes moist.  RESPIRATORY:  Clear to auscultation without rales, wheezes or rhonchi. CARDIOVASCULAR:  Regular rate and rhythm without murmur, rub or gallop. ABDOMEN:  Soft, non-tender, with active bowel sounds, and no hepatosplenomegaly.  No masses. SKIN:  Back remains well healed.  Anterior small cystic chest lesion intact.  No rashes, ulcers or lesions. EXTREMITIES: No edema, no skin discoloration or tenderness.  No palpable cords. LYMPH NODES: No palpable cervical, supraclavicular, axillary or inguinal adenopathy  NEUROLOGICAL: Unremarkable. PSYCH:  Appropriate.    Infusion on 10/06/2017  Component Date Value Ref Range Status  . Sodium 10/06/2017 138  135 - 145 mmol/L Final  . Potassium  10/06/2017 3.6  3.5 - 5.1 mmol/L Final  . Chloride 10/06/2017 105  101 - 111 mmol/L Final  . CO2 10/06/2017 25  22 - 32 mmol/L Final  . Glucose, Bld 10/06/2017 161* 65 - 99 mg/dL Final  . BUN 10/06/2017 10  6 - 20 mg/dL Final  . Creatinine, Ser 10/06/2017 0.70  0.44 - 1.00 mg/dL Final  . Calcium 10/06/2017 9.2  8.9 - 10.3 mg/dL Final  . Total Protein 10/06/2017 6.2* 6.5 - 8.1 g/dL Final  . Albumin 10/06/2017 4.0  3.5 - 5.0 g/dL Final  . AST 10/06/2017 23  15 - 41 U/L Final  . ALT 10/06/2017 21  14 - 54 U/L Final  . Alkaline Phosphatase 10/06/2017 40  38 - 126 U/L Final  . Total Bilirubin 10/06/2017 0.6  0.3 - 1.2 mg/dL Final  . GFR calc non Af Amer 10/06/2017 >60  >60 mL/min Final  . GFR calc Af Amer 10/06/2017 >60  >60 mL/min Final   Comment: (NOTE) The eGFR has been calculated using the CKD EPI equation. This calculation has not been validated in all clinical situations. eGFR's persistently <60 mL/min signify possible Chronic Kidney Disease.   Georgiann Hahn gap 10/06/2017 8  5 - 15 Final   Performed at [facility], [address] [place], [place] [address]  . WBC 10/06/2017 3.6  3.6 - 11.0 K/uL Final  . RBC 10/06/2017 3.04* 3.80 - 5.20 MIL/uL Final  . Hemoglobin 10/06/2017 10.3* 12.0 - 16.0 g/dL Final  . HCT 10/06/2017 28.7* 35.0 - 47.0 % Final  . MCV 10/06/2017 94.5  80.0 - 100.0 fL Final  . MCH 10/06/2017 33.9  26.0 - 34.0 pg Final  . MCHC 10/06/2017 35.8  32.0 - 36.0 g/dL Final  . RDW 10/06/2017 18.5* 11.5 - 14.5 % Final  . Platelets 10/06/2017 146* 150 - 440 K/uL Final  . Neutrophils Relative % 10/06/2017 67  % Final  . Neutro Abs 10/06/2017 2.4  1.4 - 6.5 K/uL Final  . Lymphocytes Relative 10/06/2017 15  % Final  . Lymphs Abs 10/06/2017 0.5* 1.0 - 3.6 K/uL Final  . Monocytes Relative 10/06/2017 11  % Final  . Monocytes Absolute 10/06/2017 0.4  0.2 - 0.9 K/uL Final  . Eosinophils Relative 10/06/2017 5  % Final  . Eosinophils Absolute 10/06/2017 0.2  0 - 0.7 K/uL Final   .  Basophils Relative 10/06/2017 2  % Final  . Basophils Absolute 10/06/2017 0.1  0 - 0.1 K/uL Final   Performed at [facility], [address] [place], [place] [address]  . Magnesium 10/06/2017 1.9  1.7 - 2.4 mg/dL Final   Performed at [facility], [address] [place], [place] [address]    Assessment:  AVAEH EWER is a 72 y.o. 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  Pathology revealed two grade II invasive mammary carcinomas of no special type (4 cm from the nipple and 5 cm from the nipple).  Tumors were in close proximity.  Lymph node biopsy confirmed metastatic carcinoma of breast origin.  Tumor is ER negative (< 1%), PR negative and Her 2/neu 2+ by IHC.  [facility] genetic testing on 07/21/2017 revealed a variant of uncertain significance in RECQL4.  Right sided mammogram and ultrasound on 05/21/2017 revealed 2 highly suspicious masses in the right breast at the 10:30 position 4 cm from nipple (1.8 x 1.2 x 1.1 cm) and at the 10:30 position 5 cm from nipple (1.1 x 0.9 x 0.9 cm).  There was a suspicious 2.7 x 1 x 2.2 cm lymph node in the right axilla with asymmetric nodular cortical thickening.  Ultrasound guided biopsy of the 2 masses and lymph node on 05/28/2017 revealed grade II invasive mammary carcinoma of no special type (4 cm from the nipple and 5 cm from the nipple).  Lymph node biopsy revealed metastatic carcinoma of breast origin.  Tumor is ER negative (< 1%), PR negative and Her 2/neu 2+ by IHC.  FISH was negative.  CA 27.29 was 16.5 and CA15-3 was 17.6 on 06/04/2017.  Echo on 06/09/2017 revealed an EF of 60-65%.  She has enrolled on the UPBEAT clinical trial.  She received 4 cycles of AC (06/23/2017 - 09/08/2017) with Udencya support.  Cycle #1 was complicated by influenza A.  Nadir counts included an ANC of 0 and platelet count of 71,000. Cycle #2 postponed due to slow recovery from influenza  and excisional cyst removal with surrounding cellulitis (treated with 10 days of doxycycline).   She is s/p week #1 Taxol (09/29/2017).  Right mammogram and ultrasound on 09/28/2017 revealed the 2 adjacent masses in the right breast at the 10:30 o'clock position had both decreased in size (1.8 x 1.1 x 1.2 cm to 1.2 x 0.7 x 1.1 cm; 11 x 9 x 9 mm to 7 x 6 x 6 mm) as well as the metastatic right axillary lymph node (2 .7 cm to 1.6 x 0.9 x 1.7 cm).    She was admitted to [facility] from 06/28/2017 - 07/01/2017 with with fever, cough and generalized weakness.  Nasal swab was + for influenza A.  She completed a course of Tamiflu.  Symptomatically, she is feeling ok overall. She tolerated her first week of Taxol well. She has had constipation. No B symptoms or interval infections. Appetite is decreased. She has lost 3 pounds. Exam is stable.  WBC is 3600 with an ANC of 2400.  Platelet count is 146,000.  Plan: 1. Labs today:  CBC with diff, CMP, Mg. 2. Labs reviewed. Blood counts stable and adequate enough for treatment. Will proceed with week #2 Taxol. Electing to hold off "just one more week" without the addition of the proposed carboplatin.  3. Discuss constipation. Encouraged to use PRN stool softeners and laxatives. Patient to increase fluid and fiber intake.  4. Discuss  nutrition and weight loss. Patient encouraged to increase intake for calorie and protein dense food choices. Will add supplement shakes 2-3 times a day.  5. Discuss symptom management.  Patient has antiemetics and pain medications at home to use on a PRN basis. Patient  advising that the prescribed interventions are adequate at this point. Continue all medications as previously prescribed.  6. RTC on 10/13/2017 for MD assessment, labs (CBC with diff, CMP, Mg), and week #3 Taxol +/- carboplatin.   Honor Loh, NP 10/06/2017,9:18 AM   I saw and evaluated the patient, participating in the key portions of the service and reviewing pertinent  diagnostic studies and records.  I reviewed the nurse practitioner's note and agree with the findings and the plan.  The assessment and plan were discussed with the patient.  Several questions were asked by the patient and answered.   Nolon Stalls, MD 10/06/2017,9:18 AM

## 2017-10-06 NOTE — Progress Notes (Signed)
Pt in for follow up, reports having issue with constipation since treatment.  Pt also reports having "no appetite" has lost 3 lbs since 09/29/17.

## 2017-10-13 ENCOUNTER — Inpatient Hospital Stay (HOSPITAL_BASED_OUTPATIENT_CLINIC_OR_DEPARTMENT_OTHER): Payer: Medicare Other | Admitting: Hematology and Oncology

## 2017-10-13 ENCOUNTER — Inpatient Hospital Stay: Payer: Medicare Other

## 2017-10-13 ENCOUNTER — Other Ambulatory Visit: Payer: Self-pay

## 2017-10-13 ENCOUNTER — Encounter: Payer: Self-pay | Admitting: Hematology and Oncology

## 2017-10-13 VITALS — BP 148/79 | HR 92 | Temp 97.9°F | Resp 18 | Wt 142.9 lb

## 2017-10-13 DIAGNOSIS — K5900 Constipation, unspecified: Secondary | ICD-10-CM

## 2017-10-13 DIAGNOSIS — Z171 Estrogen receptor negative status [ER-]: Secondary | ICD-10-CM

## 2017-10-13 DIAGNOSIS — Z5111 Encounter for antineoplastic chemotherapy: Secondary | ICD-10-CM

## 2017-10-13 DIAGNOSIS — C773 Secondary and unspecified malignant neoplasm of axilla and upper limb lymph nodes: Secondary | ICD-10-CM

## 2017-10-13 DIAGNOSIS — C50411 Malignant neoplasm of upper-outer quadrant of right female breast: Secondary | ICD-10-CM

## 2017-10-13 DIAGNOSIS — C50911 Malignant neoplasm of unspecified site of right female breast: Secondary | ICD-10-CM

## 2017-10-13 DIAGNOSIS — C50211 Malignant neoplasm of upper-inner quadrant of right female breast: Secondary | ICD-10-CM

## 2017-10-13 LAB — CBC WITH DIFFERENTIAL/PLATELET
Basophils Absolute: 0 10*3/uL (ref 0–0.1)
Basophils Relative: 1 %
Eosinophils Absolute: 0.2 10*3/uL (ref 0–0.7)
Eosinophils Relative: 5 %
HCT: 29.2 % — ABNORMAL LOW (ref 35.0–47.0)
Hemoglobin: 10.2 g/dL — ABNORMAL LOW (ref 12.0–16.0)
Lymphocytes Relative: 17 %
Lymphs Abs: 0.5 10*3/uL — ABNORMAL LOW (ref 1.0–3.6)
MCH: 33.8 pg (ref 26.0–34.0)
MCHC: 34.9 g/dL (ref 32.0–36.0)
MCV: 96.7 fL (ref 80.0–100.0)
Monocytes Absolute: 0.3 10*3/uL (ref 0.2–0.9)
Monocytes Relative: 11 %
Neutro Abs: 1.9 10*3/uL (ref 1.4–6.5)
Neutrophils Relative %: 66 %
Platelets: 153 10*3/uL (ref 150–440)
RBC: 3.02 MIL/uL — ABNORMAL LOW (ref 3.80–5.20)
RDW: 18.8 % — ABNORMAL HIGH (ref 11.5–14.5)
WBC: 2.9 10*3/uL — ABNORMAL LOW (ref 3.6–11.0)

## 2017-10-13 LAB — COMPREHENSIVE METABOLIC PANEL
ALT: 25 U/L (ref 0–44)
AST: 30 U/L (ref 15–41)
Albumin: 4.1 g/dL (ref 3.5–5.0)
Alkaline Phosphatase: 39 U/L (ref 38–126)
Anion gap: 10 (ref 5–15)
BUN: 10 mg/dL (ref 8–23)
CO2: 23 mmol/L (ref 22–32)
Calcium: 9.3 mg/dL (ref 8.9–10.3)
Chloride: 103 mmol/L (ref 98–111)
Creatinine, Ser: 0.73 mg/dL (ref 0.44–1.00)
GFR calc Af Amer: >60 mL/min (ref >60)
GFR calc non Af Amer: >60 mL/min (ref >60)
Glucose, Bld: 193 mg/dL — ABNORMAL HIGH (ref 70–99)
Potassium: 3.4 mmol/L — ABNORMAL LOW (ref 3.5–5.1)
Sodium: 136 mmol/L (ref 135–145)
Total Bilirubin: 0.9 mg/dL (ref 0.3–1.2)
Total Protein: 6.6 g/dL (ref 6.5–8.1)

## 2017-10-13 LAB — MAGNESIUM: Magnesium: 2 mg/dL (ref 1.7–2.4)

## 2017-10-13 MED ORDER — FAMOTIDINE IN NACL 20-0.9 MG/50ML-% IV SOLN
20.0000 mg | Freq: Once | INTRAVENOUS | Status: AC
  Administered 2017-10-13: 20 mg via INTRAVENOUS
  Filled 2017-10-13: qty 50

## 2017-10-13 MED ORDER — SODIUM CHLORIDE 0.9 % IV SOLN
80.0000 mg/m2 | Freq: Once | INTRAVENOUS | Status: AC
  Administered 2017-10-13: 150 mg via INTRAVENOUS
  Filled 2017-10-13: qty 25

## 2017-10-13 MED ORDER — DIPHENHYDRAMINE HCL 50 MG/ML IJ SOLN
50.0000 mg | Freq: Once | INTRAMUSCULAR | Status: AC
  Administered 2017-10-13: 50 mg via INTRAVENOUS
  Filled 2017-10-13: qty 1

## 2017-10-13 MED ORDER — HEPARIN SOD (PORK) LOCK FLUSH 100 UNIT/ML IV SOLN
500.0000 [IU] | Freq: Once | INTRAVENOUS | Status: AC | PRN
  Administered 2017-10-13: 500 [IU]
  Filled 2017-10-13: qty 5

## 2017-10-13 MED ORDER — SODIUM CHLORIDE 0.9 % IV SOLN
Freq: Once | INTRAVENOUS | Status: AC
  Administered 2017-10-13: 11:00:00 via INTRAVENOUS
  Filled 2017-10-13: qty 1000

## 2017-10-13 MED ORDER — SODIUM CHLORIDE 0.9 % IV SOLN
20.0000 mg | Freq: Once | INTRAVENOUS | Status: AC
  Administered 2017-10-13: 20 mg via INTRAVENOUS
  Filled 2017-10-13: qty 2

## 2017-10-13 NOTE — Progress Notes (Signed)
[facility] day:  10/13/2017   Chief Complaint: Stephanie Sanders is a 72 y.o. female with multi-focal right breast cancer who is seen for assessment prior to week #3 Taxol +/- carboplatin.  HPI:  The patient was last seen in the medical oncology clinic on 10/06/2017.  At that time, she was feeling ok overall. She had tolerated her first week of Taxol well. She had constipation.  Appetite was decreased. She had lost 3 pounds. Exam was stable.  WBC was 3600 with an ANC of 2400.  Platelet count was 146,000.  She opted to receive Taxol alone.  During the interim, she has felt good.  She is eating better.  Constipation is under control.  She remains on Claritin for a runny nose.  Sometimes her ears pop.   Past Medical History:  Diagnosis Date  . Cancer ([place])   . Diabetes mellitus without complication ([place])   . Diabetic nephropathy ([place])   . Headache    migraines  . Heart murmur    ASYMPTOMATIC  . Hypertension   . Hypothyroidism   . Personal history of chemotherapy 2019   right breast cancer  . Seborrheic keratosis   . Vitamin D deficiency     Past Surgical History:  Procedure Laterality Date  . APPENDECTOMY    . BREAST BIOPSY Right 05/28/2017   right breast bx 3 areas invasive mamm ca lymph node mets  . BREAST CYST ASPIRATION Bilateral    neg  . CHOLECYSTECTOMY    . COLONOSCOPY WITH PROPOFOL N/A 01/01/2015   Procedure: COLONOSCOPY WITH PROPOFOL;  Surgeon: Josefine Class, MD;  Location: [facility] ENDOSCOPY;  Service: Endoscopy;  Laterality: N/A;  . EYE SURGERY     eyelid  . PORTACATH PLACEMENT Right 06/10/2017   Procedure: INSERTION PORT-A-CATH;  Surgeon: Herbert Pun, MD;  Location: [facility] ORS;  Service: General;  Laterality: Right;  . STENT PLACEMENT ILIAC ([place] HX)    . TUBAL LIGATION      Family History  Problem Relation Age of Onset  . Cancer Mother   . Cancer Maternal Aunt   . Breast cancer Neg Hx     Social History:   reports that she quit smoking about 45 years ago. Her smoking use included cigarettes. She has a 20.00 pack-year smoking history. She has never used smokeless tobacco. She reports that she does not drink alcohol or use drugs.  She has 3 children (daughters: age 34 and 8; son: age 31).  She is retired.  She worked for the [employer].  Her husband has dementia.  She lives in [place].  She is alone today.  Allergies:  Allergies  Allergen Reactions  . Fiorinal [Butalbital-Aspirin-Caffeine] Nausea And Vomiting and Other (See Comments)    SEVERE HEADACHE  . Sulfa Antibiotics Other (See Comments)    Burning esophagus and stomach  . Tramadol Nausea And Vomiting  . Other Rash    STERI-STRIPS-RASH STERI-STRIPS-RASH    Current Medications: Current Outpatient Medications  Medication Sig Dispense Refill  . acetaminophen (TYLENOL) 500 MG tablet Take 1,000 mg by mouth every 6 (six) hours as needed (for headaches.).    Marland Kitchen atorvastatin (LIPITOR) 10 MG tablet Take 10 mg by mouth every evening.     . Cholecalciferol 2000 UNITS CAPS Take 4,000 Units by mouth daily.     Marland Kitchen levothyroxine (SYNTHROID, LEVOTHROID) 50 MCG tablet Take 50 mcg by mouth daily before breakfast.    . lidocaine-prilocaine (EMLA) cream Apply 1 application  topically as needed. 30 g 6  . loratadine (CLARITIN) 10 MG tablet Take 10 mg by mouth daily.    Marland Kitchen LORazepam (ATIVAN) 0.5 MG tablet Take 1 tablet (0.5 mg total) by mouth every 6 (six) hours as needed (Nausea or vomiting). 30 tablet 0  . MAGNESIUM GLYCINATE PLUS PO Take 100 mg by mouth.    . Melatonin 5 MG TABS Take 5 mg by mouth at bedtime as needed (for sleep.).    Marland Kitchen metFORMIN (GLUCOPHAGE) 1000 MG tablet Take 1,000 mg by mouth 2 (two) times daily.    . ondansetron (ZOFRAN) 8 MG tablet Take 1 tablet (8 mg total) by mouth 2 (two) times daily as needed. Start on the third day after chemotherapy. 30 tablet 1  . polyethylene glycol (MIRALAX / GLYCOLAX) packet Take 17 g by mouth daily.    .  quinapril (ACCUPRIL) 5 MG tablet Take 5 mg by mouth every morning.     . sodium bicarbonate 650 MG tablet Take 1,300 mg by mouth at bedtime.    Marland Kitchen aspirin-acetaminophen-caffeine (EXCEDRIN MIGRAINE) 250-250-65 MG tablet Take 2 tablets by mouth every 6 (six) hours as needed for headache.    . colesevelam (WELCHOL) 625 MG tablet Take 1,875 mg by mouth every evening.     Marland Kitchen dexamethasone (DECADRON) 4 MG tablet Take 2 tablets by mouth once a day on the day after chemotherapy and then take 2 tablets two times a day for 2 days. Take with food. (Patient not taking: Reported on 10/13/2017) 30 tablet 1  . nystatin (MYCOSTATIN) 100000 UNIT/ML suspension Take 5 mLs (500,000 Units total) by mouth 4 (four) times daily. (Patient not taking: Reported on 09/29/2017) 240 mL 0   No current facility-administered medications for this visit.     Review of Systems  Constitutional: Positive for malaise/fatigue. Negative for diaphoresis, fever and weight loss (weight up 1 pound).       I feel fine.  HENT: Negative for congestion, ear discharge, ear pain, nosebleeds, sinus pain and sore throat.        Runny nose  Eyes: Negative.  Negative for blurred vision, double vision, photophobia, discharge and redness.  Respiratory: Negative for cough, hemoptysis, sputum production and shortness of breath.   Cardiovascular: Negative.  Negative for chest pain, palpitations, orthopnea, leg swelling and PND.  Gastrointestinal: Positive for constipation (under control) and heartburn. Negative for abdominal pain, blood in stool, diarrhea, melena, nausea and vomiting.  Genitourinary: Negative for dysuria, frequency, hematuria and urgency.  Musculoskeletal: Negative for back pain, falls, joint pain and myalgias.  Skin: Negative for itching and rash.  Neurological: Negative for dizziness, tremors, weakness and headaches.  Endo/Heme/Allergies:       Diabetes - on Metformin  Psychiatric/Behavioral: Negative for depression and memory loss.  The patient is not nervous/anxious and does not have insomnia.   All other systems reviewed and are negative.  Performance status (ECOG): 1 - Symptomatic but completely ambulatory   Physical Exam: Blood pressure (!) 148/79, pulse 92, temperature 97.9 F (36.6 C), temperature source Tympanic, resp. rate 18, weight 142 lb 14.4 oz (64.8 kg). GENERAL:  Well developed, well nourished, woman sitting comfortably in the exam room in no acute distress. MENTAL STATUS:  Alert and oriented to person, place and time. HEAD:  Wearing a silver wig.  Normocephalic, atraumatic, face symmetric, no Cushingoid features. EYES:  Blue eyes.  Pupils equal round and reactive to light and accomodation.  No conjunctivitis or scleral icterus. ENT:  Oropharynx clear without lesion.  Tongue normal. Mucous membranes moist.  RESPIRATORY:  Clear to auscultation without rales, wheezes or rhonchi. CARDIOVASCULAR:  Regular rate and rhythm without murmur, rub or gallop. ABDOMEN:  Soft, non-tender, with active bowel sounds, and no hepatosplenomegaly.  No masses. SKIN:  No skin breakdown.  No rashes, ulcers or lesions. EXTREMITIES: No edema, no skin discoloration or tenderness.  No palpable cords. LYMPH NODES: No palpable cervical, supraclavicular, axillary or inguinal adenopathy  NEUROLOGICAL: Unremarkable. PSYCH:  Appropriate.    Infusion on 10/13/2017  Component Date Value Ref Range Status  . Sodium 10/13/2017 136  135 - 145 mmol/L Final  . Potassium 10/13/2017 3.4* 3.5 - 5.1 mmol/L Final  . Chloride 10/13/2017 103  98 - 111 mmol/L Final   Please note change in reference range.  . CO2 10/13/2017 23  22 - 32 mmol/L Final  . Glucose, Bld 10/13/2017 193* 70 - 99 mg/dL Final   Please note change in reference range.  . BUN 10/13/2017 10  8 - 23 mg/dL Final   Please note change in reference range.  . Creatinine, Ser 10/13/2017 0.73  0.44 - 1.00 mg/dL Final  . Calcium 10/13/2017 9.3  8.9 - 10.3 mg/dL Final  . Total Protein  10/13/2017 6.6  6.5 - 8.1 g/dL Final  . Albumin 10/13/2017 4.1  3.5 - 5.0 g/dL Final  . AST 10/13/2017 30  15 - 41 U/L Final  . ALT 10/13/2017 25  0 - 44 U/L Final   Please note change in reference range.  . Alkaline Phosphatase 10/13/2017 39  38 - 126 U/L Final  . Total Bilirubin 10/13/2017 0.9  0.3 - 1.2 mg/dL Final  . GFR calc non Af Amer 10/13/2017 >60  >60 mL/min Final  . GFR calc Af Amer 10/13/2017 >60  >60 mL/min Final   Comment: (NOTE) The eGFR has been calculated using the CKD EPI equation. This calculation has not been validated in all clinical situations. eGFR's persistently <60 mL/min signify possible Chronic Kidney Disease.   Georgiann Hahn gap 10/13/2017 10  5 - 15 Final   Performed at [facility], [address] [place], [place] [address]  . WBC 10/13/2017 2.9* 3.6 - 11.0 K/uL Final  . RBC 10/13/2017 3.02* 3.80 - 5.20 MIL/uL Final  . Hemoglobin 10/13/2017 10.2* 12.0 - 16.0 g/dL Final  . HCT 10/13/2017 29.2* 35.0 - 47.0 % Final  . MCV 10/13/2017 96.7  80.0 - 100.0 fL Final  . MCH 10/13/2017 33.8  26.0 - 34.0 pg Final  . MCHC 10/13/2017 34.9  32.0 - 36.0 g/dL Final  . RDW 10/13/2017 18.8* 11.5 - 14.5 % Final  . Platelets 10/13/2017 153  150 - 440 K/uL Final  . Neutrophils Relative % 10/13/2017 66  % Final  . Neutro Abs 10/13/2017 1.9  1.4 - 6.5 K/uL Final  . Lymphocytes Relative 10/13/2017 17  % Final  . Lymphs Abs 10/13/2017 0.5* 1.0 - 3.6 K/uL Final  . Monocytes Relative 10/13/2017 11  % Final  . Monocytes Absolute 10/13/2017 0.3  0.2 - 0.9 K/uL Final  . Eosinophils Relative 10/13/2017 5  % Final  . Eosinophils Absolute 10/13/2017 0.2  0 - 0.7 K/uL Final  . Basophils Relative 10/13/2017 1  % Final  . Basophils Absolute 10/13/2017 0.0  0 - 0.1 K/uL Final   Performed at [facility], [address], [place], [place] [address]  . Magnesium 10/13/2017 2.0  1.7 - 2.4 mg/dL Final   Performed at [facility], [address]  [address] [place], [place] [address]     Assessment:  Stephanie Sanders is a 72 y.o. 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  Pathology revealed two grade II invasive mammary carcinomas of no special type (4 cm from the nipple and 5 cm from the nipple).  Tumors were in close proximity.  Lymph node biopsy confirmed metastatic carcinoma of breast origin.  Tumor is ER negative (< 1%), PR negative and Her 2/neu 2+ by IHC.  [facility] genetic testing on 07/21/2017 revealed a variant of uncertain significance in RECQL4.  Right sided mammogram and ultrasound on 05/21/2017 revealed 2 highly suspicious masses in the right breast at the 10:30 position 4 cm from nipple (1.8 x 1.2 x 1.1 cm) and at the 10:30 position 5 cm from nipple (1.1 x 0.9 x 0.9 cm).  There was a suspicious 2.7 x 1 x 2.2 cm lymph node in the right axilla with asymmetric nodular cortical thickening.  Ultrasound guided biopsy of the 2 masses and lymph node on 05/28/2017 revealed grade II invasive mammary carcinoma of no special type (4 cm from the nipple and 5 cm from the nipple).  Lymph node biopsy revealed metastatic carcinoma of breast origin.  Tumor is ER negative (< 1%), PR negative and Her 2/neu 2+ by IHC.  FISH was negative.  CA 27.29 was 16.5 and CA15-3 was 17.6 on 06/04/2017.  Echo on 06/09/2017 revealed an EF of 60-65%.  She has enrolled on the UPBEAT clinical trial.  She received 4 cycles of AC (06/23/2017 - 09/08/2017) with Udencya support.  Cycle #1 was complicated by influenza A.  Nadir counts included an ANC of 0 and platelet count of 71,000. Cycle #2 postponed due to slow recovery from influenza and excisional cyst removal with surrounding cellulitis (treated with 10 days of doxycycline).   She is s/p week #2 Taxol (09/29/2017 - 10/06/2017).  Right mammogram and ultrasound on 09/28/2017 revealed the 2 adjacent masses in the right breast at the 10:30 o'clock position had both decreased in size (1.8 x 1.1  x 1.2 cm to 1.2 x 0.7 x 1.1 cm; 11 x 9 x 9 mm to 7 x 6 x 6 mm) as well as the metastatic right axillary lymph node (2 .7 cm to 1.6 x 0.9 x 1.7 cm).    She was admitted to [facility] from 06/28/2017 - 07/01/2017 with with fever, cough and generalized weakness.  Nasal swab was + for influenza A.  She completed a course of Tamiflu.  Symptomatically, she is doing well.  Exam is stable.  WBC is 2900 with an [place] of 1900.  Platelet count is 153,000.  Plan: 1. Labs today:  CBC with diff, CMP, Mg. 2. Labs reviewed. Blood counts stable and adequate enough for treatment. Will proceed with week #3 Taxol.  3. Discuss need for bone marrow stimulation using GCSF. Medication approved through financials. Patient to RTC tomorrow for GCSF injection.  4. Discuss nutrition.  Patient encouraged to increase intake for calorie and protein dense food choices. Will add supplement shakes 2-3 times a day.  5. Discuss symptom management.  Patient has antiemetics and pain medications at home to use on a PRN basis. Patient  advising that the prescribed interventions are adequate at this point. Continue all medications as previously prescribed.  6. RTC on 10/16/2017 for CBC with diff and +/- GCSF 7. RTC on 10/20/2017 for MD assessment, labs (CBC with diff), and week #4 Taxol.  Honor Loh, NP 10/13/2017,10:27 AM   I saw and evaluated the patient, participating in the key portions of the service and reviewing pertinent diagnostic studies and records.  I reviewed the nurse practitioner's note and agree with the findings and the plan.  The assessment and plan were discussed with the patient.  Multiple questions were asked by the patient and answered.   Nolon Stalls, MD 10/13/2017,10:27 AM

## 2017-10-13 NOTE — Progress Notes (Signed)
Patient here today for follow up. No concerns voiced. Overall "feeling pretty good."

## 2017-10-14 ENCOUNTER — Inpatient Hospital Stay: Payer: Medicare Other

## 2017-10-14 DIAGNOSIS — Z5111 Encounter for antineoplastic chemotherapy: Secondary | ICD-10-CM | POA: Diagnosis not present

## 2017-10-14 MED ORDER — TBO-FILGRASTIM 300 MCG/0.5ML ~~LOC~~ SOSY
480.0000 ug | PREFILLED_SYRINGE | Freq: Once | SUBCUTANEOUS | Status: AC
  Administered 2017-10-14: 480 ug via SUBCUTANEOUS

## 2017-10-16 ENCOUNTER — Other Ambulatory Visit: Payer: Self-pay | Admitting: Hematology and Oncology

## 2017-10-16 ENCOUNTER — Other Ambulatory Visit: Payer: Self-pay

## 2017-10-16 ENCOUNTER — Inpatient Hospital Stay: Payer: Medicare Other

## 2017-10-16 DIAGNOSIS — C50911 Malignant neoplasm of unspecified site of right female breast: Secondary | ICD-10-CM

## 2017-10-16 DIAGNOSIS — Z171 Estrogen receptor negative status [ER-]: Principal | ICD-10-CM

## 2017-10-16 DIAGNOSIS — Z5111 Encounter for antineoplastic chemotherapy: Secondary | ICD-10-CM | POA: Diagnosis not present

## 2017-10-16 LAB — COMPREHENSIVE METABOLIC PANEL
ALT: 25 U/L (ref 0–44)
AST: 22 U/L (ref 15–41)
Albumin: 4.2 g/dL (ref 3.5–5.0)
Alkaline Phosphatase: 49 U/L (ref 38–126)
Anion gap: 9 (ref 5–15)
BUN: 10 mg/dL (ref 8–23)
CO2: 24 mmol/L (ref 22–32)
Calcium: 9.3 mg/dL (ref 8.9–10.3)
Chloride: 103 mmol/L (ref 98–111)
Creatinine, Ser: 0.72 mg/dL (ref 0.44–1.00)
GFR calc Af Amer: >60 mL/min (ref >60)
GFR calc non Af Amer: >60 mL/min (ref >60)
Glucose, Bld: 164 mg/dL — ABNORMAL HIGH (ref 70–99)
Potassium: 3.8 mmol/L (ref 3.5–5.1)
Sodium: 136 mmol/L (ref 135–145)
Total Bilirubin: 0.9 mg/dL (ref 0.3–1.2)
Total Protein: 6.6 g/dL (ref 6.5–8.1)

## 2017-10-16 LAB — CBC WITH DIFFERENTIAL/PLATELET
Basophils Absolute: 0 10*3/uL (ref 0–0.1)
Basophils Relative: 0 %
Eosinophils Absolute: 0.1 10*3/uL (ref 0–0.7)
Eosinophils Relative: 1 %
HCT: 31.7 % — ABNORMAL LOW (ref 35.0–47.0)
Hemoglobin: 10.8 g/dL — ABNORMAL LOW (ref 12.0–16.0)
Lymphocytes Relative: 6 %
Lymphs Abs: 0.5 10*3/uL — ABNORMAL LOW (ref 1.0–3.6)
MCH: 33.4 pg (ref 26.0–34.0)
MCHC: 34.2 g/dL (ref 32.0–36.0)
MCV: 97.8 fL (ref 80.0–100.0)
Monocytes Absolute: 0.2 10*3/uL (ref 0.2–0.9)
Monocytes Relative: 3 %
Neutro Abs: 7.9 10*3/uL — ABNORMAL HIGH (ref 1.4–6.5)
Neutrophils Relative %: 90 %
Platelets: 162 10*3/uL (ref 150–440)
RBC: 3.24 MIL/uL — ABNORMAL LOW (ref 3.80–5.20)
RDW: 19.1 % — ABNORMAL HIGH (ref 11.5–14.5)
WBC: 8.7 10*3/uL (ref 3.6–11.0)

## 2017-10-16 LAB — MAGNESIUM: Magnesium: 1.9 mg/dL (ref 1.7–2.4)

## 2017-10-21 ENCOUNTER — Inpatient Hospital Stay: Payer: Medicare Other | Attending: Hematology and Oncology | Admitting: Hematology and Oncology

## 2017-10-21 ENCOUNTER — Inpatient Hospital Stay: Payer: Medicare Other

## 2017-10-21 ENCOUNTER — Other Ambulatory Visit: Payer: Medicare Other

## 2017-10-21 ENCOUNTER — Encounter: Payer: Self-pay | Admitting: Hematology and Oncology

## 2017-10-21 VITALS — BP 135/81 | HR 75 | Temp 97.9°F | Resp 18 | Wt 141.8 lb

## 2017-10-21 DIAGNOSIS — I10 Essential (primary) hypertension: Secondary | ICD-10-CM | POA: Diagnosis not present

## 2017-10-21 DIAGNOSIS — Z5189 Encounter for other specified aftercare: Secondary | ICD-10-CM | POA: Diagnosis not present

## 2017-10-21 DIAGNOSIS — Z87891 Personal history of nicotine dependence: Secondary | ICD-10-CM | POA: Insufficient documentation

## 2017-10-21 DIAGNOSIS — C773 Secondary and unspecified malignant neoplasm of axilla and upper limb lymph nodes: Secondary | ICD-10-CM

## 2017-10-21 DIAGNOSIS — Z171 Estrogen receptor negative status [ER-]: Secondary | ICD-10-CM

## 2017-10-21 DIAGNOSIS — Z5111 Encounter for antineoplastic chemotherapy: Secondary | ICD-10-CM | POA: Insufficient documentation

## 2017-10-21 DIAGNOSIS — T451X5A Adverse effect of antineoplastic and immunosuppressive drugs, initial encounter: Secondary | ICD-10-CM

## 2017-10-21 DIAGNOSIS — G620 Drug-induced polyneuropathy: Secondary | ICD-10-CM

## 2017-10-21 DIAGNOSIS — Z9221 Personal history of antineoplastic chemotherapy: Secondary | ICD-10-CM

## 2017-10-21 DIAGNOSIS — E119 Type 2 diabetes mellitus without complications: Secondary | ICD-10-CM | POA: Insufficient documentation

## 2017-10-21 DIAGNOSIS — G629 Polyneuropathy, unspecified: Secondary | ICD-10-CM

## 2017-10-21 DIAGNOSIS — C50911 Malignant neoplasm of unspecified site of right female breast: Secondary | ICD-10-CM

## 2017-10-21 DIAGNOSIS — C50411 Malignant neoplasm of upper-outer quadrant of right female breast: Secondary | ICD-10-CM

## 2017-10-21 LAB — CBC WITH DIFFERENTIAL/PLATELET
Basophils Absolute: 0 10*3/uL (ref 0–0.1)
Basophils Relative: 1 %
Eosinophils Absolute: 0.1 10*3/uL (ref 0–0.7)
Eosinophils Relative: 3 %
HCT: 30.4 % — ABNORMAL LOW (ref 35.0–47.0)
Hemoglobin: 10.2 g/dL — ABNORMAL LOW (ref 12.0–16.0)
Lymphocytes Relative: 16 %
Lymphs Abs: 0.4 10*3/uL — ABNORMAL LOW (ref 1.0–3.6)
MCH: 33.2 pg (ref 26.0–34.0)
MCHC: 33.7 g/dL (ref 32.0–36.0)
MCV: 98.5 fL (ref 80.0–100.0)
Monocytes Absolute: 0.5 10*3/uL (ref 0.2–0.9)
Monocytes Relative: 19 %
Neutro Abs: 1.6 10*3/uL (ref 1.4–6.5)
Neutrophils Relative %: 61 %
Platelets: 156 10*3/uL (ref 150–440)
RBC: 3.09 MIL/uL — ABNORMAL LOW (ref 3.80–5.20)
RDW: 19.3 % — ABNORMAL HIGH (ref 11.5–14.5)
WBC: 2.7 10*3/uL — ABNORMAL LOW (ref 3.6–11.0)

## 2017-10-21 LAB — COMPREHENSIVE METABOLIC PANEL
ALT: 27 U/L (ref 0–44)
AST: 28 U/L (ref 15–41)
Albumin: 3.9 g/dL (ref 3.5–5.0)
Alkaline Phosphatase: 46 U/L (ref 38–126)
Anion gap: 10 (ref 5–15)
BUN: 9 mg/dL (ref 8–23)
CO2: 24 mmol/L (ref 22–32)
Calcium: 9 mg/dL (ref 8.9–10.3)
Chloride: 105 mmol/L (ref 98–111)
Creatinine, Ser: 0.68 mg/dL (ref 0.44–1.00)
GFR calc Af Amer: >60 mL/min (ref >60)
GFR calc non Af Amer: >60 mL/min (ref >60)
Glucose, Bld: 129 mg/dL — ABNORMAL HIGH (ref 70–99)
Potassium: 3.8 mmol/L (ref 3.5–5.1)
Sodium: 139 mmol/L (ref 135–145)
Total Bilirubin: 0.7 mg/dL (ref 0.3–1.2)
Total Protein: 6.6 g/dL (ref 6.5–8.1)

## 2017-10-21 LAB — MAGNESIUM: Magnesium: 1.9 mg/dL (ref 1.7–2.4)

## 2017-10-21 MED ORDER — SODIUM CHLORIDE 0.9 % IV SOLN
Freq: Once | INTRAVENOUS | Status: AC
  Administered 2017-10-21: 10:00:00 via INTRAVENOUS
  Filled 2017-10-21: qty 1000

## 2017-10-21 MED ORDER — SODIUM CHLORIDE 0.9% FLUSH
10.0000 mL | INTRAVENOUS | Status: AC | PRN
  Administered 2017-10-21: 10 mL via INTRAVENOUS
  Filled 2017-10-21: qty 10

## 2017-10-21 MED ORDER — FAMOTIDINE IN NACL 20-0.9 MG/50ML-% IV SOLN
20.0000 mg | Freq: Once | INTRAVENOUS | Status: AC
  Administered 2017-10-21: 20 mg via INTRAVENOUS
  Filled 2017-10-21: qty 50

## 2017-10-21 MED ORDER — PACLITAXEL CHEMO INJECTION 300 MG/50ML
80.0000 mg/m2 | Freq: Once | INTRAVENOUS | Status: AC
  Administered 2017-10-21: 150 mg via INTRAVENOUS
  Filled 2017-10-21: qty 25

## 2017-10-21 MED ORDER — DIPHENHYDRAMINE HCL 50 MG/ML IJ SOLN
50.0000 mg | Freq: Once | INTRAMUSCULAR | Status: AC
  Administered 2017-10-21: 50 mg via INTRAVENOUS
  Filled 2017-10-21: qty 1

## 2017-10-21 MED ORDER — PALONOSETRON HCL INJECTION 0.25 MG/5ML
0.2500 mg | Freq: Once | INTRAVENOUS | Status: AC
  Administered 2017-10-21: 0.25 mg via INTRAVENOUS
  Filled 2017-10-21: qty 5

## 2017-10-21 MED ORDER — SODIUM CHLORIDE 0.9 % IV SOLN
Freq: Once | INTRAVENOUS | Status: AC
  Administered 2017-10-21: 10:00:00 via INTRAVENOUS
  Filled 2017-10-21: qty 5

## 2017-10-21 MED ORDER — HEPARIN SOD (PORK) LOCK FLUSH 100 UNIT/ML IV SOLN
500.0000 [IU] | Freq: Once | INTRAVENOUS | Status: AC
  Administered 2017-10-21: 500 [IU] via INTRAVENOUS

## 2017-10-21 NOTE — Progress Notes (Signed)
[facility] day:  10/21/2017   Chief Complaint: Stephanie Sanders is a 72 y.o. female with multi-focal right breast cancer who is seen for assessment prior to week #4 Taxol.  HPI:  The patient was last seen in the medical oncology clinic on 10/13/2017.  At that time, she was doing well.  Exam was stable.  WBC was 2900 with an [place] of 1900.  Platelet count was 153,000.  She received week #3 Taxol followed by GCSF on 10/14/2017.  During the interim, she has been doing "pretty good".  She notes no real problems.  She notes a little numbness and tingling on her left side (tips of hr fingers and top of foot).  Symptoms have been very mild since Friday (10/17/2017).  Symptoms do not affect her ability to walk or do things.  It does not keep her up at night.    She occasionally has pain in the mid sternal area which can radiate to the right lower ribs.  When she has pain, it is located "on top of the rib" and feels like "nerve pain".  She denies any radiation to neck, jaw, upper extremity, shortness of breath or heaviness in her chest.  She states "it feels like sharp gas pain".   Past Medical History:  Diagnosis Date  . Cancer ([place])   . Diabetes mellitus without complication ([place])   . Diabetic nephropathy ([place])   . Headache    migraines  . Heart murmur    ASYMPTOMATIC  . Hypertension   . Hypothyroidism   . Personal history of chemotherapy 2019   right breast cancer  . Seborrheic keratosis   . Vitamin D deficiency     Past Surgical History:  Procedure Laterality Date  . APPENDECTOMY    . BREAST BIOPSY Right 05/28/2017   right breast bx 3 areas invasive mamm ca lymph node mets  . BREAST CYST ASPIRATION Bilateral    neg  . CHOLECYSTECTOMY    . COLONOSCOPY WITH PROPOFOL N/A 01/01/2015   Procedure: COLONOSCOPY WITH PROPOFOL;  Surgeon: Josefine Class, MD;  [facility];  Service: Endoscopy;  Laterality: N/A;  . EYE SURGERY     eyelid  . PORTACATH PLACEMENT Right 06/10/2017   Procedure: INSERTION PORT-A-CATH;  Surgeon: Herbert Pun, MD;  Location: [facility] ORS;  Service: General;  Laterality: Right;  . STENT PLACEMENT ILIAC ([place] HX)    . TUBAL LIGATION      Family History  Problem Relation Age of Onset  . Cancer Mother   . Cancer Maternal Aunt   . Breast cancer Neg Hx     Social History:  reports that she quit smoking about 45 years ago. Her smoking use included cigarettes. She has a 20.00 pack-year smoking history. She has never used smokeless tobacco. She reports that she does not drink alcohol or use drugs.  She has 3 children (daughters: age 96 and 15; son: age 40).  She is retired.  She worked for the [employer].  Her husband has dementia.  She lives in [place].  She is alone today.  Allergies:  Allergies  Allergen Reactions  . Fiorinal [Butalbital-Aspirin-Caffeine] Nausea And Vomiting and Other (See Comments)    SEVERE HEADACHE  . Sulfa Antibiotics Other (See Comments)    Burning esophagus and stomach  . Tramadol Nausea And Vomiting  . Other Rash    STERI-STRIPS-RASH STERI-STRIPS-RASH    Current Medications: Current Outpatient Medications  Medication Sig Dispense Refill  .  acetaminophen (TYLENOL) 500 MG tablet Take 1,000 mg by mouth every 6 (six) hours as needed (for headaches.).    Marland Kitchen atorvastatin (LIPITOR) 10 MG tablet Take 10 mg by mouth every evening.     . Cholecalciferol 2000 UNITS CAPS Take 4,000 Units by mouth daily.     . colesevelam (WELCHOL) 625 MG tablet Take 1,875 mg by mouth every evening.     Marland Kitchen levothyroxine (SYNTHROID, LEVOTHROID) 50 MCG tablet Take 50 mcg by mouth daily before breakfast.    . lidocaine-prilocaine (EMLA) cream Apply 1 application topically as needed. 30 g 6  . loratadine (CLARITIN) 10 MG tablet Take 10 mg by mouth daily.    Marland Kitchen LORazepam (ATIVAN) 0.5 MG tablet Take 1 tablet (0.5 mg total) by mouth every 6 (six) hours as needed (Nausea or vomiting). 30 tablet 0  .  MAGNESIUM GLYCINATE PLUS PO Take 100 mg by mouth.    . Melatonin 5 MG TABS Take 5 mg by mouth at bedtime as needed (for sleep.).    Marland Kitchen metFORMIN (GLUCOPHAGE) 1000 MG tablet Take 1,000 mg by mouth 2 (two) times daily.    . ondansetron (ZOFRAN) 8 MG tablet Take 1 tablet (8 mg total) by mouth 2 (two) times daily as needed. Start on the third day after chemotherapy. 30 tablet 1  . polyethylene glycol (MIRALAX / GLYCOLAX) packet Take 17 g by mouth daily.    . quinapril (ACCUPRIL) 5 MG tablet Take 5 mg by mouth every morning.     . sodium bicarbonate 650 MG tablet Take 1,300 mg by mouth at bedtime.    Marland Kitchen aspirin-acetaminophen-caffeine (EXCEDRIN MIGRAINE) 250-250-65 MG tablet Take 2 tablets by mouth every 6 (six) hours as needed for headache.    . dexamethasone (DECADRON) 4 MG tablet Take 2 tablets by mouth once a day on the day after chemotherapy and then take 2 tablets two times a day for 2 days. Take with food. (Patient not taking: Reported on 10/13/2017) 30 tablet 1  . nystatin (MYCOSTATIN) 100000 UNIT/ML suspension Take 5 mLs (500,000 Units total) by mouth 4 (four) times daily. (Patient not taking: Reported on 09/29/2017) 240 mL 0   No current facility-administered medications for this visit.    Facility-Administered Medications Ordered in Other Visits  Medication Dose Route Frequency Provider Last Rate Last Dose  . heparin lock flush 100 unit/mL  500 Units Intravenous Once Corcoran, Melissa C, MD      . sodium chloride flush (NS) 0.9 % injection 10 mL  10 mL Intravenous PRN Lequita Asal, MD   10 mL at 10/21/17 0841    Review of Systems  Constitutional: Positive for malaise/fatigue. Negative for chills, fever and weight loss (weight up 1 pound).       I feel "pretty good".  Not real active.  Sometimes feels tired.  HENT: Negative.  Negative for congestion, ear discharge, ear pain, hearing loss, nosebleeds, sinus pain and sore throat.   Eyes: Negative.  Negative for blurred vision, double  vision, photophobia, discharge and redness.  Respiratory: Negative.  Negative for cough, hemoptysis, sputum production, shortness of breath and stridor.   Cardiovascular: Negative.  Negative for chest pain, palpitations, orthopnea, leg swelling and PND.  Gastrointestinal: Positive for heartburn. Negative for abdominal pain, blood in stool, constipation, diarrhea and nausea.  Genitourinary: Negative.  Negative for dysuria, flank pain, frequency, hematuria and urgency.  Musculoskeletal: Negative for back pain, joint pain, myalgias and neck pain.       Sharp gas like pains  felt in right lower ribs  Skin: Negative.  Negative for itching and rash.  Neurological: Positive for tingling and sensory change. Negative for dizziness, tremors, speech change, focal weakness, weakness and headaches.       Little bit of numbness/tingling in fingertips and top of foot.  Endo/Heme/Allergies: Negative for environmental allergies. Does not bruise/bleed easily.       Diabetes - on Metformin  Psychiatric/Behavioral: Negative for depression and memory loss. The patient is not nervous/anxious and does not have insomnia.   All other systems reviewed and are negative.  Performance status (ECOG): 1 - Symptomatic but completely ambulatory   Physical Exam: Blood pressure 135/81, pulse 75, temperature 97.9 F (36.6 C), temperature source Tympanic, resp. rate 18, weight 141 lb 12.1 oz (64.3 kg). GENERAL:  Well developed, well nourished, woman sitting comfortably in the exam room in no acute distress. MENTAL STATUS:  Alert and oriented to person, place and time. HEAD:  Normocephalic, atraumatic, face symmetric, no Cushingoid features. EYES:  Blue eyes.  Pupils equal round and reactive to light and accomodation.  No conjunctivitis or scleral icterus. ENT:  Oropharynx clear without lesion.  Tongue normal. Mucous membranes moist.  RESPIRATORY:  Clear to auscultation without rales, wheezes or rhonchi. CARDIOVASCULAR:  Regular  rate and rhythm without murmur, rub or gallop. ABDOMEN:  Soft, non-tender, with active bowel sounds, and no hepatosplenomegaly.  No masses. SKIN:  No rashes, ulcers or lesions. EXTREMITIES: No edema, no skin discoloration or tenderness.  No palpable cords. LYMPH NODES: No palpable cervical, supraclavicular, axillary or inguinal adenopathy  NEUROLOGICAL: Unremarkable. PSYCH:  Appropriate.    Infusion on 10/21/2017  Component Date Value Ref Range Status  . WBC 10/21/2017 2.7* 3.6 - 11.0 K/uL Final  . RBC 10/21/2017 3.09* 3.80 - 5.20 MIL/uL Final  . Hemoglobin 10/21/2017 10.2* 12.0 - 16.0 g/dL Final  . HCT 10/21/2017 30.4* 35.0 - 47.0 % Final  . MCV 10/21/2017 98.5  80.0 - 100.0 fL Final  . MCH 10/21/2017 33.2  26.0 - 34.0 pg Final  . MCHC 10/21/2017 33.7  32.0 - 36.0 g/dL Final  . RDW 10/21/2017 19.3* 11.5 - 14.5 % Final  . Platelets 10/21/2017 156  150 - 440 K/uL Final  . Neutrophils Relative % 10/21/2017 61  % Final  . Neutro Abs 10/21/2017 1.6  1.4 - 6.5 K/uL Final  . Lymphocytes Relative 10/21/2017 16  % Final  . Lymphs Abs 10/21/2017 0.4* 1.0 - 3.6 K/uL Final  . Monocytes Relative 10/21/2017 19  % Final  . Monocytes Absolute 10/21/2017 0.5  0.2 - 0.9 K/uL Final  . Eosinophils Relative 10/21/2017 3  % Final  . Eosinophils Absolute 10/21/2017 0.1  0 - 0.7 K/uL Final  . Basophils Relative 10/21/2017 1  % Final  . Basophils Absolute 10/21/2017 0.0  0 - 0.1 K/uL Final   Performed at [facility], [address] [place], [place] [address]    Assessment:  Stephanie Sanders is a 72 y.o. 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  Pathology revealed two grade II invasive mammary carcinomas of no special type (4 cm from the nipple and 5 cm from the nipple).  Tumors were in close proximity.  Lymph node biopsy confirmed metastatic carcinoma of breast origin.  Tumor is ER negative (< 1%), PR negative and Her 2/neu 2+  by IHC.  [employer] genetic testing on 07/21/2017 revealed a variant of uncertain significance in RECQL4.  Right  sided mammogram and ultrasound on 05/21/2017 revealed 2 highly suspicious masses in the right breast at the 10:30 position 4 cm from nipple (1.8 x 1.2 x 1.1 cm) and at the 10:30 position 5 cm from nipple (1.1 x 0.9 x 0.9 cm).  There was a suspicious 2.7 x 1 x 2.2 cm lymph node in the right axilla with asymmetric nodular cortical thickening.  Ultrasound guided biopsy of the 2 masses and lymph node on 05/28/2017 revealed grade II invasive mammary carcinoma of no special type (4 cm from the nipple and 5 cm from the nipple).  Lymph node biopsy revealed metastatic carcinoma of breast origin.  Tumor is ER negative (< 1%), PR negative and Her 2/neu 2+ by IHC.  FISH was negative.  CA 27.29 was 16.5 and CA15-3 was 17.6 on 06/04/2017.  Echo on 06/09/2017 revealed an EF of 60-65%.  She has enrolled on the UPBEAT clinical trial.  She received 4 cycles of AC (06/23/2017 - 09/08/2017) with Udencya support.  Cycle #1 was complicated by influenza A.  Nadir counts included an ANC of 0 and platelet count of 71,000. Cycle #2 postponed due to slow recovery from influenza and excisional cyst removal with surrounding cellulitis (treated with 10 days of doxycycline).   She is s/p week #3 Taxol (09/29/2017 - 10/13/2017).  She received GCSF after week #3 Taxol secondary to borderline counts.  Right mammogram and ultrasound on 09/28/2017 revealed the 2 adjacent masses in the right breast at the 10:30 o'clock position had both decreased in size (1.8 x 1.1 x 1.2 cm to 1.2 x 0.7 x 1.1 cm; 11 x 9 x 9 mm to 7 x 6 x 6 mm) as well as the metastatic right axillary lymph node (2 .7 cm to 1.6 x 0.9 x 1.7 cm).    She was [facility] to [facility] from 06/28/2017 - 07/01/2017 with with fever, cough and generalized weakness.  Nasal swab was + for influenza A.  She completed a course of Tamiflu.  Symptomatically, she has a grade I  neuropathy. She has fleeting pains in the right lower ribs/upprer abdomen like "sharp gas pains".  Exam is benign.  WBC is 2700 with an [place] of 1160.  Platelet count is 156,000.  Plan: 1. Labs today:  CBC with diff, CMP, Mg. 2. Labs reviewed. Blood counts stable and adequate enough for treatment. Will proceed with week #4 Taxol.  3.   Discuss need for GCSF to maintain counts and continue with chemotherapy.  Discuss taking Claritin to prevent bone pain. 4.   Discuss mild neuropathy.  No intervention needed.  Continue to monitor. 5.   RTC in [place] on Friday (10/23/2017) for GCSF 6.   RTC on Monday 10/26/2017 in [place] for labs (CBC with diff) +/- GCSF 7.   RTC on Thursday 10/29/2017 in [place] for NP assessment, labs (CBC with diff, CMP, Mg) and week #5 Taxol.   Lequita Asal, MD 10/21/2017,9:02 AM

## 2017-10-21 NOTE — Patient Instructions (Signed)
Paclitaxel injection What is this medicine? PACLITAXEL (PAK li TAX el) is a chemotherapy drug. It targets fast dividing cells, like cancer cells, and causes these cells to die. This medicine is used to treat ovarian cancer, breast cancer, and other cancers. This medicine may be used for other purposes; ask your health care provider or pharmacist if you have questions. COMMON BRAND NAME(S): Onxol, Taxol What should I tell my health care provider before I take this medicine? They need to know if you have any of these conditions: -blood disorders -irregular heartbeat -infection (especially a virus infection such as chickenpox, cold sores, or herpes) -liver disease -previous or ongoing radiation therapy -an unusual or allergic reaction to paclitaxel, alcohol, polyoxyethylated castor oil, other chemotherapy agents, other medicines, foods, dyes, or preservatives -pregnant or trying to get pregnant -breast-feeding How should I use this medicine? This drug is given as an infusion into a vein. It is administered in a hospital or clinic by a specially trained health care professional. Talk to your pediatrician regarding the use of this medicine in children. Special care may be needed. Overdosage: If you think you have taken too much of this medicine contact a poison control center or emergency room at once. NOTE: This medicine is only for you. Do not share this medicine with others. What if I miss a dose? It is important not to miss your dose. Call your doctor or health care professional if you are unable to keep an appointment. What may interact with this medicine? Do not take this medicine with any of the following medications: -disulfiram -metronidazole This medicine may also interact with the following medications: -cyclosporine -diazepam -ketoconazole -medicines to increase blood counts like filgrastim, pegfilgrastim, sargramostim -other chemotherapy drugs like cisplatin, doxorubicin,  epirubicin, etoposide, teniposide, vincristine -quinidine -testosterone -vaccines -verapamil Talk to your doctor or health care professional before taking any of these medicines: -acetaminophen -aspirin -ibuprofen -ketoprofen -naproxen This list may not describe all possible interactions. Give your health care provider a list of all the medicines, herbs, non-prescription drugs, or dietary supplements you use. Also tell them if you smoke, drink alcohol, or use illegal drugs. Some items may interact with your medicine. What should I watch for while using this medicine? Your condition will be monitored carefully while you are receiving this medicine. You will need important blood work done while you are taking this medicine. This medicine can cause serious allergic reactions. To reduce your risk you will need to take other medicine(s) before treatment with this medicine. If you experience allergic reactions like skin rash, itching or hives, swelling of the face, lips, or tongue, tell your doctor or health care professional right away. In some cases, you may be given additional medicines to help with side effects. Follow all directions for their use. This drug may make you feel generally unwell. This is not uncommon, as chemotherapy can affect healthy cells as well as cancer cells. Report any side effects. Continue your course of treatment even though you feel ill unless your doctor tells you to stop. Call your doctor or health care professional for advice if you get a fever, chills or sore throat, or other symptoms of a cold or flu. Do not treat yourself. This drug decreases your body's ability to fight infections. Try to avoid being around people who are sick. This medicine may increase your risk to bruise or bleed. Call your doctor or health care professional if you notice any unusual bleeding. Be careful brushing and flossing your teeth or   using a toothpick because you may get an infection or  bleed more easily. If you have any dental work done, tell your dentist you are receiving this medicine. Avoid taking products that contain aspirin, acetaminophen, ibuprofen, naproxen, or ketoprofen unless instructed by your doctor. These medicines may hide a fever. Do not become pregnant while taking this medicine. Women should inform their doctor if they wish to become pregnant or think they might be pregnant. There is a potential for serious side effects to an unborn child. Talk to your health care professional or pharmacist for more information. Do not breast-feed an infant while taking this medicine. Men are advised not to father a child while receiving this medicine. This product may contain alcohol. Ask your pharmacist or healthcare provider if this medicine contains alcohol. Be sure to tell all healthcare providers you are taking this medicine. Certain medicines, like metronidazole and disulfiram, can cause an unpleasant reaction when taken with alcohol. The reaction includes flushing, headache, nausea, vomiting, sweating, and increased thirst. The reaction can last from 30 minutes to several hours. What side effects may I notice from receiving this medicine? Side effects that you should report to your doctor or health care professional as soon as possible: -allergic reactions like skin rash, itching or hives, swelling of the face, lips, or tongue -low blood counts - This drug may decrease the number of white blood cells, red blood cells and platelets. You may be at increased risk for infections and bleeding. -signs of infection - fever or chills, cough, sore throat, pain or difficulty passing urine -signs of decreased platelets or bleeding - bruising, pinpoint red spots on the skin, black, tarry stools, nosebleeds -signs of decreased red blood cells - unusually weak or tired, fainting spells, lightheadedness -breathing problems -chest pain -high or low blood pressure -mouth sores -nausea and  vomiting -pain, swelling, redness or irritation at the injection site -pain, tingling, numbness in the hands or feet -slow or irregular heartbeat -swelling of the ankle, feet, hands Side effects that usually do not require medical attention (report to your doctor or health care professional if they continue or are bothersome): -bone pain -complete hair loss including hair on your head, underarms, pubic hair, eyebrows, and eyelashes -changes in the color of fingernails -diarrhea -loosening of the fingernails -loss of appetite -muscle or joint pain -red flush to skin -sweating This list may not describe all possible side effects. Call your doctor for medical advice about side effects. You may report side effects to FDA at 1-800-FDA-1088. Where should I keep my medicine? This drug is given in a hospital or clinic and will not be stored at home. NOTE: This sheet is a summary. It may not cover all possible information. If you have questions about this medicine, talk to your doctor, pharmacist, or health care provider.  2018 Elsevier/Gold Standard (2015-02-06 19:58:00)  

## 2017-10-21 NOTE — Progress Notes (Signed)
Patient states in the morning she has a loose stool with stomach cramps but after that she is fine.  She also c/o shooting pain in the sternum area and also muscle spasm under her right ribs on occasion.  Further states she is beginning to get neuropathy in her fingers and toes just on her left side.

## 2017-10-23 ENCOUNTER — Inpatient Hospital Stay: Payer: Medicare Other

## 2017-10-23 DIAGNOSIS — Z5111 Encounter for antineoplastic chemotherapy: Secondary | ICD-10-CM | POA: Diagnosis not present

## 2017-10-23 MED ORDER — TBO-FILGRASTIM 300 MCG/0.5ML ~~LOC~~ SOSY
480.0000 ug | PREFILLED_SYRINGE | Freq: Once | SUBCUTANEOUS | Status: AC
  Administered 2017-10-23: 480 ug via SUBCUTANEOUS

## 2017-10-25 DIAGNOSIS — G620 Drug-induced polyneuropathy: Secondary | ICD-10-CM | POA: Insufficient documentation

## 2017-10-25 DIAGNOSIS — T451X5A Adverse effect of antineoplastic and immunosuppressive drugs, initial encounter: Secondary | ICD-10-CM

## 2017-10-26 ENCOUNTER — Other Ambulatory Visit: Payer: Self-pay | Admitting: Hematology and Oncology

## 2017-10-26 ENCOUNTER — Inpatient Hospital Stay: Payer: Medicare Other

## 2017-10-26 DIAGNOSIS — Z5111 Encounter for antineoplastic chemotherapy: Secondary | ICD-10-CM | POA: Diagnosis not present

## 2017-10-26 DIAGNOSIS — Z171 Estrogen receptor negative status [ER-]: Principal | ICD-10-CM

## 2017-10-26 DIAGNOSIS — C50211 Malignant neoplasm of upper-inner quadrant of right female breast: Secondary | ICD-10-CM

## 2017-10-26 DIAGNOSIS — C50911 Malignant neoplasm of unspecified site of right female breast: Secondary | ICD-10-CM

## 2017-10-26 LAB — COMPREHENSIVE METABOLIC PANEL
ALT: 22 U/L (ref 0–44)
AST: 21 U/L (ref 15–41)
Albumin: 4.2 g/dL (ref 3.5–5.0)
Alkaline Phosphatase: 56 U/L (ref 38–126)
Anion gap: 9 (ref 5–15)
BUN: 12 mg/dL (ref 8–23)
CO2: 23 mmol/L (ref 22–32)
Calcium: 9.4 mg/dL (ref 8.9–10.3)
Chloride: 100 mmol/L (ref 98–111)
Creatinine, Ser: 0.74 mg/dL (ref 0.44–1.00)
GFR calc Af Amer: >60 mL/min (ref >60)
GFR calc non Af Amer: >60 mL/min (ref >60)
Glucose, Bld: 202 mg/dL — ABNORMAL HIGH (ref 70–99)
Potassium: 4.1 mmol/L (ref 3.5–5.1)
Sodium: 132 mmol/L — ABNORMAL LOW (ref 135–145)
Total Bilirubin: 0.7 mg/dL (ref 0.3–1.2)
Total Protein: 6.6 g/dL (ref 6.5–8.1)

## 2017-10-26 LAB — CBC WITH DIFFERENTIAL/PLATELET
Basophils Absolute: 0 10*3/uL (ref 0–0.1)
Basophils Relative: 1 %
Eosinophils Absolute: 0.1 10*3/uL (ref 0–0.7)
Eosinophils Relative: 2 %
HCT: 29.7 % — ABNORMAL LOW (ref 35.0–47.0)
Hemoglobin: 10.3 g/dL — ABNORMAL LOW (ref 12.0–16.0)
Lymphocytes Relative: 11 %
Lymphs Abs: 0.4 10*3/uL — ABNORMAL LOW (ref 1.0–3.6)
MCH: 33.8 pg (ref 26.0–34.0)
MCHC: 34.6 g/dL (ref 32.0–36.0)
MCV: 97.7 fL (ref 80.0–100.0)
Monocytes Absolute: 0.2 10*3/uL (ref 0.2–0.9)
Monocytes Relative: 5 %
Neutro Abs: 3.2 10*3/uL (ref 1.4–6.5)
Neutrophils Relative %: 81 %
Platelets: 142 10*3/uL — ABNORMAL LOW (ref 150–440)
RBC: 3.04 MIL/uL — ABNORMAL LOW (ref 3.80–5.20)
RDW: 18.1 % — ABNORMAL HIGH (ref 11.5–14.5)
WBC: 3.9 10*3/uL (ref 3.6–11.0)

## 2017-10-27 ENCOUNTER — Other Ambulatory Visit: Payer: Self-pay | Admitting: Hematology and Oncology

## 2017-10-28 NOTE — Progress Notes (Signed)
On 7/15/2019[facility]:  10/28/2017   Chief Complaint: Stephanie Sanders is a 72 y.o. female with multi-focal right breast cancer who is seen for assessment prior to week #5 Taxol.  HPI:  The patient was last seen in the medical oncology clinic on 10/21/2017.  At that time, patient was doing "pretty good".  She complained of numbness and tingling in her hands and top of her left foot.  Symptoms noted be very mild since 10/17/2017, and reported to not affect her ability to walk or complete day-to-day task.  Additionally, patient complains of retrosternal chest pain that intermittently radiated into the right rib area.  Patient stated, "it is on the top of the rib and it feels like a nerve pain". Exam was benign.  WBC 2700 (ANC 1600).  Platelets 156,000.  GCSF treatments were discussed in order to support white count.  Patient received week #4 Taxol.  Patient returned to the clinic on 10/23/2017 for GCSF 480 mcg injection.  Follow-up counts on 10/26/2017 revealed a WBC of 3900 ([place] 3200).  Hemoglobin 10.3, hematocrit 29.7, MCV 97.7, and platelets 142,000.  Sodium low at 132.  Glucose elevated at 202.  Renal function and LFTs normal.  In the interim, patient notes that the first 4 to 5 days following chemo or "rough".  She experiences marked fatigued.  She notes that she feels more "gassy", and that her oral intake is not adequate.  Patient states, "after I start feeling better it is time to start all over again".  She notes that she feels well today, and has no acute complaints.  Patient has varying bowel habits.  She notes that she will go from being constipated to having loose stools.  She is using MiraLAX as needed.  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  Symptom is intermittent and not reported to affect function in any way.  Patient makes mention of the fact that she experienced "aching pain" in her back  following her GCSF injection.  Symptom improved with the use of recommended loratadine.  Patient denies that she has experienced any B symptoms. She denies any interval infections. Patient advises that she maintains an adequate appetite. She is eating well. Weight today is 142 lb 9 oz (64.7 kg), which compared to her last visit to the clinic, represents a 1 pound increase.   Patient denies pain in the clinic today.  Past Medical History:  Diagnosis Date  . Cancer ([place])   . Diabetes mellitus without complication ([place])   . Diabetic nephropathy ([place])   . Headache    migraines  . Heart murmur    ASYMPTOMATIC  . Hypertension   . Hypothyroidism   . Personal history of chemotherapy 2019   right breast cancer  . Seborrheic keratosis   . Vitamin D deficiency     Past Surgical History:  Procedure Laterality Date  . APPENDECTOMY    . BREAST BIOPSY Right 05/28/2017   right breast bx 3 areas invasive mamm ca lymph node mets  . BREAST CYST ASPIRATION Bilateral    neg  . CHOLECYSTECTOMY    . COLONOSCOPY WITH PROPOFOL N/A 01/01/2015   Procedure: COLONOSCOPY WITH PROPOFOL;  Surgeon: Josefine Class, MD;  [facility] ENDOSCOPY;  Service: Endoscopy;  Laterality: N/A;  . EYE SURGERY     eyelid  . PORTACATH PLACEMENT Right 06/10/2017   Procedure: INSERTION PORT-A-CATH;  Surgeon: Herbert Pun, MD;  Location:  [facility] ORS;  Service: General;  Laterality: Right;  . STENT PLACEMENT ILIAC ([place] HX)    . TUBAL LIGATION      Family History  Problem Relation Age of Onset  . Cancer Mother   . Cancer Maternal Aunt   . Breast cancer Neg Hx     Social History:  reports that she quit smoking about 45 years ago. Her smoking use included cigarettes. She has a 20.00 pack-year smoking history. She has never used smokeless tobacco. She reports that she does not drink alcohol or use drugs.  She has 3 children (daughters: age 3 and 72; son: age 24).  She is retired.  She worked for the [employer].  Her  husband has dementia.  She lives in [place].  She is accompanied by her son, Stephanie Sanders, today.  Allergies:  Allergies  Allergen Reactions  . Fiorinal [Butalbital-Aspirin-Caffeine] Nausea And Vomiting and Other (See Comments)    SEVERE HEADACHE  . Sulfa Antibiotics Other (See Comments)    Burning esophagus and stomach  . Tramadol Nausea And Vomiting  . Other Rash    STERI-STRIPS-RASH STERI-STRIPS-RASH    Current Medications: Current Outpatient Medications  Medication Sig Dispense Refill  . acetaminophen (TYLENOL) 500 MG tablet Take 1,000 mg by mouth every 6 (six) hours as needed (for headaches.).    Marland Kitchen atorvastatin (LIPITOR) 10 MG tablet Take 10 mg by mouth every evening.     . Cholecalciferol 2000 UNITS CAPS Take 4,000 Units by mouth daily.     . colesevelam (WELCHOL) 625 MG tablet Take 1,875 mg by mouth every evening.     Marland Kitchen dexamethasone (DECADRON) 4 MG tablet Take 2 tablets by mouth once a day on the day after chemotherapy and then take 2 tablets two times a day for 2 days. Take with food. 30 tablet 1  . levothyroxine (SYNTHROID, LEVOTHROID) 50 MCG tablet Take 50 mcg by mouth daily before breakfast.    . lidocaine-prilocaine (EMLA) cream Apply 1 application topically as needed. 30 g 6  . loratadine (CLARITIN) 10 MG tablet Take 10 mg by mouth daily.    Marland Kitchen LORazepam (ATIVAN) 0.5 MG tablet Take 1 tablet (0.5 mg total) by mouth every 6 (six) hours as needed (Nausea or vomiting). 30 tablet 0  . MAGNESIUM GLYCINATE PLUS PO Take 100 mg by mouth.    . Melatonin 5 MG TABS Take 5 mg by mouth at bedtime as needed (for sleep.).    Marland Kitchen metFORMIN (GLUCOPHAGE) 1000 MG tablet Take 1,000 mg by mouth 2 (two) times daily.    . ondansetron (ZOFRAN) 8 MG tablet Take 1 tablet (8 mg total) by mouth 2 (two) times daily as needed. Start on the third day after chemotherapy. 30 tablet 1  . polyethylene glycol (MIRALAX / GLYCOLAX) packet Take 17 g by mouth daily.    . quinapril (ACCUPRIL) 5 MG tablet Take 5 mg by  mouth every morning.     . sodium bicarbonate 650 MG tablet Take 1,300 mg by mouth at bedtime.    Marland Kitchen aspirin-acetaminophen-caffeine (EXCEDRIN MIGRAINE) 250-250-65 MG tablet Take 2 tablets by mouth every 6 (six) hours as needed for headache.    . nystatin (MYCOSTATIN) 100000 UNIT/ML suspension Take 5 mLs (500,000 Units total) by mouth 4 (four) times daily. (Patient not taking: Reported on 10/29/2017) 240 mL 0   No current facility-administered medications for this visit.    Facility-Administered Medications Ordered in Other Visits  Medication Dose Route Frequency Provider Last Rate Last Dose  . sodium  chloride flush (NS) 0.9 % injection 10 mL  10 mL Intravenous PRN Lequita Asal, MD   10 mL at 10/21/17 0841    Review of Systems  Constitutional: Positive for malaise/fatigue. Negative for diaphoresis, fever and weight loss (weight up 1 pound).       "I feel good today.  The first week after chemo is rough though".  HENT: Negative.   Eyes: Negative.   Respiratory: Negative for cough, hemoptysis, sputum production and shortness of breath.   Cardiovascular: Positive for leg swelling. Negative for chest pain, palpitations, orthopnea and PND.  Gastrointestinal: Positive for constipation (varying bowel habits - uses Miralax PRN), diarrhea (varying bowel habits) and heartburn. Negative for abdominal pain, blood in stool, melena and vomiting.  Genitourinary: Negative for dysuria, frequency, hematuria and urgency.  Musculoskeletal: Positive for back pain (following GCSF injection). Negative for falls, joint pain and myalgias.  Skin: Negative for itching and rash.  Neurological: Positive for tingling (minor stable neuropathy in LEFT hand and foot only - intermittent - does not affect function) and sensory change. Negative for dizziness, tremors, weakness and headaches.  Endo/Heme/Allergies: Does not bruise/bleed easily.       Diabetes - on Metformin  Psychiatric/Behavioral: Negative for depression,  memory loss and suicidal ideas. The patient is not nervous/anxious and does not have insomnia.   All other systems reviewed and are negative.  Performance status (ECOG): 1 - Symptomatic but completely ambulatory  Vital signs BP (!) 144/86 (BP Location: Right Arm, Patient Position: Sitting)   Pulse 92   Temp 98.8 F (37.1 C) (Tympanic)   Resp 18   Wt 142 lb 9 oz (64.7 kg)   BMI 21.68 kg/m   Physical Exam  Constitutional: She is oriented to person, place, and time and well-developed, well-nourished, and in no distress.  HENT:  Head: Normocephalic and atraumatic.  Eyes: Pupils are equal, round, and reactive to light. EOM are normal. No scleral icterus.  Blue eyes  Neck: Normal range of motion. Neck supple. No tracheal deviation present. No thyromegaly present.  Cardiovascular: Normal rate, regular rhythm and normal heart sounds. Exam reveals no gallop and no friction rub.  No murmur heard. Pulmonary/Chest: Effort normal and breath sounds normal. No respiratory distress. She has no wheezes. She has no rales.  Abdominal: Soft. Bowel sounds are normal. She exhibits no distension. There is no tenderness.  Musculoskeletal: Normal range of motion. She exhibits no edema or tenderness.  Lymphadenopathy:    She has no cervical adenopathy.    She has no axillary adenopathy.       Right: No inguinal and no supraclavicular adenopathy present.       Left: No inguinal and no supraclavicular adenopathy present.  Neurological: She is alert and oriented to person, place, and time.  Skin: Skin is warm and dry. No rash noted. No erythema.  Psychiatric: Mood, affect and judgment normal.  Nursing note and vitals reviewed.   Infusion on 10/29/2017  Component Date Value Ref Range Status  . Sodium 10/29/2017 136  135 - 145 mmol/L Final  . Potassium 10/29/2017 3.5  3.5 - 5.1 mmol/L Final  . Chloride 10/29/2017 103  98 - 111 mmol/L Final   Please note change in reference range.  . CO2 10/29/2017 23   22 - 32 mmol/L Final  . Glucose, Bld 10/29/2017 245* 70 - 99 mg/dL Final   Please note change in reference range.  . BUN 10/29/2017 11  8 - 23 mg/dL Final   Please  note change in reference range.  . Creatinine, Ser 10/29/2017 0.76  0.44 - 1.00 mg/dL Final  . Calcium 10/29/2017 9.3  8.9 - 10.3 mg/dL Final  . Total Protein 10/29/2017 6.6  6.5 - 8.1 g/dL Final  . Albumin 10/29/2017 4.0  3.5 - 5.0 g/dL Final  . AST 10/29/2017 31  15 - 41 U/L Final  . ALT 10/29/2017 26  0 - 44 U/L Final   Please note change in reference range.  . Alkaline Phosphatase 10/29/2017 52  38 - 126 U/L Final  . Total Bilirubin 10/29/2017 0.8  0.3 - 1.2 mg/dL Final  . GFR calc non Af Amer 10/29/2017 >60  >60 mL/min Final  . GFR calc Af Amer 10/29/2017 >60  >60 mL/min Final   Comment: (NOTE) The eGFR has been calculated using the CKD EPI equation. This calculation has not been validated in all clinical situations. eGFR's persistently <60 mL/min signify possible Chronic Kidney Disease.   Georgiann Hahn gap 10/29/2017 10  5 - 15 Final   Performed at [facility], [address] [place], [place] [address]  . WBC 10/29/2017 2.7* 3.6 - 11.0 K/uL Final  . RBC 10/29/2017 2.97* 3.80 - 5.20 MIL/uL Final  . Hemoglobin 10/29/2017 10.0* 12.0 - 16.0 g/dL Final  . HCT 10/29/2017 28.8* 35.0 - 47.0 % Final  . MCV 10/29/2017 97.2  80.0 - 100.0 fL Final  . MCH 10/29/2017 33.6  26.0 - 34.0 pg Final  . MCHC 10/29/2017 34.6  32.0 - 36.0 g/dL Final  . RDW 10/29/2017 18.1* 11.5 - 14.5 % Final  . Platelets 10/29/2017 152  150 - 440 K/uL Final  . Neutrophils Relative % 10/29/2017 60  % Final  . Neutro Abs 10/29/2017 1.6  1.4 - 6.5 K/uL Final  . Lymphocytes Relative 10/29/2017 18  % Final  . Lymphs Abs 10/29/2017 0.5* 1.0 - 3.6 K/uL Final  . Monocytes Relative 10/29/2017 19  % Final  . Monocytes Absolute 10/29/2017 0.5  0.2 - 0.9 K/uL Final  . Eosinophils Relative 10/29/2017 2  % Final  . Eosinophils Absolute 10/29/2017 0.1  0 - 0.7  K/uL Final  . Basophils Relative 10/29/2017 1  % Final  . Basophils Absolute 10/29/2017 0.0  0 - 0.1 K/uL Final   Performed at [facility], [address] [place], [place] [address]  . Magnesium 10/29/2017 1.8  1.7 - 2.4 mg/dL Final   Performed at [facility], [address] [place], [place] [address]    Assessment:  Stephanie Sanders is a 72 y.o. 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  Pathology revealed two grade II invasive mammary carcinomas of no special type (4 cm from the nipple and 5 cm from the nipple).  Tumors were in close proximity.  Lymph node biopsy confirmed metastatic carcinoma of breast origin.  Tumor is ER negative (< 1%), PR negative and Her 2/neu 2+ by IHC.  [facility] genetic testing on 07/21/2017 revealed a variant of uncertain significance in RECQL4.  Right sided mammogram and ultrasound on 05/21/2017 revealed 2 highly suspicious masses in the right breast at the 10:30 position 4 cm from nipple (1.8 x 1.2 x 1.1 cm) and at the 10:30 position 5 cm from nipple (1.1 x 0.9 x 0.9 cm).  There was a suspicious 2.7 x 1 x 2.2 cm lymph node in the right axilla with asymmetric nodular cortical thickening.  Ultrasound guided biopsy of the 2 masses  and lymph node on 05/28/2017 revealed grade II invasive mammary carcinoma of no special type (4 cm from the nipple and 5 cm from the nipple).  Lymph node biopsy revealed metastatic carcinoma of breast origin.  Tumor is ER negative (< 1%), PR negative and Her 2/neu 2+ by IHC.  FISH was negative.  CA 27.29 was 16.5 and CA15-3 was 17.6 on 06/04/2017.  Echo on 06/09/2017 revealed an EF of 60-65%.  She has enrolled on the UPBEAT clinical trial.  She received 4 cycles of AC (06/23/2017 - 09/08/2017) with Udencya support.  Cycle #1 was complicated by influenza A.  [place] counts included an ANC of 0 and platelet count of 71,000. Cycle #2 postponed due to slow recovery from  influenza and excisional cyst removal with surrounding cellulitis (treated with 10 days of doxycycline).   She is s/p week #4 Taxol (09/29/2017 - 10/21/2017).  She received GCSF after week #3 Taxol secondary to borderline counts.  Right mammogram and ultrasound on 09/28/2017 revealed the 2 adjacent masses in the right breast at the 10:30 o'clock position had both decreased in size (1.8 x 1.1 x 1.2 cm to 1.2 x 0.7 x 1.1 cm; 11 x 9 x 9 mm to 7 x 6 x 6 mm) as well as the metastatic right axillary lymph node (2 .7 cm to 1.6 x 0.9 x 1.7 cm).    She was [facility] to [facility] from 06/28/2017 - 07/01/2017 with with fever, cough and generalized weakness.  Nasal swab was + for influenza A.  She completed a course of Tamiflu.  Symptomatically, patient is doing well today. She notes feeling "rough" for 4-5 days following chemo. Post-treatment she feels fatigued. She has stable neuropathy in her LEFT hand and foot. Exam is benign.  WBC is 2700 with an ANC of 1600.  Platelet count is 152,000. Blood sugar 245.  Plan: 1. Labs today:  CBC with diff, CMP, Mg. 2. Labs reviewed. Blood counts stable and adequate enough for treatment. Patient requesting to go back to her Tuesday schedule. Wishes to postpone treatment today in order to "get back on track".  3. Review need for GCSF to maintain counts and continue with chemotherapy.  Patient to utilize loratadine to prevent bone pain. Discuss symptom management.  Patient has antiemetics and pain medications at home to use on a PRN basis. Patient  advising that the  prescribed interventions are adequate at this point. Continue all medications as previously prescribed.  Discuss neuropathy. Patient notes that her symptoms are stable and not limiting her function at this time. Will continue to monitor.  RTC on 11/02/2017 for labs (CBC with diff) and +/- GCSF RTC on 11/03/2017 for MD assessment, labs (CBC with diff, CMP, Mg) and week #5 Taxol. RTC on 11/09/2017 for labs (CBC with  diff) and +/- GCSF RTC on 11/10/2017 for MD assessment, labs (CBC with diff, CMP, Mg) and week #6 Taxol.  RTC on 11/16/2017 for labs (CBC with diff) and +/- GCSF RTC on 11/17/2017 for MD assessment, labs (CBC with diff, CMP, MG) and +/- additional carboplatin + Taxol.   Honor Loh, NP 10/28/2017,3:13 PM

## 2017-10-29 ENCOUNTER — Inpatient Hospital Stay: Payer: Medicare Other

## 2017-10-29 ENCOUNTER — Inpatient Hospital Stay (HOSPITAL_BASED_OUTPATIENT_CLINIC_OR_DEPARTMENT_OTHER): Payer: Medicare Other | Admitting: Urgent Care

## 2017-10-29 VITALS — BP 144/86 | HR 92 | Temp 98.8°F | Resp 18 | Wt 142.6 lb

## 2017-10-29 DIAGNOSIS — Z5111 Encounter for antineoplastic chemotherapy: Secondary | ICD-10-CM | POA: Diagnosis not present

## 2017-10-29 DIAGNOSIS — C50911 Malignant neoplasm of unspecified site of right female breast: Secondary | ICD-10-CM

## 2017-10-29 DIAGNOSIS — E119 Type 2 diabetes mellitus without complications: Secondary | ICD-10-CM

## 2017-10-29 DIAGNOSIS — R5383 Other fatigue: Secondary | ICD-10-CM

## 2017-10-29 DIAGNOSIS — C50411 Malignant neoplasm of upper-outer quadrant of right female breast: Secondary | ICD-10-CM

## 2017-10-29 DIAGNOSIS — T451X5A Adverse effect of antineoplastic and immunosuppressive drugs, initial encounter: Secondary | ICD-10-CM

## 2017-10-29 DIAGNOSIS — Z171 Estrogen receptor negative status [ER-]: Secondary | ICD-10-CM

## 2017-10-29 DIAGNOSIS — Z87891 Personal history of nicotine dependence: Secondary | ICD-10-CM

## 2017-10-29 DIAGNOSIS — G629 Polyneuropathy, unspecified: Secondary | ICD-10-CM

## 2017-10-29 DIAGNOSIS — D701 Agranulocytosis secondary to cancer chemotherapy: Secondary | ICD-10-CM

## 2017-10-29 DIAGNOSIS — C773 Secondary and unspecified malignant neoplasm of axilla and upper limb lymph nodes: Secondary | ICD-10-CM | POA: Diagnosis not present

## 2017-10-29 DIAGNOSIS — I10 Essential (primary) hypertension: Secondary | ICD-10-CM | POA: Diagnosis not present

## 2017-10-29 DIAGNOSIS — C50211 Malignant neoplasm of upper-inner quadrant of right female breast: Secondary | ICD-10-CM

## 2017-10-29 LAB — CBC WITH DIFFERENTIAL/PLATELET
Basophils Absolute: 0 10*3/uL (ref 0–0.1)
Basophils Relative: 1 %
Eosinophils Absolute: 0.1 10*3/uL (ref 0–0.7)
Eosinophils Relative: 2 %
HCT: 28.8 % — ABNORMAL LOW (ref 35.0–47.0)
Hemoglobin: 10 g/dL — ABNORMAL LOW (ref 12.0–16.0)
Lymphocytes Relative: 18 %
Lymphs Abs: 0.5 10*3/uL — ABNORMAL LOW (ref 1.0–3.6)
MCH: 33.6 pg (ref 26.0–34.0)
MCHC: 34.6 g/dL (ref 32.0–36.0)
MCV: 97.2 fL (ref 80.0–100.0)
Monocytes Absolute: 0.5 10*3/uL (ref 0.2–0.9)
Monocytes Relative: 19 %
Neutro Abs: 1.6 10*3/uL (ref 1.4–6.5)
Neutrophils Relative %: 60 %
Platelets: 152 10*3/uL (ref 150–440)
RBC: 2.97 MIL/uL — ABNORMAL LOW (ref 3.80–5.20)
RDW: 18.1 % — ABNORMAL HIGH (ref 11.5–14.5)
WBC: 2.7 10*3/uL — ABNORMAL LOW (ref 3.6–11.0)

## 2017-10-29 LAB — COMPREHENSIVE METABOLIC PANEL
ALT: 26 U/L (ref 0–44)
AST: 31 U/L (ref 15–41)
Albumin: 4 g/dL (ref 3.5–5.0)
Alkaline Phosphatase: 52 U/L (ref 38–126)
Anion gap: 10 (ref 5–15)
BUN: 11 mg/dL (ref 8–23)
CO2: 23 mmol/L (ref 22–32)
Calcium: 9.3 mg/dL (ref 8.9–10.3)
Chloride: 103 mmol/L (ref 98–111)
Creatinine, Ser: 0.76 mg/dL (ref 0.44–1.00)
GFR calc Af Amer: >60 mL/min (ref >60)
GFR calc non Af Amer: >60 mL/min (ref >60)
Glucose, Bld: 245 mg/dL — ABNORMAL HIGH (ref 70–99)
Potassium: 3.5 mmol/L (ref 3.5–5.1)
Sodium: 136 mmol/L (ref 135–145)
Total Bilirubin: 0.8 mg/dL (ref 0.3–1.2)
Total Protein: 6.6 g/dL (ref 6.5–8.1)

## 2017-10-29 LAB — MAGNESIUM: Magnesium: 1.8 mg/dL (ref 1.7–2.4)

## 2017-10-29 MED ORDER — HEPARIN SOD (PORK) LOCK FLUSH 100 UNIT/ML IV SOLN
500.0000 [IU] | Freq: Once | INTRAVENOUS | Status: AC
  Administered 2017-10-29: 500 [IU] via INTRAVENOUS
  Filled 2017-10-29: qty 5

## 2017-10-29 MED ORDER — SODIUM CHLORIDE 0.9% FLUSH
10.0000 mL | INTRAVENOUS | Status: DC | PRN
  Administered 2017-10-29: 10 mL via INTRAVENOUS
  Filled 2017-10-29: qty 10

## 2017-10-29 NOTE — Progress Notes (Signed)
Patient states she has had one episode of nausea that lasted about an hour.  She also c/o loose stools.  Advised her to do her Miralax every other day instead of daily.  Patient accompanied by her son today.

## 2017-11-02 ENCOUNTER — Inpatient Hospital Stay: Payer: Medicare Other

## 2017-11-02 ENCOUNTER — Telehealth: Payer: Self-pay | Admitting: *Deleted

## 2017-11-02 ENCOUNTER — Other Ambulatory Visit: Payer: Self-pay

## 2017-11-02 DIAGNOSIS — Z5111 Encounter for antineoplastic chemotherapy: Secondary | ICD-10-CM | POA: Diagnosis not present

## 2017-11-02 DIAGNOSIS — Z171 Estrogen receptor negative status [ER-]: Principal | ICD-10-CM

## 2017-11-02 DIAGNOSIS — C50911 Malignant neoplasm of unspecified site of right female breast: Secondary | ICD-10-CM

## 2017-11-02 LAB — CBC WITH DIFFERENTIAL/PLATELET
BASOS ABS: 0 10*3/uL (ref 0–0.1)
Basophils Relative: 1 %
EOS ABS: 0.1 10*3/uL (ref 0–0.7)
EOS PCT: 2 %
HCT: 32.1 % — ABNORMAL LOW (ref 35.0–47.0)
Hemoglobin: 10.8 g/dL — ABNORMAL LOW (ref 12.0–16.0)
LYMPHS PCT: 14 %
Lymphs Abs: 0.5 10*3/uL — ABNORMAL LOW (ref 1.0–3.6)
MCH: 33 pg (ref 26.0–34.0)
MCHC: 33.6 g/dL (ref 32.0–36.0)
MCV: 98.2 fL (ref 80.0–100.0)
MONO ABS: 0.4 10*3/uL (ref 0.2–0.9)
Monocytes Relative: 10 %
Neutro Abs: 2.7 10*3/uL (ref 1.4–6.5)
Neutrophils Relative %: 73 %
PLATELETS: 144 10*3/uL — AB (ref 150–440)
RBC: 3.27 MIL/uL — ABNORMAL LOW (ref 3.80–5.20)
RDW: 17.8 % — AB (ref 11.5–14.5)
WBC: 3.7 10*3/uL (ref 3.6–11.0)

## 2017-11-02 LAB — COMPREHENSIVE METABOLIC PANEL
ALT: 24 U/L (ref 0–44)
AST: 25 U/L (ref 15–41)
Albumin: 4.1 g/dL (ref 3.5–5.0)
Alkaline Phosphatase: 45 U/L (ref 38–126)
Anion gap: 9 (ref 5–15)
BILIRUBIN TOTAL: 0.7 mg/dL (ref 0.3–1.2)
BUN: 11 mg/dL (ref 8–23)
CALCIUM: 9.2 mg/dL (ref 8.9–10.3)
CHLORIDE: 104 mmol/L (ref 98–111)
CO2: 26 mmol/L (ref 22–32)
CREATININE: 0.65 mg/dL (ref 0.44–1.00)
Glucose, Bld: 125 mg/dL — ABNORMAL HIGH (ref 70–99)
Potassium: 4 mmol/L (ref 3.5–5.1)
Sodium: 139 mmol/L (ref 135–145)
TOTAL PROTEIN: 6.7 g/dL (ref 6.5–8.1)

## 2017-11-02 LAB — MAGNESIUM: MAGNESIUM: 1.8 mg/dL (ref 1.7–2.4)

## 2017-11-02 NOTE — Progress Notes (Signed)
On 7/15/2019RTC [facility]:  11/03/2017   Chief Complaint: Stephanie Sanders is a 72 y.o. female with multi-focal right breast cancer who is seen for assessment prior to week #5 Taxol.  HPI:  The patient was last seen in the medical oncology clinic on 10/29/2017.  At that time, patient noted that the first week following chemotherapy was "rough". She experienced marked fatigued, increased bowel gas, and decreased oral intake. She felt well in clinic that day. She pain pain in her back following the GCSF injection that improved with the recommended loratadine.  Exam was benign.  WBC 2700 with an ANC of 1600.  Platelets 152,000. Blood sugar 245. Week #5 Taxol was held due to patient's preference to be treated on Tuesdays.   CBC on 11/02/2017 revealed a WBC of 3700 ([place] 2700). Hemoglobin 10.8, hematocrit 32.1, MCV 98.2, and platelets 144,000. Chemistries and magnesium normal.   In the interim, patient has continued to improve. She has no acute complaints today. She began taking psyllium fiber for her bowel, which was noted to be effective in helping her to have regular bowel movements. Patient restarted her Welchol, which made her have constipation again. She took dulcolax this morning and her bowels have moved already.   Patient denies that she has experienced any B symptoms. She denies any interval infections. 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 Patient advises that she maintains an adequate appetite. She is eating well. Weight today is 142 lb 2 oz (64.5 kg), which compared to her last visit to the clinic, represents a stable weight.  Patient denies pain in the clinic today.  Past Medical History:  Diagnosis Date  . Cancer ([place])   . Diabetes mellitus without complication ([place])   . Diabetic nephropathy ([place])   . Headache    migraines  . Heart murmur    ASYMPTOMATIC  . Hypertension   . Hypothyroidism   . Personal history of  chemotherapy 2019   right breast cancer  . Seborrheic keratosis   . Vitamin D deficiency     Past Surgical History:  Procedure Laterality Date  . APPENDECTOMY    . BREAST BIOPSY Right 05/28/2017   right breast bx 3 areas invasive mamm ca lymph node mets  . BREAST CYST ASPIRATION Bilateral    neg  . CHOLECYSTECTOMY    . COLONOSCOPY WITH PROPOFOL N/A 01/01/2015   Procedure: COLONOSCOPY WITH PROPOFOL;  Surgeon: Josefine Class, MD;  Location: [facility] ENDOSCOPY;  Service: Endoscopy;  Laterality: N/A;  . EYE SURGERY     eyelid  . PORTACATH PLACEMENT Right 06/10/2017   Procedure: INSERTION PORT-A-CATH;  Surgeon: Herbert Pun, MD;  Location: [facility] ORS;  Service: General;  Laterality: Right;  . STENT PLACEMENT ILIAC ([place] HX)    . TUBAL LIGATION      Family History  Problem Relation Age of Onset  . Cancer Mother   . Cancer Maternal Aunt   . Breast cancer Neg Hx     Social History:  reports that she quit smoking about 45 years ago. Her smoking use included cigarettes. She has a 20.00 pack-year smoking history. She has never used smokeless tobacco. She reports that she does not drink alcohol or use drugs.  She has 3 children (daughters: age 27 and 8; son: age 55).  She is retired.  She worked for the [employer].  Her husband has dementia.  She lives in [place].  She is accompanied by her son,  Jaquelyn Bitter, today.  Allergies:  Allergies  Allergen Reactions  . Fiorinal [Butalbital-Aspirin-Caffeine] Nausea And Vomiting and Other (See Comments)    SEVERE HEADACHE  . Sulfa Antibiotics Other (See Comments)    Burning esophagus and stomach  . Tramadol Nausea And Vomiting  . Other Rash    STERI-STRIPS-RASH STERI-STRIPS-RASH    Current Medications: Current Outpatient Medications  Medication Sig Dispense Refill  . acetaminophen (TYLENOL) 500 MG tablet Take 1,000 mg by mouth every 6 (six) hours as needed (for headaches.).    Marland Kitchen atorvastatin (LIPITOR) 10 MG tablet Take 10 mg by mouth every  evening.     . Cholecalciferol 2000 UNITS CAPS Take 4,000 Units by mouth daily.     . colesevelam (WELCHOL) 625 MG tablet Take 1,875 mg by mouth every evening.     Marland Kitchen dexamethasone (DECADRON) 4 MG tablet Take 2 tablets by mouth once a day on the day after chemotherapy and then take 2 tablets two times a day for 2 days. Take with food. 30 tablet 1  . levothyroxine (SYNTHROID, LEVOTHROID) 50 MCG tablet Take 50 mcg by mouth daily before breakfast.    . lidocaine-prilocaine (EMLA) cream Apply 1 application topically as needed. 30 g 6  . loratadine (CLARITIN) 10 MG tablet Take 10 mg by mouth daily.    Marland Kitchen LORazepam (ATIVAN) 0.5 MG tablet Take 1 tablet (0.5 mg total) by mouth every 6 (six) hours as needed (Nausea or vomiting). 30 tablet 0  . MAGNESIUM GLYCINATE PLUS PO Take 100 mg by mouth.    . Melatonin 5 MG TABS Take 5 mg by mouth at bedtime as needed (for sleep.).    Marland Kitchen metFORMIN (GLUCOPHAGE) 1000 MG tablet Take 1,000 mg by mouth 2 (two) times daily.    . ondansetron (ZOFRAN) 8 MG tablet Take 1 tablet (8 mg total) by mouth 2 (two) times daily as needed. Start on the third day after chemotherapy. 30 tablet 1  . polyethylene glycol (MIRALAX / GLYCOLAX) packet Take 17 g by mouth daily.    . quinapril (ACCUPRIL) 5 MG tablet Take 5 mg by mouth every morning.     . sodium bicarbonate 650 MG tablet Take 1,300 mg by mouth at bedtime.    Marland Kitchen aspirin-acetaminophen-caffeine (EXCEDRIN MIGRAINE) 250-250-65 MG tablet Take 2 tablets by mouth every 6 (six) hours as needed for headache.    . nystatin (MYCOSTATIN) 100000 UNIT/ML suspension Take 5 mLs (500,000 Units total) by mouth 4 (four) times daily. (Patient not taking: Reported on 10/29/2017) 240 mL 0   No current facility-administered medications for this visit.    Facility-Administered Medications Ordered in Other Visits  Medication Dose Route Frequency Provider Last Rate Last Dose  . sodium chloride flush (NS) 0.9 % injection 10 mL  10 mL Intravenous PRN Lequita Asal, MD   10 mL at 10/21/17 0841    Review of Systems  Constitutional: Positive for malaise/fatigue. Negative for diaphoresis, fever and weight loss (stable).       "Im doing ok today".   HENT: Negative.   Eyes: Negative.   Respiratory: Negative for cough, hemoptysis, sputum production and shortness of breath.   Cardiovascular: Positive for leg swelling. Negative for chest pain, palpitations, orthopnea and PND.  Gastrointestinal: Positive for constipation (varying bowel habits), diarrhea (varying bowel habits) and heartburn. Negative for abdominal pain, blood in stool, melena, nausea and vomiting.  Genitourinary: Negative for dysuria, frequency, hematuria and urgency.  Musculoskeletal: Positive for back pain (associated with GCSF injections - resolved). Negative  for falls, joint pain and myalgias.  Skin: Negative for itching and rash.  Neurological: Positive for tingling (minor neuropathy (stable) in LEFT hand and foot only - intermittent and does not affect function) and sensory change. Negative for dizziness, tremors, weakness and headaches.  Endo/Heme/Allergies: Does not bruise/bleed easily.       Diabetes - on Metformin  Psychiatric/Behavioral: Negative for depression, memory loss and suicidal ideas. The patient is not nervous/anxious and does not have insomnia.   All other systems reviewed and are negative.  Performance status (ECOG): 1 - Symptomatic but completely ambulatory  Vital signs BP 126/82 (BP Location: Right Arm) Comment: Recheck  Pulse 81 Comment: Recheck  Temp 97.9 F (36.6 C) (Tympanic)   Resp 18   Wt 142 lb 2 oz (64.5 kg)   BMI 21.61 kg/m   Physical Exam  Constitutional: She is oriented to person, place, and time and well-developed, well-nourished, and in no distress.  HENT:  Head: Normocephalic and atraumatic.  Eyes: Pupils are equal, round, and reactive to light. EOM are normal. No scleral icterus.  Blue eyes.   Neck: Normal range of motion. Neck  supple. No tracheal deviation present. No thyromegaly present.  Cardiovascular: Normal rate, regular rhythm and normal heart sounds. Exam reveals no gallop and no friction rub.  No murmur heard. Pulmonary/Chest: Effort normal and breath sounds normal. No respiratory distress. She has no wheezes. She has no rales.  Abdominal: Soft. Bowel sounds are normal. She exhibits no distension. There is no tenderness.  Musculoskeletal: Normal range of motion. She exhibits no edema or tenderness.  Lymphadenopathy:    She has no cervical adenopathy.    She has no axillary adenopathy.       Right: No inguinal and no supraclavicular adenopathy present.       Left: No inguinal and no supraclavicular adenopathy present.  Neurological: She is alert and oriented to person, place, and time.  Skin: Skin is warm and dry. No rash noted. No erythema.  Psychiatric: Mood, affect and judgment normal.  Nursing note and vitals reviewed.   Orders Only on 11/02/2017  Component Date Value Ref Range Status  . Magnesium 11/02/2017 1.8  1.7 - 2.4 mg/dL Final   Performed at [facility], [address] [place], [place] [address]  . Sodium 11/02/2017 139  135 - 145 mmol/L Final  . Potassium 11/02/2017 4.0  3.5 - 5.1 mmol/L Final  . Chloride 11/02/2017 104  98 - 111 mmol/L Final   Please note change in reference range.  . CO2 11/02/2017 26  22 - 32 mmol/L Final  . Glucose, Bld 11/02/2017 125* 70 - 99 mg/dL Final   Please note change in reference range.  . BUN 11/02/2017 11  8 - 23 mg/dL Final   Please note change in reference range.  . Creatinine, Ser 11/02/2017 0.65  0.44 - 1.00 mg/dL Final  . Calcium 11/02/2017 9.2  8.9 - 10.3 mg/dL Final  . Total Protein 11/02/2017 6.7  6.5 - 8.1 g/dL Final  . Albumin 11/02/2017 4.1  3.5 - 5.0 g/dL Final  . AST 11/02/2017 25  15 - 41 U/L Final  . ALT 11/02/2017 24  0 - 44 U/L Final   Please note change in reference range.  . Alkaline Phosphatase 11/02/2017 45  38 - 126  U/L Final  . Total Bilirubin 11/02/2017 0.7  0.3 - 1.2 mg/dL Final  . GFR calc non Af Amer 11/02/2017 >60  >60 mL/min Final  . GFR calc  Af Amer 11/02/2017 >60  >60 mL/min Final   Comment: (NOTE) The eGFR has been calculated using the CKD EPI equation. This calculation has not been validated in all clinical situations. eGFR's persistently <60 mL/min signify possible Chronic Kidney Disease.   Georgiann Hahn gap 11/02/2017 9  5 - 15 Final   Performed at [facility], [address] [place], [place] [address]  . WBC 11/02/2017 3.7  3.6 - 11.0 K/uL Final  . RBC 11/02/2017 3.27* 3.80 - 5.20 MIL/uL Final  . Hemoglobin 11/02/2017 10.8* 12.0 - 16.0 g/dL Final  . HCT 11/02/2017 32.1* 35.0 - 47.0 % Final  . MCV 11/02/2017 98.2  80.0 - 100.0 fL Final  . MCH 11/02/2017 33.0  26.0 - 34.0 pg Final  . MCHC 11/02/2017 33.6  32.0 - 36.0 g/dL Final  . RDW 11/02/2017 17.8* 11.5 - 14.5 % Final  . Platelets 11/02/2017 144* 150 - 440 K/uL Final  . Neutrophils Relative % 11/02/2017 73  % Final  . Neutro Abs 11/02/2017 2.7  1.4 - 6.5 K/uL Final  . Lymphocytes Relative 11/02/2017 14  % Final  . Lymphs Abs 11/02/2017 0.5* 1.0 - 3.6 K/uL Final  . Monocytes Relative 11/02/2017 10  % Final  . Monocytes Absolute 11/02/2017 0.4  0.2 - 0.9 K/uL Final  . Eosinophils Relative 11/02/2017 2  % Final  . Eosinophils Absolute 11/02/2017 0.1  0 - 0.7 K/uL Final  . Basophils Relative 11/02/2017 1  % Final  . Basophils Absolute 11/02/2017 0.0  0 - 0.1 K/uL Final   Performed at [facility], [address] [place], [place] [address]    Assessment:  ANNASTACIA DUBA is a 72 y.o. 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  Pathology revealed two grade II invasive mammary carcinomas of no special type (4 cm from the nipple and 5 cm from the nipple).  Tumors were in close proximity.  Lymph node biopsy confirmed metastatic carcinoma of breast origin.   Tumor is ER negative (< 1%), PR negative and Her 2/neu 2+ by IHC.  [facility] genetic testing on 07/21/2017 revealed a variant of uncertain significance in RECQL4.  Right sided mammogram and ultrasound on 05/21/2017 revealed 2 highly suspicious masses in the right breast at the 10:30 position 4 cm from nipple (1.8 x 1.2 x 1.1 cm) and at the 10:30 position 5 cm from nipple (1.1 x 0.9 x 0.9 cm).  There was a suspicious 2.7 x 1 x 2.2 cm lymph node in the right axilla with asymmetric nodular cortical thickening.  Ultrasound guided biopsy of the 2 masses and lymph node on 05/28/2017 revealed grade II invasive mammary carcinoma of no special type (4 cm from the nipple and 5 cm from the nipple).  Lymph node biopsy revealed metastatic carcinoma of breast origin.  Tumor is ER negative (< 1%), PR negative and Her 2/neu 2+ by IHC.  FISH was negative.  CA 27.29 was 16.5 and CA15-3 was 17.6 on 06/04/2017.  Echo on 06/09/2017 revealed an EF of 60-65%.  She has enrolled on the UPBEAT clinical trial.  She received 4 cycles of AC (06/23/2017 - 09/08/2017) with Udencya support.  Cycle #1 was complicated by influenza A.  Nadir counts included an ANC of 0 and platelet count of 71,000. Cycle #2 postponed due to slow recovery from influenza and excisional cyst removal with surrounding cellulitis (treated with 10 days of doxycycline).   She is s/p week #4  Taxol (09/29/2017 - 10/21/2017).  She received GCSF after week #3 Taxol secondary to borderline counts. Cycle #5 was postponed on 10/29/2017 due to patient's wishes to be treated on Tuesdays only.   Right mammogram and ultrasound on 09/28/2017 revealed the 2 adjacent masses in the right breast at the 10:30 o'clock position had both decreased in size (1.8 x 1.1 x 1.2 cm to 1.2 x 0.7 x 1.1 cm; 11 x 9 x 9 mm to 7 x 6 x 6 mm) as well as the metastatic right axillary lymph node (2 .7 cm to 1.6 x 0.9 x 1.7 cm).    She was admitted to [facility] from 06/28/2017 - 07/01/2017 with with  fever, cough and generalized weakness.  Nasal swab was + for influenza A.  She completed a course of Tamiflu.  Symptomatically, patient is doing well today. She has no acute concerns. Patient has some minor fatigue. She continues to struggle with her bowel habits (see HPI). 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 Exam is benign.  WBC 3700 with an [place] 2700.  Platelets 144,000.  Renal and hepatic function normal.  Magnesium stable at 1.8.  Plan: 1. Labs today:  CBC with diff, CMP, Mg 2. Labs reviewed. Blood counts stable and adequate enough for treatment. Will proceed with week #5 Taxol.  3. Review need for GCSF to maintain counts and continue with chemotherapy.  Patient to utilize loratadine to prevent bone pain. Discuss symptom management.  Patient has antiemetics and pain medications at home to use on a PRN basis. Patient  advising that the  prescribed interventions are adequate at this point. Continue all medications as previously prescribed.  Discuss neuropathy. Patient notes that her symptoms are stable and not limiting her function at this time. Will continue to monitor.  RTC on 11/09/2017 for labs (CBC with diff) and +/- GCSF RTC on 11/10/2017 for MD assessment, labs (CBC with diff, CMP, Mg) and week #6 Taxol.  RTC on 11/16/2017 for labs (CBC with diff) and +/- GCSF RTC on 11/17/2017 for MD assessment, labs (CBC with diff, CMP, Mg all) and +/- additional carboplatin + Taxol.   Honor Loh, NP 11/03/2017,10:08 PM

## 2017-11-02 NOTE — Telephone Encounter (Signed)
[place] 2.7  Honor Loh, NP notified.

## 2017-11-03 ENCOUNTER — Inpatient Hospital Stay: Payer: Medicare Other

## 2017-11-03 ENCOUNTER — Inpatient Hospital Stay (HOSPITAL_BASED_OUTPATIENT_CLINIC_OR_DEPARTMENT_OTHER): Payer: Medicare Other | Admitting: Urgent Care

## 2017-11-03 VITALS — BP 126/82 | HR 81 | Temp 97.9°F | Resp 18 | Wt 142.1 lb

## 2017-11-03 DIAGNOSIS — I10 Essential (primary) hypertension: Secondary | ICD-10-CM

## 2017-11-03 DIAGNOSIS — Z87891 Personal history of nicotine dependence: Secondary | ICD-10-CM

## 2017-11-03 DIAGNOSIS — C50911 Malignant neoplasm of unspecified site of right female breast: Secondary | ICD-10-CM

## 2017-11-03 DIAGNOSIS — G629 Polyneuropathy, unspecified: Secondary | ICD-10-CM | POA: Diagnosis not present

## 2017-11-03 DIAGNOSIS — C50411 Malignant neoplasm of upper-outer quadrant of right female breast: Secondary | ICD-10-CM | POA: Diagnosis not present

## 2017-11-03 DIAGNOSIS — Z171 Estrogen receptor negative status [ER-]: Principal | ICD-10-CM

## 2017-11-03 DIAGNOSIS — T451X5A Adverse effect of antineoplastic and immunosuppressive drugs, initial encounter: Secondary | ICD-10-CM

## 2017-11-03 DIAGNOSIS — Z5111 Encounter for antineoplastic chemotherapy: Secondary | ICD-10-CM | POA: Diagnosis not present

## 2017-11-03 DIAGNOSIS — E119 Type 2 diabetes mellitus without complications: Secondary | ICD-10-CM | POA: Diagnosis not present

## 2017-11-03 DIAGNOSIS — C773 Secondary and unspecified malignant neoplasm of axilla and upper limb lymph nodes: Secondary | ICD-10-CM | POA: Diagnosis not present

## 2017-11-03 DIAGNOSIS — C50211 Malignant neoplasm of upper-inner quadrant of right female breast: Secondary | ICD-10-CM

## 2017-11-03 DIAGNOSIS — D696 Thrombocytopenia, unspecified: Secondary | ICD-10-CM

## 2017-11-03 DIAGNOSIS — D701 Agranulocytosis secondary to cancer chemotherapy: Secondary | ICD-10-CM

## 2017-11-03 DIAGNOSIS — G620 Drug-induced polyneuropathy: Secondary | ICD-10-CM

## 2017-11-03 MED ORDER — SODIUM CHLORIDE 0.9 % IV SOLN
20.0000 mg | Freq: Once | INTRAVENOUS | Status: AC
  Administered 2017-11-03: 20 mg via INTRAVENOUS
  Filled 2017-11-03: qty 2

## 2017-11-03 MED ORDER — SODIUM CHLORIDE 0.9% FLUSH
10.0000 mL | Freq: Once | INTRAVENOUS | Status: AC
  Administered 2017-11-03: 10 mL via INTRAVENOUS
  Filled 2017-11-03: qty 10

## 2017-11-03 MED ORDER — FAMOTIDINE IN NACL 20-0.9 MG/50ML-% IV SOLN
20.0000 mg | Freq: Once | INTRAVENOUS | Status: AC
  Administered 2017-11-03: 20 mg via INTRAVENOUS
  Filled 2017-11-03: qty 50

## 2017-11-03 MED ORDER — SODIUM CHLORIDE 0.9 % IV SOLN
80.0000 mg/m2 | Freq: Once | INTRAVENOUS | Status: AC
  Administered 2017-11-03: 150 mg via INTRAVENOUS
  Filled 2017-11-03: qty 25

## 2017-11-03 MED ORDER — SODIUM CHLORIDE 0.9 % IV SOLN
Freq: Once | INTRAVENOUS | Status: AC
Start: 2017-11-03 — End: 2017-11-03
  Administered 2017-11-03: 12:00:00 via INTRAVENOUS
  Filled 2017-11-03: qty 1000

## 2017-11-03 MED ORDER — HEPARIN SOD (PORK) LOCK FLUSH 100 UNIT/ML IV SOLN
500.0000 [IU] | Freq: Once | INTRAVENOUS | Status: AC
  Administered 2017-11-03: 500 [IU] via INTRAVENOUS

## 2017-11-03 MED ORDER — DIPHENHYDRAMINE HCL 50 MG/ML IJ SOLN
50.0000 mg | Freq: Once | INTRAMUSCULAR | Status: AC
  Administered 2017-11-03: 50 mg via INTRAVENOUS
  Filled 2017-11-03: qty 1

## 2017-11-03 NOTE — Progress Notes (Signed)
Patient offers no complaints today. 

## 2017-11-09 ENCOUNTER — Inpatient Hospital Stay: Payer: Medicare Other

## 2017-11-09 DIAGNOSIS — C50911 Malignant neoplasm of unspecified site of right female breast: Secondary | ICD-10-CM

## 2017-11-09 DIAGNOSIS — Z171 Estrogen receptor negative status [ER-]: Principal | ICD-10-CM

## 2017-11-09 DIAGNOSIS — Z5111 Encounter for antineoplastic chemotherapy: Secondary | ICD-10-CM | POA: Diagnosis not present

## 2017-11-09 LAB — CBC WITH DIFFERENTIAL/PLATELET
Basophils Absolute: 0 10*3/uL (ref 0–0.1)
Basophils Relative: 1 %
EOS PCT: 3 %
Eosinophils Absolute: 0.1 10*3/uL (ref 0–0.7)
HCT: 34 % — ABNORMAL LOW (ref 35.0–47.0)
HEMOGLOBIN: 11.6 g/dL — AB (ref 12.0–16.0)
LYMPHS ABS: 0.6 10*3/uL — AB (ref 1.0–3.6)
LYMPHS PCT: 21 %
MCH: 33.1 pg (ref 26.0–34.0)
MCHC: 34 g/dL (ref 32.0–36.0)
MCV: 97.3 fL (ref 80.0–100.0)
MONOS PCT: 11 %
Monocytes Absolute: 0.3 10*3/uL (ref 0.2–0.9)
Neutro Abs: 1.9 10*3/uL (ref 1.4–6.5)
Neutrophils Relative %: 64 %
Platelets: 174 10*3/uL (ref 150–440)
RBC: 3.49 MIL/uL — AB (ref 3.80–5.20)
RDW: 16.7 % — ABNORMAL HIGH (ref 11.5–14.5)
WBC: 3 10*3/uL — ABNORMAL LOW (ref 3.6–11.0)

## 2017-11-09 MED ORDER — TBO-FILGRASTIM 300 MCG/0.5ML ~~LOC~~ SOSY
480.0000 ug | PREFILLED_SYRINGE | Freq: Once | SUBCUTANEOUS | Status: AC
  Administered 2017-11-09: 480 ug via SUBCUTANEOUS

## 2017-11-10 ENCOUNTER — Other Ambulatory Visit: Payer: Self-pay

## 2017-11-10 ENCOUNTER — Inpatient Hospital Stay: Payer: Medicare Other

## 2017-11-10 ENCOUNTER — Inpatient Hospital Stay (HOSPITAL_BASED_OUTPATIENT_CLINIC_OR_DEPARTMENT_OTHER): Payer: Medicare Other | Admitting: Urgent Care

## 2017-11-10 ENCOUNTER — Encounter: Payer: Self-pay | Admitting: Urgent Care

## 2017-11-10 VITALS — BP 121/76 | HR 89 | Temp 98.6°F | Resp 12 | Ht 68.0 in | Wt 141.2 lb

## 2017-11-10 DIAGNOSIS — C773 Secondary and unspecified malignant neoplasm of axilla and upper limb lymph nodes: Secondary | ICD-10-CM

## 2017-11-10 DIAGNOSIS — Z5111 Encounter for antineoplastic chemotherapy: Secondary | ICD-10-CM | POA: Diagnosis not present

## 2017-11-10 DIAGNOSIS — Z87891 Personal history of nicotine dependence: Secondary | ICD-10-CM

## 2017-11-10 DIAGNOSIS — G4700 Insomnia, unspecified: Secondary | ICD-10-CM

## 2017-11-10 DIAGNOSIS — G629 Polyneuropathy, unspecified: Secondary | ICD-10-CM | POA: Diagnosis not present

## 2017-11-10 DIAGNOSIS — D701 Agranulocytosis secondary to cancer chemotherapy: Secondary | ICD-10-CM

## 2017-11-10 DIAGNOSIS — C50411 Malignant neoplasm of upper-outer quadrant of right female breast: Secondary | ICD-10-CM

## 2017-11-10 DIAGNOSIS — C50911 Malignant neoplasm of unspecified site of right female breast: Secondary | ICD-10-CM

## 2017-11-10 DIAGNOSIS — G620 Drug-induced polyneuropathy: Secondary | ICD-10-CM

## 2017-11-10 DIAGNOSIS — I10 Essential (primary) hypertension: Secondary | ICD-10-CM

## 2017-11-10 DIAGNOSIS — E119 Type 2 diabetes mellitus without complications: Secondary | ICD-10-CM

## 2017-11-10 DIAGNOSIS — Z171 Estrogen receptor negative status [ER-]: Secondary | ICD-10-CM

## 2017-11-10 DIAGNOSIS — R110 Nausea: Secondary | ICD-10-CM

## 2017-11-10 DIAGNOSIS — T451X5A Adverse effect of antineoplastic and immunosuppressive drugs, initial encounter: Secondary | ICD-10-CM

## 2017-11-10 DIAGNOSIS — R432 Parageusia: Secondary | ICD-10-CM

## 2017-11-10 DIAGNOSIS — R5383 Other fatigue: Secondary | ICD-10-CM

## 2017-11-10 LAB — CBC WITH DIFFERENTIAL/PLATELET
BASOS ABS: 0.1 10*3/uL (ref 0–0.1)
Basophils Relative: 1 %
Eosinophils Absolute: 0.1 10*3/uL (ref 0–0.7)
Eosinophils Relative: 1 %
HCT: 31.4 % — ABNORMAL LOW (ref 35.0–47.0)
Hemoglobin: 10.7 g/dL — ABNORMAL LOW (ref 12.0–16.0)
Lymphocytes Relative: 5 %
Lymphs Abs: 0.5 10*3/uL — ABNORMAL LOW (ref 1.0–3.6)
MCH: 33.3 pg (ref 26.0–34.0)
MCHC: 34.2 g/dL (ref 32.0–36.0)
MCV: 97.5 fL (ref 80.0–100.0)
MONOS PCT: 7 %
Monocytes Absolute: 0.6 10*3/uL (ref 0.2–0.9)
NEUTROS ABS: 7.8 10*3/uL — AB (ref 1.4–6.5)
Neutrophils Relative %: 86 %
Platelets: 147 10*3/uL — ABNORMAL LOW (ref 150–440)
RBC: 3.22 MIL/uL — ABNORMAL LOW (ref 3.80–5.20)
RDW: 17.1 % — ABNORMAL HIGH (ref 11.5–14.5)
WBC: 9 10*3/uL (ref 3.6–11.0)

## 2017-11-10 LAB — COMPREHENSIVE METABOLIC PANEL
ALT: 27 U/L (ref 0–44)
AST: 28 U/L (ref 15–41)
Albumin: 4.1 g/dL (ref 3.5–5.0)
Alkaline Phosphatase: 53 U/L (ref 38–126)
Anion gap: 9 (ref 5–15)
BILIRUBIN TOTAL: 0.9 mg/dL (ref 0.3–1.2)
BUN: 8 mg/dL (ref 8–23)
CALCIUM: 9.3 mg/dL (ref 8.9–10.3)
CO2: 23 mmol/L (ref 22–32)
Chloride: 104 mmol/L (ref 98–111)
Creatinine, Ser: 0.72 mg/dL (ref 0.44–1.00)
GFR calc Af Amer: >60 mL/min (ref >60)
GFR calc non Af Amer: >60 mL/min (ref >60)
Glucose, Bld: 158 mg/dL — ABNORMAL HIGH (ref 70–99)
Potassium: 3.6 mmol/L (ref 3.5–5.1)
SODIUM: 136 mmol/L (ref 135–145)
TOTAL PROTEIN: 6.4 g/dL — AB (ref 6.5–8.1)

## 2017-11-10 LAB — MAGNESIUM: Magnesium: 2 mg/dL (ref 1.7–2.4)

## 2017-11-10 MED ORDER — SODIUM CHLORIDE 0.9 % IV SOLN
80.0000 mg/m2 | Freq: Once | INTRAVENOUS | Status: AC
  Administered 2017-11-10: 150 mg via INTRAVENOUS
  Filled 2017-11-10: qty 25

## 2017-11-10 MED ORDER — HEPARIN SOD (PORK) LOCK FLUSH 100 UNIT/ML IV SOLN
500.0000 [IU] | Freq: Once | INTRAVENOUS | Status: AC | PRN
  Administered 2017-11-10: 500 [IU]

## 2017-11-10 MED ORDER — SODIUM CHLORIDE 0.9 % IV SOLN
20.0000 mg | Freq: Once | INTRAVENOUS | Status: AC
  Administered 2017-11-10: 20 mg via INTRAVENOUS
  Filled 2017-11-10: qty 2

## 2017-11-10 MED ORDER — SODIUM CHLORIDE 0.9% FLUSH
10.0000 mL | INTRAVENOUS | Status: DC | PRN
  Administered 2017-11-10: 10 mL
  Filled 2017-11-10: qty 10

## 2017-11-10 MED ORDER — HEPARIN SOD (PORK) LOCK FLUSH 100 UNIT/ML IV SOLN
INTRAVENOUS | Status: AC
  Filled 2017-11-10: qty 5

## 2017-11-10 MED ORDER — DIPHENHYDRAMINE HCL 50 MG/ML IJ SOLN
50.0000 mg | Freq: Once | INTRAMUSCULAR | Status: AC
  Administered 2017-11-10: 50 mg via INTRAVENOUS
  Filled 2017-11-10: qty 1

## 2017-11-10 MED ORDER — FAMOTIDINE IN NACL 20-0.9 MG/50ML-% IV SOLN
20.0000 mg | Freq: Once | INTRAVENOUS | Status: AC
  Administered 2017-11-10: 20 mg via INTRAVENOUS
  Filled 2017-11-10: qty 50

## 2017-11-10 MED ORDER — SODIUM CHLORIDE 0.9 % IV SOLN
Freq: Once | INTRAVENOUS | Status: AC
  Administered 2017-11-10: 11:00:00 via INTRAVENOUS
  Filled 2017-11-10: qty 1000

## 2017-11-10 NOTE — Progress Notes (Signed)
Patient here for pre treatment check. No changes. She did get a headache after her injection.

## 2017-11-10 NOTE — Progress Notes (Signed)
On 7/15/2019[facility]:  11/10/2017   Chief Complaint: Stephanie Sanders is a 72 y.o. female with multi-focal right breast cancer who is seen for assessment prior to week #6 Taxol.  HPI:  The patient was last seen in the medical oncology clinic on 11/03/2017.  At that time, patient continued to improve.  She denied acute complaints.  Patient was taking psyllium fiber for her bowels, which was effective in promoting regular bowel movements.  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  She noted some minor fatigue.  Exam was stable.  WBC 3700 with an [place] 2700.  Platelets 144,000.  Renal and hepatic function normal.  Magnesium stable at 1.8.  She received week #5 Taxol.  CBC on 11/09/2017 revealed a WBC of 3000 with an [place] of 1900.  Hemoglobin 11.6, hematocrit 34.0, MCV 97.3, and platelets 174,000.  Patient received GCSF 480 mcg injection.  In the interim, patient notes that she has had more nausea with this cycle.  She has experienced nausea for the last week, however has not used a prescribed ondansetron.  She is having more difficulty sleeping.  She is using the prescribed light Lorazepam at night to help with her sleep and nausea.  Patient received GCSF injection yesterday, and notes that it "wiped her out".  Patient experienced a nonspecific headache this morning that fully resolved with 500 mg of APAP.  She continues to experience intermittent neuropathy in her LEFT hand and foot.   Patient denies that she has experienced any B symptoms. She denies any interval infections. Patient advises that she maintains an adequate appetite. She is eating "fair" citing the fact that things do not "taste right". Weight today is 141 lb 3.2 oz (64 kg), which compared to her last visit to the clinic, represents a 1 pound weight loss.  Patient denies pain in the clinic today.   Past Medical History:  Diagnosis Date  . Cancer ([place])   .  Diabetes mellitus without complication ([place])   . Diabetic nephropathy ([place])   . Headache    migraines  . Heart murmur    ASYMPTOMATIC  . Hypertension   . Hypothyroidism   . Personal history of chemotherapy 2019   right breast cancer  . Seborrheic keratosis   . Vitamin D deficiency     Past Surgical History:  Procedure Laterality Date  . APPENDECTOMY    . BREAST BIOPSY Right 05/28/2017   right breast bx 3 areas invasive mamm ca lymph node mets  . BREAST CYST ASPIRATION Bilateral    neg  . CHOLECYSTECTOMY    . COLONOSCOPY WITH PROPOFOL N/A 01/01/2015   Procedure: COLONOSCOPY WITH PROPOFOL;  Surgeon: Josefine Class, MD;  Location: [facility] ENDOSCOPY;  Service: Endoscopy;  Laterality: N/A;  . EYE SURGERY     eyelid  . PORTACATH PLACEMENT Right 06/10/2017   Procedure: INSERTION PORT-A-CATH;  Surgeon: Herbert Pun, MD;  Location: [facility] ORS;  Service: General;  Laterality: Right;  . STENT PLACEMENT ILIAC ([place] HX)    . TUBAL LIGATION      Family History  Problem Relation Age of Onset  . Cancer Mother   . Cancer Maternal Aunt   . Breast cancer Neg Hx     Social History:  reports that she quit smoking about 45 years ago. Her smoking use included cigarettes. She has a 20.00 pack-year smoking history. She has never used smokeless tobacco. She reports that she does not  drink alcohol or use drugs.  She has 3 children (daughters: age 79 and 81; son Jaquelyn Bitter): age 3).  She is retired.  She worked for the [employer].  Her husband has dementia.  She lives in [place].  She is alone today.  Allergies:  Allergies  Allergen Reactions  . Fiorinal [Butalbital-Aspirin-Caffeine] Nausea And Vomiting and Other (See Comments)    SEVERE HEADACHE  . Sulfa Antibiotics Other (See Comments)    Burning esophagus and stomach  . Tramadol Nausea And Vomiting  . Other Rash    STERI-STRIPS-RASH STERI-STRIPS-RASH    Current Medications: Current Outpatient Medications  Medication Sig Dispense  Refill  . acetaminophen (TYLENOL) 500 MG tablet Take 1,000 mg by mouth every 6 (six) hours as needed (for headaches.).    Marland Kitchen aspirin-acetaminophen-caffeine (EXCEDRIN MIGRAINE) 250-250-65 MG tablet Take 2 tablets by mouth every 6 (six) hours as needed for headache.    Marland Kitchen atorvastatin (LIPITOR) 10 MG tablet Take 10 mg by mouth every evening.     . Cholecalciferol 2000 UNITS CAPS Take 4,000 Units by mouth daily.     . colesevelam (WELCHOL) 625 MG tablet Take 1,875 mg by mouth every evening.     Marland Kitchen dexamethasone (DECADRON) 4 MG tablet Take 2 tablets by mouth once a day on the day after chemotherapy and then take 2 tablets two times a day for 2 days. Take with food. 30 tablet 1  . levothyroxine (SYNTHROID, LEVOTHROID) 50 MCG tablet Take 50 mcg by mouth daily before breakfast.    . lidocaine-prilocaine (EMLA) cream Apply 1 application topically as needed. 30 g 6  . loratadine (CLARITIN) 10 MG tablet Take 10 mg by mouth daily.    Marland Kitchen LORazepam (ATIVAN) 0.5 MG tablet Take 1 tablet (0.5 mg total) by mouth every 6 (six) hours as needed (Nausea or vomiting). 30 tablet 0  . MAGNESIUM GLYCINATE PLUS PO Take 100 mg by mouth.    . Melatonin 5 MG TABS Take 5 mg by mouth at bedtime as needed (for sleep.).    Marland Kitchen metFORMIN (GLUCOPHAGE) 1000 MG tablet Take 1,000 mg by mouth 2 (two) times daily.    Marland Kitchen nystatin (MYCOSTATIN) 100000 UNIT/ML suspension Take 5 mLs (500,000 Units total) by mouth 4 (four) times daily. 240 mL 0  . ondansetron (ZOFRAN) 8 MG tablet Take 1 tablet (8 mg total) by mouth 2 (two) times daily as needed. Start on the third day after chemotherapy. 30 tablet 1  . polyethylene glycol (MIRALAX / GLYCOLAX) packet Take 17 g by mouth daily.    . quinapril (ACCUPRIL) 5 MG tablet Take 5 mg by mouth every morning.     . sodium bicarbonate 650 MG tablet Take 1,300 mg by mouth at bedtime.     No current facility-administered medications for this visit.    Facility-Administered Medications Ordered in Other Visits   Medication Dose Route Frequency Provider Last Rate Last Dose  . sodium chloride flush (NS) 0.9 % injection 10 mL  10 mL Intravenous PRN Lequita Asal, MD   10 mL at 10/21/17 0841    Review of Systems  Constitutional: Positive for malaise/fatigue and weight loss (down 1 pound). Negative for diaphoresis and fever.       "I am doing all right I guess.  I had more nausea this time, and that shot wiped me out".  HENT: Negative.   Eyes: Negative.   Respiratory: Negative for cough, hemoptysis, sputum production and shortness of breath.   Cardiovascular: Negative for chest pain, palpitations,  orthopnea, leg swelling and PND.  Gastrointestinal: Positive for constipation (Varying bowel habits.  Improved with the use of psyllium fiber.) and heartburn. Negative for abdominal pain, blood in stool, diarrhea, melena, nausea and vomiting.       Dysgeusia  Genitourinary: Negative for dysuria, frequency, hematuria and urgency.  Musculoskeletal: Negative for back pain, falls, joint pain and myalgias.       Generalized bone pain associated with GCSF injections.  Skin: Negative for itching and rash.  Neurological: Positive for sensory change (neuropathy (stable) to LEFT hand and foot -  intermittent and does not affect function) and headaches (mild nonspecific -resolved with APAP). Negative for dizziness, tremors and weakness.  Endo/Heme/Allergies: Does not bruise/bleed easily.       Diabetes -on metformin  Psychiatric/Behavioral: Negative for depression, memory loss and suicidal ideas. The patient has insomnia. The patient is not nervous/anxious.   All other systems reviewed and are negative.  Performance status (ECOG): 1 - Symptomatic but completely ambulatory  Vital signs BP 121/76 (BP Location: Right Arm, Patient Position: Sitting)   Pulse 89   Temp 98.6 F (37 C) (Tympanic)   Resp 12   Ht '5\' 8"'$  (1.727 m)   Wt 141 lb 3.2 oz (64 kg)   BMI 21.47 kg/m   Physical Exam  Constitutional: She is  oriented to person, place, and time and well-developed, well-nourished, and in no distress.  HENT:  Head: Normocephalic and atraumatic.  Eyes: Pupils are equal, round, and reactive to light. EOM are normal. No scleral icterus.  Blue eyes  Neck: Normal range of motion. Neck supple. No tracheal deviation present. No thyromegaly present.  Cardiovascular: Normal rate, regular rhythm and normal heart sounds. Exam reveals no gallop and no friction rub.  No murmur heard. Pulmonary/Chest: Effort normal and breath sounds normal. No respiratory distress. She has no wheezes. She has no rales.  Abdominal: Soft. Bowel sounds are normal. She exhibits no distension. There is no tenderness.  Musculoskeletal: Normal range of motion. She exhibits no edema or tenderness.  Lymphadenopathy:    She has no cervical adenopathy.    She has no axillary adenopathy.       Right: No inguinal and no supraclavicular adenopathy present.       Left: No inguinal and no supraclavicular adenopathy present.  Neurological: She is alert and oriented to person, place, and time.  Skin: Skin is warm and dry. No rash noted. No erythema.  Psychiatric: Mood, affect and judgment normal.  Nursing note and vitals reviewed.   Infusion on 11/10/2017  Component Date Value Ref Range Status  . Magnesium 11/10/2017 2.0  1.7 - 2.4 mg/dL Final   Performed at [facility], [address], [place], [place] [address]  . Sodium 11/10/2017 136  135 - 145 mmol/L Final  . Potassium 11/10/2017 3.6  3.5 - 5.1 mmol/L Final  . Chloride 11/10/2017 104  98 - 111 mmol/L Final  . CO2 11/10/2017 23  22 - 32 mmol/L Final  . Glucose, Bld 11/10/2017 158* 70 - 99 mg/dL Final  . BUN 11/10/2017 8  8 - 23 mg/dL Final  . Creatinine, Ser 11/10/2017 0.72  0.44 - 1.00 mg/dL Final  . Calcium 11/10/2017 9.3  8.9 - 10.3 mg/dL Final  . Total Protein 11/10/2017 6.4* 6.5 - 8.1 g/dL Final  . Albumin 11/10/2017 4.1  3.5 - 5.0 g/dL Final  . AST 11/10/2017 28  15 -  41 U/L Final  . ALT 11/10/2017 27  0 - 44  U/L Final  . Alkaline Phosphatase 11/10/2017 53  38 - 126 U/L Final  . Total Bilirubin 11/10/2017 0.9  0.3 - 1.2 mg/dL Final  . GFR calc non Af Amer 11/10/2017 >60  >60 mL/min Final  . GFR calc Af Amer 11/10/2017 >60  >60 mL/min Final   Comment: (NOTE) The eGFR has been calculated using the CKD EPI equation. This calculation has not been validated in all clinical situations. eGFR's persistently <60 mL/min signify possible Chronic Kidney Disease.   Georgiann Hahn gap 11/10/2017 9  5 - 15 Final   Performed at [facility], [address] [place], [place] [address]  . WBC 11/10/2017 9.0  3.6 - 11.0 K/uL Final  . RBC 11/10/2017 3.22* 3.80 - 5.20 MIL/uL Final  . Hemoglobin 11/10/2017 10.7* 12.0 - 16.0 g/dL Final  . HCT 11/10/2017 31.4* 35.0 - 47.0 % Final  . MCV 11/10/2017 97.5  80.0 - 100.0 fL Final  . MCH 11/10/2017 33.3  26.0 - 34.0 pg Final  . MCHC 11/10/2017 34.2  32.0 - 36.0 g/dL Final  . RDW 11/10/2017 17.1* 11.5 - 14.5 % Final  . Platelets 11/10/2017 147* 150 - 440 K/uL Final  . Neutrophils Relative % 11/10/2017 86  % Final  . Neutro Abs 11/10/2017 7.8* 1.4 - 6.5 K/uL Final  . Lymphocytes Relative 11/10/2017 5  % Final  . Lymphs Abs 11/10/2017 0.5* 1.0 - 3.6 K/uL Final  . Monocytes Relative 11/10/2017 7  % Final  . Monocytes Absolute 11/10/2017 0.6  0.2 - 0.9 K/uL Final  . Eosinophils Relative 11/10/2017 1  % Final  . Eosinophils Absolute 11/10/2017 0.1  0 - 0.7 K/uL Final  . Basophils Relative 11/10/2017 1  % Final  . Basophils Absolute 11/10/2017 0.1  0 - 0.1 K/uL Final   Performed at [facility], [address] [place], [place] [address]  Appointment on 11/09/2017  Component Date Value Ref Range Status  . WBC 11/09/2017 3.0* 3.6 - 11.0 K/uL Final  . RBC 11/09/2017 3.49* 3.80 - 5.20 MIL/uL Final  . Hemoglobin 11/09/2017 11.6* 12.0 - 16.0 g/dL Final  . HCT 11/09/2017 34.0* 35.0 - 47.0 % Final  . MCV 11/09/2017 97.3  80.0  - 100.0 fL Final  . MCH 11/09/2017 33.1  26.0 - 34.0 pg Final  . MCHC 11/09/2017 34.0  32.0 - 36.0 g/dL Final  . RDW 11/09/2017 16.7* 11.5 - 14.5 % Final  . Platelets 11/09/2017 174  150 - 440 K/uL Final  . Neutrophils Relative % 11/09/2017 64  % Final  . Neutro Abs 11/09/2017 1.9  1.4 - 6.5 K/uL Final  . Lymphocytes Relative 11/09/2017 21  % Final  . Lymphs Abs 11/09/2017 0.6* 1.0 - 3.6 K/uL Final  . Monocytes Relative 11/09/2017 11  % Final  . Monocytes Absolute 11/09/2017 0.3  0.2 - 0.9 K/uL Final  . Eosinophils Relative 11/09/2017 3  % Final  . Eosinophils Absolute 11/09/2017 0.1  0 - 0.7 K/uL Final  . Basophils Relative 11/09/2017 1  % Final  . Basophils Absolute 11/09/2017 0.0  0 - 0.1 K/uL Final   Performed at [facility], [address] [place], [place] [address]    Assessment:  Stephanie Sanders is a 72 y.o. 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  Pathology revealed two grade II invasive mammary carcinomas of no special type (4 cm from the nipple and 5 cm from the nipple).  Tumors  were in close proximity.  Lymph node biopsy confirmed metastatic carcinoma of breast origin.  Tumor is ER negative (< 1%), PR negative and Her 2/neu 2+ by IHC.  [facility] genetic testing on 07/21/2017 revealed a variant of uncertain significance in RECQL4.  Right sided mammogram and ultrasound on 05/21/2017 revealed 2 highly suspicious masses in the right breast at the 10:30 position 4 cm from nipple (1.8 x 1.2 x 1.1 cm) and at the 10:30 position 5 cm from nipple (1.1 x 0.9 x 0.9 cm).  There was a suspicious 2.7 x 1 x 2.2 cm lymph node in the right axilla with asymmetric nodular cortical thickening.  Ultrasound guided biopsy of the 2 masses and lymph node on 05/28/2017 revealed grade II invasive mammary carcinoma of no special type (4 cm from the nipple and 5 cm from the nipple).  Lymph node biopsy revealed metastatic carcinoma of  breast origin.  Tumor is ER negative (< 1%), PR negative and Her 2/neu 2+ by IHC.  FISH was negative.  CA 27.29 was 16.5 and CA15-3 was 17.6 on 06/04/2017.  Echo on 06/09/2017 revealed an EF of 60-65%.  She has enrolled on the UPBEAT clinical trial.  She received 4 cycles of AC (06/23/2017 - 09/08/2017) with Udencya support.  Cycle #1 was complicated by influenza A.  Nadir counts included an ANC of 0 and platelet count of 71,000. Cycle #2 postponed due to slow recovery from influenza and excisional cyst removal with surrounding cellulitis (treated with 10 days of doxycycline).   She is s/p week #5 Taxol (09/29/2017 - 11/03/2017).  She received GCSF after week #3 and 5 Taxol secondary to borderline counts. Cycle #5 was postponed on 10/29/2017 due to patient's wishes to be treated on Tuesdays only.   Right mammogram and ultrasound on 09/28/2017 revealed the 2 adjacent masses in the right breast at the 10:30 o'clock position had both decreased in size (1.8 x 1.1 x 1.2 cm to 1.2 x 0.7 x 1.1 cm; 11 x 9 x 9 mm to 7 x 6 x 6 mm) as well as the metastatic right axillary lymph node (2 .7 cm to 1.6 x 0.9 x 1.7 cm).    She was admitted to [facility] from 06/28/2017 - 07/01/2017 with with fever, cough and generalized weakness.  Nasal swab was + for influenza A.  She completed a course of Tamiflu.  Symptomatically, patient is fatigued.  She notes persistent nausea following this last treatment that lasted for about a week.  Of note, patient did not use prescribed antiemetics.  She is experiencing dysgeusia.  Bowels have improved with the use of psyllium fiber.  Neuropathy is stable.  Exam is grossly unremarkable.  WBC 9000 with an [place] of 7800.  Hemoglobin 10.7.  Platelets 147,000. BUN 8 creatinine 0.72 (CrCl 64.1 mL/min).  LFTs normal.   Plan: 1. Labs today:  CBC with diff, CMP, Mg 2. Labs reviewed. Blood counts stable and adequate enough for treatment. Will proceed with cycle #6 Taxol 3. Discuss symptom management.   Patient has antiemetics and pain medications at home to use on a PRN basis.  Encourage patient to utilize the prescribed antiemetics to help with her nausea. Patient  advising that the prescribed interventions are adequate at this point. Continue all medications as previously prescribed.  Discuss protein calorie malnutrition. Patient's weight is noted to be fluctuating. Her weight today is 141 lb 3.2 oz (64 kg). Her BMI of 21.47 kg/m places her in the normal weight category. Encouraged her  to increase her intake of calorie and protein dense food choices. Additionally, patient was encouraged to utilize nutritional shakes to supplement her intake. Discuss neuropathy. Patient notes that her symptoms are stable and not limiting her function at this time. Will continue to monitor.  RTC on 11/16/2017 for labs (CBC with diff) and +/- GCSF RTC on 11/17/2017 for MD assessment, labs (CBC with diff, CMP, Mg all) and +/- additional carboplatin + Taxol.   Honor Loh, NP 11/10/2017,9:39 AM

## 2017-11-10 NOTE — Progress Notes (Signed)
Order in supportive therapy plan for Granix 480 mcg injection to be given on 7/23. Spoke with Honor Loh, NP, for clarification. Per NP order: do not give Granix injection today.

## 2017-11-11 DIAGNOSIS — R110 Nausea: Secondary | ICD-10-CM | POA: Insufficient documentation

## 2017-11-11 DIAGNOSIS — T451X5A Adverse effect of antineoplastic and immunosuppressive drugs, initial encounter: Secondary | ICD-10-CM

## 2017-11-16 ENCOUNTER — Other Ambulatory Visit: Payer: Federal, State, Local not specified - PPO

## 2017-11-16 ENCOUNTER — Ambulatory Visit: Payer: Federal, State, Local not specified - PPO

## 2017-11-16 NOTE — Progress Notes (Signed)
[facility] day:  11/17/2017   Chief Complaint: Stephanie Sanders is a 72 y.o. female with multi-focal right breast cancer who is seen for assessment prior to week #7 Taxol.   HPI:  The patient was last seen in the medical oncology clinic on 11/10/2017.  At that time, patient noted more nausea with her last cycle. She was not using the prescribed antiemetics. She was having more difficulty sleeping, for which she was using the prescribed lorazepam. She received GCSF on 11/09/2017, which "wiped her out".  She complained of a non-specific headache that fully resolved with APAP 500 mg. 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 Exam was stable. WBC 9000 ([place] 7800). Platelets 147,000. She received week #6 Taxol.   In the interim, patient is "really tired".  She has had continued nausea, however notes that it has been well controlled with the prescribed antiemetics. She denies any fevers, sweats, or weight loss. She has not encountered any recent infections.   Patient advises that she maintains an adequate appetite. She is eating well. Weight today is 139 lb 7 oz (63.2 kg), which compared to her last visit to the clinic, represents a 2 pound decrease.    Patient denies pain in the clinic today.    Past Medical History:  Diagnosis Date  . Cancer ([place])   . Diabetes mellitus without complication ([place])   . Diabetic nephropathy ([place])   . Headache    migraines  . Heart murmur    ASYMPTOMATIC  . Hypertension   . Hypothyroidism   . Personal history of chemotherapy 2019   right breast cancer  . Seborrheic keratosis   . Vitamin D deficiency     Past Surgical History:  Procedure Laterality Date  . APPENDECTOMY    . BREAST BIOPSY Right 05/28/2017   right breast bx 3 areas invasive mamm ca lymph node mets  . BREAST CYST ASPIRATION Bilateral    neg  . CHOLECYSTECTOMY    . COLONOSCOPY WITH PROPOFOL N/A 01/01/2015    Procedure: COLONOSCOPY WITH PROPOFOL;  Surgeon: Josefine Class, MD;  Location: [facility] ENDOSCOPY;  Service: Endoscopy;  Laterality: N/A;  . EYE SURGERY     eyelid  . PORTACATH PLACEMENT Right 06/10/2017   Procedure: INSERTION PORT-A-CATH;  Surgeon: Herbert Pun, MD;  Location: [facility] ORS;  Service: General;  Laterality: Right;  . STENT PLACEMENT ILIAC ([place] HX)    . TUBAL LIGATION      Family History  Problem Relation Age of Onset  . Cancer Mother   . Cancer Maternal Aunt   . Breast cancer Neg Hx     Social History:  reports that she quit smoking about 45 years ago. Her smoking use included cigarettes. She has a 20.00 pack-year smoking history. She has never used smokeless tobacco. She reports that she does not drink alcohol or use drugs.  She has 3 children (daughters: age 17 and 6; son Jaquelyn Bitter): age 64).  She is retired.  She worked for the [employer].  Her husband has dementia.  She lives in [place].  She is alone today.  Allergies:  Allergies  Allergen Reactions  . Fiorinal [Butalbital-Aspirin-Caffeine] Nausea And Vomiting and Other (See Comments)    SEVERE HEADACHE  . Sulfa Antibiotics Other (See Comments)    Burning esophagus and stomach  . Tramadol Nausea And Vomiting  . Other Rash    STERI-STRIPS-RASH STERI-STRIPS-RASH    Current Medications: Current Outpatient  Medications  Medication Sig Dispense Refill  . acetaminophen (TYLENOL) 500 MG tablet Take 1,000 mg by mouth every 6 (six) hours as needed (for headaches.).    Marland Kitchen atorvastatin (LIPITOR) 10 MG tablet Take 10 mg by mouth every evening.     . Cholecalciferol 2000 UNITS CAPS Take 4,000 Units by mouth daily.     . colesevelam (WELCHOL) 625 MG tablet Take 1,875 mg by mouth every evening.     Marland Kitchen dexamethasone (DECADRON) 4 MG tablet Take 2 tablets by mouth once a day on the day after chemotherapy and then take 2 tablets two times a day for 2 days. Take with food. 30 tablet 1  . levothyroxine (SYNTHROID, LEVOTHROID) 50  MCG tablet Take 50 mcg by mouth daily before breakfast.    . lidocaine-prilocaine (EMLA) cream Apply 1 application topically as needed. 30 g 6  . loratadine (CLARITIN) 10 MG tablet Take 10 mg by mouth daily.    Marland Kitchen LORazepam (ATIVAN) 0.5 MG tablet Take 1 tablet (0.5 mg total) by mouth every 6 (six) hours as needed (Nausea or vomiting). 30 tablet 0  . MAGNESIUM GLYCINATE PLUS PO Take 100 mg by mouth.    . Melatonin 5 MG TABS Take 5 mg by mouth at bedtime as needed (for sleep.).    Marland Kitchen metFORMIN (GLUCOPHAGE) 1000 MG tablet Take 1,000 mg by mouth 2 (two) times daily.    Marland Kitchen nystatin (MYCOSTATIN) 100000 UNIT/ML suspension Take 5 mLs (500,000 Units total) by mouth 4 (four) times daily. 240 mL 0  . ondansetron (ZOFRAN) 8 MG tablet Take 1 tablet (8 mg total) by mouth 2 (two) times daily as needed. Start on the third day after chemotherapy. 30 tablet 1  . polyethylene glycol (MIRALAX / GLYCOLAX) packet Take 17 g by mouth daily.    . quinapril (ACCUPRIL) 5 MG tablet Take 5 mg by mouth every morning.     . sodium bicarbonate 650 MG tablet Take 1,300 mg by mouth at bedtime.    Marland Kitchen aspirin-acetaminophen-caffeine (EXCEDRIN MIGRAINE) 250-250-65 MG tablet Take 2 tablets by mouth every 6 (six) hours as needed for headache.     No current facility-administered medications for this visit.    Facility-Administered Medications Ordered in Other Visits  Medication Dose Route Frequency Provider Last Rate Last Dose  . sodium chloride flush (NS) 0.9 % injection 10 mL  10 mL Intravenous PRN Lequita Asal, MD   10 mL at 10/21/17 0841    Review of Systems  Constitutional: Positive for malaise/fatigue and weight loss (2 pounds). Negative for diaphoresis and fever.       Tired.  Overall feels great.  HENT: Negative for congestion, ear discharge, ear pain, hearing loss, nosebleeds, sinus pain and sore throat.        Dysguesia.  Eyes: Negative.  Negative for blurred vision, double vision, photophobia, pain and discharge.   Respiratory: Negative for cough, hemoptysis, sputum production, shortness of breath and stridor.   Cardiovascular: Negative for chest pain, palpitations, orthopnea, leg swelling and PND.  Gastrointestinal: Positive for constipation (improved with the use of psyllium fiber) and nausea. Negative for abdominal pain, blood in stool, diarrhea, melena and vomiting.  Genitourinary: Negative for dysuria, frequency, hematuria and urgency.  Musculoskeletal: Negative for back pain, falls, joint pain and myalgias.  Skin: Negative for itching and rash.  Neurological: Positive for sensory change (stable neuropathy in LEFT hand and foot - intermittent and does not affect function). Negative for dizziness, tremors, speech change, weakness and headaches.  Endo/Heme/Allergies: Does not bruise/bleed easily.       T2DM on metformin  Psychiatric/Behavioral: Negative for depression, memory loss and suicidal ideas. The patient is not nervous/anxious and does not have insomnia.   All other systems reviewed and are negative.  Performance status (ECOG): 1 - Symptomatic but completely ambulatory  Vital signs BP 120/84 (BP Location: Left Arm, Patient Position: Sitting)   Pulse 76   Temp (!) 96.6 F (35.9 C) (Tympanic)   Resp 18   Wt 139 lb 7 oz (63.2 kg)   BMI 21.20 kg/m   Physical Exam  Constitutional: She is oriented to person, place, and time and well-developed, well-nourished, and in no distress.  HENT:  Head: Normocephalic and atraumatic.  Eyes: Pupils are equal, round, and reactive to light. EOM are normal. No scleral icterus.  Blue eyes  Neck: Normal range of motion. Neck supple. No tracheal deviation present. No thyromegaly present.  Cardiovascular: Normal rate, regular rhythm and normal heart sounds. Exam reveals no gallop and no friction rub.  No murmur heard. Pulmonary/Chest: Effort normal and breath sounds normal. No respiratory distress. She has no wheezes. She has no rales.  Abdominal: Soft.  Bowel sounds are normal. She exhibits no distension. There is no tenderness.  Musculoskeletal: Normal range of motion. She exhibits no edema or tenderness.  Lymphadenopathy:    She has no cervical adenopathy.    She has no axillary adenopathy.       Right: No inguinal and no supraclavicular adenopathy present.       Left: No inguinal and no supraclavicular adenopathy present.  Neurological: She is alert and oriented to person, place, and time.  Skin: Skin is warm and dry. No rash noted. No erythema.  Psychiatric: Mood, affect and judgment normal.  Nursing note and vitals reviewed.   Infusion on 11/17/2017  Component Date Value Ref Range Status  . Magnesium 11/17/2017 2.1  1.7 - 2.4 mg/dL Final   Performed at [facility], [address], [place], [place] [address]  . Sodium 11/17/2017 136  135 - 145 mmol/L Final  . Potassium 11/17/2017 3.8  3.5 - 5.1 mmol/L Final  . Chloride 11/17/2017 104  98 - 111 mmol/L Final  . CO2 11/17/2017 23  22 - 32 mmol/L Final  . Glucose, Bld 11/17/2017 134* 70 - 99 mg/dL Final  . BUN 11/17/2017 8  8 - 23 mg/dL Final  . Creatinine, Ser 11/17/2017 0.64  0.44 - 1.00 mg/dL Final  . Calcium 11/17/2017 9.0  8.9 - 10.3 mg/dL Final  . Total Protein 11/17/2017 6.6  6.5 - 8.1 g/dL Final  . Albumin 11/17/2017 3.9  3.5 - 5.0 g/dL Final  . AST 11/17/2017 21  15 - 41 U/L Final  . ALT 11/17/2017 21  0 - 44 U/L Final  . Alkaline Phosphatase 11/17/2017 53  38 - 126 U/L Final  . Total Bilirubin 11/17/2017 0.8  0.3 - 1.2 mg/dL Final  . GFR calc non Af Amer 11/17/2017 >60  >60 mL/min Final  . GFR calc Af Amer 11/17/2017 >60  >60 mL/min Final   Comment: (NOTE) The eGFR has been calculated using the CKD EPI equation. This calculation has not been validated in all clinical situations. eGFR's persistently <60 mL/min signify possible Chronic Kidney Disease.   Georgiann Hahn gap 11/17/2017 9  5 - 15 Final   Performed at [facility], [address] [place],  [place] [address]  . WBC 11/17/2017 0.6* 3.6 - 11.0 K/uL Final  Comment: RESULT REPEATED AND VERIFIED CANCER CENTER CRITICAL VALUE PROTOCOL   . RBC 11/17/2017 3.07* 3.80 - 5.20 MIL/uL Final  . Hemoglobin 11/17/2017 10.0* 12.0 - 16.0 g/dL Final  . HCT 11/17/2017 29.4* 35.0 - 47.0 % Final  . MCV 11/17/2017 95.6  80.0 - 100.0 fL Final  . MCH 11/17/2017 32.7  26.0 - 34.0 pg Final  . MCHC 11/17/2017 34.2  32.0 - 36.0 g/dL Final  . RDW 11/17/2017 16.5* 11.5 - 14.5 % Final  . Platelets 11/17/2017 148* 150 - 440 K/uL Final  . Neutrophils Relative % 11/17/2017 35  % Final  . Neutro Abs 11/17/2017 0.2* 1.4 - 6.5 K/uL Final   Comment: RESULT REPEATED AND VERIFIED CRITICAL RESULT CALLED TO, READ BACK BY AND VERIFIED WITH: Harvey Lingo '@8'$ :50AM 11/17/2017 BY L[facility]   . Lymphocytes Relative 11/17/2017 43  % Final  . Lymphs Abs 11/17/2017 0.3* 1.0 - 3.6 K/uL Final  . Monocytes Relative 11/17/2017 9  % Final  . Monocytes Absolute 11/17/2017 0.1* 0.2 - 0.9 K/uL Final  . Eosinophils Relative 11/17/2017 10  % Final  . Eosinophils Absolute 11/17/2017 0.1  0 - 0.7 K/uL Final  . Basophils Relative 11/17/2017 3  % Final  . Basophils Absolute 11/17/2017 0.0  0 - 0.1 K/uL Final  . WBC Morphology 11/17/2017 TOO FEW TO COUNT, SMEAR AVAILABLE FOR REVIEW   Final   Performed at [facility], [address], [place], [place] [address]    Assessment:  Stephanie Sanders is a 72 y.o. 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  Pathology revealed two grade II invasive mammary carcinomas of no special type (4 cm from the nipple and 5 cm from the nipple).  Tumors were in close proximity.  Lymph node biopsy confirmed metastatic carcinoma of breast origin.  Tumor is ER negative (< 1%), PR negative and Her 2/neu 2+ by IHC.  [facility] genetic testing on 07/21/2017 revealed a variant of uncertain significance in RECQL4.  Right sided mammogram and ultrasound on 05/21/2017  revealed 2 highly suspicious masses in the right breast at the 10:30 position 4 cm from nipple (1.8 x 1.2 x 1.1 cm) and at the 10:30 position 5 cm from nipple (1.1 x 0.9 x 0.9 cm).  There was a suspicious 2.7 x 1 x 2.2 cm lymph node in the right axilla with asymmetric nodular cortical thickening.  Ultrasound guided biopsy of the 2 masses and lymph node on 05/28/2017 revealed grade II invasive mammary carcinoma of no special type (4 cm from the nipple and 5 cm from the nipple).  Lymph node biopsy revealed metastatic carcinoma of breast origin.  Tumor is ER negative (< 1%), PR negative and Her 2/neu 2+ by IHC.  FISH was negative.  CA 27.29 was 16.5 and CA15-3 was 17.6 on 06/04/2017.  Echo on 06/09/2017 revealed an EF of 60-65%.  She has enrolled on the UPBEAT clinical trial.  She received 4 cycles of AC (06/23/2017 - 09/08/2017) with Udencya support.  Cycle #1 was complicated by influenza A.  Nadir counts included an ANC of 0 and platelet count of 71,000. Cycle #2 postponed due to slow recovery from influenza and excisional cyst removal with surrounding cellulitis (treated with 10 days of doxycycline).   She is s/p week #6 Taxol (09/29/2017 - 11/10/2017).  She received GCSF after week #3 and 5 Taxol secondary to borderline counts. Cycle #5 was postponed on 10/29/2017 due to patient's wishes to  be treated on Tuesdays only.   Right mammogram and ultrasound on 09/28/2017 revealed the 2 adjacent masses in the right breast at the 10:30 o'clock position had both decreased in size (1.8 x 1.1 x 1.2 cm to 1.2 x 0.7 x 1.1 cm; 11 x 9 x 9 mm to 7 x 6 x 6 mm) as well as the metastatic right axillary lymph node (2 .7 cm to 1.6 x 0.9 x 1.7 cm).    She [facility] from [facility]/28/2017 - 07/01/2017 with with fever, cough and generalized weakness.  Nasal swab was + for influenza A.  She completed a course of Tamiflu.  Symptomatically, she is feeling "great".  She has continued nausea and is now using the prescribed  ondansetron.  She is experiencing dysgeusia.  Bowels have improved with the use of psyllium fiber.  Neuropathy is stable.  Exam is grossly unremarkable.  WBC 600 with an ANC of 200.  Platelets 148,000.   Plan: 1. Labs today:  CBC with diff, CMP, Mg 2. Labs reviewed. ANC low at 200. Will hold week #7 Taxol. Discussed neutropenic precautions, specifically to avoid sick contacts and maintain strict handwashing.  3. Chemotherapy induced neutropenia. Counts have significantly dropped. Will need GCSF today and tomorrow. Will check labs on Thursday with +/- GCSF.  4. Chemotherapy induced neuropathy:  Symptom is stable and not limiting day to day function at this time. Noted to be intermittent and unilateral. Will continue to monitor.  5. Protein calorie malnutrition:  She has lost 2 pounds.  Her weight today is 139 lb 7 oz (63.2 kg). Her BMI of 21.20 kg/m places her in the normal weight category. Encouraged her to increase her intake of calorie and protein dense food choices. Additionally, patient was encouraged to utilize nutritional shakes to supplement her intake. 6. RTC in 1 week for MD, labs (CBC with diff, CMP, Mg) and +/- week #7 Taxol.    Honor Loh, NP 11/17/2017,6:15 PM   I saw and evaluated the patient, participating in the key portions of the service and reviewing pertinent diagnostic studies and records.  I reviewed the nurse practitioner's note and agree with the findings and the plan.  The assessment and plan were discussed with the patient.  Multiple questions were asked by the patient and answered.   Nolon Stalls, MD 11/17/2017,6:15 PM

## 2017-11-17 ENCOUNTER — Encounter: Payer: Self-pay | Admitting: Hematology and Oncology

## 2017-11-17 ENCOUNTER — Telehealth: Payer: Self-pay | Admitting: *Deleted

## 2017-11-17 ENCOUNTER — Inpatient Hospital Stay: Payer: Medicare Other

## 2017-11-17 ENCOUNTER — Inpatient Hospital Stay (HOSPITAL_BASED_OUTPATIENT_CLINIC_OR_DEPARTMENT_OTHER): Payer: Medicare Other | Admitting: Hematology and Oncology

## 2017-11-17 ENCOUNTER — Other Ambulatory Visit: Payer: Self-pay | Admitting: Hematology and Oncology

## 2017-11-17 VITALS — BP 120/84 | HR 76 | Temp 96.6°F | Resp 18 | Wt 139.4 lb

## 2017-11-17 DIAGNOSIS — Z171 Estrogen receptor negative status [ER-]: Principal | ICD-10-CM

## 2017-11-17 DIAGNOSIS — C50411 Malignant neoplasm of upper-outer quadrant of right female breast: Secondary | ICD-10-CM | POA: Diagnosis not present

## 2017-11-17 DIAGNOSIS — E119 Type 2 diabetes mellitus without complications: Secondary | ICD-10-CM

## 2017-11-17 DIAGNOSIS — D701 Agranulocytosis secondary to cancer chemotherapy: Secondary | ICD-10-CM | POA: Diagnosis not present

## 2017-11-17 DIAGNOSIS — E46 Unspecified protein-calorie malnutrition: Secondary | ICD-10-CM

## 2017-11-17 DIAGNOSIS — I10 Essential (primary) hypertension: Secondary | ICD-10-CM

## 2017-11-17 DIAGNOSIS — C773 Secondary and unspecified malignant neoplasm of axilla and upper limb lymph nodes: Secondary | ICD-10-CM

## 2017-11-17 DIAGNOSIS — Z87891 Personal history of nicotine dependence: Secondary | ICD-10-CM

## 2017-11-17 DIAGNOSIS — C50911 Malignant neoplasm of unspecified site of right female breast: Secondary | ICD-10-CM

## 2017-11-17 DIAGNOSIS — G620 Drug-induced polyneuropathy: Secondary | ICD-10-CM | POA: Diagnosis not present

## 2017-11-17 DIAGNOSIS — R110 Nausea: Secondary | ICD-10-CM

## 2017-11-17 DIAGNOSIS — T451X5A Adverse effect of antineoplastic and immunosuppressive drugs, initial encounter: Secondary | ICD-10-CM

## 2017-11-17 DIAGNOSIS — R634 Abnormal weight loss: Secondary | ICD-10-CM

## 2017-11-17 DIAGNOSIS — Z7189 Other specified counseling: Secondary | ICD-10-CM

## 2017-11-17 DIAGNOSIS — Z5111 Encounter for antineoplastic chemotherapy: Secondary | ICD-10-CM | POA: Diagnosis not present

## 2017-11-17 LAB — COMPREHENSIVE METABOLIC PANEL
ALBUMIN: 3.9 g/dL (ref 3.5–5.0)
ALK PHOS: 53 U/L (ref 38–126)
ALT: 21 U/L (ref 0–44)
ANION GAP: 9 (ref 5–15)
AST: 21 U/L (ref 15–41)
BUN: 8 mg/dL (ref 8–23)
CO2: 23 mmol/L (ref 22–32)
Calcium: 9 mg/dL (ref 8.9–10.3)
Chloride: 104 mmol/L (ref 98–111)
Creatinine, Ser: 0.64 mg/dL (ref 0.44–1.00)
GFR calc Af Amer: >60 mL/min (ref >60)
GFR calc non Af Amer: >60 mL/min (ref >60)
GLUCOSE: 134 mg/dL — AB (ref 70–99)
Potassium: 3.8 mmol/L (ref 3.5–5.1)
SODIUM: 136 mmol/L (ref 135–145)
Total Bilirubin: 0.8 mg/dL (ref 0.3–1.2)
Total Protein: 6.6 g/dL (ref 6.5–8.1)

## 2017-11-17 LAB — CBC WITH DIFFERENTIAL/PLATELET
Basophils Absolute: 0 10*3/uL (ref 0–0.1)
Basophils Relative: 3 %
Eosinophils Absolute: 0.1 10*3/uL (ref 0–0.7)
Eosinophils Relative: 10 %
HEMATOCRIT: 29.4 % — AB (ref 35.0–47.0)
Hemoglobin: 10 g/dL — ABNORMAL LOW (ref 12.0–16.0)
LYMPHS ABS: 0.3 10*3/uL — AB (ref 1.0–3.6)
LYMPHS PCT: 43 %
MCH: 32.7 pg (ref 26.0–34.0)
MCHC: 34.2 g/dL (ref 32.0–36.0)
MCV: 95.6 fL (ref 80.0–100.0)
Monocytes Absolute: 0.1 10*3/uL — ABNORMAL LOW (ref 0.2–0.9)
Monocytes Relative: 9 %
NEUTROS PCT: 35 %
Neutro Abs: 0.2 10*3/uL — ABNORMAL LOW (ref 1.4–6.5)
PLATELETS: 148 10*3/uL — AB (ref 150–440)
RBC: 3.07 MIL/uL — AB (ref 3.80–5.20)
RDW: 16.5 % — ABNORMAL HIGH (ref 11.5–14.5)
WBC: 0.6 10*3/uL — AB (ref 3.6–11.0)

## 2017-11-17 LAB — MAGNESIUM: Magnesium: 2.1 mg/dL (ref 1.7–2.4)

## 2017-11-17 MED ORDER — SODIUM CHLORIDE 0.9% FLUSH
10.0000 mL | INTRAVENOUS | Status: DC | PRN
  Administered 2017-11-17: 10 mL via INTRAVENOUS
  Filled 2017-11-17: qty 10

## 2017-11-17 MED ORDER — HEPARIN SOD (PORK) LOCK FLUSH 100 UNIT/ML IV SOLN
500.0000 [IU] | Freq: Once | INTRAVENOUS | Status: AC
  Administered 2017-11-17: 500 [IU] via INTRAVENOUS

## 2017-11-17 MED ORDER — TBO-FILGRASTIM 480 MCG/0.8ML ~~LOC~~ SOSY
480.0000 ug | PREFILLED_SYRINGE | Freq: Once | SUBCUTANEOUS | Status: AC
  Administered 2017-11-17: 480 ug via SUBCUTANEOUS
  Filled 2017-11-17: qty 0.8

## 2017-11-17 NOTE — Progress Notes (Signed)
Patient states she had some constipation this week which has resolved.  States she feels weak today.  ANC 0.2.

## 2017-11-17 NOTE — Telephone Encounter (Signed)
Critical Lab - ANC 0.2.  MD notified. 

## 2017-11-18 ENCOUNTER — Inpatient Hospital Stay: Payer: Medicare Other

## 2017-11-18 DIAGNOSIS — T451X5A Adverse effect of antineoplastic and immunosuppressive drugs, initial encounter: Principal | ICD-10-CM

## 2017-11-18 DIAGNOSIS — Z5111 Encounter for antineoplastic chemotherapy: Secondary | ICD-10-CM | POA: Diagnosis not present

## 2017-11-18 DIAGNOSIS — R110 Nausea: Secondary | ICD-10-CM

## 2017-11-18 MED ORDER — TBO-FILGRASTIM 300 MCG/0.5ML ~~LOC~~ SOSY
480.0000 ug | PREFILLED_SYRINGE | Freq: Once | SUBCUTANEOUS | Status: AC
  Administered 2017-11-18: 480 ug via SUBCUTANEOUS

## 2017-11-20 ENCOUNTER — Inpatient Hospital Stay: Payer: Medicare Other

## 2017-11-20 ENCOUNTER — Inpatient Hospital Stay: Payer: Medicare Other | Attending: Hematology and Oncology

## 2017-11-20 DIAGNOSIS — C50411 Malignant neoplasm of upper-outer quadrant of right female breast: Secondary | ICD-10-CM | POA: Diagnosis present

## 2017-11-20 DIAGNOSIS — C50911 Malignant neoplasm of unspecified site of right female breast: Secondary | ICD-10-CM

## 2017-11-20 DIAGNOSIS — R197 Diarrhea, unspecified: Secondary | ICD-10-CM | POA: Insufficient documentation

## 2017-11-20 DIAGNOSIS — C773 Secondary and unspecified malignant neoplasm of axilla and upper limb lymph nodes: Secondary | ICD-10-CM | POA: Insufficient documentation

## 2017-11-20 DIAGNOSIS — R140 Abdominal distension (gaseous): Secondary | ICD-10-CM | POA: Diagnosis not present

## 2017-11-20 DIAGNOSIS — Z79899 Other long term (current) drug therapy: Secondary | ICD-10-CM | POA: Diagnosis not present

## 2017-11-20 DIAGNOSIS — Z171 Estrogen receptor negative status [ER-]: Secondary | ICD-10-CM

## 2017-11-20 DIAGNOSIS — R143 Flatulence: Secondary | ICD-10-CM | POA: Diagnosis not present

## 2017-11-20 DIAGNOSIS — D701 Agranulocytosis secondary to cancer chemotherapy: Secondary | ICD-10-CM | POA: Diagnosis not present

## 2017-11-20 DIAGNOSIS — Z5111 Encounter for antineoplastic chemotherapy: Secondary | ICD-10-CM | POA: Diagnosis present

## 2017-11-20 DIAGNOSIS — C50211 Malignant neoplasm of upper-inner quadrant of right female breast: Secondary | ICD-10-CM

## 2017-11-20 LAB — CBC WITH DIFFERENTIAL/PLATELET
Basophils Absolute: 0 10*3/uL (ref 0–0.1)
Basophils Relative: 1 %
Eosinophils Absolute: 0.1 10*3/uL (ref 0–0.7)
Eosinophils Relative: 2 %
HCT: 33.7 % — ABNORMAL LOW (ref 35.0–47.0)
Hemoglobin: 11.1 g/dL — ABNORMAL LOW (ref 12.0–16.0)
Lymphocytes Relative: 19 %
Lymphs Abs: 0.6 10*3/uL — ABNORMAL LOW (ref 1.0–3.6)
MCH: 32.2 pg (ref 26.0–34.0)
MCHC: 33 g/dL (ref 32.0–36.0)
MCV: 97.4 fL (ref 80.0–100.0)
Monocytes Absolute: 0.7 10*3/uL (ref 0.2–0.9)
Monocytes Relative: 22 %
Neutro Abs: 2 10*3/uL (ref 1.4–6.5)
Neutrophils Relative %: 56 %
Platelets: 150 10*3/uL (ref 150–440)
RBC: 3.46 MIL/uL — ABNORMAL LOW (ref 3.80–5.20)
RDW: 16.5 % — ABNORMAL HIGH (ref 11.5–14.5)
WBC: 3.4 10*3/uL — ABNORMAL LOW (ref 4.0–10.5)

## 2017-11-24 ENCOUNTER — Inpatient Hospital Stay: Payer: Medicare Other

## 2017-11-24 ENCOUNTER — Encounter: Payer: Self-pay | Admitting: Hematology and Oncology

## 2017-11-24 ENCOUNTER — Inpatient Hospital Stay (HOSPITAL_BASED_OUTPATIENT_CLINIC_OR_DEPARTMENT_OTHER): Payer: Medicare Other | Admitting: Hematology and Oncology

## 2017-11-24 ENCOUNTER — Other Ambulatory Visit: Payer: Self-pay | Admitting: Hematology and Oncology

## 2017-11-24 VITALS — BP 154/85 | HR 84 | Temp 96.4°F | Resp 18 | Wt 141.4 lb

## 2017-11-24 DIAGNOSIS — G620 Drug-induced polyneuropathy: Secondary | ICD-10-CM | POA: Diagnosis not present

## 2017-11-24 DIAGNOSIS — D701 Agranulocytosis secondary to cancer chemotherapy: Secondary | ICD-10-CM

## 2017-11-24 DIAGNOSIS — Z5111 Encounter for antineoplastic chemotherapy: Secondary | ICD-10-CM

## 2017-11-24 DIAGNOSIS — T451X5A Adverse effect of antineoplastic and immunosuppressive drugs, initial encounter: Principal | ICD-10-CM

## 2017-11-24 DIAGNOSIS — C773 Secondary and unspecified malignant neoplasm of axilla and upper limb lymph nodes: Secondary | ICD-10-CM | POA: Diagnosis not present

## 2017-11-24 DIAGNOSIS — C50411 Malignant neoplasm of upper-outer quadrant of right female breast: Secondary | ICD-10-CM | POA: Diagnosis not present

## 2017-11-24 DIAGNOSIS — R634 Abnormal weight loss: Secondary | ICD-10-CM

## 2017-11-24 DIAGNOSIS — Z171 Estrogen receptor negative status [ER-]: Secondary | ICD-10-CM

## 2017-11-24 DIAGNOSIS — C50911 Malignant neoplasm of unspecified site of right female breast: Secondary | ICD-10-CM

## 2017-11-24 DIAGNOSIS — Z87891 Personal history of nicotine dependence: Secondary | ICD-10-CM

## 2017-11-24 DIAGNOSIS — R197 Diarrhea, unspecified: Secondary | ICD-10-CM

## 2017-11-24 DIAGNOSIS — E46 Unspecified protein-calorie malnutrition: Secondary | ICD-10-CM

## 2017-11-24 DIAGNOSIS — R110 Nausea: Secondary | ICD-10-CM

## 2017-11-24 LAB — CBC WITH DIFFERENTIAL/PLATELET
Basophils Absolute: 0 10*3/uL (ref 0–0.1)
Basophils Relative: 1 %
Eosinophils Absolute: 0 10*3/uL (ref 0–0.7)
Eosinophils Relative: 1 %
HCT: 32.1 % — ABNORMAL LOW (ref 35.0–47.0)
Hemoglobin: 11 g/dL — ABNORMAL LOW (ref 12.0–16.0)
Lymphocytes Relative: 20 %
Lymphs Abs: 0.7 10*3/uL — ABNORMAL LOW (ref 1.0–3.6)
MCH: 32.3 pg (ref 26.0–34.0)
MCHC: 34.1 g/dL (ref 32.0–36.0)
MCV: 94.8 fL (ref 80.0–100.0)
Monocytes Absolute: 0.5 10*3/uL (ref 0.2–0.9)
Monocytes Relative: 14 %
Neutro Abs: 2.4 10*3/uL (ref 1.4–6.5)
Neutrophils Relative %: 64 %
Platelets: 144 10*3/uL — ABNORMAL LOW (ref 150–440)
RBC: 3.39 MIL/uL — ABNORMAL LOW (ref 3.80–5.20)
RDW: 17 % — ABNORMAL HIGH (ref 11.5–14.5)
WBC: 3.8 10*3/uL (ref 3.6–11.0)

## 2017-11-24 LAB — COMPREHENSIVE METABOLIC PANEL
ALT: 26 U/L (ref 0–44)
AST: 28 U/L (ref 15–41)
Albumin: 4.1 g/dL (ref 3.5–5.0)
Alkaline Phosphatase: 58 U/L (ref 38–126)
Anion gap: 9 (ref 5–15)
BUN: 10 mg/dL (ref 8–23)
CO2: 24 mmol/L (ref 22–32)
Calcium: 9.2 mg/dL (ref 8.9–10.3)
Chloride: 103 mmol/L (ref 98–111)
Creatinine, Ser: 0.7 mg/dL (ref 0.44–1.00)
GFR calc Af Amer: >60 mL/min (ref >60)
GFR calc non Af Amer: >60 mL/min (ref >60)
Glucose, Bld: 126 mg/dL — ABNORMAL HIGH (ref 70–99)
Potassium: 3.7 mmol/L (ref 3.5–5.1)
Sodium: 136 mmol/L (ref 135–145)
Total Bilirubin: 0.6 mg/dL (ref 0.3–1.2)
Total Protein: 6.8 g/dL (ref 6.5–8.1)

## 2017-11-24 LAB — TSH: TSH: 2.3 u[IU]/mL (ref 0.350–4.500)

## 2017-11-24 LAB — FOLATE: Folate: 20.5 ng/mL (ref >5.9)

## 2017-11-24 LAB — MAGNESIUM: Magnesium: 2.1 mg/dL (ref 1.7–2.4)

## 2017-11-24 LAB — VITAMIN B12: Vitamin B-12: 855 pg/mL (ref 180–914)

## 2017-11-24 MED ORDER — FAMOTIDINE IN NACL 20-0.9 MG/50ML-% IV SOLN
20.0000 mg | Freq: Once | INTRAVENOUS | Status: AC
  Administered 2017-11-24: 20 mg via INTRAVENOUS
  Filled 2017-11-24: qty 50

## 2017-11-24 MED ORDER — PALONOSETRON HCL INJECTION 0.25 MG/5ML
0.2500 mg | Freq: Once | INTRAVENOUS | Status: AC
  Administered 2017-11-24: 0.25 mg via INTRAVENOUS
  Filled 2017-11-24: qty 5

## 2017-11-24 MED ORDER — HEPARIN SOD (PORK) LOCK FLUSH 100 UNIT/ML IV SOLN
500.0000 [IU] | Freq: Once | INTRAVENOUS | Status: AC
  Administered 2017-11-24: 500 [IU] via INTRAVENOUS
  Filled 2017-11-24: qty 5

## 2017-11-24 MED ORDER — FOSAPREPITANT DIMEGLUMINE INJECTION 150 MG
Freq: Once | INTRAVENOUS | Status: AC
  Administered 2017-11-24: 11:00:00 via INTRAVENOUS
  Filled 2017-11-24: qty 5

## 2017-11-24 MED ORDER — SODIUM CHLORIDE 0.9 % IV SOLN
Freq: Once | INTRAVENOUS | Status: AC
  Administered 2017-11-24: 10:00:00 via INTRAVENOUS
  Filled 2017-11-24: qty 1000

## 2017-11-24 MED ORDER — SODIUM CHLORIDE 0.9 % IV SOLN
80.0000 mg/m2 | Freq: Once | INTRAVENOUS | Status: AC
  Administered 2017-11-24: 150 mg via INTRAVENOUS
  Filled 2017-11-24: qty 25

## 2017-11-24 MED ORDER — DIPHENHYDRAMINE HCL 50 MG/ML IJ SOLN
50.0000 mg | Freq: Once | INTRAMUSCULAR | Status: AC
  Administered 2017-11-24: 50 mg via INTRAVENOUS
  Filled 2017-11-24: qty 1

## 2017-11-24 MED ORDER — SODIUM CHLORIDE 0.9% FLUSH
10.0000 mL | INTRAVENOUS | Status: DC | PRN
  Administered 2017-11-24: 10 mL via INTRAVENOUS
  Filled 2017-11-24: qty 10

## 2017-11-24 NOTE — Progress Notes (Signed)
Patient states she had some nausea after her last treatment.  Otherwise, offers no complaints.

## 2017-11-24 NOTE — Progress Notes (Signed)
[facility] day:  11/24/2017   Chief Complaint: Stephanie Sanders is a 72 y.o. female with multi-focal right breast cancer who is seen for assessment prior to week #7 Taxol.   HPI:  The patient was last seen in the medical oncology clinic on 11/17/2017.  At that time, she was feeling "great".  She had continued nausea and was using ondansetron.  She had dysgeusia.  Bowels had improved with the use of psyllium fiber.  Neuropathy was stable.  Exam was grossly unremarkable.  WBC was 600 with an ANC of 200.  Platelets 148,000.   Chemotherapy was held.  She received GCSF x 2 (07/30 and 07/31).  CBC on 11/20/2017 revealed a hematocrit of 33.7, hemoglobin 11.1, platelets 150,000, and WBC 3400 with an ANC of 2000.  During the interim, patient has been doing well overall. She advises bone patient and fatigue following her second a GCSF injection last week. She felt like a "freight train" hit her.  She took Claritin.  Patient denies nausea and vomiting. She continues to struggle with her bowels being loose, however notes that she is only stooling once a day at this point. She is off of her psyllium fiber. Patient described "gassy" abdominal bloating in the even hours, for which use uses simethicone.   Patient denies that she has experienced any B symptoms. She denies any interval infections. 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 Patient advises that she maintains an adequate appetite. She is eating well. Weight today is 141 lb 7 oz (64.2 kg), which compared to her last visit to the clinic, represents a  2 pound increase.   Patient denies pain in the clinic today.    Past Medical History:  Diagnosis Date  . Cancer ([place])   . Diabetes mellitus without complication ([place])   . Diabetic nephropathy ([place])   . Headache    migraines  . Heart murmur    ASYMPTOMATIC  . Hypertension   . Hypothyroidism   . Personal history of chemotherapy 2019    right breast cancer  . Seborrheic keratosis   . Vitamin D deficiency     Past Surgical History:  Procedure Laterality Date  . APPENDECTOMY    . BREAST BIOPSY Right 05/28/2017   right breast bx 3 areas invasive mamm ca lymph node mets  . BREAST CYST ASPIRATION Bilateral    neg  . CHOLECYSTECTOMY    . COLONOSCOPY WITH PROPOFOL N/A 01/01/2015   Procedure: COLONOSCOPY WITH PROPOFOL;  Surgeon: Josefine Class, MD;  Location[facility] ENDOSCOPY;  Service: Endoscopy;  Laterality: N/A;  . EYE SURGERY     eyelid  . PORTACATH PLACEMENT Right 06/10/2017   Procedure: INSERTION PORT-A-CATH;  Surgeon: Herbert Pun, MD;  Location: [facility] ORS;  Service: General;  Laterality: Right;  . STENT PLACEMENT ILIAC ([place] HX)    . TUBAL LIGATION      Family History  Problem Relation Age of Onset  . Cancer Mother   . Cancer Maternal Aunt   . Breast cancer Neg Hx     Social History:  reports that she quit smoking about 45 years ago. Her smoking use included cigarettes. She has a 20.00 pack-year smoking history. She has never used smokeless tobacco. She reports that she does not drink alcohol or use drugs.  She has 3 children (daughters: age 44 and 43; son Jaquelyn Bitter): age 35).  She is retired.  She worked for the [employer].  Her husband has dementia.  She lives in [place].  She is alone today.  Allergies:  Allergies  Allergen Reactions  . Fiorinal [Butalbital-Aspirin-Caffeine] Nausea And Vomiting and Other (See Comments)    SEVERE HEADACHE  . Sulfa Antibiotics Other (See Comments)    Burning esophagus and stomach  . Tramadol Nausea And Vomiting  . Other Rash    STERI-STRIPS-RASH STERI-STRIPS-RASH    Current Medications: Current Outpatient Medications  Medication Sig Dispense Refill  . acetaminophen (TYLENOL) 500 MG tablet Take 1,000 mg by mouth every 6 (six) hours as needed (for headaches.).    Marland Kitchen atorvastatin (LIPITOR) 10 MG tablet Take 10 mg by mouth every evening.     . Cholecalciferol  2000 UNITS CAPS Take 4,000 Units by mouth daily.     . colesevelam (WELCHOL) 625 MG tablet Take 1,875 mg by mouth every evening.     Marland Kitchen dexamethasone (DECADRON) 4 MG tablet Take 2 tablets by mouth once a day on the day after chemotherapy and then take 2 tablets two times a day for 2 days. Take with food. 30 tablet 1  . levothyroxine (SYNTHROID, LEVOTHROID) 50 MCG tablet Take 50 mcg by mouth daily before breakfast.    . lidocaine-prilocaine (EMLA) cream Apply 1 application topically as needed. 30 g 6  . loratadine (CLARITIN) 10 MG tablet Take 10 mg by mouth daily.    Marland Kitchen LORazepam (ATIVAN) 0.5 MG tablet Take 1 tablet (0.5 mg total) by mouth every 6 (six) hours as needed (Nausea or vomiting). 30 tablet 0  . MAGNESIUM GLYCINATE PLUS PO Take 100 mg by mouth.    . Melatonin 5 MG TABS Take 5 mg by mouth at bedtime as needed (for sleep.).    Marland Kitchen metFORMIN (GLUCOPHAGE) 1000 MG tablet Take 1,000 mg by mouth 2 (two) times daily.    Marland Kitchen nystatin (MYCOSTATIN) 100000 UNIT/ML suspension Take 5 mLs (500,000 Units total) by mouth 4 (four) times daily. 240 mL 0  . ondansetron (ZOFRAN) 8 MG tablet Take 1 tablet (8 mg total) by mouth 2 (two) times daily as needed. Start on the third day after chemotherapy. 30 tablet 1  . polyethylene glycol (MIRALAX / GLYCOLAX) packet Take 17 g by mouth daily.    . quinapril (ACCUPRIL) 5 MG tablet Take 5 mg by mouth every morning.     . sodium bicarbonate 650 MG tablet Take 1,300 mg by mouth at bedtime.    Marland Kitchen aspirin-acetaminophen-caffeine (EXCEDRIN MIGRAINE) 250-250-65 MG tablet Take 2 tablets by mouth every 6 (six) hours as needed for headache.     No current facility-administered medications for this visit.    Facility-Administered Medications Ordered in Other Visits  Medication Dose Route Frequency Provider Last Rate Last Dose  . sodium chloride flush (NS) 0.9 % injection 10 mL  10 mL Intravenous PRN Lequita Asal, MD   10 mL at 10/21/17 0841  . sodium chloride flush (NS) 0.9 %  injection 10 mL  10 mL Intravenous PRN Lequita Asal, MD   10 mL at 11/24/17 0846    Review of Systems  Constitutional: Positive for malaise/fatigue. Negative for diaphoresis, fever and weight loss (weight up 2 pounds).       "I feel ok"  HENT: Negative.        Dysgeusia  Eyes: Negative.   Respiratory: Negative for cough, hemoptysis, sputum production and shortness of breath.   Cardiovascular: Negative for chest pain, palpitations, orthopnea, leg swelling and PND.  Gastrointestinal: Positive for nausea (controlled). Negative for  abdominal pain, blood in stool, constipation, diarrhea, melena and vomiting.       Chronically loose stools; stopped Welchol.  Genitourinary: Negative for dysuria, frequency, hematuria and urgency.  Musculoskeletal: Negative for back pain, falls, joint pain and myalgias.  Skin: Negative for itching and rash.  Neurological: Positive for sensory change (neuropathy (stable) in LEFT hand and foot). Negative for dizziness, tremors, weakness and headaches.  Endo/Heme/Allergies: Does not bruise/bleed easily.       Type II diabetes on Metformin  Psychiatric/Behavioral: Negative for depression, memory loss and suicidal ideas. The patient is not nervous/anxious and does not have insomnia.   All other systems reviewed and are negative.  Performance status (ECOG): 1 - Symptomatic but completely ambulatory  Vital signs BP (!) 154/85 (BP Location: Left Arm, Patient Position: Sitting)   Pulse 84   Temp (!) 96.4 F (35.8 C) (Tympanic)   Resp 18   Wt 141 lb 7 oz (64.2 kg)   BMI 21.51 kg/m   Physical Exam  Constitutional: She is oriented to person, place, and time and well-developed, well-nourished, and in no distress.  HENT:  Head: Normocephalic and atraumatic.  Silver wig.  Eyes: Pupils are equal, round, and reactive to light. EOM are normal. No scleral icterus.  Blue eyes.  Neck: Normal range of motion. Neck supple. No tracheal deviation present. No  thyromegaly present.  Cardiovascular: Normal rate, regular rhythm and normal heart sounds. Exam reveals no gallop and no friction rub.  No murmur heard. Pulmonary/Chest: Effort normal and breath sounds normal. No respiratory distress. She has no wheezes. She has no rales.  Abdominal: Soft. Bowel sounds are normal. She exhibits no distension. There is no tenderness.  Musculoskeletal: Normal range of motion. She exhibits no edema or tenderness.  Lymphadenopathy:    She has no cervical adenopathy.    She has no axillary adenopathy.       Right: No inguinal and no supraclavicular adenopathy present.       Left: No inguinal and no supraclavicular adenopathy present.  Neurological: She is alert and oriented to person, place, and time.  Skin: Skin is warm and dry. No rash noted. No erythema.  Psychiatric: Mood, affect and judgment normal.  Nursing note and vitals reviewed.   Infusion on 11/24/2017  Component Date Value Ref Range Status  . TSH 11/24/2017 2.300  0.350 - 4.500 uIU/mL Final   Comment: Performed by a 3rd Generation assay with a functional sensitivity of <=0.01 uIU/mL. Performed at [facility], [address], [place], [place] [address]   . Folate 11/24/2017 20.5  >5.9 ng/mL Final   Performed at [facility], [address] [place], [place] [address]  . Vitamin B-12 11/24/2017 855  180 - 914 pg/mL Final   Comment: (NOTE) This assay is not validated for testing neonatal or myeloproliferative syndrome specimens for Vitamin B12 levels. Performed at [facility], [place] [address], [place], [place] [address]   . Magnesium 11/24/2017 2.1  1.7 - 2.4 mg/dL Final   Performed at [facility], [address], [place],  [address]  . Sodium 11/24/2017 136  135 - 145 mmol/L Final  . Potassium 11/24/2017 3.7  3.5 - 5.1 mmol/L Final  . Chloride 11/24/2017 103  98 - 111 mmol/L Final  . CO2 11/24/2017 24  22 - 32 mmol/L Final  . Glucose, Bld 11/24/2017  126* 70 - 99 mg/dL Final  . BUN 11/24/2017 10  8 - 23 mg/dL Final  . Creatinine,  Ser 11/24/2017 0.70  0.44 - 1.00 mg/dL Final  . Calcium 11/24/2017 9.2  8.9 - 10.3 mg/dL Final  . Total Protein 11/24/2017 6.8  6.5 - 8.1 g/dL Final  . Albumin 11/24/2017 4.1  3.5 - 5.0 g/dL Final  . AST 11/24/2017 28  15 - 41 U/L Final  . ALT 11/24/2017 26  0 - 44 U/L Final  . Alkaline Phosphatase 11/24/2017 58  38 - 126 U/L Final  . Total Bilirubin 11/24/2017 0.6  0.3 - 1.2 mg/dL Final  . GFR calc non Af Amer 11/24/2017 >60  >60 mL/min Final  . GFR calc Af Amer 11/24/2017 >60  >60 mL/min Final   Comment: (NOTE) The eGFR has been calculated using the CKD EPI equation. This calculation has not been validated in all clinical situations. eGFR's persistently <60 mL/min signify possible Chronic Kidney Disease.   Georgiann Hahn gap 11/24/2017 9  5 - 15 Final   Performed at [facility], [address] [place], [place] [address]  . WBC 11/24/2017 3.8  3.6 - 11.0 K/uL Final  . RBC 11/24/2017 3.39* 3.80 - 5.20 MIL/uL Final  . Hemoglobin 11/24/2017 11.0* 12.0 - 16.0 g/dL Final  . HCT 11/24/2017 32.1* 35.0 - 47.0 % Final  . MCV 11/24/2017 94.8  80.0 - 100.0 fL Final  . MCH 11/24/2017 32.3  26.0 - 34.0 pg Final  . MCHC 11/24/2017 34.1  32.0 - 36.0 g/dL Final  . RDW 11/24/2017 17.0* 11.5 - 14.5 % Final  . Platelets 11/24/2017 144* 150 - 440 K/uL Final  . Neutrophils Relative % 11/24/2017 64  % Final  . Neutro Abs 11/24/2017 2.4  1.4 - 6.5 K/uL Final  . Lymphocytes Relative 11/24/2017 20  % Final  . Lymphs Abs 11/24/2017 0.7* 1.0 - 3.6 K/uL Final  . Monocytes Relative 11/24/2017 14  % Final  . Monocytes Absolute 11/24/2017 0.5  0.2 - 0.9 K/uL Final  . Eosinophils Relative 11/24/2017 1  % Final  . Eosinophils Absolute 11/24/2017 0.0  0 - 0.7 K/uL Final  . Basophils Relative 11/24/2017 1  % Final  . Basophils Absolute 11/24/2017 0.0  0 - 0.1 K/uL Final   Performed at [facility], [address]  [place], [place] [address]    Assessment:  Stephanie Sanders is a 72 y.o. 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  Pathology revealed two grade II invasive mammary carcinomas of no special type (4 cm from the nipple and 5 cm from the nipple).  Tumors were in close proximity.  Lymph node biopsy confirmed metastatic carcinoma of breast origin.  Tumor is ER negative (< 1%), PR negative and Her 2/neu 2+ by IHC.  [facility] genetic testing on 07/21/2017 revealed a variant of uncertain significance in RECQL4.  Right sided mammogram and ultrasound on 05/21/2017 revealed 2 highly suspicious masses in the right breast at the 10:30 position 4 cm from nipple (1.8 x 1.2 x 1.1 cm) and at the 10:30 position 5 cm from nipple (1.1 x 0.9 x 0.9 cm).  There was a suspicious 2.7 x 1 x 2.2 cm lymph node in the right axilla with asymmetric nodular cortical thickening.  Ultrasound guided biopsy of the 2 masses and lymph node on 05/28/2017 revealed grade II invasive mammary carcinoma of no special type (4 cm from the nipple and 5 cm from the nipple).  Lymph node biopsy revealed metastatic carcinoma of breast origin.  Tumor is ER negative (<  1%), PR negative and Her 2/neu 2+ by IHC.  FISH was negative.  CA 27.29 was 16.5 and CA15-3 was 17.6 on 06/04/2017.  Echo on 06/09/2017 revealed an EF of 60-65%.  She has enrolled on the UPBEAT clinical trial.  She received 4 cycles of AC (06/23/2017 - 09/08/2017) with Udencya support.  Cycle #1 was complicated by influenza A.  Nadir counts included an ANC of 0 and platelet count of 71,000. Cycle #2 postponed due to slow recovery from influenza and excisional cyst removal with surrounding cellulitis (treated with 10 days of doxycycline).   She is s/p week #6 Taxol (09/29/2017 - 11/10/2017).  She received GCSF after week #3 and 5 Taxol secondary to borderline counts. Cycle #5 was postponed on 10/29/2017 due to patient's wishes to be  treated on Tuesdays only. Week #7 Taxol was held secondary to neutropenia (ANC 200).  Right mammogram and ultrasound on 09/28/2017 revealed the 2 adjacent masses in the right breast at the 10:30 o'clock position had both decreased in size (1.8 x 1.1 x 1.2 cm to 1.2 x 0.7 x 1.1 cm; 11 x 9 x 9 mm to 7 x 6 x 6 mm) as well as the metastatic right axillary lymph node (2 .7 cm to 1.6 x 0.9 x 1.7 cm).    She was admitted to [facility] from 06/28/2017 - 07/01/2017 with with fever, cough and generalized weakness.  Nasal swab was + for influenza A.  She completed a course of Tamiflu.  Symptomatically, patient is doing well overall. She notes bone pain and fatigue following GCSF last week. Patient denies that she has experienced any B symptoms. She denies any interval infections. No nausea or vomiting, however her stools are chronically loose. She is experiencing dysgeusia. Neuropathy is stable. Exam grossly unremarkable. WBC 3800 ([place] 2400). Platelets 144,000.   Plan: 1. Labs today:  CBC with diff, CMP, Mg, B12 , folate, TSH, copper. 2. Breast cancer - treatment ongoing  Tolerating treatments well overall with minimal side effects. Labs reviewed. Blood counts stable and adequate enough for treatment. Will proceed with cycle #7 Taxol.  Schedule follow-up breast ultrasound. Discuss symptom management.  Patient has antiemetics and pain medications at home to use on a PRN basis. Patient  advising that the  prescribed interventions are adequate at this point. Continue all medications as previously prescribed.  3. Chemotherapy induced neutropenia - stable  Counts improved nicely with GCSF x 2 last week. Will plan on GCSF on 11/26/2017. 4. Chemotherapy induced neuropathy - stable  Persistent symptom in LEFT hand and foot only. Stable overall. Notes that it is not limiting day to day function. Will continue to monitor.  5. Loose stools - ongoing  Patient has varying bowel habits. She insists on the need to have a  bowel movement daily despite education that once every 3 days is acceptable.  She has stopped OTC interventions at this point.   Encouraged patient to restart prescribed colesevelam in efforts to regain better control of her bowels.  6. Protein calorie malnutrition - stable  Weight fluctuating; up 2 pounds since last visits.  Her weight today is 141 lb 7 oz (64.2 kg). Her BMI of 21.51 kg/m places her in the normal weight category. Encouraged her to increase her intake of calorie and protein dense food choices. Additionally, patient was encouraged to utilize nutritional shakes to supplement her nutrition.  7. RTC on 11/26/2017 for labs (CBC with diff) and +/- GCSF 8. RTC in 1 week for MD assessment,  labs (CBC with diff, CMP, Mg) and week #8 Taxol.   Honor Loh, NP 11/24/2017,12:58 PM   I saw and evaluated the patient, participating in the key portions of the service and reviewing pertinent diagnostic studies and records.  I reviewed the nurse practitioner's note and agree with the findings and the plan.  The assessment and plan were discussed with the patient.  Multiple questions were asked by the patient and answered.   Nolon Stalls, MD 11/24/2017,12:58 PM

## 2017-11-26 ENCOUNTER — Inpatient Hospital Stay: Payer: Medicare Other

## 2017-11-26 DIAGNOSIS — C50211 Malignant neoplasm of upper-inner quadrant of right female breast: Secondary | ICD-10-CM

## 2017-11-26 DIAGNOSIS — Z5111 Encounter for antineoplastic chemotherapy: Secondary | ICD-10-CM | POA: Diagnosis not present

## 2017-11-26 DIAGNOSIS — Z171 Estrogen receptor negative status [ER-]: Principal | ICD-10-CM

## 2017-11-26 DIAGNOSIS — C50911 Malignant neoplasm of unspecified site of right female breast: Secondary | ICD-10-CM

## 2017-11-26 LAB — CBC WITH DIFFERENTIAL/PLATELET
Basophils Absolute: 0 10*3/uL (ref 0–0.1)
Basophils Relative: 1 %
Eosinophils Absolute: 0 10*3/uL (ref 0–0.7)
Eosinophils Relative: 1 %
HCT: 32.9 % — ABNORMAL LOW (ref 35.0–47.0)
Hemoglobin: 11 g/dL — ABNORMAL LOW (ref 12.0–16.0)
Lymphocytes Relative: 20 %
Lymphs Abs: 0.8 10*3/uL — ABNORMAL LOW (ref 1.0–3.6)
MCH: 31.6 pg (ref 26.0–34.0)
MCHC: 33.4 g/dL (ref 32.0–36.0)
MCV: 94.7 fL (ref 80.0–100.0)
Monocytes Absolute: 0.2 10*3/uL (ref 0.2–0.9)
Monocytes Relative: 5 %
Neutro Abs: 2.8 10*3/uL (ref 1.4–6.5)
Neutrophils Relative %: 73 %
Platelets: 148 10*3/uL — ABNORMAL LOW (ref 150–440)
RBC: 3.48 MIL/uL — ABNORMAL LOW (ref 3.80–5.20)
RDW: 17.1 % — ABNORMAL HIGH (ref 11.5–14.5)
WBC: 3.8 10*3/uL (ref 3.6–11.0)

## 2017-11-26 LAB — COMPREHENSIVE METABOLIC PANEL
ALT: 27 U/L (ref 0–44)
AST: 26 U/L (ref 15–41)
Albumin: 4.1 g/dL (ref 3.5–5.0)
Alkaline Phosphatase: 53 U/L (ref 38–126)
Anion gap: 11 (ref 5–15)
BUN: 15 mg/dL (ref 8–23)
CO2: 24 mmol/L (ref 22–32)
Calcium: 9.1 mg/dL (ref 8.9–10.3)
Chloride: 101 mmol/L (ref 98–111)
Creatinine, Ser: 0.73 mg/dL (ref 0.44–1.00)
GFR calc Af Amer: >60 mL/min (ref >60)
GFR calc non Af Amer: >60 mL/min (ref >60)
Glucose, Bld: 183 mg/dL — ABNORMAL HIGH (ref 70–99)
Potassium: 3.8 mmol/L (ref 3.5–5.1)
Sodium: 136 mmol/L (ref 135–145)
Total Bilirubin: 0.8 mg/dL (ref 0.3–1.2)
Total Protein: 6.6 g/dL (ref 6.5–8.1)

## 2017-11-26 LAB — COPPER, SERUM: Copper: 103 ug/dL (ref 72–166)

## 2017-11-30 ENCOUNTER — Ambulatory Visit
Admission: RE | Admit: 2017-11-30 | Discharge: 2017-11-30 | Disposition: A | Discharge status: Home / Self Care | Payer: Medicare Other | Source: Ambulatory Visit | Attending: Urgent Care | Admitting: Urgent Care

## 2017-11-30 DIAGNOSIS — Z171 Estrogen receptor negative status [ER-]: Secondary | ICD-10-CM | POA: Diagnosis present

## 2017-11-30 DIAGNOSIS — C50911 Malignant neoplasm of unspecified site of right female breast: Secondary | ICD-10-CM | POA: Diagnosis not present

## 2017-12-01 ENCOUNTER — Inpatient Hospital Stay: Payer: Medicare Other

## 2017-12-01 ENCOUNTER — Inpatient Hospital Stay (HOSPITAL_BASED_OUTPATIENT_CLINIC_OR_DEPARTMENT_OTHER): Payer: Medicare Other | Admitting: Hematology and Oncology

## 2017-12-01 ENCOUNTER — Encounter: Payer: Self-pay | Admitting: Hematology and Oncology

## 2017-12-01 VITALS — BP 138/74 | HR 87 | Temp 97.2°F | Resp 18 | Wt 142.2 lb

## 2017-12-01 DIAGNOSIS — R110 Nausea: Secondary | ICD-10-CM

## 2017-12-01 DIAGNOSIS — G620 Drug-induced polyneuropathy: Secondary | ICD-10-CM | POA: Diagnosis not present

## 2017-12-01 DIAGNOSIS — Z5111 Encounter for antineoplastic chemotherapy: Secondary | ICD-10-CM

## 2017-12-01 DIAGNOSIS — Z171 Estrogen receptor negative status [ER-]: Principal | ICD-10-CM

## 2017-12-01 DIAGNOSIS — C773 Secondary and unspecified malignant neoplasm of axilla and upper limb lymph nodes: Secondary | ICD-10-CM | POA: Diagnosis not present

## 2017-12-01 DIAGNOSIS — C50911 Malignant neoplasm of unspecified site of right female breast: Secondary | ICD-10-CM

## 2017-12-01 DIAGNOSIS — C50211 Malignant neoplasm of upper-inner quadrant of right female breast: Secondary | ICD-10-CM

## 2017-12-01 DIAGNOSIS — C50411 Malignant neoplasm of upper-outer quadrant of right female breast: Secondary | ICD-10-CM | POA: Diagnosis not present

## 2017-12-01 DIAGNOSIS — Z87891 Personal history of nicotine dependence: Secondary | ICD-10-CM

## 2017-12-01 DIAGNOSIS — R197 Diarrhea, unspecified: Secondary | ICD-10-CM

## 2017-12-01 DIAGNOSIS — T451X5A Adverse effect of antineoplastic and immunosuppressive drugs, initial encounter: Secondary | ICD-10-CM

## 2017-12-01 DIAGNOSIS — D701 Agranulocytosis secondary to cancer chemotherapy: Secondary | ICD-10-CM | POA: Diagnosis not present

## 2017-12-01 DIAGNOSIS — K5900 Constipation, unspecified: Secondary | ICD-10-CM

## 2017-12-01 LAB — CBC WITH DIFFERENTIAL/PLATELET
Basophils Absolute: 0 10*3/uL (ref 0–0.1)
Basophils Relative: 1 %
Eosinophils Absolute: 0.1 10*3/uL (ref 0–0.7)
Eosinophils Relative: 2 %
HCT: 30 % — ABNORMAL LOW (ref 35.0–47.0)
Hemoglobin: 10.3 g/dL — ABNORMAL LOW (ref 12.0–16.0)
Lymphocytes Relative: 17 %
Lymphs Abs: 0.5 10*3/uL — ABNORMAL LOW (ref 1.0–3.6)
MCH: 32.4 pg (ref 26.0–34.0)
MCHC: 34.5 g/dL (ref 32.0–36.0)
MCV: 94.1 fL (ref 80.0–100.0)
Monocytes Absolute: 0.3 10*3/uL (ref 0.2–0.9)
Monocytes Relative: 10 %
Neutro Abs: 2.1 10*3/uL (ref 1.4–6.5)
Neutrophils Relative %: 70 %
Platelets: 147 10*3/uL — ABNORMAL LOW (ref 150–440)
RBC: 3.18 MIL/uL — ABNORMAL LOW (ref 3.80–5.20)
RDW: 16.4 % — ABNORMAL HIGH (ref 11.5–14.5)
WBC: 2.9 10*3/uL — ABNORMAL LOW (ref 3.6–11.0)

## 2017-12-01 LAB — MAGNESIUM: Magnesium: 2.1 mg/dL (ref 1.7–2.4)

## 2017-12-01 LAB — COMPREHENSIVE METABOLIC PANEL
ALT: 48 U/L — ABNORMAL HIGH (ref 0–44)
AST: 47 U/L — ABNORMAL HIGH (ref 15–41)
Albumin: 4.1 g/dL (ref 3.5–5.0)
Alkaline Phosphatase: 62 U/L (ref 38–126)
Anion gap: 9 (ref 5–15)
BUN: 11 mg/dL (ref 8–23)
CO2: 25 mmol/L (ref 22–32)
Calcium: 9.3 mg/dL (ref 8.9–10.3)
Chloride: 103 mmol/L (ref 98–111)
Creatinine, Ser: 0.64 mg/dL (ref 0.44–1.00)
GFR calc Af Amer: >60 mL/min (ref >60)
GFR calc non Af Amer: >60 mL/min (ref >60)
Glucose, Bld: 128 mg/dL — ABNORMAL HIGH (ref 70–99)
Potassium: 3.7 mmol/L (ref 3.5–5.1)
Sodium: 137 mmol/L (ref 135–145)
Total Bilirubin: 0.7 mg/dL (ref 0.3–1.2)
Total Protein: 6.4 g/dL — ABNORMAL LOW (ref 6.5–8.1)

## 2017-12-01 MED ORDER — DEXAMETHASONE SODIUM PHOSPHATE 100 MG/10ML IJ SOLN
20.0000 mg | Freq: Once | INTRAMUSCULAR | Status: AC
  Administered 2017-12-01: 20 mg via INTRAVENOUS
  Filled 2017-12-01: qty 2

## 2017-12-01 MED ORDER — LORAZEPAM 0.5 MG PO TABS: 0.5000 mg | ORAL_TABLET | Freq: Four times a day (QID) | ORAL | 0 refills | Status: DC | PRN

## 2017-12-01 MED ORDER — SODIUM CHLORIDE 0.9 % IV SOLN
80.0000 mg/m2 | Freq: Once | INTRAVENOUS | Status: AC
  Administered 2017-12-01: 150 mg via INTRAVENOUS
  Filled 2017-12-01: qty 25

## 2017-12-01 MED ORDER — DIPHENHYDRAMINE HCL 50 MG/ML IJ SOLN
50.0000 mg | Freq: Once | INTRAMUSCULAR | Status: AC
  Administered 2017-12-01: 50 mg via INTRAVENOUS
  Filled 2017-12-01: qty 1

## 2017-12-01 MED ORDER — HEPARIN SOD (PORK) LOCK FLUSH 100 UNIT/ML IV SOLN
500.0000 [IU] | Freq: Once | INTRAVENOUS | Status: AC | PRN
  Administered 2017-12-01: 500 [IU]

## 2017-12-01 MED ORDER — SODIUM CHLORIDE 0.9 % IV SOLN
Freq: Once | INTRAVENOUS | Status: AC
  Administered 2017-12-01: 10:00:00 via INTRAVENOUS
  Filled 2017-12-01: qty 1000

## 2017-12-01 MED ORDER — FAMOTIDINE IN NACL 20-0.9 MG/50ML-% IV SOLN
20.0000 mg | Freq: Once | INTRAVENOUS | Status: AC
  Administered 2017-12-01: 20 mg via INTRAVENOUS
  Filled 2017-12-01: qty 50

## 2017-12-01 NOTE — Progress Notes (Signed)
Patient requesting refill for Ativan.  Is having some constipation.

## 2017-12-01 NOTE — Progress Notes (Signed)
[facility] day:  12/01/2017   Chief Complaint: Stephanie Sanders is a 72 y.o. female with multi-focal right breast cancer who is seen for assessment prior to week #8 Taxol.   HPI:  The patient was last seen in the medical oncology clinic on 11/24/2017.  At that time, she was doing well overall. She had bone pain and fatigue following GCSF the prior week. Patient denied any B symptoms. She denied any interval infections. No nausea or vomiting, however her stools were noted to be chronically loose. She was experiencing dysgeusia. Neuropathy was stable. Exam was grossly unremarkable. WBC was 3800 ([place] 2400). Platelets 144,000. B12 was 855 and folate 20.5.  TSH was 2.3.  Copper was 103.  She received week #7 Taxol.  [place] on 11/26/2017 was 2800.  Patient had repeat ultrasound imaging of her RIGHT breast on 11/30/2017 that demonstrated no significant change in size from previous imaging. Mass located 5 cm from the nipple measured 4 x 8 x 6 mm (previously 6 x 7 x 6 mm). The mass located 4 cm from the nipple measured 1.1 x 0.6 x 1.4 cm (previously 1.2 x 0.7 x 1.1 cm). There was a palpable hyperechoic nodule located at the RIGHT sternal border measuring 1.2 x 1.0 x 1.6 cm (previously 0.7 x 0.7 x 0.6 cm) favored to represent a benign epidermal inclusion cyst.   During the interim, patient is doing well overall. She is having constipation. She has taken Welchol x 1 day. She is trying to be active and drink extra fluids. Patient notes that she feels "dragged out and yucky" until day 4 following treatment. Patient denies that she has experienced any B symptoms. She denies any interval infections. She notes "fleeting" neuropathy in her LEFT hand and foot that poses no significant issues or limitations.   Patient advises that she maintains an adequate appetite. She notes that she is eating "really well". Dysgeusia has improved. Weight today is 142 lb 4 oz (64.5 kg), which  compared to her last visit to the clinic, represents a 1 pound increase.   Patient denies pain in the clinic today.   Past Medical History:  Diagnosis Date  . Cancer ([place])   . Diabetes mellitus without complication ([place])   . Diabetic nephropathy ([place])   . Headache    migraines  . Heart murmur    ASYMPTOMATIC  . Hypertension   . Hypothyroidism   . Personal history of chemotherapy 2019   right breast cancer  . Seborrheic keratosis   . Vitamin D deficiency     Past Surgical History:  Procedure Laterality Date  . APPENDECTOMY    . BREAST BIOPSY Right 05/28/2017   right breast bx 3 areas invasive mamm ca lymph node mets  . BREAST CYST ASPIRATION Bilateral    neg  . CHOLECYSTECTOMY    . COLONOSCOPY WITH PROPOFOL N/A 01/01/2015   Procedure: COLONOSCOPY WITH PROPOFOL;  Surgeon: Josefine Class, MD;  Location[facility] ENDOSCOPY;  Service: Endoscopy;  Laterality: N/A;  . EYE SURGERY     eyelid  . PORTACATH PLACEMENT Right 06/10/2017   Procedure: INSERTION PORT-A-CATH;  Surgeon: Herbert Pun, MD;  Location: [facility] ORS;  Service: General;  Laterality: Right;  . STENT PLACEMENT ILIAC ([place] HX)    . TUBAL LIGATION      Family History  Problem Relation Age of Onset  . Cancer Mother   . Cancer Maternal Aunt   . Breast cancer Neg Hx  Social History:  reports that she quit smoking about 45 years ago. Her smoking use included cigarettes. She has a 20.00 pack-year smoking history. She has never used smokeless tobacco. She reports that she does not drink alcohol or use drugs.  She has 3 children (daughters: age 12 and 40; son Jaquelyn Bitter): age 15).  She is retired.  She worked for the [employer].  Her husband has dementia.  She lives in [place].  She is alone today.  Allergies:  Allergies  Allergen Reactions  . Fiorinal [Butalbital-Aspirin-Caffeine] Nausea And Vomiting and Other (See Comments)    SEVERE HEADACHE  . Sulfa Antibiotics Other (See Comments)    Burning esophagus and  stomach  . Tramadol Nausea And Vomiting  . Other Rash    STERI-STRIPS-RASH STERI-STRIPS-RASH    Current Medications: Current Outpatient Medications  Medication Sig Dispense Refill  . acetaminophen (TYLENOL) 500 MG tablet Take 1,000 mg by mouth every 6 (six) hours as needed (for headaches.).    Marland Kitchen aspirin-acetaminophen-caffeine (EXCEDRIN MIGRAINE) 250-250-65 MG tablet Take 2 tablets by mouth every 6 (six) hours as needed for headache.    Marland Kitchen atorvastatin (LIPITOR) 10 MG tablet Take 10 mg by mouth every evening.     . Cholecalciferol 2000 UNITS CAPS Take 4,000 Units by mouth daily.     . colesevelam (WELCHOL) 625 MG tablet Take 1,875 mg by mouth every evening.     Marland Kitchen dexamethasone (DECADRON) 4 MG tablet Take 2 tablets by mouth once a day on the day after chemotherapy and then take 2 tablets two times a day for 2 days. Take with food. 30 tablet 1  . levothyroxine (SYNTHROID, LEVOTHROID) 50 MCG tablet Take 50 mcg by mouth daily before breakfast.    . lidocaine-prilocaine (EMLA) cream Apply 1 application topically as needed. 30 g 6  . loratadine (CLARITIN) 10 MG tablet Take 10 mg by mouth daily.    Marland Kitchen LORazepam (ATIVAN) 0.5 MG tablet Take 1 tablet (0.5 mg total) by mouth every 6 (six) hours as needed (Nausea or vomiting). 30 tablet 0  . MAGNESIUM GLYCINATE PLUS PO Take 100 mg by mouth.    . Melatonin 5 MG TABS Take 5 mg by mouth at bedtime as needed (for sleep.).    Marland Kitchen metFORMIN (GLUCOPHAGE) 1000 MG tablet Take 1,000 mg by mouth 2 (two) times daily.    Marland Kitchen nystatin (MYCOSTATIN) 100000 UNIT/ML suspension Take 5 mLs (500,000 Units total) by mouth 4 (four) times daily. 240 mL 0  . ondansetron (ZOFRAN) 8 MG tablet Take 1 tablet (8 mg total) by mouth 2 (two) times daily as needed. Start on the third day after chemotherapy. 30 tablet 1  . polyethylene glycol (MIRALAX / GLYCOLAX) packet Take 17 g by mouth daily.    . quinapril (ACCUPRIL) 5 MG tablet Take 5 mg by mouth every morning.     . sodium bicarbonate  650 MG tablet Take 1,300 mg by mouth at bedtime.     No current facility-administered medications for this visit.    Facility-Administered Medications Ordered in Other Visits  Medication Dose Route Frequency Provider Last Rate Last Dose  . sodium chloride flush (NS) 0.9 % injection 10 mL  10 mL Intravenous PRN Lequita Asal, MD   10 mL at 10/21/17 0841    Review of Systems  Constitutional: Positive for malaise/fatigue. Negative for diaphoresis, fever and weight loss (weight up 2 pounds).       Feels "pretty good".  HENT: Negative.  Taste sensation improved.  Eyes: Negative.   Respiratory: Negative for cough, hemoptysis, sputum production and shortness of breath.   Cardiovascular: Negative for chest pain, palpitations, orthopnea, leg swelling and PND.  Gastrointestinal: Positive for constipation and nausea (controlled). Negative for abdominal pain, blood in stool, diarrhea, melena and vomiting.       Chronically loose stools.  Took Welchol x 1.  Genitourinary: Negative for dysuria, frequency, hematuria and urgency.  Musculoskeletal: Negative for back pain, falls, joint pain and myalgias.  Skin: Negative for itching and rash.  Neurological: Positive for sensory change (minimal neuropathy- fleeting left side). Negative for dizziness, tremors, weakness and headaches.  Endo/Heme/Allergies: Does not bruise/bleed easily.       Type II diabetes on Metformin  Psychiatric/Behavioral: Negative for depression, memory loss and suicidal ideas. The patient is not nervous/anxious and does not have insomnia.   All other systems reviewed and are negative.  Performance status (ECOG): 1 - Symptomatic but completely ambulatory  Vital signs BP 138/74 (BP Location: Left Arm, Patient Position: Sitting)   Pulse 87   Temp (!) 97.2 F (36.2 C) (Tympanic)   Resp 18   Wt 142 lb 4 oz (64.5 kg)   BMI 21.63 kg/m   Physical Exam  Constitutional: She is oriented to person, place, and time and  well-developed, well-nourished, and in no distress.  HENT:  Head: Normocephalic and atraumatic.  Wearing a blue wrap.  Alopecia.  Eyes: Pupils are equal, round, and reactive to light. EOM are normal. No scleral icterus.  Glasses.  Blue eyes  Neck: Normal range of motion. Neck supple. No tracheal deviation present. No thyromegaly present.  Cardiovascular: Normal rate, regular rhythm and normal heart sounds. Exam reveals no gallop and no friction rub.  No murmur heard. Pulmonary/Chest: Effort normal and breath sounds normal. No respiratory distress. She has no wheezes. She has no rales.  Abdominal: Soft. Bowel sounds are normal. She exhibits no distension. There is no tenderness.  Musculoskeletal: Normal range of motion. She exhibits no edema or tenderness.  Lymphadenopathy:    She has no cervical adenopathy.    She has no axillary adenopathy.       Right: No supraclavicular adenopathy present.       Left: No supraclavicular adenopathy present.  Neurological: She is alert and oriented to person, place, and time.  Skin: Skin is warm and dry. No rash noted. No erythema.  Psychiatric: Mood, affect and judgment normal.  Nursing note and vitals reviewed.   Infusion on 12/01/2017  Component Date Value Ref Range Status  . Magnesium 12/01/2017 2.1  1.7 - 2.4 mg/dL Final   Performed at [facility], [address], [place], [place] [address]  . Sodium 12/01/2017 137  135 - 145 mmol/L Final  . Potassium 12/01/2017 3.7  3.5 - 5.1 mmol/L Final  . Chloride 12/01/2017 103  98 - 111 mmol/L Final  . CO2 12/01/2017 25  22 - 32 mmol/L Final  . Glucose, Bld 12/01/2017 128* 70 - 99 mg/dL Final  . BUN 12/01/2017 11  8 - 23 mg/dL Final  . Creatinine, Ser 12/01/2017 0.64  0.44 - 1.00 mg/dL Final  . Calcium 12/01/2017 9.3  8.9 - 10.3 mg/dL Final  . Total Protein 12/01/2017 6.4* 6.5 - 8.1 g/dL Final  . Albumin 12/01/2017 4.1  3.5 - 5.0 g/dL Final  . AST 12/01/2017 47* 15 - 41 U/L Final  . ALT  12/01/2017 48* 0 - 44 U/L Final  . Alkaline Phosphatase 12/01/2017 62  38 - 126 U/L Final  . Total Bilirubin 12/01/2017 0.7  0.3 - 1.2 mg/dL Final  . GFR calc non Af Amer 12/01/2017 >60  >60 mL/min Final  . GFR calc Af Amer 12/01/2017 >60  >60 mL/min Final   Comment: (NOTE) The eGFR has been calculated using the CKD EPI equation. This calculation has not been validated in all clinical situations. eGFR's persistently <60 mL/min signify possible Chronic Kidney Disease.   Georgiann Hahn gap 12/01/2017 9  5 - 15 Final   Performed at [facility], [address] [place], [place] [address]  . WBC 12/01/2017 2.9* 3.6 - 11.0 K/uL Final  . RBC 12/01/2017 3.18* 3.80 - 5.20 MIL/uL Final  . Hemoglobin 12/01/2017 10.3* 12.0 - 16.0 g/dL Final  . HCT 12/01/2017 30.0* 35.0 - 47.0 % Final  . MCV 12/01/2017 94.1  80.0 - 100.0 fL Final  . MCH 12/01/2017 32.4  26.0 - 34.0 pg Final  . MCHC 12/01/2017 34.5  32.0 - 36.0 g/dL Final  . RDW 12/01/2017 16.4* 11.5 - 14.5 % Final  . Platelets 12/01/2017 147* 150 - 440 K/uL Final  . Neutrophils Relative % 12/01/2017 70  % Final  . Neutro Abs 12/01/2017 2.1  1.4 - 6.5 K/uL Final  . Lymphocytes Relative 12/01/2017 17  % Final  . Lymphs Abs 12/01/2017 0.5* 1.0 - 3.6 K/uL Final  . Monocytes Relative 12/01/2017 10  % Final  . Monocytes Absolute 12/01/2017 0.3  0.2 - 0.9 K/uL Final  . Eosinophils Relative 12/01/2017 2  % Final  . Eosinophils Absolute 12/01/2017 0.1  0 - 0.7 K/uL Final  . Basophils Relative 12/01/2017 1  % Final  . Basophils Absolute 12/01/2017 0.0  0 - 0.1 K/uL Final   Performed at [facility], [address] [place], [place] [address]    Assessment:  RABECKA BRENDEL is a 72 y.o. 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  Pathology revealed two grade II invasive mammary carcinomas of no special type (4 cm from the nipple and 5 cm from the nipple).  Tumors were in close  proximity.  Lymph node biopsy confirmed metastatic carcinoma of breast origin.  Tumor is ER negative (< 1%), PR negative and Her 2/neu 2+ by IHC.  [facility] genetic testing on 07/21/2017 revealed a variant of uncertain significance in RECQL4.  Right sided mammogram and ultrasound on 05/21/2017 revealed 2 highly suspicious masses in the right breast at the 10:30 position 4 cm from nipple (1.8 x 1.2 x 1.1 cm) and at the 10:30 position 5 cm from nipple (1.1 x 0.9 x 0.9 cm).  There was a suspicious 2.7 x 1 x 2.2 cm lymph node in the right axilla with asymmetric nodular cortical thickening.  Ultrasound guided biopsy of the 2 masses and lymph node on 05/28/2017 revealed grade II invasive mammary carcinoma of no special type (4 cm from the nipple and 5 cm from the nipple).  Lymph node biopsy revealed metastatic carcinoma of breast origin.  Tumor is ER negative (< 1%), PR negative and Her 2/neu 2+ by IHC.  FISH was negative.  CA 27.29 was 16.5 and CA15-3 was 17.6 on 06/04/2017.  Echo on 06/09/2017 revealed an EF of 60-65%.  She has enrolled on the UPBEAT clinical trial.  She received 4 cycles of AC (06/23/2017 - 09/08/2017) with Udencya support.  Cycle #1 was complicated by influenza A.  Nadir counts included an ANC of 0 and  platelet count of 71,000. Cycle #2 postponed due to slow recovery from influenza and excisional cyst removal with surrounding cellulitis (treated with 10 days of doxycycline).   She is s/p week #7 Taxol (09/29/2017 - 11/10/2017; 11/24/2017).  She received GCSF after week #3 and 5 Taxol secondary to borderline counts. Cycle #5 was postponed on 10/29/2017 due to patient's wishes to be treated on Tuesdays only. Week #7 Taxol was held secondary to neutropenia (ANC 200).  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  Normal labs on 11/24/2017 included:  B12, folate, TSH, and copper.  Right mammogram and ultrasound on 09/28/2017 revealed the 2 adjacent masses in the right breast at the 10:30  o'clock position had both decreased in size (1.8 x 1.1 x 1.2 cm to 1.2 x 0.7 x 1.1 cm; 11 x 9 x 9 mm to 7 x 6 x 6 mm) as well as the metastatic right axillary lymph node (2 .7 cm to 1.6 x 0.9 x 1.7 cm).    Right sided ultrasound on 11/30/2017 demonstrated no significant change in size from previous imaging. Mass located 5 cm from the nipple measured 4 x 8 x 6 mm (previously 6 x 7 x 6 mm). The mass located 4 cm from the nipple measured 1.1 x 0.6 x 1.4 cm (previously 1.2 x 0.7 x 1.1 cm). There was a palpable hyperechoic nodule located at the RIGHT sternal border measuring 1.2 x 1.0 x 1.6 cm (previously 0.7 x 0.7 x 0.6 cm) favored to represent a benign epidermal inclusion cyst (old lesion).   She was admitted to [facility] from 06/28/2017 - 07/01/2017 with with fever, cough and generalized weakness.  Nasal swab was + for influenza A.  She completed a course of Tamiflu.  Symptomatically, she is doing well.  She has had some constipation during the interim.  Neuropathy is minimal.  Exam is stable.  WBC is 2900 ([place] 2100).  Plan: 1. Labs today:  CBC with diff, CMP, Mg. 2. Breast cancer:  Discuss interval breast ultrasound.  By report masses are stable.    Lesion 5 cm from nipple:  252 mm3 to 192 mm3.  Lesion 4 cm from nipple:  924 mm3 to 924 mm3.  Continue weekly Taxol with GCSF support to maintain counts and dense dense therapy. 3. Chemotherapy induced neutropenia:  Work-up revealed no secondary cause of neutropenia except for chemotherapy  Continue GCSF support. 4. Chemotherapy induced neuropathy:  Neuropathy minimal.  Continue chemotherapy without dose reduction. 5.  Constipation and diarrhea:  Patient has had issues with loose stools since cholecystectomy.  Welchol x 1 resulted in constipation.  Discuss ongoing management. 6.  Week #8 Taxol today. 7.  RTC on 12/02/2017 for GCSF 8.  RTC on 12/04/2017 for labs (CBC with diff) and +/- GCSF 9.  RTC in 1 week for MD assessment, labs (CBC with diff,  CMP, Mg) and week #9 Taxol.   Honor Loh, NP 12/01/2017,9:31 AM   I saw and evaluated the patient, participating in the key portions of the service and reviewing pertinent diagnostic studies and records.  I reviewed the nurse practitioner's note and agree with the findings and the plan.  The assessment and plan were discussed with the patient.  Multiple questions were asked by the patient and answered.   Nolon Stalls, MD 12/01/2017,9:31 AM

## 2017-12-02 ENCOUNTER — Inpatient Hospital Stay: Payer: Medicare Other

## 2017-12-02 DIAGNOSIS — R110 Nausea: Secondary | ICD-10-CM

## 2017-12-02 DIAGNOSIS — Z5111 Encounter for antineoplastic chemotherapy: Secondary | ICD-10-CM | POA: Diagnosis not present

## 2017-12-02 DIAGNOSIS — T451X5A Adverse effect of antineoplastic and immunosuppressive drugs, initial encounter: Principal | ICD-10-CM

## 2017-12-02 MED ORDER — TBO-FILGRASTIM 300 MCG/0.5ML ~~LOC~~ SOSY
480.0000 ug | PREFILLED_SYRINGE | Freq: Once | SUBCUTANEOUS | Status: AC
  Administered 2017-12-02: 480 ug via SUBCUTANEOUS

## 2017-12-04 ENCOUNTER — Inpatient Hospital Stay: Payer: Medicare Other

## 2017-12-04 DIAGNOSIS — Z5111 Encounter for antineoplastic chemotherapy: Secondary | ICD-10-CM | POA: Diagnosis not present

## 2017-12-04 DIAGNOSIS — Z171 Estrogen receptor negative status [ER-]: Principal | ICD-10-CM

## 2017-12-04 DIAGNOSIS — C50211 Malignant neoplasm of upper-inner quadrant of right female breast: Secondary | ICD-10-CM

## 2017-12-04 LAB — COMPREHENSIVE METABOLIC PANEL
ALT: 37 U/L (ref 0–44)
AST: 25 U/L (ref 15–41)
Albumin: 4.3 g/dL (ref 3.5–5.0)
Alkaline Phosphatase: 62 U/L (ref 38–126)
Anion gap: 11 (ref 5–15)
BUN: 10 mg/dL (ref 8–23)
CO2: 22 mmol/L (ref 22–32)
Calcium: 9.2 mg/dL (ref 8.9–10.3)
Chloride: 105 mmol/L (ref 98–111)
Creatinine, Ser: 0.76 mg/dL (ref 0.44–1.00)
GFR calc Af Amer: >60 mL/min (ref >60)
GFR calc non Af Amer: >60 mL/min (ref >60)
Glucose, Bld: 117 mg/dL — ABNORMAL HIGH (ref 70–99)
Potassium: 4 mmol/L (ref 3.5–5.1)
Sodium: 138 mmol/L (ref 135–145)
Total Bilirubin: 0.6 mg/dL (ref 0.3–1.2)
Total Protein: 6.6 g/dL (ref 6.5–8.1)

## 2017-12-04 LAB — CBC WITH DIFFERENTIAL/PLATELET
Basophils Absolute: 0 10*3/uL (ref 0–0.1)
Basophils Relative: 0 %
Eosinophils Absolute: 0 10*3/uL (ref 0–0.7)
Eosinophils Relative: 0 %
HCT: 32.6 % — ABNORMAL LOW (ref 35.0–47.0)
Hemoglobin: 10.9 g/dL — ABNORMAL LOW (ref 12.0–16.0)
Lymphocytes Relative: 9 %
Lymphs Abs: 0.8 10*3/uL — ABNORMAL LOW (ref 1.0–3.6)
MCH: 32.2 pg (ref 26.0–34.0)
MCHC: 33.5 g/dL (ref 32.0–36.0)
MCV: 96.2 fL (ref 80.0–100.0)
Monocytes Absolute: 0.4 10*3/uL (ref 0.2–0.9)
Monocytes Relative: 4 %
Neutro Abs: 7.7 10*3/uL — ABNORMAL HIGH (ref 1.4–6.5)
Neutrophils Relative %: 87 %
Platelets: 155 10*3/uL (ref 150–440)
RBC: 3.39 MIL/uL — ABNORMAL LOW (ref 3.80–5.20)
RDW: 16.7 % — ABNORMAL HIGH (ref 11.5–14.5)
WBC: 8.9 10*3/uL (ref 3.6–11.0)

## 2017-12-07 ENCOUNTER — Other Ambulatory Visit: Payer: Self-pay | Admitting: *Deleted

## 2017-12-07 DIAGNOSIS — Z171 Estrogen receptor negative status [ER-]: Principal | ICD-10-CM

## 2017-12-07 DIAGNOSIS — C50911 Malignant neoplasm of unspecified site of right female breast: Secondary | ICD-10-CM

## 2017-12-08 ENCOUNTER — Encounter: Payer: Self-pay | Admitting: Hematology and Oncology

## 2017-12-08 ENCOUNTER — Inpatient Hospital Stay (HOSPITAL_BASED_OUTPATIENT_CLINIC_OR_DEPARTMENT_OTHER): Payer: Medicare Other | Admitting: Hematology and Oncology

## 2017-12-08 ENCOUNTER — Inpatient Hospital Stay: Payer: Medicare Other

## 2017-12-08 ENCOUNTER — Other Ambulatory Visit: Payer: Self-pay

## 2017-12-08 VITALS — BP 138/74 | HR 73 | Temp 98.3°F | Resp 18 | Wt 140.2 lb

## 2017-12-08 DIAGNOSIS — E119 Type 2 diabetes mellitus without complications: Secondary | ICD-10-CM

## 2017-12-08 DIAGNOSIS — Z5111 Encounter for antineoplastic chemotherapy: Secondary | ICD-10-CM

## 2017-12-08 DIAGNOSIS — C773 Secondary and unspecified malignant neoplasm of axilla and upper limb lymph nodes: Secondary | ICD-10-CM | POA: Diagnosis not present

## 2017-12-08 DIAGNOSIS — R110 Nausea: Secondary | ICD-10-CM

## 2017-12-08 DIAGNOSIS — I10 Essential (primary) hypertension: Secondary | ICD-10-CM

## 2017-12-08 DIAGNOSIS — G620 Drug-induced polyneuropathy: Secondary | ICD-10-CM

## 2017-12-08 DIAGNOSIS — D701 Agranulocytosis secondary to cancer chemotherapy: Secondary | ICD-10-CM

## 2017-12-08 DIAGNOSIS — C50911 Malignant neoplasm of unspecified site of right female breast: Secondary | ICD-10-CM

## 2017-12-08 DIAGNOSIS — Z87891 Personal history of nicotine dependence: Secondary | ICD-10-CM

## 2017-12-08 DIAGNOSIS — T451X5A Adverse effect of antineoplastic and immunosuppressive drugs, initial encounter: Secondary | ICD-10-CM

## 2017-12-08 DIAGNOSIS — Z171 Estrogen receptor negative status [ER-]: Principal | ICD-10-CM

## 2017-12-08 DIAGNOSIS — C50411 Malignant neoplasm of upper-outer quadrant of right female breast: Secondary | ICD-10-CM | POA: Diagnosis not present

## 2017-12-08 DIAGNOSIS — R197 Diarrhea, unspecified: Secondary | ICD-10-CM

## 2017-12-08 DIAGNOSIS — R634 Abnormal weight loss: Secondary | ICD-10-CM

## 2017-12-08 LAB — CBC WITH DIFFERENTIAL/PLATELET
Basophils Absolute: 0 10*3/uL (ref 0–0.1)
Basophils Relative: 1 %
Eosinophils Absolute: 0 10*3/uL (ref 0–0.7)
Eosinophils Relative: 2 %
HCT: 31 % — ABNORMAL LOW (ref 35.0–47.0)
Hemoglobin: 10.6 g/dL — ABNORMAL LOW (ref 12.0–16.0)
Lymphocytes Relative: 23 %
Lymphs Abs: 0.6 10*3/uL — ABNORMAL LOW (ref 1.0–3.6)
MCH: 32.4 pg (ref 26.0–34.0)
MCHC: 34.3 g/dL (ref 32.0–36.0)
MCV: 94.4 fL (ref 80.0–100.0)
Monocytes Absolute: 0.4 10*3/uL (ref 0.2–0.9)
Monocytes Relative: 15 %
Neutro Abs: 1.6 10*3/uL (ref 1.4–6.5)
Neutrophils Relative %: 59 %
Platelets: 162 10*3/uL (ref 150–440)
RBC: 3.29 MIL/uL — ABNORMAL LOW (ref 3.80–5.20)
RDW: 17.2 % — ABNORMAL HIGH (ref 11.5–14.5)
WBC: 2.7 10*3/uL — ABNORMAL LOW (ref 3.6–11.0)

## 2017-12-08 LAB — COMPREHENSIVE METABOLIC PANEL
ALT: 33 U/L (ref 0–44)
AST: 25 U/L (ref 15–41)
Albumin: 4.1 g/dL (ref 3.5–5.0)
Alkaline Phosphatase: 58 U/L (ref 38–126)
Anion gap: 11 (ref 5–15)
BUN: 10 mg/dL (ref 8–23)
CO2: 25 mmol/L (ref 22–32)
Calcium: 9.1 mg/dL (ref 8.9–10.3)
Chloride: 101 mmol/L (ref 98–111)
Creatinine, Ser: 0.68 mg/dL (ref 0.44–1.00)
GFR calc Af Amer: >60 mL/min (ref >60)
GFR calc non Af Amer: >60 mL/min (ref >60)
Glucose, Bld: 109 mg/dL — ABNORMAL HIGH (ref 70–99)
Potassium: 3.7 mmol/L (ref 3.5–5.1)
Sodium: 137 mmol/L (ref 135–145)
Total Bilirubin: 0.7 mg/dL (ref 0.3–1.2)
Total Protein: 6.5 g/dL (ref 6.5–8.1)

## 2017-12-08 LAB — MAGNESIUM: Magnesium: 2 mg/dL (ref 1.7–2.4)

## 2017-12-08 MED ORDER — SODIUM CHLORIDE 0.9 % IV SOLN
80.0000 mg/m2 | Freq: Once | INTRAVENOUS | Status: AC
  Administered 2017-12-08: 150 mg via INTRAVENOUS
  Filled 2017-12-08: qty 25

## 2017-12-08 MED ORDER — HEPARIN SOD (PORK) LOCK FLUSH 100 UNIT/ML IV SOLN
500.0000 [IU] | Freq: Once | INTRAVENOUS | Status: AC | PRN
  Administered 2017-12-08: 500 [IU]
  Filled 2017-12-08: qty 5

## 2017-12-08 MED ORDER — FAMOTIDINE IN NACL 20-0.9 MG/50ML-% IV SOLN
20.0000 mg | Freq: Once | INTRAVENOUS | Status: AC
  Administered 2017-12-08: 20 mg via INTRAVENOUS
  Filled 2017-12-08: qty 50

## 2017-12-08 MED ORDER — DIPHENHYDRAMINE HCL 50 MG/ML IJ SOLN
50.0000 mg | Freq: Once | INTRAMUSCULAR | Status: AC
  Administered 2017-12-08: 50 mg via INTRAVENOUS
  Filled 2017-12-08: qty 1

## 2017-12-08 MED ORDER — SODIUM CHLORIDE 0.9 % IV SOLN
20.0000 mg | Freq: Once | INTRAVENOUS | Status: AC
  Administered 2017-12-08: 20 mg via INTRAVENOUS
  Filled 2017-12-08: qty 2

## 2017-12-08 MED ORDER — SODIUM CHLORIDE 0.9 % IV SOLN
Freq: Once | INTRAVENOUS | Status: AC
  Administered 2017-12-08: 10:00:00 via INTRAVENOUS
  Filled 2017-12-08: qty 1000

## 2017-12-08 NOTE — Progress Notes (Signed)
Overall feeling weak and tired. Intermittent pain r breast- lower region -jabbing sore pain. Few x per d.  Stated tingling and numbness in finger and feet- left sided worse. Pt stated.

## 2017-12-08 NOTE — Progress Notes (Signed)
[facility] day:  12/08/2017   Chief Complaint: Stephanie Sanders is a 72 y.o. female with multi-focal right breast cancer who is seen for assessment prior to week #9 Taxol.   HPI:  The patient was last seen in the medical oncology clinic on 12/01/2017.  At that time, she was doing well.  She described constipation after Welchol.  She had a "fleeting " neuropathy in her left hand and foot.  WBC was 2900 with an ANC of 2100.  She received week #8 Taxol.  She received GCSF on 12/02/2017.  CBC on 12/04/2017 revealed a hematocrit of 32.6, hemoglobin 10.9, platelets 155,000, WBC 8900 with an ANC of 7700.  During the interim, patient has been doing well following her last cycle. She is experiencing chronically loose stools. She is not on her Welchol as prescribed. She denies nausea, however she has a "queasy" feeling at time. She states, "I chew antiacids and it goes away". Neuropathy in her LEFT foot and hand has been "a little worse". Patient states, "It is almost like my fingers feel dry". She has occasional "shooting pains" and "spasms" in her LEFT lower extremity.    Patient denies that she has experienced any B symptoms. She denies any interval infections. Patient advises that she maintains an adequate appetite. She is eating well. Weight today is 140 lb 3.2 oz (63.6 kg), which compared to her last visit to the clinic, represents a 2 pound decrease.   Patient denies pain in the clinic today.   Past Medical History:  Diagnosis Date  . Cancer ([place])   . Diabetes mellitus without complication ([place])   . Diabetic nephropathy ([place])   . Headache    migraines  . Heart murmur    ASYMPTOMATIC  . Hypertension   . Hypothyroidism   . Personal history of chemotherapy 2019   right breast cancer  . Seborrheic keratosis   . Vitamin D deficiency     Past Surgical History:  Procedure Laterality Date  . APPENDECTOMY    . BREAST BIOPSY Right 05/28/2017    right breast bx 3 areas invasive mamm ca lymph node mets  . BREAST CYST ASPIRATION Bilateral    neg  . CHOLECYSTECTOMY    . COLONOSCOPY WITH PROPOFOL N/A 01/01/2015   Procedure: COLONOSCOPY WITH PROPOFOL;  Surgeon: Josefine Class, MD;  Location: [facility];  Service: Endoscopy;  Laterality: N/A;  . EYE SURGERY     eyelid  . PORTACATH PLACEMENT Right 06/10/2017   Procedure: INSERTION PORT-A-CATH;  Surgeon: Herbert Pun, MD;  Location: [facility] ORS;  Service: General;  Laterality: Right;  . STENT PLACEMENT ILIAC ([employer] HX)    . TUBAL LIGATION      Family History  Problem Relation Age of Onset  . Cancer Mother   . Cancer Maternal Aunt   . Breast cancer Neg Hx     Social History:  reports that she quit smoking about 45 years ago. Her smoking use included cigarettes. She has a 20.00 pack-year smoking history. She has never used smokeless tobacco. She reports that she does not drink alcohol or use drugs.  She has 3 children (daughters: age 52 and 14; son Jaquelyn Bitter): age 58).  She is retired.  She worked for the [employer].  Her husband has dementia.  She lives in [place].  She is alone today.  Allergies:  Allergies  Allergen Reactions  . Fiorinal [Butalbital-Aspirin-Caffeine] Nausea And Vomiting and Other (See Comments)  SEVERE HEADACHE  . Sulfa Antibiotics Other (See Comments)    Burning esophagus and stomach  . Tramadol Nausea And Vomiting  . Other Rash    STERI-STRIPS-RASH STERI-STRIPS-RASH    Current Medications: Current Outpatient Medications  Medication Sig Dispense Refill  . atorvastatin (LIPITOR) 10 MG tablet Take 10 mg by mouth every evening.     . Cholecalciferol 2000 UNITS CAPS Take 4,000 Units by mouth daily.     Marland Kitchen levothyroxine (SYNTHROID, LEVOTHROID) 50 MCG tablet Take 50 mcg by mouth daily before breakfast.    . lidocaine-prilocaine (EMLA) cream Apply 1 application topically as needed. 30 g 6  . loratadine (CLARITIN) 10 MG tablet Take 10 mg by mouth daily.     Marland Kitchen LORazepam (ATIVAN) 0.5 MG tablet Take 1 tablet (0.5 mg total) by mouth every 6 (six) hours as needed (Nausea or vomiting). 30 tablet 0  . MAGNESIUM GLYCINATE PLUS PO Take 100 mg by mouth.    . Melatonin 5 MG TABS Take 5 mg by mouth at bedtime as needed (for sleep.).    Marland Kitchen metFORMIN (GLUCOPHAGE) 1000 MG tablet Take 1,000 mg by mouth 2 (two) times daily.    . quinapril (ACCUPRIL) 5 MG tablet Take 5 mg by mouth every morning.     . sodium bicarbonate 650 MG tablet Take 1,300 mg by mouth at bedtime.    Marland Kitchen acetaminophen (TYLENOL) 500 MG tablet Take 1,000 mg by mouth every 6 (six) hours as needed (for headaches.).    Marland Kitchen aspirin-acetaminophen-caffeine (EXCEDRIN MIGRAINE) 250-250-65 MG tablet Take 2 tablets by mouth every 6 (six) hours as needed for headache.    . Blood Glucose Monitoring Suppl (FIFTY50 GLUCOSE METER 2.0) w/Device KIT One Touch Ultra Meter Use as instructed.    . colesevelam (WELCHOL) 625 MG tablet Take 1,875 mg by mouth every evening.     Marland Kitchen dexamethasone (DECADRON) 4 MG tablet Take 2 tablets by mouth once a day on the day after chemotherapy and then take 2 tablets two times a day for 2 days. Take with food. (Patient not taking: Reported on 12/08/2017) 30 tablet 1  . nystatin (MYCOSTATIN) 100000 UNIT/ML suspension Take 5 mLs (500,000 Units total) by mouth 4 (four) times daily. (Patient not taking: Reported on 12/08/2017) 240 mL 0  . ondansetron (ZOFRAN) 8 MG tablet Take 1 tablet (8 mg total) by mouth 2 (two) times daily as needed. Start on the third day after chemotherapy. (Patient not taking: Reported on 12/08/2017) 30 tablet 1  . polyethylene glycol (MIRALAX / GLYCOLAX) packet Take 17 g by mouth daily.     No current facility-administered medications for this visit.    Facility-Administered Medications Ordered in Other Visits  Medication Dose Route Frequency Provider Last Rate Last Dose  . sodium chloride flush (NS) 0.9 % injection 10 mL  10 mL Intravenous PRN Lequita Asal, MD    10 mL at 10/21/17 0841    Review of Systems  Constitutional: Positive for malaise/fatigue and weight loss (2 pounds). Negative for diaphoresis and fever.       Feels "pretty good".  HENT: Negative.  Negative for congestion, ear discharge, ear pain, nosebleeds, sinus pain, sore throat and tinnitus.        Dysgeusia  Eyes: Negative.  Negative for blurred vision, double vision, photophobia, pain, discharge and redness.  Respiratory: Negative.  Negative for cough, hemoptysis, sputum production and shortness of breath.   Cardiovascular: Negative.  Negative for chest pain, palpitations, orthopnea, leg swelling and PND.  Gastrointestinal:  Positive for nausea (queasy, intermittent). Negative for abdominal pain, blood in stool, constipation, diarrhea, melena and vomiting.       Chronic loose stools  Genitourinary: Negative.  Negative for dysuria, frequency, hematuria and urgency.  Musculoskeletal: Negative.  Negative for back pain, falls, joint pain and myalgias.  Skin: Negative for itching and rash.  Neurological: Positive for sensory change (neuropathy in LEFT hand and foot). Negative for dizziness, tremors, weakness and headaches.  Endo/Heme/Allergies: Does not bruise/bleed easily.       Diabetes - on Metformin  Psychiatric/Behavioral: Negative for depression, memory loss and suicidal ideas. The patient is not nervous/anxious and does not have insomnia.   All other systems reviewed and are negative.  Performance status (ECOG): 1 - Symptomatic but completely ambulatory  Vital signs BP 138/74 (BP Location: Right Arm, Patient Position: Sitting)   Pulse 73   Temp 98.3 F (36.8 C) (Tympanic)   Resp 18   Wt 140 lb 3.2 oz (63.6 kg)   BMI 21.32 kg/m   Physical Exam  Constitutional: She is oriented to person, place, and time and well-developed, well-nourished, and in no distress.  HENT:  Head: Normocephalic and atraumatic.  Wearing a blue wrap.  Eyes: Pupils are equal, round, and reactive to  light. Conjunctivae and EOM are normal. No scleral icterus.  Glasses.  Blue eyes.  Neck: Normal range of motion. Neck supple. No JVD present.  Cardiovascular: Normal rate, regular rhythm and normal heart sounds. Exam reveals no gallop and no friction rub.  No murmur heard. Pulmonary/Chest: Effort normal and breath sounds normal. No respiratory distress. She has no wheezes. She has no rales.  Abdominal: Soft. Bowel sounds are normal. She exhibits no distension and no mass. There is no tenderness. There is no rebound and no guarding.  Musculoskeletal: Normal range of motion. She exhibits no edema or tenderness.  Lymphadenopathy:    She has no cervical adenopathy.    She has no axillary adenopathy.       Right: No inguinal and no supraclavicular adenopathy present.       Left: No inguinal and no supraclavicular adenopathy present.  Neurological: She is alert and oriented to person, place, and time.  Skin: Skin is warm and dry. No rash noted. No erythema.  Psychiatric: Mood, affect and judgment normal.  Nursing note and vitals reviewed.   Appointment on 12/08/2017  Component Date Value Ref Range Status  . Magnesium 12/08/2017 2.0  1.7 - 2.4 mg/dL Final   Performed at [facility], [address], [place], [place] [address]  . Sodium 12/08/2017 137  135 - 145 mmol/L Final  . Potassium 12/08/2017 3.7  3.5 - 5.1 mmol/L Final  . Chloride 12/08/2017 101  98 - 111 mmol/L Final  . CO2 12/08/2017 25  22 - 32 mmol/L Final  . Glucose, Bld 12/08/2017 109* 70 - 99 mg/dL Final  . BUN 12/08/2017 10  8 - 23 mg/dL Final  . Creatinine, Ser 12/08/2017 0.68  0.44 - 1.00 mg/dL Final  . Calcium 12/08/2017 9.1  8.9 - 10.3 mg/dL Final  . Total Protein 12/08/2017 6.5  6.5 - 8.1 g/dL Final  . Albumin 12/08/2017 4.1  3.5 - 5.0 g/dL Final  . AST 12/08/2017 25  15 - 41 U/L Final  . ALT 12/08/2017 33  0 - 44 U/L Final  . Alkaline Phosphatase 12/08/2017 58  38 - 126 U/L Final  . Total Bilirubin 12/08/2017  0.7  0.3 - 1.2 mg/dL Final  . GFR  calc non Af Amer 12/08/2017 >60  >60 mL/min Final  . GFR calc Af Amer 12/08/2017 >60  >60 mL/min Final   Comment: (NOTE) The eGFR has been calculated using the CKD EPI equation. This calculation has not been validated in all clinical situations. eGFR's persistently <60 mL/min signify possible Chronic Kidney Disease.   Georgiann Hahn gap 12/08/2017 11  5 - 15 Final   Performed at [facility], [address] [place], [place] [address]  . WBC 12/08/2017 2.7* 3.6 - 11.0 K/uL Final  . RBC 12/08/2017 3.29* 3.80 - 5.20 MIL/uL Final  . Hemoglobin 12/08/2017 10.6* 12.0 - 16.0 g/dL Final  . HCT 12/08/2017 31.0* 35.0 - 47.0 % Final  . MCV 12/08/2017 94.4  80.0 - 100.0 fL Final  . MCH 12/08/2017 32.4  26.0 - 34.0 pg Final  . MCHC 12/08/2017 34.3  32.0 - 36.0 g/dL Final  . RDW 12/08/2017 17.2* 11.5 - 14.5 % Final  . Platelets 12/08/2017 162  150 - 440 K/uL Final  . Neutrophils Relative % 12/08/2017 59  % Final  . Neutro Abs 12/08/2017 1.6  1.4 - 6.5 K/uL Final  . Lymphocytes Relative 12/08/2017 23  % Final  . Lymphs Abs 12/08/2017 0.6* 1.0 - 3.6 K/uL Final  . Monocytes Relative 12/08/2017 15  % Final  . Monocytes Absolute 12/08/2017 0.4  0.2 - 0.9 K/uL Final  . Eosinophils Relative 12/08/2017 2  % Final  . Eosinophils Absolute 12/08/2017 0.0  0 - 0.7 K/uL Final  . Basophils Relative 12/08/2017 1  % Final  . Basophils Absolute 12/08/2017 0.0  0 - 0.1 K/uL Final   Performed at [facility], [address] [place], [place] [address]    Assessment:  Stephanie Sanders is a 72 y.o. 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  Pathology revealed two grade II invasive mammary carcinomas of no special type (4 cm from the nipple and 5 cm from the nipple).  Tumors were in close proximity.  Lymph node biopsy confirmed metastatic carcinoma of breast origin.  Tumor is ER negative (< 1%), PR negative and  Her 2/neu 2+ by IHC.  [facility] genetic testing on 07/21/2017 revealed a variant of uncertain significance in RECQL4.  Right sided mammogram and ultrasound on 05/21/2017 revealed 2 highly suspicious masses in the right breast at the 10:30 position 4 cm from nipple (1.8 x 1.2 x 1.1 cm) and at the 10:30 position 5 cm from nipple (1.1 x 0.9 x 0.9 cm).  There was a suspicious 2.7 x 1 x 2.2 cm lymph node in the right axilla with asymmetric nodular cortical thickening.  Ultrasound guided biopsy of the 2 masses and lymph node on 05/28/2017 revealed grade II invasive mammary carcinoma of no special type (4 cm from the nipple and 5 cm from the nipple).  Lymph node biopsy revealed metastatic carcinoma of breast origin.  Tumor is ER negative (< 1%), PR negative and Her 2/neu 2+ by IHC.  FISH was negative.  CA 27.29 was 16.5 and CA15-3 was 17.6 on 06/04/2017.  Echo on 06/09/2017 revealed an EF of 60-65%.  She has enrolled on the UPBEAT clinical trial.  She received 4 cycles of AC (06/23/2017 - 09/08/2017) with Udencya support.  Cycle #1 was complicated by influenza A.  Nadir counts included an ANC of 0 and platelet count of 71,000. Cycle #2 postponed due to slow recovery from influenza and excisional cyst removal with surrounding  cellulitis (treated with 10 days of doxycycline).   She is s/p week #8 Taxol (09/29/2017 - 11/10/2017; 11/24/2017 - 12/01/2017).  She received GCSF after week #3 and 5 Taxol secondary to borderline counts. Cycle #5 was postponed on 10/29/2017 due to patient's wishes to be treated on Tuesdays only. Week #7 Taxol was held secondary to neutropenia (ANC 200).  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  Normal labs on 11/24/2017 included:  B12, folate, TSH, and copper.  Right mammogram and ultrasound on 09/28/2017 revealed the 2 adjacent masses in the right breast at the 10:30 o'clock position had both decreased in size (1.8 x 1.1 x 1.2 cm to 1.2 x 0.7 x 1.1 cm; 11 x 9 x 9 mm to 7 x 6 x 6  mm) as well as the metastatic right axillary lymph node (2 .7 cm to 1.6 x 0.9 x 1.7 cm).    She was [facility] to [facility] from 06/28/2017 - 07/01/2017 with with fever, cough and generalized weakness.  Nasal swab was + for influenza A.  She completed a course of Tamiflu.  Symptomatically, she feels "queasy".  She has a little more neuropathy.  Exam is stable. WBC 2700 (ANC 1600).  Magnesium is 2.0.  Plan: 1. Labs today:  CBC with diff, CMP, Mg. 2. Breast cancer - treatment ongoing  Doing well overall. Tolerating treatments with minimal side effects.  Labs reviewed. Blood counts stable and adequate enough for treatment. Will proceed with cycle #9 Taxol today.  Discuss symptom management.  Patient has antiemetics and pain medications at home to use on a PRN basis. Patient  advising that the  prescribed interventions are adequate at this point. Continue all medications as previously prescribed.  3. 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  Will have patient RTC the day following treatments for GCSF support.  4. Chemotherapy induced neuropathy - stable  Persistent symptoms in LEFT hand and foot only. Stable and not limiting day to day function.   Will continue to monitor.  5. Protein calorie malnutrition - ongoing monitoring  Patient continues to lose weight. Her weight today is 140 lb 3.2 oz (63.6 kg). Her BMI of 21.32 kg/m places her in the normal weight category.   Encouraged her to increase her intake of calorie and protein dense food choices.   Additionally, patient was encouraged to utilize nutritional supplement shakes at least 2-3 times a day.  6. RTC on 12/09/2017 for GCSF. 7. RTC on 12/11/2017 for labs (CBC with diff) and +/- GCSF 8. RTC on 12/15/2017 for MD assessment, labs (CBC with diff, CMP, Mg) and week #10 Taxol 9. RTC on 12/16/2017 for GCSF. 10. RTC on 12/22/2017  for MD assessment, labs (CBC with diff, CMP, Mg), and week #11 Taxol. 11. RTC on 12/23/2017 for GCSF.   Honor Loh, NP 12/08/2017,9:41 AM   I saw and evaluated the patient, participating in the key portions of the service and reviewing pertinent diagnostic studies and records.  I reviewed the nurse practitioner's note and agree with the findings and the plan.  The assessment and plan were discussed with the patient.  Multiple questions were asked by the patient and answered.   Nolon Stalls, MD 12/08/2017,9:41 AM

## 2017-12-09 ENCOUNTER — Inpatient Hospital Stay: Payer: Medicare Other

## 2017-12-09 DIAGNOSIS — Z5111 Encounter for antineoplastic chemotherapy: Secondary | ICD-10-CM | POA: Diagnosis not present

## 2017-12-09 DIAGNOSIS — R110 Nausea: Secondary | ICD-10-CM

## 2017-12-09 DIAGNOSIS — T451X5A Adverse effect of antineoplastic and immunosuppressive drugs, initial encounter: Principal | ICD-10-CM

## 2017-12-09 MED ORDER — TBO-FILGRASTIM 480 MCG/0.8ML ~~LOC~~ SOSY
480.0000 ug | PREFILLED_SYRINGE | Freq: Once | SUBCUTANEOUS | Status: AC
  Administered 2017-12-09: 480 ug via SUBCUTANEOUS

## 2017-12-11 ENCOUNTER — Inpatient Hospital Stay: Payer: Medicare Other

## 2017-12-11 ENCOUNTER — Other Ambulatory Visit: Payer: Self-pay

## 2017-12-11 ENCOUNTER — Other Ambulatory Visit: Payer: Self-pay | Admitting: Hematology and Oncology

## 2017-12-11 ENCOUNTER — Telehealth: Payer: Self-pay | Admitting: *Deleted

## 2017-12-11 DIAGNOSIS — Z5111 Encounter for antineoplastic chemotherapy: Secondary | ICD-10-CM | POA: Diagnosis not present

## 2017-12-11 DIAGNOSIS — R197 Diarrhea, unspecified: Secondary | ICD-10-CM

## 2017-12-11 DIAGNOSIS — Z171 Estrogen receptor negative status [ER-]: Principal | ICD-10-CM

## 2017-12-11 DIAGNOSIS — C50911 Malignant neoplasm of unspecified site of right female breast: Secondary | ICD-10-CM

## 2017-12-11 LAB — COMPREHENSIVE METABOLIC PANEL
ALT: 34 U/L (ref 0–44)
AST: 26 U/L (ref 15–41)
Albumin: 4.2 g/dL (ref 3.5–5.0)
Alkaline Phosphatase: 69 U/L (ref 38–126)
Anion gap: 7 (ref 5–15)
BUN: 10 mg/dL (ref 8–23)
CO2: 26 mmol/L (ref 22–32)
Calcium: 9 mg/dL (ref 8.9–10.3)
Chloride: 104 mmol/L (ref 98–111)
Creatinine, Ser: 0.81 mg/dL (ref 0.44–1.00)
GFR calc Af Amer: >60 mL/min (ref >60)
GFR calc non Af Amer: >60 mL/min (ref >60)
Glucose, Bld: 252 mg/dL — ABNORMAL HIGH (ref 70–99)
Potassium: 3.7 mmol/L (ref 3.5–5.1)
Sodium: 137 mmol/L (ref 135–145)
Total Bilirubin: 0.7 mg/dL (ref 0.3–1.2)
Total Protein: 6.5 g/dL (ref 6.5–8.1)

## 2017-12-11 LAB — CBC WITH DIFFERENTIAL/PLATELET
Basophils Absolute: 0 10*3/uL (ref 0–0.1)
Basophils Relative: 0 %
Eosinophils Absolute: 0 10*3/uL (ref 0–0.7)
Eosinophils Relative: 0 %
HCT: 32.4 % — ABNORMAL LOW (ref 35.0–47.0)
Hemoglobin: 10.6 g/dL — ABNORMAL LOW (ref 12.0–16.0)
Lymphocytes Relative: 5 %
Lymphs Abs: 0.7 10*3/uL — ABNORMAL LOW (ref 1.0–3.6)
MCH: 31.5 pg (ref 26.0–34.0)
MCHC: 32.9 g/dL (ref 32.0–36.0)
MCV: 95.7 fL (ref 80.0–100.0)
Monocytes Absolute: 0.2 10*3/uL (ref 0.2–0.9)
Monocytes Relative: 1 %
Neutro Abs: 13.6 10*3/uL — ABNORMAL HIGH (ref 1.4–6.5)
Neutrophils Relative %: 94 %
Platelets: 172 10*3/uL (ref 150–440)
RBC: 3.38 MIL/uL — ABNORMAL LOW (ref 3.80–5.20)
RDW: 17.4 % — ABNORMAL HIGH (ref 11.5–14.5)
WBC: 14.5 10*3/uL — ABNORMAL HIGH (ref 3.6–11.0)

## 2017-12-11 LAB — MAGNESIUM: Magnesium: 2 mg/dL (ref 1.7–2.4)

## 2017-12-11 NOTE — Addendum Note (Signed)
Addended by: Betti Cruz on: 12/11/2017 03:53 PM   Modules accepted: Orders

## 2017-12-11 NOTE — Telephone Encounter (Signed)
Patient called tp report that she is having loose stools 4 - 5 times a day for past few days, she has not taken anything for it nor has she adjusted her diet. I recommended that she use Imodium AD and try the BRAT diet for a couple of days and to continue to drink Gatorade. She was instructed to call back if these recommendations do not help. Patient in agreement with this plan

## 2017-12-11 NOTE — Telephone Encounter (Signed)
Patient notified and will come in at 4 pm for lab, please keep check for results sand notify patient when ready and any further instructions

## 2017-12-11 NOTE — Telephone Encounter (Signed)
There are a great deal of our patient's with infectious diarrhea. I would not have her use antidiarrheals until with confirm that her diarrhea is not of an infectious etiology. She needs a GI panel and C.diff PCR. Encourage increased fluid intake, especially electrolyte containing fluids (Pedialyte and/or Gatorade).   Honor Loh, MSN, APRN, FNP-C, CEN Oncology/Hematology Nurse Practitioner  [facility] 12/11/17, 3:14 PM

## 2017-12-14 ENCOUNTER — Other Ambulatory Visit: Payer: Self-pay | Admitting: *Deleted

## 2017-12-14 DIAGNOSIS — C50911 Malignant neoplasm of unspecified site of right female breast: Secondary | ICD-10-CM

## 2017-12-14 DIAGNOSIS — Z171 Estrogen receptor negative status [ER-]: Principal | ICD-10-CM

## 2017-12-14 NOTE — Progress Notes (Signed)
[facility] day:  12/15/2017   Chief Complaint: Stephanie Sanders is a 72 y.o. female with multi-focal right breast cancer who is seen for assessment prior to week #10 Taxol.   HPI:  The patient was last seen in the medical oncology clinic on 12/08/2017.  At that time, patient had been doing well following her last cycle of chemotherapy.  She complained of chronically loose stools.  Not taking her Welchol as prescribed.  Patient described having a "queasy" feeling at times, however denies vomiting.  Patient stated "I chew antiacids and it goes away".  Patient complained of the neuropathy in her left foot and hand being "a little worse".  Appetite stable; weight down 2 pounds.  Exam grossly unremarkable.  WBC 2700 (ANC 1600).  She received week #9 paclitaxel.  She received GCSF on 12/09/2017.  Repeat labs done 12/11/2017 revealed a WBC 14,500 ([place] 13,600).  Hemoglobin 10.6, hematocrit 32.4, MCV 95.7, and platelets 172,000.  Glucose elevated to 252.  Electrolytes all normal. BUN 10 and creatinine 0.81.  Patient contacted the office on the afternoon of 12/11/2017 with reports of increased loose stools (5-7 episodes/day) over the last few days.  Patient was not using OTC antidiarrheals.  He denied recent changes to her diet.  Patient was asked to increase her fluid intake.  Additionally, patient was asked to present to the clinic to provide a stool specimen to rule out diarrhea of infectious etiology.  Orders were placed, however patient never came in to provide the sample.  In the interim, patient has been doing well. She continues to have loose stools. She denies diarrhea at this point. Patient states, "my stool have a strong chemical odor. It does not smell foul". She has been maintaining a [employer]. Patient attributes symptoms to getting "that shot". Patient has avoided antidiarrheals.   Patient denies that she has experienced any B symptoms. She denies any  interval infections. Patient advises that she maintains an adequate appetite. Sheis eating well. Weight today is 138 lb 11.2 oz (62.9 kg), which compared to her last visit to the clinic, represents a 2 pound decrease.    Patient denies pain in the clinic today.   Past Medical History:  Diagnosis Date  . Cancer ([place])   . Diabetes mellitus without complication ([place])   . Diabetic nephropathy ([place])   . Headache    migraines  . Heart murmur    ASYMPTOMATIC  . Hypertension   . Hypothyroidism   . Personal history of chemotherapy 2019   right breast cancer  . Seborrheic keratosis   . Vitamin D deficiency     Past Surgical History:  Procedure Laterality Date  . APPENDECTOMY    . BREAST BIOPSY Right 05/28/2017   right breast bx 3 areas invasive mamm ca lymph node mets  . BREAST CYST ASPIRATION Bilateral    neg  . CHOLECYSTECTOMY    . COLONOSCOPY WITH PROPOFOL N/A 01/01/2015   Procedure: COLONOSCOPY WITH PROPOFOL;  Surgeon: Josefine Class, MD;  Location: [facility] ENDOSCOPY;  Service: Endoscopy;  Laterality: N/A;  . EYE SURGERY     eyelid  . PORTACATH PLACEMENT Right 06/10/2017   Procedure: INSERTION PORT-A-CATH;  Surgeon: Herbert Pun, MD;  Location: [facility] ORS;  Service: General;  Laterality: Right;  . STENT PLACEMENT ILIAC ([place] HX)    . TUBAL LIGATION      Family History  Problem Relation Age of Onset  . Cancer Mother   .  Cancer Maternal Aunt   . Breast cancer Neg Hx     Social History:  reports that she quit smoking about 45 years ago. Her smoking use included cigarettes. She has a 20.00 pack-year smoking history. She has never used smokeless tobacco. She reports that she does not drink alcohol or use drugs.  She has 3 children (daughters: age 52 and 1; son Jaquelyn Bitter): age 92).  She is retired.  She worked for the [employer].  Her husband has dementia.  She lives in [place].  She is alone today.  Allergies:  Allergies  Allergen Reactions  . Fiorinal  [Butalbital-Aspirin-Caffeine] Nausea And Vomiting and Other (See Comments)    SEVERE HEADACHE  . Sulfa Antibiotics Other (See Comments)    Burning esophagus and stomach  . Tramadol Nausea And Vomiting  . Other Rash    STERI-STRIPS-RASH STERI-STRIPS-RASH    Current Medications: Current Outpatient Medications  Medication Sig Dispense Refill  . atorvastatin (LIPITOR) 10 MG tablet Take 10 mg by mouth every evening.     . Blood Glucose Monitoring Suppl (FIFTY50 GLUCOSE METER 2.0) w/Device KIT One Touch Ultra Meter Use as instructed.    . Cholecalciferol 2000 UNITS CAPS Take 4,000 Units by mouth daily.     Marland Kitchen levothyroxine (SYNTHROID, LEVOTHROID) 50 MCG tablet Take 50 mcg by mouth daily before breakfast.    . lidocaine-prilocaine (EMLA) cream Apply 1 application topically as needed. 30 g 6  . loratadine (CLARITIN) 10 MG tablet Take 10 mg by mouth daily.    Marland Kitchen LORazepam (ATIVAN) 0.5 MG tablet Take 1 tablet (0.5 mg total) by mouth every 6 (six) hours as needed (Nausea or vomiting). 30 tablet 0  . MAGNESIUM GLYCINATE PLUS PO Take 100 mg by mouth.    . Melatonin 5 MG TABS Take 5 mg by mouth at bedtime as needed (for sleep.).    Marland Kitchen metFORMIN (GLUCOPHAGE) 1000 MG tablet Take 1,000 mg by mouth 2 (two) times daily.    . quinapril (ACCUPRIL) 5 MG tablet Take 5 mg by mouth every morning.     . sodium bicarbonate 650 MG tablet Take 1,300 mg by mouth at bedtime.    Marland Kitchen acetaminophen (TYLENOL) 500 MG tablet Take 1,000 mg by mouth every 6 (six) hours as needed (for headaches.).    Marland Kitchen aspirin-acetaminophen-caffeine (EXCEDRIN MIGRAINE) 250-250-65 MG tablet Take 2 tablets by mouth every 6 (six) hours as needed for headache.    . colesevelam (WELCHOL) 625 MG tablet Take 1,875 mg by mouth every evening.     Marland Kitchen dexamethasone (DECADRON) 4 MG tablet Take 2 tablets by mouth once a day on the day after chemotherapy and then take 2 tablets two times a day for 2 days. Take with food. (Patient not taking: Reported on 12/15/2017)  30 tablet 1  . nystatin (MYCOSTATIN) 100000 UNIT/ML suspension Take 5 mLs (500,000 Units total) by mouth 4 (four) times daily. (Patient not taking: Reported on 12/15/2017) 240 mL 0  . ondansetron (ZOFRAN) 8 MG tablet Take 1 tablet (8 mg total) by mouth 2 (two) times daily as needed. Start on the third day after chemotherapy. (Patient not taking: Reported on 12/15/2017) 30 tablet 1  . polyethylene glycol (MIRALAX / GLYCOLAX) packet Take 17 g by mouth daily.     No current facility-administered medications for this visit.    Facility-Administered Medications Ordered in Other Visits  Medication Dose Route Frequency Provider Last Rate Last Dose  . heparin lock flush 100 unit/mL  500 Units Intravenous Once Nolon Stalls  C, MD      . sodium chloride flush (NS) 0.9 % injection 10 mL  10 mL Intravenous PRN Lequita Asal, MD   10 mL at 10/21/17 0841  . sodium chloride flush (NS) 0.9 % injection 10 mL  10 mL Intravenous PRN Lequita Asal, MD   10 mL at 12/15/17 0818    Review of Systems  Constitutional: Positive for malaise/fatigue and weight loss (2 pounds). Negative for chills, diaphoresis and fever.       Feels weak.  HENT: Negative.  Negative for congestion, ear discharge, ear pain, nosebleeds, sinus pain, sore throat and tinnitus.        Dysgeusia  Eyes: Negative.  Negative for blurred vision, double vision, pain and discharge.  Respiratory: Negative.  Negative for cough, hemoptysis, sputum production and shortness of breath.   Cardiovascular: Negative.  Negative for chest pain, palpitations, orthopnea, leg swelling and PND.  Gastrointestinal: Positive for diarrhea (increased) and nausea (controlled). Negative for abdominal pain, blood in stool, constipation, melena and vomiting.       Increase loose stools.  Genitourinary: Negative.  Negative for dysuria, frequency, hematuria and urgency.  Musculoskeletal: Negative.  Negative for back pain, falls, joint pain, myalgias and neck  pain.  Skin: Negative.  Negative for itching and rash.  Neurological: Positive for sensory change (neuropathy-stable). Negative for dizziness, tingling, tremors, speech change, focal weakness, weakness and headaches.  Endo/Heme/Allergies: Does not bruise/bleed easily.       Diabetes - on Metformin  Psychiatric/Behavioral: Negative for depression and memory loss. The patient is not nervous/anxious and does not have insomnia.   All other systems reviewed and are negative.  Performance status (ECOG): 1 - Symptomatic but completely ambulatory  Vital signs BP 135/71 (BP Location: Right Arm, Patient Position: Sitting)   Pulse 88   Temp (!) 96.1 F (35.6 C) (Tympanic)   Resp 18   Wt 138 lb 11.2 oz (62.9 kg)   BMI 21.09 kg/m   Physical Exam  Constitutional: She is oriented to person, place, and time and well-developed, well-nourished, and in no distress.  Fatigued appearing.  HENT:  Head: Normocephalic and atraumatic.  Wearing a scarf.  Eyes: Pupils are equal, round, and reactive to light. Conjunctivae and EOM are normal. No scleral icterus.  Glasses.  Blue eyes.   Neck: Normal range of motion. Neck supple. No JVD present.  Cardiovascular: Normal rate. Exam reveals no gallop and no friction rub.  No murmur heard. Pulmonary/Chest: Effort normal and breath sounds normal. No respiratory distress. She has no wheezes. She has no rales.  Abdominal: Soft. Bowel sounds are normal. She exhibits no distension and no mass. There is no tenderness. There is no rebound and no guarding.  Musculoskeletal: Normal range of motion. She exhibits no edema or tenderness.  Lymphadenopathy:    She has no cervical adenopathy.    She has no axillary adenopathy.       Right: No inguinal and no supraclavicular adenopathy present.       Left: No inguinal and no supraclavicular adenopathy present.  Neurological: She is alert and oriented to person, place, and time.  Skin: Skin is warm and dry. No rash noted. No  erythema.  Psychiatric: Mood, affect and judgment normal.  Nursing note and vitals reviewed.   Infusion on 12/15/2017  Component Date Value Ref Range Status  . Magnesium 12/15/2017 1.9  1.7 - 2.4 mg/dL Final   Performed at [facility], [address], [place], [place] [address]  .  Sodium 12/15/2017 137  135 - 145 mmol/L Final  . Potassium 12/15/2017 3.4* 3.5 - 5.1 mmol/L Final  . Chloride 12/15/2017 103  98 - 111 mmol/L Final  . CO2 12/15/2017 24  22 - 32 mmol/L Final  . Glucose, Bld 12/15/2017 174* 70 - 99 mg/dL Final  . BUN 12/15/2017 13  8 - 23 mg/dL Final  . Creatinine, Ser 12/15/2017 0.75  0.44 - 1.00 mg/dL Final  . Calcium 12/15/2017 9.2  8.9 - 10.3 mg/dL Final  . Total Protein 12/15/2017 6.3* 6.5 - 8.1 g/dL Final  . Albumin 12/15/2017 3.9  3.5 - 5.0 g/dL Final  . AST 12/15/2017 34  15 - 41 U/L Final  . ALT 12/15/2017 37  0 - 44 U/L Final  . Alkaline Phosphatase 12/15/2017 63  38 - 126 U/L Final  . Total Bilirubin 12/15/2017 0.7  0.3 - 1.2 mg/dL Final  . GFR calc non Af Amer 12/15/2017 >60  >60 mL/min Final  . GFR calc Af Amer 12/15/2017 >60  >60 mL/min Final   Comment: (NOTE) The eGFR has been calculated using the CKD EPI equation. This calculation has not been validated in all clinical situations. eGFR's persistently <60 mL/min signify possible Chronic Kidney Disease.   Georgiann Hahn gap 12/15/2017 10  5 - 15 Final   Performed at [facility], [address] [place], [place] [address]  . WBC 12/15/2017 3.8  3.6 - 11.0 K/uL Final  . RBC 12/15/2017 3.20* 3.80 - 5.20 MIL/uL Final  . Hemoglobin 12/15/2017 10.4* 12.0 - 16.0 g/dL Final  . HCT 12/15/2017 30.4* 35.0 - 47.0 % Final  . MCV 12/15/2017 95.0  80.0 - 100.0 fL Final  . MCH 12/15/2017 32.5  26.0 - 34.0 pg Final  . MCHC 12/15/2017 34.2  32.0 - 36.0 g/dL Final  . RDW 12/15/2017 17.3* 11.5 - 14.5 % Final  . Platelets 12/15/2017 181  150 - 440 K/uL Final  . Neutrophils Relative % 12/15/2017 72  % Final  .  Neutro Abs 12/15/2017 2.7  1.4 - 6.5 K/uL Final  . Lymphocytes Relative 12/15/2017 16  % Final  . Lymphs Abs 12/15/2017 0.6* 1.0 - 3.6 K/uL Final  . Monocytes Relative 12/15/2017 10  % Final  . Monocytes Absolute 12/15/2017 0.4  0.2 - 0.9 K/uL Final  . Eosinophils Relative 12/15/2017 1  % Final  . Eosinophils Absolute 12/15/2017 0.0  0 - 0.7 K/uL Final  . Basophils Relative 12/15/2017 1  % Final  . Basophils Absolute 12/15/2017 0.0  0 - 0.1 K/uL Final   Performed at [facility], [address] [place], [place] [address]    Assessment:  Stephanie Sanders is a 72 y.o. 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  Pathology revealed two grade II invasive mammary carcinomas of no special type (4 cm from the nipple and 5 cm from the nipple).  Tumors were in close proximity.  Lymph node biopsy confirmed metastatic carcinoma of breast origin.  Tumor is ER negative (< 1%), PR negative and Her 2/neu 2+ by IHC.  [facility] genetic testing on 07/21/2017 revealed a variant of uncertain significance in RECQL4.  Right sided mammogram and ultrasound on 05/21/2017 revealed 2 highly suspicious masses in the right breast at the 10:30 position 4 cm from nipple (1.8 x 1.2 x 1.1 cm) and at the 10:30 position 5 cm from nipple (1.1 x 0.9 x 0.9 cm).  There was a suspicious  2.7 x 1 x 2.2 cm lymph node in the right axilla with asymmetric nodular cortical thickening.  Ultrasound guided biopsy of the 2 masses and lymph node on 05/28/2017 revealed grade II invasive mammary carcinoma of no special type (4 cm from the nipple and 5 cm from the nipple).  Lymph node biopsy revealed metastatic carcinoma of breast origin.  Tumor is ER negative (< 1%), PR negative and Her 2/neu 2+ by IHC.  FISH was negative.  CA 27.29 was 16.5 and CA15-3 was 17.6 on 06/04/2017.  Echo on 06/09/2017 revealed an EF of 60-65%.  She has enrolled on the UPBEAT clinical trial.  She  received 4 cycles of AC (06/23/2017 - 09/08/2017) with Udencya support.  Cycle #1 was complicated by influenza A.  Nadir counts included an ANC of 0 and platelet count of 71,000. Cycle #2 postponed due to slow recovery from influenza and excisional cyst removal with surrounding cellulitis (treated with 10 days of doxycycline).   She is s/p week #9 Taxol (09/29/2017 - 11/10/2017; 11/24/2017 - 12/08/2017).  She received GCSF after week #3 and 5 Taxol secondary to borderline counts. Cycle #5 was postponed on 10/29/2017 due to patient's wishes to be treated on Tuesdays only. Week #7 Taxol was held secondary to neutropenia (ANC 200).  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  Normal labs on 11/24/2017 included:  B12, folate, TSH, and copper.  Right mammogram and ultrasound on 09/28/2017 revealed the 2 adjacent masses in the right breast at the 10:30 o'clock position had both decreased in size (1.8 x 1.1 x 1.2 cm to 1.2 x 0.7 x 1.1 cm; 11 x 9 x 9 mm to 7 x 6 x 6 mm) as well as the metastatic right axillary lymph node (2 .7 cm to 1.6 x 0.9 x 1.7 cm).    She was admitted to [facility] from 06/28/2017 - 07/01/2017 with with fever, cough and generalized weakness.  Nasal swab was + for influenza A.  She completed a course of Tamiflu.  Symptomatically, she feels weak today.  Loose stools have increased in frequency.  Exam is unremarkable.  Plan: 1. Labs today: CBC with differential, CMP, magnesium 2. Breast cancer - treatment ongoing Hold cycle #10 Taxol today secondary to diarrhea.  3. 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  WBC 3800 ([place] 2700).   Hold GCSF this week as patient not receiving chemotherapy. 4. Chemotherapy induced neuropathy - stable  Persistent symptoms in the LEFT hand and foot only.    Stable and not limiting day-to-day function.  We will continue to monitor. 5. Diarrhea- increased  Stool for C diff and GI panel by PCR.  Discuss BRAT diet.  Discuss  supportive management. 6. HYPOkalemia - mild  Potassium 3.4.  Discuss oral supplement. 7. Protein calorie nutrition - ongoing monitoring  Patient continues to lose weight. Her weight today is 138 lb 11.2 oz (62.9 kg). Her BMI of 21.09 kg/m places her in the normal weight category.   Encouraged her to increase her intake of calorie and protein dense food choices.   Patient was encouraged to utilize nutritional supplement shakes at least 2-3 times a day.  8. RTC on 12/22/2017 for MD assessment, labs (CBC with differential, CMP, magnesium), and week #10 Taxol. 9. RTC on 12/23/2017 for GCSF injection.   Honor Loh, NP 12/15/2017,8:55 AM   I saw and evaluated the patient, participating in the key portions of the service and reviewing pertinent diagnostic studies and records.  I reviewed  the nurse practitioner's note and agree with the findings and the plan.  The assessment and plan were discussed with the patient.  Multiple questions were asked by the patient and answered.   Nolon Stalls, MD 12/15/2017,8:55 AM

## 2017-12-15 ENCOUNTER — Encounter: Payer: Self-pay | Admitting: Hematology and Oncology

## 2017-12-15 ENCOUNTER — Other Ambulatory Visit: Payer: Self-pay

## 2017-12-15 ENCOUNTER — Inpatient Hospital Stay (HOSPITAL_BASED_OUTPATIENT_CLINIC_OR_DEPARTMENT_OTHER): Payer: Medicare Other | Admitting: Hematology and Oncology

## 2017-12-15 ENCOUNTER — Inpatient Hospital Stay: Payer: Medicare Other

## 2017-12-15 VITALS — BP 135/71 | HR 88 | Temp 96.1°F | Resp 18 | Wt 138.7 lb

## 2017-12-15 DIAGNOSIS — Z87891 Personal history of nicotine dependence: Secondary | ICD-10-CM

## 2017-12-15 DIAGNOSIS — C50911 Malignant neoplasm of unspecified site of right female breast: Secondary | ICD-10-CM

## 2017-12-15 DIAGNOSIS — E876 Hypokalemia: Secondary | ICD-10-CM

## 2017-12-15 DIAGNOSIS — T451X5A Adverse effect of antineoplastic and immunosuppressive drugs, initial encounter: Secondary | ICD-10-CM

## 2017-12-15 DIAGNOSIS — R197 Diarrhea, unspecified: Secondary | ICD-10-CM

## 2017-12-15 DIAGNOSIS — C50411 Malignant neoplasm of upper-outer quadrant of right female breast: Secondary | ICD-10-CM

## 2017-12-15 DIAGNOSIS — D701 Agranulocytosis secondary to cancer chemotherapy: Secondary | ICD-10-CM | POA: Diagnosis not present

## 2017-12-15 DIAGNOSIS — I10 Essential (primary) hypertension: Secondary | ICD-10-CM

## 2017-12-15 DIAGNOSIS — E119 Type 2 diabetes mellitus without complications: Secondary | ICD-10-CM

## 2017-12-15 DIAGNOSIS — Z171 Estrogen receptor negative status [ER-]: Principal | ICD-10-CM

## 2017-12-15 DIAGNOSIS — C773 Secondary and unspecified malignant neoplasm of axilla and upper limb lymph nodes: Secondary | ICD-10-CM | POA: Diagnosis not present

## 2017-12-15 DIAGNOSIS — Z5111 Encounter for antineoplastic chemotherapy: Secondary | ICD-10-CM | POA: Diagnosis not present

## 2017-12-15 DIAGNOSIS — G620 Drug-induced polyneuropathy: Secondary | ICD-10-CM

## 2017-12-15 DIAGNOSIS — R634 Abnormal weight loss: Secondary | ICD-10-CM

## 2017-12-15 LAB — CBC WITH DIFFERENTIAL/PLATELET
Basophils Absolute: 0 10*3/uL (ref 0–0.1)
Basophils Relative: 1 %
Eosinophils Absolute: 0 10*3/uL (ref 0–0.7)
Eosinophils Relative: 1 %
HCT: 30.4 % — ABNORMAL LOW (ref 35.0–47.0)
Hemoglobin: 10.4 g/dL — ABNORMAL LOW (ref 12.0–16.0)
Lymphocytes Relative: 16 %
Lymphs Abs: 0.6 10*3/uL — ABNORMAL LOW (ref 1.0–3.6)
MCH: 32.5 pg (ref 26.0–34.0)
MCHC: 34.2 g/dL (ref 32.0–36.0)
MCV: 95 fL (ref 80.0–100.0)
Monocytes Absolute: 0.4 10*3/uL (ref 0.2–0.9)
Monocytes Relative: 10 %
Neutro Abs: 2.7 10*3/uL (ref 1.4–6.5)
Neutrophils Relative %: 72 %
Platelets: 181 10*3/uL (ref 150–440)
RBC: 3.2 MIL/uL — ABNORMAL LOW (ref 3.80–5.20)
RDW: 17.3 % — ABNORMAL HIGH (ref 11.5–14.5)
WBC: 3.8 10*3/uL (ref 3.6–11.0)

## 2017-12-15 LAB — COMPREHENSIVE METABOLIC PANEL
ALT: 37 U/L (ref 0–44)
AST: 34 U/L (ref 15–41)
Albumin: 3.9 g/dL (ref 3.5–5.0)
Alkaline Phosphatase: 63 U/L (ref 38–126)
Anion gap: 10 (ref 5–15)
BUN: 13 mg/dL (ref 8–23)
CO2: 24 mmol/L (ref 22–32)
Calcium: 9.2 mg/dL (ref 8.9–10.3)
Chloride: 103 mmol/L (ref 98–111)
Creatinine, Ser: 0.75 mg/dL (ref 0.44–1.00)
GFR calc Af Amer: >60 mL/min (ref >60)
GFR calc non Af Amer: >60 mL/min (ref >60)
Glucose, Bld: 174 mg/dL — ABNORMAL HIGH (ref 70–99)
Potassium: 3.4 mmol/L — ABNORMAL LOW (ref 3.5–5.1)
Sodium: 137 mmol/L (ref 135–145)
Total Bilirubin: 0.7 mg/dL (ref 0.3–1.2)
Total Protein: 6.3 g/dL — ABNORMAL LOW (ref 6.5–8.1)

## 2017-12-15 LAB — MAGNESIUM: Magnesium: 1.9 mg/dL (ref 1.7–2.4)

## 2017-12-15 MED ORDER — SODIUM CHLORIDE 0.9% FLUSH
10.0000 mL | INTRAVENOUS | Status: DC | PRN
  Administered 2017-12-15: 10 mL via INTRAVENOUS
  Filled 2017-12-15: qty 10

## 2017-12-15 MED ORDER — HEPARIN SOD (PORK) LOCK FLUSH 100 UNIT/ML IV SOLN
500.0000 [IU] | Freq: Once | INTRAVENOUS | Status: AC
  Administered 2017-12-15: 500 [IU] via INTRAVENOUS

## 2017-12-15 NOTE — Progress Notes (Signed)
Here for follow up. Per pt having approx 2 loose stools every am. Last thurs and Fri had 5-6 loose stools- called in and told to start BRAT  Diet-helped per pt. This am had 2 loose/ mushy stools -no abd pain per pt. Weight down 1.5 lb (approx )

## 2017-12-16 ENCOUNTER — Ambulatory Visit: Payer: Federal, State, Local not specified - PPO

## 2017-12-16 ENCOUNTER — Other Ambulatory Visit: Payer: Self-pay

## 2017-12-16 DIAGNOSIS — R197 Diarrhea, unspecified: Secondary | ICD-10-CM

## 2017-12-16 DIAGNOSIS — Z5111 Encounter for antineoplastic chemotherapy: Secondary | ICD-10-CM | POA: Diagnosis not present

## 2017-12-16 LAB — GASTROINTESTINAL PANEL BY PCR, STOOL (REPLACES STOOL CULTURE)

## 2017-12-16 LAB — C DIFFICILE QUICK SCREEN W PCR REFLEX
C DIFFICILE (CDIFF) TOXIN: NEGATIVE
C DIFFICLE (CDIFF) ANTIGEN: NEGATIVE
C Diff interpretation: NOT DETECTED

## 2017-12-22 ENCOUNTER — Inpatient Hospital Stay: Payer: Medicare Other

## 2017-12-22 ENCOUNTER — Inpatient Hospital Stay: Payer: Medicare Other | Attending: Hematology and Oncology

## 2017-12-22 ENCOUNTER — Inpatient Hospital Stay (HOSPITAL_BASED_OUTPATIENT_CLINIC_OR_DEPARTMENT_OTHER): Payer: Medicare Other | Admitting: Hematology and Oncology

## 2017-12-22 ENCOUNTER — Other Ambulatory Visit: Payer: Self-pay

## 2017-12-22 ENCOUNTER — Encounter: Payer: Self-pay | Admitting: Hematology and Oncology

## 2017-12-22 VITALS — BP 134/79 | HR 73 | Temp 97.2°F | Resp 18 | Wt 138.0 lb

## 2017-12-22 DIAGNOSIS — D701 Agranulocytosis secondary to cancer chemotherapy: Secondary | ICD-10-CM

## 2017-12-22 DIAGNOSIS — Z5189 Encounter for other specified aftercare: Secondary | ICD-10-CM | POA: Insufficient documentation

## 2017-12-22 DIAGNOSIS — Z5111 Encounter for antineoplastic chemotherapy: Secondary | ICD-10-CM

## 2017-12-22 DIAGNOSIS — C50911 Malignant neoplasm of unspecified site of right female breast: Secondary | ICD-10-CM

## 2017-12-22 DIAGNOSIS — E119 Type 2 diabetes mellitus without complications: Secondary | ICD-10-CM

## 2017-12-22 DIAGNOSIS — T451X5A Adverse effect of antineoplastic and immunosuppressive drugs, initial encounter: Secondary | ICD-10-CM

## 2017-12-22 DIAGNOSIS — Z87891 Personal history of nicotine dependence: Secondary | ICD-10-CM

## 2017-12-22 DIAGNOSIS — C773 Secondary and unspecified malignant neoplasm of axilla and upper limb lymph nodes: Secondary | ICD-10-CM | POA: Insufficient documentation

## 2017-12-22 DIAGNOSIS — Z171 Estrogen receptor negative status [ER-]: Principal | ICD-10-CM

## 2017-12-22 DIAGNOSIS — G620 Drug-induced polyneuropathy: Secondary | ICD-10-CM

## 2017-12-22 DIAGNOSIS — R110 Nausea: Secondary | ICD-10-CM

## 2017-12-22 DIAGNOSIS — I10 Essential (primary) hypertension: Secondary | ICD-10-CM

## 2017-12-22 DIAGNOSIS — C50411 Malignant neoplasm of upper-outer quadrant of right female breast: Secondary | ICD-10-CM

## 2017-12-22 DIAGNOSIS — R197 Diarrhea, unspecified: Secondary | ICD-10-CM

## 2017-12-22 LAB — COMPREHENSIVE METABOLIC PANEL
ALT: 37 U/L (ref 0–44)
AST: 25 U/L (ref 15–41)
Albumin: 4.2 g/dL (ref 3.5–5.0)
Alkaline Phosphatase: 56 U/L (ref 38–126)
Anion gap: 8 (ref 5–15)
BUN: 10 mg/dL (ref 8–23)
CO2: 28 mmol/L (ref 22–32)
Calcium: 9.5 mg/dL (ref 8.9–10.3)
Chloride: 102 mmol/L (ref 98–111)
Creatinine, Ser: 0.71 mg/dL (ref 0.44–1.00)
GFR calc Af Amer: >60 mL/min (ref >60)
GFR calc non Af Amer: >60 mL/min (ref >60)
Glucose, Bld: 109 mg/dL — ABNORMAL HIGH (ref 70–99)
Potassium: 3.5 mmol/L (ref 3.5–5.1)
Sodium: 138 mmol/L (ref 135–145)
Total Bilirubin: 1 mg/dL (ref 0.3–1.2)
Total Protein: 6.4 g/dL — ABNORMAL LOW (ref 6.5–8.1)

## 2017-12-22 LAB — CBC WITH DIFFERENTIAL/PLATELET
Basophils Absolute: 0 10*3/uL (ref 0–0.1)
Basophils Relative: 1 %
Eosinophils Absolute: 0.1 10*3/uL (ref 0–0.7)
Eosinophils Relative: 2 %
HCT: 32.5 % — ABNORMAL LOW (ref 35.0–47.0)
Hemoglobin: 11 g/dL — ABNORMAL LOW (ref 12.0–16.0)
Lymphocytes Relative: 23 %
Lymphs Abs: 0.6 10*3/uL — ABNORMAL LOW (ref 1.0–3.6)
MCH: 32.1 pg (ref 26.0–34.0)
MCHC: 33.9 g/dL (ref 32.0–36.0)
MCV: 94.7 fL (ref 80.0–100.0)
Monocytes Absolute: 0.3 10*3/uL (ref 0.2–0.9)
Monocytes Relative: 13 %
Neutro Abs: 1.6 10*3/uL (ref 1.4–6.5)
Neutrophils Relative %: 61 %
Platelets: 158 10*3/uL (ref 150–440)
RBC: 3.43 MIL/uL — ABNORMAL LOW (ref 3.80–5.20)
RDW: 17.5 % — ABNORMAL HIGH (ref 11.5–14.5)
WBC: 2.6 10*3/uL — ABNORMAL LOW (ref 3.6–11.0)

## 2017-12-22 LAB — MAGNESIUM: Magnesium: 1.9 mg/dL (ref 1.7–2.4)

## 2017-12-22 MED ORDER — DIPHENHYDRAMINE HCL 50 MG/ML IJ SOLN
50.0000 mg | Freq: Once | INTRAMUSCULAR | Status: AC
  Administered 2017-12-22: 50 mg via INTRAVENOUS
  Filled 2017-12-22: qty 1

## 2017-12-22 MED ORDER — SODIUM CHLORIDE 0.9 % IV SOLN
80.0000 mg/m2 | Freq: Once | INTRAVENOUS | Status: AC
  Administered 2017-12-22: 150 mg via INTRAVENOUS
  Filled 2017-12-22: qty 25

## 2017-12-22 MED ORDER — SODIUM CHLORIDE 0.9% FLUSH
10.0000 mL | INTRAVENOUS | Status: DC | PRN
  Filled 2017-12-22: qty 10

## 2017-12-22 MED ORDER — HEPARIN SOD (PORK) LOCK FLUSH 100 UNIT/ML IV SOLN: 500.0000 [IU] | Freq: Once | INTRAVENOUS | Status: DC | PRN

## 2017-12-22 MED ORDER — PALONOSETRON HCL INJECTION 0.25 MG/5ML: 0.2500 mg | Freq: Once | INTRAVENOUS | Status: DC

## 2017-12-22 MED ORDER — HEPARIN SOD (PORK) LOCK FLUSH 100 UNIT/ML IV SOLN
500.0000 [IU] | Freq: Once | INTRAVENOUS | Status: AC
  Administered 2017-12-22: 500 [IU] via INTRAVENOUS
  Filled 2017-12-22: qty 5

## 2017-12-22 MED ORDER — SODIUM CHLORIDE 0.9 % IV SOLN
Freq: Once | INTRAVENOUS | Status: AC
  Administered 2017-12-22: 10:00:00 via INTRAVENOUS
  Filled 2017-12-22: qty 250

## 2017-12-22 MED ORDER — SODIUM CHLORIDE 0.9% FLUSH
10.0000 mL | Freq: Once | INTRAVENOUS | Status: AC
  Administered 2017-12-22: 10 mL via INTRAVENOUS
  Filled 2017-12-22: qty 10

## 2017-12-22 MED ORDER — SODIUM CHLORIDE 0.9 % IV SOLN: Freq: Once | INTRAVENOUS | Status: DC

## 2017-12-22 MED ORDER — SODIUM CHLORIDE 0.9 % IV SOLN
20.0000 mg | Freq: Once | INTRAVENOUS | Status: AC
  Administered 2017-12-22: 20 mg via INTRAVENOUS
  Filled 2017-12-22: qty 2

## 2017-12-22 MED ORDER — FAMOTIDINE IN NACL 20-0.9 MG/50ML-% IV SOLN
20.0000 mg | Freq: Once | INTRAVENOUS | Status: AC
  Administered 2017-12-22: 20 mg via INTRAVENOUS
  Filled 2017-12-22: qty 50

## 2017-12-22 NOTE — Progress Notes (Signed)
[facility] day:  12/22/2017   Chief Complaint: Stephanie Sanders is a 72 y.o. female with multi-focal right breast cancer who is seen for assessment prior to week #10 Taxol.   HPI:  The patient was last seen in the medical oncology clinic on 12/15/2017.  At that time, chemotherapy was postponed secondary to frequent loose stools.  CBC revealed a WBC 3800 with an ANC of 2700.  During the interim, she notes that her stool has been loose/mushy 1/day in AM.  She had no loose stool yesterday.  She decreased her magnesium glycinate from 3 to 1 on 12/18/2017.  She tried Welchol, but it caused constipation.  Weight is stable.  She has on/off numbness and tingling in her fingertips.   Past Medical History:  Diagnosis Date  . Cancer ([place])   . Diabetes mellitus without complication ([place])   . Diabetic nephropathy ([place])   . Headache    migraines  . Heart murmur    ASYMPTOMATIC  . Hypertension   . Hypothyroidism   . Personal history of chemotherapy 2019   right breast cancer  . Seborrheic keratosis   . Vitamin D deficiency     Past Surgical History:  Procedure Laterality Date  . APPENDECTOMY    . BREAST BIOPSY Right 05/28/2017   right breast bx 3 areas invasive mamm ca lymph node mets  . BREAST CYST ASPIRATION Bilateral    neg  . CHOLECYSTECTOMY    . COLONOSCOPY WITH PROPOFOL N/A 01/01/2015   Procedure: COLONOSCOPY WITH PROPOFOL;  Surgeon: Josefine Class, MD;  Location: [facility] END[facility]COPY;  Service: Endoscopy;  Laterality: N/A;  . EYE SURGERY     eyelid  . PORTACATH PLACEMENT Right 06/10/2017   Procedure: INSERTION PORT-A-CATH;  Surgeon: Herbert Pun, MD;  Location: [facility] ORS;  Service: General;  Laterality: Right;  . STENT PLACEMENT ILIAC ([place] [facility])    . TUBAL LIGATION      Family History  Problem Relation Age of Onset  . Cancer Mother   . Cancer Maternal Aunt   . Breast cancer Neg Hx     Social History:  reports that she quit  smoking about 45 years ago. Her smoking use included cigarettes. She has a 20.00 pack-year smoking history. She has never used smokeless tobacco. She reports that she does not drink alcohol or use drugs.  She has 3 children (daughters: age 89 and 30; son Jaquelyn Bitter): age 34).  She is retired.  She worked for the [employer].  Her husband has dementia.  She lives in [place].  She is alone today.  Allergies:  Allergies  Allergen Reactions  . Fiorinal [Butalbital-Aspirin-Caffeine] Nausea And Vomiting and Other (See Comments)    SEVERE HEADACHE  . Sulfa Antibiotics Other (See Comments)    Burning esophagus and stomach  . Tramadol Nausea And Vomiting  . Other Rash    STERI-STRIPS-RASH STERI-STRIPS-RASH    Current Medications: Current Outpatient Medications  Medication Sig Dispense Refill  . atorvastatin (LIPITOR) 10 MG tablet Take 10 mg by mouth every evening.     . Blood Glucose Monitoring Suppl (FIFTY50 GLUCOSE METER 2.0) w/Device KIT One Touch Ultra Meter Use as instructed.    . Cholecalciferol 2000 UNITS CAPS Take 4,000 Units by mouth daily.     . colesevelam (WELCHOL) 625 MG tablet Take 1,875 mg by mouth every evening.     Marland Kitchen levothyroxine (SYNTHROID, LEVOTHROID) 50 MCG tablet Take 50 mcg by mouth daily before breakfast.    .  lidocaine-prilocaine (EMLA) cream Apply 1 application topically as needed. 30 g 6  . loratadine (CLARITIN) 10 MG tablet Take 10 mg by mouth daily.    Marland Kitchen LORazepam (ATIVAN) 0.5 MG tablet Take 1 tablet (0.5 mg total) by mouth every 6 (six) hours as needed (Nausea or vomiting). 30 tablet 0  . MAGNESIUM GLYCINATE PLUS PO Take 100 mg by mouth.    . Melatonin 5 MG TABS Take 5 mg by mouth at bedtime as needed (for sleep.).    Marland Kitchen metFORMIN (GLUCOPHAGE) 1000 MG tablet Take 1,000 mg by mouth 2 (two) times daily.    . quinapril (ACCUPRIL) 5 MG tablet Take 5 mg by mouth every morning.     . sodium bicarbonate 650 MG tablet Take 1,300 mg by mouth at bedtime.    Marland Kitchen acetaminophen (TYLENOL)  500 MG tablet Take 1,000 mg by mouth every 6 (six) hours as needed (for headaches.).    Marland Kitchen aspirin-acetaminophen-caffeine (EXCEDRIN MIGRAINE) 250-250-65 MG tablet Take 2 tablets by mouth every 6 (six) hours as needed for headache.    . dexamethasone (DECADRON) 4 MG tablet Take 2 tablets by mouth once a day on the day after chemotherapy and then take 2 tablets two times a day for 2 days. Take with food. (Patient not taking: Reported on 12/22/2017) 30 tablet 1  . nystatin (MYCOSTATIN) 100000 UNIT/ML suspension Take 5 mLs (500,000 Units total) by mouth 4 (four) times daily. (Patient not taking: Reported on 12/22/2017) 240 mL 0  . ondansetron (ZOFRAN) 8 MG tablet Take 1 tablet (8 mg total) by mouth 2 (two) times daily as needed. Start on the third day after chemotherapy. (Patient not taking: Reported on 12/22/2017) 30 tablet 1  . polyethylene glycol (MIRALAX / GLYCOLAX) packet Take 17 g by mouth daily.     No current facility-administered medications for this visit.    Facility-Administered Medications Ordered in Other Visits  Medication Dose Route Frequency Provider Last Rate Last Dose  . heparin lock flush 100 unit/mL  500 Units Intravenous Once Corcoran, Melissa C, MD      . sodium chloride flush (NS) 0.9 % injection 10 mL  10 mL Intravenous PRN Lequita Asal, MD   10 mL at 10/21/17 0841    Review of Systems  Constitutional: Positive for malaise/fatigue. Negative for chills, diaphoresis, fever and weight loss.       Feels "alright".  HENT: Negative.  Negative for congestion, ear discharge, ear pain, nosebleeds, sinus pain, sore throat and tinnitus.        Dysgeusia  Eyes: Negative.  Negative for blurred vision, double vision, photophobia, pain, discharge and redness.  Respiratory: Negative.  Negative for cough, sputum production and shortness of breath.   Cardiovascular: Negative.  Negative for chest pain, palpitations, orthopnea, leg swelling and PND.  Gastrointestinal: Negative for abdominal  pain, blood in stool, constipation, diarrhea, melena, nausea and vomiting.       Chronic loose stools 1/day.  Genitourinary: Negative.  Negative for dysuria, frequency, hematuria and urgency.  Musculoskeletal: Negative.  Negative for back pain, falls, joint pain, myalgias and neck pain.  Skin: Negative.  Negative for itching and rash.  Neurological: Positive for sensory change (off/on in fingertips). Negative for dizziness, tremors, weakness and headaches.  Endo/Heme/Allergies: Does not bruise/bleed easily.       Diabetes - on Metformin  Psychiatric/Behavioral: Negative for depression, memory loss and suicidal ideas. The patient is not nervous/anxious and does not have insomnia.   All other systems reviewed and are  negative.  Performance status (ECOG): 1 - Symptomatic but completely ambulatory  Vital signs BP 134/79 (BP Location: Left Arm, Patient Position: Sitting)   Pulse 73   Temp (!) 97.2 F (36.2 C) (Tympanic)   Resp 18   Wt 138 lb (62.6 kg)   BMI 20.98 kg/m   Physical Exam  Constitutional: She is oriented to person, place, and time and well-developed, well-nourished, and in no distress.  HENT:  Head: Normocephalic and atraumatic.  Wearing a blue wrap.  Eyes: Pupils are equal, round, and reactive to light. Conjunctivae and EOM are normal. No scleral icterus.  Blue eyes.   Neck: Normal range of motion. Neck supple. No JVD present.  Cardiovascular: Normal rate, regular rhythm and normal heart sounds. Exam reveals no gallop and no friction rub.  No murmur heard. Pulmonary/Chest: Effort normal and breath sounds normal. No respiratory distress. She has no wheezes. She has no rales.  Abdominal: Soft. Bowel sounds are normal. She exhibits no distension and no mass. There is no tenderness. There is no rebound and no guarding.  Musculoskeletal: Normal range of motion. She exhibits no edema or tenderness.  Lymphadenopathy:    She has no cervical adenopathy.    She has no axillary  adenopathy.       Right: No supraclavicular adenopathy present.       Left: No supraclavicular adenopathy present.  Neurological: She is alert and oriented to person, place, and time.  Skin: Skin is warm and dry. No rash noted. No erythema.  Psychiatric: Mood, affect and judgment normal.  Nursing note and vitals reviewed.   Infusion on 12/22/2017  Component Date Value Ref Range Status  . Magnesium 12/22/2017 1.9  1.7 - 2.4 mg/dL Final   Performed at [facility], [address], [place], [place] [address]  . Sodium 12/22/2017 138  135 - 145 mmol/L Final  . Potassium 12/22/2017 3.5  3.5 - 5.1 mmol/L Final  . Chloride 12/22/2017 102  98 - 111 mmol/L Final  . CO2 12/22/2017 28  22 - 32 mmol/L Final  . Glucose, Bld 12/22/2017 109* 70 - 99 mg/dL Final  . BUN 12/22/2017 10  8 - 23 mg/dL Final  . Creatinine, Ser 12/22/2017 0.71  0.44 - 1.00 mg/dL Final  . Calcium 12/22/2017 9.5  8.9 - 10.3 mg/dL Final  . Total Protein 12/22/2017 6.4* 6.5 - 8.1 g/dL Final  . Albumin 12/22/2017 4.2  3.5 - 5.0 g/dL Final  . AST 12/22/2017 25  15 - 41 U/L Final  . ALT 12/22/2017 37  0 - 44 U/L Final  . Alkaline Phosphatase 12/22/2017 56  38 - 126 U/L Final  . Total Bilirubin 12/22/2017 1.0  0.3 - 1.2 mg/dL Final  . GFR calc non Af Amer 12/22/2017 >60  >60 mL/min Final  . GFR calc Af Amer 12/22/2017 >60  >60 mL/min Final   Comment: (NOTE) The eGFR has been calculated using the CKD EPI equation. This calculation has not been validated in all clinical situations. eGFR's persistently <60 mL/min signify possible Chronic Kidney Disease.   Georgiann Hahn gap 12/22/2017 8  5 - 15 Final   Performed at [facility], [address] [place], [place] [address]  . WBC 12/22/2017 2.6* 3.6 - 11.0 K/uL Final  . RBC 12/22/2017 3.43* 3.80 - 5.20 MIL/uL Final  . Hemoglobin 12/22/2017 11.0* 12.0 - 16.0 g/dL Final  . HCT 12/22/2017 32.5* 35.0 - 47.0 % Final  . MCV 12/22/2017 94.7  80.0 - 100.0  fL Final  . MCH  12/22/2017 32.1  26.0 - 34.0 pg Final  . MCHC 12/22/2017 33.9  32.0 - 36.0 g/dL Final  . RDW 12/22/2017 17.5* 11.5 - 14.5 % Final  . Platelets 12/22/2017 158  150 - 440 K/uL Final  . Neutrophils Relative % 12/22/2017 61  % Final  . Neutro Abs 12/22/2017 1.6  1.4 - 6.5 K/uL Final  . Lymphocytes Relative 12/22/2017 23  % Final  . Lymphs Abs 12/22/2017 0.6* 1.0 - 3.6 K/uL Final  . Monocytes Relative 12/22/2017 13  % Final  . Monocytes Absolute 12/22/2017 0.3  0.2 - 0.9 K/uL Final  . Eosinophils Relative 12/22/2017 2  % Final  . Eosinophils Absolute 12/22/2017 0.1  0 - 0.7 K/uL Final  . Basophils Relative 12/22/2017 1  % Final  . Basophils Absolute 12/22/2017 0.0  0 - 0.1 K/uL Final   Performed at [facility], [address], [place], [place] [address]    Assessment:  Stephanie Sanders is a 72 y.o. 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  Pathology revealed two grade II invasive mammary carcinomas of no special type (4 cm from the nipple and 5 cm from the nipple).  Tumors were in close proximity.  Lymph node biopsy confirmed metastatic carcinoma of breast origin.  Tumor is ER negative (< 1%), PR negative and Her 2/neu 2+ by IHC.  [facility] genetic testing on 07/21/2017 revealed a variant of uncertain significance in RECQL4.  Right sided mammogram and ultrasound on 05/21/2017 revealed 2 highly suspicious masses in the right breast at the 10:30 position 4 cm from nipple (1.8 x 1.2 x 1.1 cm) and at the 10:30 position 5 cm from nipple (1.1 x 0.9 x 0.9 cm).  There was a suspicious 2.7 x 1 x 2.2 cm lymph node in the right axilla with asymmetric nodular cortical thickening.  Ultrasound guided biopsy of the 2 masses and lymph node on 05/28/2017 revealed grade II invasive mammary carcinoma of no special type (4 cm from the nipple and 5 cm from the nipple).  Lymph node biopsy revealed metastatic carcinoma of breast origin.  Tumor is ER  negative (< 1%), PR negative and Her 2/neu 2+ by IHC.  FISH was negative.  CA 27.29 was 16.5 and CA15-3 was 17.6 on 06/04/2017.  Echo on 06/09/2017 revealed an EF of 60-65%.  She has enrolled on the UPBEAT clinical trial.  She received 4 cycles of AC (06/23/2017 - 09/08/2017) with Udencya support.  Cycle #1 was complicated by influenza A.  Nadir counts included an ANC of 0 and platelet count of 71,000. Cycle #2 postponed due to slow recovery from influenza and excisional cyst removal with surrounding cellulitis (treated with 10 days of doxycycline).   She is s/p week #9 Taxol (09/29/2017 - 11/10/2017; 11/24/2017 - 12/08/2017).  She received GCSF after week #3 and 5 Taxol secondary to borderline counts. Cycle #5 was postponed on 10/29/2017 due to patient's wishes to be treated on Tuesdays only. Week #7 Taxol was held secondary to neutropenia (ANC 200).  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  Normal labs on 11/24/2017 included:  B12, folate, TSH, and copper.  Right mammogram and ultrasound on 09/28/2017 revealed the 2 adjacent masses in the right breast at the 10:30 o'clock position had both decreased in size (1.8 x 1.1 x 1.2 cm to 1.2 x 0.7 x 1.1 cm; 11 x 9 x 9 mm to 7 x 6  x 6 mm) as well as the metastatic right axillary lymph node (2 .7 cm to 1.6 x 0.9 x 1.7 cm).    She was admitted to [facility] from 06/28/2017 - 07/01/2017 with with fever, cough and generalized weakness.  Nasal swab was + for influenza A.  She completed a course of Tamiflu.  Symptomatically, she feels "alright".  Loose stools have improved.  Exam is stable. WBC is 2600 (ANC 1600).  Plan: 1. Labs today: CBC with differential, CMP, magnesium 2. Breast cancer - treatment ongoing  Patient back to baseline stool pattern (diarrhea resolved).  Tolerating chemotherapy well (not affecting stool pattern).  Labs reviewed.  Blood counts adequate for treatment.  Proceed with cycle #10 Taxol today.  Discuss symptom management.  She   has antiemetics and pain medications at home to use on a prn bases.  Interventions are adequate.  3. 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  WBC 2600 (ANC 1600).   Discuss need for ongoing GCSF support.  RTC tomorrow for GCSF.  RTC on 09/06 for CBC with diff +/- GCSF. 4. Chemotherapy induced neuropathy - stable  Off and on symptoms.  Continue to monitor closely. 5. Protein calorie nutrition - ongoing monitoring  Weight stable.  Continue to monitor.  6. Diarrhea - improved  Loose stools back to baseline.  Continue magnesium 1/day (instead of 3/day).  Welchol causes constipation. 7.   RTC in 1 week for MD assessment, labs (CBC with diff, CMP, Mg), and cycle #11 Taxol 8.   RTC in 2 weeks for MD assessment, labs (CBC with diff, CMP, Mg), and cycle #12 Taxol.   Honor Loh, NP 12/22/2017,9:43 AM   I saw and evaluated the patient, participating in the key portions of the service and reviewing pertinent diagnostic studies and records.  I reviewed the nurse practitioner's note and agree with the findings and the plan.  The assessment and plan were discussed with the patient.  Multiple questions were asked by the patient and answered.   Nolon Stalls, MD 12/22/2017,9:43 AM

## 2017-12-22 NOTE — Progress Notes (Signed)
Here for follow up. Stated BM are daily  And have been mushy but this am more formed. Stated a little bit more energy. Stated vision on and off blurry. ( stated will monitor )

## 2017-12-23 ENCOUNTER — Inpatient Hospital Stay: Payer: Medicare Other

## 2017-12-23 DIAGNOSIS — R110 Nausea: Secondary | ICD-10-CM

## 2017-12-23 DIAGNOSIS — Z5111 Encounter for antineoplastic chemotherapy: Secondary | ICD-10-CM | POA: Diagnosis not present

## 2017-12-23 DIAGNOSIS — T451X5A Adverse effect of antineoplastic and immunosuppressive drugs, initial encounter: Principal | ICD-10-CM

## 2017-12-23 MED ORDER — TBO-FILGRASTIM 480 MCG/0.8ML ~~LOC~~ SOSY
480.0000 ug | PREFILLED_SYRINGE | Freq: Once | SUBCUTANEOUS | Status: AC
  Administered 2017-12-23: 480 ug via SUBCUTANEOUS

## 2017-12-25 ENCOUNTER — Inpatient Hospital Stay: Payer: Medicare Other

## 2017-12-25 ENCOUNTER — Other Ambulatory Visit: Payer: Self-pay

## 2017-12-25 DIAGNOSIS — Z171 Estrogen receptor negative status [ER-]: Principal | ICD-10-CM

## 2017-12-25 DIAGNOSIS — Z5111 Encounter for antineoplastic chemotherapy: Secondary | ICD-10-CM | POA: Diagnosis not present

## 2017-12-25 DIAGNOSIS — C50911 Malignant neoplasm of unspecified site of right female breast: Secondary | ICD-10-CM

## 2017-12-25 LAB — CBC WITH DIFFERENTIAL/PLATELET
Basophils Absolute: 0 10*3/uL (ref 0–0.1)
Basophils Relative: 0 %
Eosinophils Absolute: 0.1 10*3/uL (ref 0–0.7)
Eosinophils Relative: 1 %
HCT: 33 % — ABNORMAL LOW (ref 35.0–47.0)
Hemoglobin: 10.9 g/dL — ABNORMAL LOW (ref 12.0–16.0)
Lymphocytes Relative: 8 %
Lymphs Abs: 0.6 10*3/uL — ABNORMAL LOW (ref 1.0–3.6)
MCH: 31.6 pg (ref 26.0–34.0)
MCHC: 33.1 g/dL (ref 32.0–36.0)
MCV: 95.5 fL (ref 80.0–100.0)
Monocytes Absolute: 0.3 10*3/uL (ref 0.2–0.9)
Monocytes Relative: 4 %
Neutro Abs: 7.4 10*3/uL — ABNORMAL HIGH (ref 1.4–6.5)
Neutrophils Relative %: 87 %
Platelets: 144 10*3/uL — ABNORMAL LOW (ref 150–440)
RBC: 3.45 MIL/uL — ABNORMAL LOW (ref 3.80–5.20)
RDW: 17.7 % — ABNORMAL HIGH (ref 11.5–14.5)
WBC: 8.5 10*3/uL (ref 3.6–11.0)

## 2017-12-29 ENCOUNTER — Other Ambulatory Visit: Payer: Self-pay | Admitting: Hematology and Oncology

## 2017-12-29 ENCOUNTER — Encounter: Payer: Self-pay | Admitting: Hematology and Oncology

## 2017-12-29 ENCOUNTER — Inpatient Hospital Stay: Payer: Medicare Other

## 2017-12-29 ENCOUNTER — Inpatient Hospital Stay (HOSPITAL_BASED_OUTPATIENT_CLINIC_OR_DEPARTMENT_OTHER): Payer: Medicare Other | Admitting: Hematology and Oncology

## 2017-12-29 VITALS — BP 123/83 | HR 75 | Temp 97.7°F | Resp 18 | Wt 138.1 lb

## 2017-12-29 DIAGNOSIS — T451X5A Adverse effect of antineoplastic and immunosuppressive drugs, initial encounter: Secondary | ICD-10-CM

## 2017-12-29 DIAGNOSIS — Z171 Estrogen receptor negative status [ER-]: Secondary | ICD-10-CM

## 2017-12-29 DIAGNOSIS — D701 Agranulocytosis secondary to cancer chemotherapy: Secondary | ICD-10-CM | POA: Diagnosis not present

## 2017-12-29 DIAGNOSIS — Z87891 Personal history of nicotine dependence: Secondary | ICD-10-CM

## 2017-12-29 DIAGNOSIS — G620 Drug-induced polyneuropathy: Secondary | ICD-10-CM

## 2017-12-29 DIAGNOSIS — C50411 Malignant neoplasm of upper-outer quadrant of right female breast: Secondary | ICD-10-CM | POA: Diagnosis not present

## 2017-12-29 DIAGNOSIS — Z5111 Encounter for antineoplastic chemotherapy: Secondary | ICD-10-CM | POA: Diagnosis not present

## 2017-12-29 DIAGNOSIS — C50211 Malignant neoplasm of upper-inner quadrant of right female breast: Secondary | ICD-10-CM

## 2017-12-29 DIAGNOSIS — C773 Secondary and unspecified malignant neoplasm of axilla and upper limb lymph nodes: Secondary | ICD-10-CM | POA: Diagnosis not present

## 2017-12-29 DIAGNOSIS — C50911 Malignant neoplasm of unspecified site of right female breast: Secondary | ICD-10-CM

## 2017-12-29 DIAGNOSIS — E119 Type 2 diabetes mellitus without complications: Secondary | ICD-10-CM

## 2017-12-29 DIAGNOSIS — R110 Nausea: Secondary | ICD-10-CM

## 2017-12-29 DIAGNOSIS — I10 Essential (primary) hypertension: Secondary | ICD-10-CM

## 2017-12-29 LAB — COMPREHENSIVE METABOLIC PANEL
ALT: 23 U/L (ref 0–44)
AST: 23 U/L (ref 15–41)
Albumin: 4.2 g/dL (ref 3.5–5.0)
Alkaline Phosphatase: 53 U/L (ref 38–126)
Anion gap: 8 (ref 5–15)
BUN: 8 mg/dL (ref 8–23)
CO2: 27 mmol/L (ref 22–32)
Calcium: 9.2 mg/dL (ref 8.9–10.3)
Chloride: 105 mmol/L (ref 98–111)
Creatinine, Ser: 0.66 mg/dL (ref 0.44–1.00)
GFR calc Af Amer: >60 mL/min (ref >60)
GFR calc non Af Amer: >60 mL/min (ref >60)
Glucose, Bld: 100 mg/dL — ABNORMAL HIGH (ref 70–99)
Potassium: 3.8 mmol/L (ref 3.5–5.1)
Sodium: 140 mmol/L (ref 135–145)
Total Bilirubin: 0.7 mg/dL (ref 0.3–1.2)
Total Protein: 6.5 g/dL (ref 6.5–8.1)

## 2017-12-29 LAB — CBC WITH DIFFERENTIAL/PLATELET
Basophils Absolute: 0 10*3/uL (ref 0–0.1)
Basophils Relative: 1 %
Eosinophils Absolute: 0.1 10*3/uL (ref 0–0.7)
Eosinophils Relative: 3 %
HCT: 32.6 % — ABNORMAL LOW (ref 35.0–47.0)
Hemoglobin: 10.9 g/dL — ABNORMAL LOW (ref 12.0–16.0)
Lymphocytes Relative: 18 %
Lymphs Abs: 0.6 10*3/uL — ABNORMAL LOW (ref 1.0–3.6)
MCH: 31.5 pg (ref 26.0–34.0)
MCHC: 33.4 g/dL (ref 32.0–36.0)
MCV: 94.2 fL (ref 80.0–100.0)
Monocytes Absolute: 0.7 10*3/uL (ref 0.2–0.9)
Monocytes Relative: 19 %
Neutro Abs: 2.1 10*3/uL (ref 1.4–6.5)
Neutrophils Relative %: 59 %
Platelets: 163 10*3/uL (ref 150–440)
RBC: 3.46 MIL/uL — ABNORMAL LOW (ref 3.80–5.20)
RDW: 16.9 % — ABNORMAL HIGH (ref 11.5–14.5)
WBC: 3.5 10*3/uL — ABNORMAL LOW (ref 3.6–11.0)

## 2017-12-29 LAB — MAGNESIUM: Magnesium: 1.9 mg/dL (ref 1.7–2.4)

## 2017-12-29 MED ORDER — SODIUM CHLORIDE 0.9 % IV SOLN
80.0000 mg/m2 | Freq: Once | INTRAVENOUS | Status: AC
  Administered 2017-12-29: 150 mg via INTRAVENOUS
  Filled 2017-12-29: qty 25

## 2017-12-29 MED ORDER — HEPARIN SOD (PORK) LOCK FLUSH 100 UNIT/ML IV SOLN
500.0000 [IU] | Freq: Once | INTRAVENOUS | Status: AC
  Administered 2017-12-29: 500 [IU] via INTRAVENOUS
  Filled 2017-12-29: qty 5

## 2017-12-29 MED ORDER — SODIUM CHLORIDE 0.9% FLUSH
10.0000 mL | Freq: Once | INTRAVENOUS | Status: AC
  Administered 2017-12-29: 10 mL via INTRAVENOUS
  Filled 2017-12-29: qty 10

## 2017-12-29 MED ORDER — FAMOTIDINE IN NACL 20-0.9 MG/50ML-% IV SOLN
20.0000 mg | Freq: Once | INTRAVENOUS | Status: AC
  Administered 2017-12-29: 20 mg via INTRAVENOUS
  Filled 2017-12-29: qty 50

## 2017-12-29 MED ORDER — SODIUM CHLORIDE 0.9 % IV SOLN
20.0000 mg | Freq: Once | INTRAVENOUS | Status: AC
  Administered 2017-12-29: 20 mg via INTRAVENOUS
  Filled 2017-12-29: qty 2

## 2017-12-29 MED ORDER — SODIUM CHLORIDE 0.9 % IV SOLN
Freq: Once | INTRAVENOUS | Status: AC
  Administered 2017-12-29: 12:00:00 via INTRAVENOUS
  Filled 2017-12-29: qty 250

## 2017-12-29 MED ORDER — DIPHENHYDRAMINE HCL 50 MG/ML IJ SOLN
50.0000 mg | Freq: Once | INTRAMUSCULAR | Status: AC
  Administered 2017-12-29: 50 mg via INTRAVENOUS
  Filled 2017-12-29: qty 1

## 2017-12-29 NOTE — Progress Notes (Signed)
[facility] day:  12/29/2017   Chief Complaint: Stephanie Sanders is a 72 y.o. female with multi-focal right breast cancer who is seen for assessment prior to week #11 Taxol.   HPI:  The patient was last seen in the medical oncology clinic on 12/22/2017.  She was doing well.  Diarrhea had resolved.  She noted 1 soft stool/day.  Magnesium glycynate had been decreased from 3 to 1 a day.  She was not taking Welchol as it caused constipation.  She had on/off numbness and tingling in her fingertips.    ]She received week #10 Taxol.  CBC revealed a hematocrit of 32.5, hemoglobin 11.0, MCV 94.7, platelets 158,000, WBC 2600 with an ANC of 1600.  She received GCSF on 12/23/2017. CBC on 12/25/2017 revealed a WBC 8500 and an ANC of 7400.  During the interim, she has done well.  She notes that it has been "the best week ever".  Appetite is better.  Food tastes better.  Weight is stable.  On 12/24/2017, she had 3 loose stools.  She had 1 loose stool today.  Her left foot has numbness and tingling that comes and goes.   Past Medical History:  Diagnosis Date  . Cancer ([place])   . Diabetes mellitus without complication ([place])   . Diabetic nephropathy ([place])   . Headache    migraines  . Heart murmur    ASYMPTOMATIC  . Hypertension   . Hypothyroidism   . Personal history of chemotherapy 2019   right breast cancer  . Seborrheic keratosis   . Vitamin D deficiency     Past Surgical History:  Procedure Laterality Date  . APPENDECTOMY    . BREAST BIOPSY Right 05/28/2017   right breast bx 3 areas invasive mamm ca lymph node mets  . BREAST CYST ASPIRATION Bilateral    neg  . CHOLECYSTECTOMY    . COLONOSCOPY WITH PROPOFOL N/A 01/01/2015   Procedure: COLONOSCOPY WITH PROPOFOL;  Surgeon: Josefine Class, MD;  Location: [facility] ENDOSCOPY;  Service: Endoscopy;  Laterality: N/A;  . EYE SURGERY     eyelid  . PORTACATH PLACEMENT Right 06/10/2017   Procedure:  INSERTION PORT-A-CATH;  Surgeon: Herbert Pun, MD;  Location: [facility] ORS;  Service: General;  Laterality: Right;  . STENT PLACEMENT ILIAC ([place] HX)    . TUBAL LIGATION      Family History  Problem Relation Age of Onset  . Cancer Mother   . Cancer Maternal Aunt   . Breast cancer Neg Hx     Social History:  reports that she quit smoking about 45 years ago. Her smoking use included cigarettes. She has a 20.00 pack-year smoking history. She has never used smokeless tobacco. She reports that she does not drink alcohol or use drugs.  She has 3 children (daughters: age 62 and 10; son Jaquelyn Bitter): age 58).  She is retired.  She worked for the [employer].  Her husband has dementia.  She lives in [place].  She is alone today.  Allergies:  Allergies  Allergen Reactions  . Fiorinal [Butalbital-Aspirin-Caffeine] Nausea And Vomiting and Other (See Comments)    SEVERE HEADACHE  . Sulfa Antibiotics Other (See Comments)    Burning esophagus and stomach  . Tramadol Nausea And Vomiting  . Other Rash    STERI-STRIPS-RASH STERI-STRIPS-RASH    Current Medications: Current Outpatient Medications  Medication Sig Dispense Refill  . acetaminophen (TYLENOL) 500 MG tablet Take 1,000 mg by mouth every 6 (six)  hours as needed (for headaches.).    Marland Kitchen atorvastatin (LIPITOR) 10 MG tablet Take 10 mg by mouth every evening.     . Blood Glucose Monitoring Suppl (FIFTY50 GLUCOSE METER 2.0) w/Device KIT One Touch Ultra Meter Use as instructed.    . Cholecalciferol 2000 UNITS CAPS Take 4,000 Units by mouth daily.     . colesevelam (WELCHOL) 625 MG tablet Take 1,875 mg by mouth every evening.     Marland Kitchen dexamethasone (DECADRON) 4 MG tablet Take 2 tablets by mouth once a day on the day after chemotherapy and then take 2 tablets two times a day for 2 days. Take with food. 30 tablet 1  . levothyroxine (SYNTHROID, LEVOTHROID) 50 MCG tablet Take 50 mcg by mouth daily before breakfast.    . lidocaine-prilocaine (EMLA) cream Apply  1 application topically as needed. 30 g 6  . loratadine (CLARITIN) 10 MG tablet Take 10 mg by mouth daily.    Marland Kitchen MAGNESIUM GLYCINATE PLUS PO Take 100 mg by mouth.    . Melatonin 5 MG TABS Take 5 mg by mouth at bedtime as needed (for sleep.).    Marland Kitchen metFORMIN (GLUCOPHAGE) 1000 MG tablet Take 1,000 mg by mouth 2 (two) times daily.    . polyethylene glycol (MIRALAX / GLYCOLAX) packet Take 17 g by mouth daily.    . quinapril (ACCUPRIL) 5 MG tablet Take 5 mg by mouth every morning.     . sodium bicarbonate 650 MG tablet Take 1,300 mg by mouth at bedtime.    Marland Kitchen aspirin-acetaminophen-caffeine (EXCEDRIN MIGRAINE) 250-250-65 MG tablet Take 2 tablets by mouth every 6 (six) hours as needed for headache.    Marland Kitchen LORazepam (ATIVAN) 0.5 MG tablet Take 1 tablet (0.5 mg total) by mouth every 6 (six) hours as needed (Nausea or vomiting). 30 tablet 0  . nystatin (MYCOSTATIN) 100000 UNIT/ML suspension Take 5 mLs (500,000 Units total) by mouth 4 (four) times daily. (Patient not taking: Reported on 12/22/2017) 240 mL 0  . ondansetron (ZOFRAN) 8 MG tablet Take 1 tablet (8 mg total) by mouth 2 (two) times daily as needed. Start on the third day after chemotherapy. (Patient not taking: Reported on 12/22/2017) 30 tablet 1   No current facility-administered medications for this visit.    Facility-Administered Medications Ordered in Other Visits  Medication Dose Route Frequency Provider Last Rate Last Dose  . sodium chloride flush (NS) 0.9 % injection 10 mL  10 mL Intravenous PRN Lequita Asal, MD   10 mL at 10/21/17 0841    Review of Systems  Constitutional: Negative.  Negative for chills, diaphoresis, fever, malaise/fatigue and weight loss.       "Best week I have had".  HENT: Negative.  Negative for congestion, ear discharge, ear pain, hearing loss, nosebleeds, sinus pain, sore throat and tinnitus.        Food tastes better.  Eyes: Negative.  Negative for blurred vision, double vision, photophobia, pain, discharge and  redness.  Respiratory: Negative.  Negative for cough, hemoptysis and shortness of breath.   Cardiovascular: Negative.  Negative for chest pain, palpitations, orthopnea, leg swelling and PND.  Gastrointestinal: Positive for diarrhea (chronic) and nausea (controlled). Negative for abdominal pain, blood in stool, constipation, heartburn, melena and vomiting.       Loose stool- little (stable).  Genitourinary: Negative.  Negative for dysuria, frequency, hematuria and urgency.  Musculoskeletal: Negative.  Negative for back pain, falls, joint pain and myalgias.  Skin: Negative.  Negative for itching and rash.  Neurological: Positive for sensory change. Negative for dizziness, tremors, speech change, focal weakness, weakness and headaches.       Left foot numbness and tingling- comes and goes.  Endo/Heme/Allergies: Does not bruise/bleed easily.       Diabetes - on Metformin  Psychiatric/Behavioral: Negative for depression and memory loss. The patient is not nervous/anxious and does not have insomnia.   All other systems reviewed and are negative.  Performance status (ECOG): 1 - Symptomatic but completely ambulatory  Vital signs BP 123/83   Pulse 75   Temp 97.7 F (36.5 C)   Resp 18   Wt 138 lb 2 oz (62.7 kg)   BMI 21.00 kg/m   Physical Exam  Constitutional: She is oriented to person, place, and time and well-developed, well-nourished, and in no distress.  HENT:  Head: Normocephalic and atraumatic.  Mouth/Throat: No oropharyngeal exudate.  Wearing a white scarf.  Eyes: Pupils are equal, round, and reactive to light. Conjunctivae and EOM are normal. No scleral icterus.  Gold rimmed glasses.  Blue eyes.   Neck: Normal range of motion. Neck supple. No JVD present.  Cardiovascular: Normal rate, regular rhythm and normal heart sounds. Exam reveals no gallop and no friction rub.  No murmur heard. Pulmonary/Chest: Effort normal and breath sounds normal. No respiratory distress. She has no  wheezes. She has no rales.  Abdominal: Soft. Bowel sounds are normal. She exhibits no distension and no mass. There is no tenderness. There is no rebound and no guarding.  Musculoskeletal: Normal range of motion.  Lymphadenopathy:    She has no cervical adenopathy.    She has no axillary adenopathy.       Right: No supraclavicular adenopathy present.       Left: No supraclavicular adenopathy present.  Neurological: She is alert and oriented to person, place, and time.  Skin: Skin is warm and dry. No rash noted. No erythema.  Psychiatric: Mood, affect and judgment normal.  Nursing note and vitals reviewed.   Infusion on 12/29/2017  Component Date Value Ref Range Status  . Sodium 12/29/2017 140  135 - 145 mmol/L Final  . Potassium 12/29/2017 3.8  3.5 - 5.1 mmol/L Final  . Chloride 12/29/2017 105  98 - 111 mmol/L Final  . CO2 12/29/2017 27  22 - 32 mmol/L Final  . Glucose, Bld 12/29/2017 100* 70 - 99 mg/dL Final  . BUN 12/29/2017 8  8 - 23 mg/dL Final  . Creatinine, Ser 12/29/2017 0.66  0.44 - 1.00 mg/dL Final  . Calcium 12/29/2017 9.2  8.9 - 10.3 mg/dL Final  . Total Protein 12/29/2017 6.5  6.5 - 8.1 g/dL Final  . Albumin 12/29/2017 4.2  3.5 - 5.0 g/dL Final  . AST 12/29/2017 23  15 - 41 U/L Final  . ALT 12/29/2017 23  0 - 44 U/L Final  . Alkaline Phosphatase 12/29/2017 53  38 - 126 U/L Final  . Total Bilirubin 12/29/2017 0.7  0.3 - 1.2 mg/dL Final  . GFR calc non Af Amer 12/29/2017 >60  >60 mL/min Final  . GFR calc Af Amer 12/29/2017 >60  >60 mL/min Final   Comment: (NOTE) The eGFR has been calculated using the CKD EPI equation. This calculation has not been validated in all clinical situations. eGFR's persistently <60 mL/min signify possible Chronic Kidney Disease.   Georgiann Hahn gap 12/29/2017 8  5 - 15 Final   Performed at [facility], [address] [place], [place] [address]  . WBC 12/29/2017 3.5*  3.6 - 11.0 K/uL Final  . RBC 12/29/2017 3.46* 3.80 - 5.20 MIL/uL Final   . Hemoglobin 12/29/2017 10.9* 12.0 - 16.0 g/dL Final  . HCT 12/29/2017 32.6* 35.0 - 47.0 % Final  . MCV 12/29/2017 94.2  80.0 - 100.0 fL Final  . MCH 12/29/2017 31.5  26.0 - 34.0 pg Final  . MCHC 12/29/2017 33.4  32.0 - 36.0 g/dL Final  . RDW 12/29/2017 16.9* 11.5 - 14.5 % Final  . Platelets 12/29/2017 163  150 - 440 K/uL Final  . Neutrophils Relative % 12/29/2017 59  % Final  . Neutro Abs 12/29/2017 2.1  1.4 - 6.5 K/uL Final  . Lymphocytes Relative 12/29/2017 18  % Final  . Lymphs Abs 12/29/2017 0.6* 1.0 - 3.6 K/uL Final  . Monocytes Relative 12/29/2017 19  % Final  . Monocytes Absolute 12/29/2017 0.7  0.2 - 0.9 K/uL Final  . Eosinophils Relative 12/29/2017 3  % Final  . Eosinophils Absolute 12/29/2017 0.1  0 - 0.7 K/uL Final  . Basophils Relative 12/29/2017 1  % Final  . Basophils Absolute 12/29/2017 0.0  0 - 0.1 K/uL Final   Performed at [facility], [address] [place], [place] [address]  . Magnesium 12/29/2017 1.9  1.7 - 2.4 mg/dL Final   Performed at [facility], [address] [place], [place] [address]    Assessment:  KATERIA CUTRONA is a 72 y.o. 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  Pathology revealed two grade II invasive mammary carcinomas of no special type (4 cm from the nipple and 5 cm from the nipple).  Tumors were in close proximity.  Lymph node biopsy confirmed metastatic carcinoma of breast origin.  Tumor is ER negative (< 1%), PR negative and Her 2/neu 2+ by IHC.  [facility] genetic testing on 07/21/2017 revealed a variant of uncertain significance in RECQL4.  Right sided mammogram and ultrasound on 05/21/2017 revealed 2 highly suspicious masses in the right breast at the 10:30 position 4 cm from nipple (1.8 x 1.2 x 1.1 cm) and at the 10:30 position 5 cm from nipple (1.1 x 0.9 x 0.9 cm).  There was a suspicious 2.7 x 1 x 2.2 cm lymph node in the right axilla with asymmetric nodular  cortical thickening.  Ultrasound guided biopsy of the 2 masses and lymph node on 05/28/2017 revealed grade II invasive mammary carcinoma of no special type (4 cm from the nipple and 5 cm from the nipple).  Lymph node biopsy revealed metastatic carcinoma of breast origin.  Tumor is ER negative (< 1%), PR negative and Her 2/neu 2+ by IHC.  FISH was negative.  CA 27.29 was 16.5 and CA15-3 was 17.6 on 06/04/2017.  Echo on 06/09/2017 revealed an EF of 60-65%.  She has enrolled on the UPBEAT clinical trial.  She received 4 cycles of AC (06/23/2017 - 09/08/2017) with Udencya support.  Cycle #1 was complicated by influenza A.  Nadir counts included an ANC of 0 and platelet count of 71,000. Cycle #2 postponed due to slow recovery from influenza and excisional cyst removal with surrounding cellulitis (treated with 10 days of doxycycline).   She is s/p week #10 Taxol (09/29/2017 - 11/10/2017; 11/24/2017 - 12/08/2017; 12/22/2017).  She received GCSF after week #3 and 5 Taxol secondary to borderline counts. Cycle #5 was postponed on 10/29/2017 due to patient's wishes to be treated on Tuesdays only. Week #7 Taxol was held secondary to neutropenia (  ANC 200).  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  Normal labs on 11/24/2017 included:  B12, folate, TSH, and copper.  Right mammogram and ultrasound on 09/28/2017 revealed the 2 adjacent masses in the right breast at the 10:30 o'clock position had both decreased in size (1.8 x 1.1 x 1.2 cm to 1.2 x 0.7 x 1.1 cm; 11 x 9 x 9 mm to 7 x 6 x 6 mm) as well as the metastatic right axillary lymph node (2 .7 cm to 1.6 x 0.9 x 1.7 cm).    She was admitted to [facility] from 06/28/2017 - 07/01/2017 with with fever, cough and generalized weakness.  Nasal swab was + for influenza A.  She completed a course of Tamiflu.  Symptomatically, she is doing well.  Loose stools are at baseline.  Left foot neuropathy comes and goes.  Energy level has improved.  Weight is stable. Exam is  unremarkable. WBC 3500 ([place] 2100)  Plan: 1. Labs:  CBC with diff, CMP, Mg. 2. Breast cancer - treatment ongoing Clinically doing well. Labs reviewed.  Counts excellent.  Proceed with cycle #11 Taxol today. 3. 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  WBC 3500 ([place] 2100).   Based on previous cycles, patient requires ongoing GCSF support.  RTC tomorrow for GCSF. 4. 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  Continue to monitor. 5. Protein calorie nutrition - ongoing monitoring  Weight stable.  Patient eating better this week as food tastes better. 6.   RTC in 1 week for MD assessment, labs (CBC with diff, CMP, Mg), and cycle #12 Taxol.   Nolon Stalls, MD 12/29/2017, 4:37 PM

## 2017-12-29 NOTE — Progress Notes (Signed)
Patient offers no complaints today. 

## 2017-12-30 ENCOUNTER — Other Ambulatory Visit: Payer: Self-pay | Admitting: *Deleted

## 2017-12-30 ENCOUNTER — Inpatient Hospital Stay: Payer: Medicare Other

## 2017-12-30 ENCOUNTER — Other Ambulatory Visit: Payer: Self-pay | Admitting: Urgent Care

## 2017-12-30 DIAGNOSIS — R110 Nausea: Secondary | ICD-10-CM

## 2017-12-30 DIAGNOSIS — C50211 Malignant neoplasm of upper-inner quadrant of right female breast: Secondary | ICD-10-CM

## 2017-12-30 DIAGNOSIS — Z5111 Encounter for antineoplastic chemotherapy: Secondary | ICD-10-CM | POA: Diagnosis not present

## 2017-12-30 DIAGNOSIS — T451X5A Adverse effect of antineoplastic and immunosuppressive drugs, initial encounter: Principal | ICD-10-CM

## 2017-12-30 DIAGNOSIS — Z171 Estrogen receptor negative status [ER-]: Principal | ICD-10-CM

## 2017-12-30 MED ORDER — TBO-FILGRASTIM 300 MCG/0.5ML ~~LOC~~ SOSY
480.0000 ug | PREFILLED_SYRINGE | Freq: Once | SUBCUTANEOUS | Status: AC
  Administered 2017-12-30: 480 ug via SUBCUTANEOUS

## 2017-12-30 MED ORDER — LORAZEPAM 0.5 MG PO TABS: 0.5000 mg | ORAL_TABLET | Freq: Four times a day (QID) | ORAL | 0 refills | Status: DC | PRN

## 2018-01-05 ENCOUNTER — Inpatient Hospital Stay: Payer: Medicare Other

## 2018-01-05 ENCOUNTER — Inpatient Hospital Stay (HOSPITAL_BASED_OUTPATIENT_CLINIC_OR_DEPARTMENT_OTHER): Payer: Medicare Other | Admitting: Hematology and Oncology

## 2018-01-05 ENCOUNTER — Encounter: Payer: Self-pay | Admitting: Hematology and Oncology

## 2018-01-05 VITALS — BP 131/79 | HR 73 | Temp 97.7°F | Resp 18 | Wt 137.3 lb

## 2018-01-05 DIAGNOSIS — C50411 Malignant neoplasm of upper-outer quadrant of right female breast: Secondary | ICD-10-CM | POA: Diagnosis not present

## 2018-01-05 DIAGNOSIS — Z171 Estrogen receptor negative status [ER-]: Principal | ICD-10-CM

## 2018-01-05 DIAGNOSIS — T451X5A Adverse effect of antineoplastic and immunosuppressive drugs, initial encounter: Secondary | ICD-10-CM

## 2018-01-05 DIAGNOSIS — E876 Hypokalemia: Secondary | ICD-10-CM

## 2018-01-05 DIAGNOSIS — E119 Type 2 diabetes mellitus without complications: Secondary | ICD-10-CM

## 2018-01-05 DIAGNOSIS — Z5111 Encounter for antineoplastic chemotherapy: Secondary | ICD-10-CM | POA: Diagnosis not present

## 2018-01-05 DIAGNOSIS — D701 Agranulocytosis secondary to cancer chemotherapy: Secondary | ICD-10-CM

## 2018-01-05 DIAGNOSIS — C773 Secondary and unspecified malignant neoplasm of axilla and upper limb lymph nodes: Secondary | ICD-10-CM

## 2018-01-05 DIAGNOSIS — C50911 Malignant neoplasm of unspecified site of right female breast: Secondary | ICD-10-CM

## 2018-01-05 DIAGNOSIS — R197 Diarrhea, unspecified: Secondary | ICD-10-CM

## 2018-01-05 DIAGNOSIS — I10 Essential (primary) hypertension: Secondary | ICD-10-CM

## 2018-01-05 DIAGNOSIS — G620 Drug-induced polyneuropathy: Secondary | ICD-10-CM

## 2018-01-05 DIAGNOSIS — R110 Nausea: Secondary | ICD-10-CM

## 2018-01-05 DIAGNOSIS — Z87891 Personal history of nicotine dependence: Secondary | ICD-10-CM

## 2018-01-05 LAB — COMPREHENSIVE METABOLIC PANEL
ALT: 24 U/L (ref 0–44)
AST: 19 U/L (ref 15–41)
Albumin: 4.2 g/dL (ref 3.5–5.0)
Alkaline Phosphatase: 57 U/L (ref 38–126)
Anion gap: 9 (ref 5–15)
BUN: 11 mg/dL (ref 8–23)
CO2: 27 mmol/L (ref 22–32)
Calcium: 9.4 mg/dL (ref 8.9–10.3)
Chloride: 103 mmol/L (ref 98–111)
Creatinine, Ser: 0.7 mg/dL (ref 0.44–1.00)
GFR calc Af Amer: >60 mL/min (ref >60)
GFR calc non Af Amer: >60 mL/min (ref >60)
Glucose, Bld: 117 mg/dL — ABNORMAL HIGH (ref 70–99)
Potassium: 3.6 mmol/L (ref 3.5–5.1)
Sodium: 139 mmol/L (ref 135–145)
Total Bilirubin: 1 mg/dL (ref 0.3–1.2)
Total Protein: 6.3 g/dL — ABNORMAL LOW (ref 6.5–8.1)

## 2018-01-05 LAB — CBC WITH DIFFERENTIAL/PLATELET
Basophils Absolute: 0 10*3/uL (ref 0–0.1)
Basophils Relative: 1 %
Eosinophils Absolute: 0.1 10*3/uL (ref 0–0.7)
Eosinophils Relative: 2 %
HCT: 31.6 % — ABNORMAL LOW (ref 35.0–47.0)
Hemoglobin: 10.8 g/dL — ABNORMAL LOW (ref 12.0–16.0)
Lymphocytes Relative: 17 %
Lymphs Abs: 0.5 10*3/uL — ABNORMAL LOW (ref 1.0–3.6)
MCH: 31.8 pg (ref 26.0–34.0)
MCHC: 34.1 g/dL (ref 32.0–36.0)
MCV: 93.2 fL (ref 80.0–100.0)
Monocytes Absolute: 0.3 10*3/uL (ref 0.2–0.9)
Monocytes Relative: 9 %
Neutro Abs: 2.2 10*3/uL (ref 1.4–6.5)
Neutrophils Relative %: 71 %
Platelets: 166 10*3/uL (ref 150–440)
RBC: 3.39 MIL/uL — ABNORMAL LOW (ref 3.80–5.20)
RDW: 17.1 % — ABNORMAL HIGH (ref 11.5–14.5)
WBC: 3.2 10*3/uL — ABNORMAL LOW (ref 3.6–11.0)

## 2018-01-05 LAB — MAGNESIUM: Magnesium: 1.9 mg/dL (ref 1.7–2.4)

## 2018-01-05 MED ORDER — DIPHENHYDRAMINE HCL 50 MG/ML IJ SOLN
50.0000 mg | Freq: Once | INTRAMUSCULAR | Status: AC
  Administered 2018-01-05: 50 mg via INTRAVENOUS
  Filled 2018-01-05: qty 1

## 2018-01-05 MED ORDER — SODIUM CHLORIDE 0.9 % IV SOLN
20.0000 mg | Freq: Once | INTRAVENOUS | Status: AC
  Administered 2018-01-05: 20 mg via INTRAVENOUS
  Filled 2018-01-05: qty 2

## 2018-01-05 MED ORDER — SODIUM CHLORIDE 0.9% FLUSH
10.0000 mL | INTRAVENOUS | Status: DC | PRN
  Administered 2018-01-05: 10 mL via INTRAVENOUS
  Filled 2018-01-05: qty 10

## 2018-01-05 MED ORDER — SODIUM CHLORIDE 0.9 % IV SOLN
Freq: Once | INTRAVENOUS | Status: AC
  Administered 2018-01-05: 11:00:00 via INTRAVENOUS
  Filled 2018-01-05: qty 250

## 2018-01-05 MED ORDER — HEPARIN SOD (PORK) LOCK FLUSH 100 UNIT/ML IV SOLN
500.0000 [IU] | Freq: Once | INTRAVENOUS | Status: AC
  Administered 2018-01-05: 500 [IU] via INTRAVENOUS
  Filled 2018-01-05: qty 5

## 2018-01-05 MED ORDER — FAMOTIDINE IN NACL 20-0.9 MG/50ML-% IV SOLN
20.0000 mg | Freq: Once | INTRAVENOUS | Status: AC
  Administered 2018-01-05: 20 mg via INTRAVENOUS
  Filled 2018-01-05: qty 50

## 2018-01-05 MED ORDER — SODIUM CHLORIDE 0.9 % IV SOLN
80.0000 mg/m2 | Freq: Once | INTRAVENOUS | Status: AC
  Administered 2018-01-05: 150 mg via INTRAVENOUS
  Filled 2018-01-05: qty 25

## 2018-01-05 NOTE — Progress Notes (Signed)
Pt in for follow up, denies any difficulties or concerns in last week.

## 2018-01-05 NOTE — Progress Notes (Signed)
[facility] day:  01/05/2018   Chief Complaint: Stephanie Sanders is a 72 y.o. female with multi-focal right breast cancer who is seen for assessment prior to week #12 Taxol.   HPI:  The patient was last seen in the medical oncology clinic on 12/29/2017.  At that time, she felt "alright'.  She described intermittent loose stools.  She had decreased her magnesium glycinate supplement to 1 pill/day.  She was not taking Welchol secondary to issues with constipation.  She noted off and on numbness in her fingers.  WBC was 3500 with an ANC of 2100.  She received week #11 Taxol.  She received GCSF on 12/30/2017.  During the interim, patient feels "pretty good this morning". Patient did not feel as well following her last cycle, however attributes this to "overdoing it". 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 Neuropathy is not limiting function. She denies any significant nausea or vomiting. Patient denies that she has experienced any B symptoms. She continues to experience loose bowels. Plans to introduce Welchol again following chemotherapy completion.  She denies any interval infections.   Patient has some hair regrowth (short white).   Patient advises that she maintains an adequate appetite. She is eating well. Weight today is 137 lb 5 oz (62.3 kg), which compared to her last visit to the clinic, represents a 1 pound decrease.   Patient denies pain in the clinic today.   Past Medical History:  Diagnosis Date  . Cancer ([place])   . Diabetes mellitus without complication ([place])   . Diabetic nephropathy ([place])   . Headache    migraines  . Heart murmur    ASYMPTOMATIC  . Hypertension   . Hypothyroidism   . Personal history of chemotherapy 2019   right breast cancer  . Seborrheic keratosis   . Vitamin D deficiency     Past Surgical History:  Procedure Laterality Date  . APPENDECTOMY    . BREAST BIOPSY Right 05/28/2017   right  breast bx 3 areas invasive mamm ca lymph node mets  . BREAST CYST ASPIRATION Bilateral    neg  . CHOLECYSTECTOMY    . COLONOSCOPY WITH PROPOFOL N/A 01/01/2015   Procedure: COLONOSCOPY WITH PROPOFOL;  Surgeon: Josefine Class, MD;  Location: [facility] ENDOSCOPY;  Service: Endoscopy;  Laterality: N/A;  . EYE SURGERY     eyelid  . PORTACATH PLACEMENT Right 06/10/2017   Procedure: INSERTION PORT-A-CATH;  Surgeon: Herbert Pun, MD;  Location: [facility] ORS;  Service: General;  Laterality: Right;  . STENT PLACEMENT ILIAC ([place] HX)    . TUBAL LIGATION      Family History  Problem Relation Age of Onset  . Cancer Mother   . Cancer Maternal Aunt   . Breast cancer Neg Hx     Social History:  reports that she quit smoking about 45 years ago. Her smoking use included cigarettes. She has a 20.00 pack-year smoking history. She has never used smokeless tobacco. She reports that she does not drink alcohol or use drugs.  She has 3 children (daughters: age 68 and 71; son Jaquelyn Bitter): age 62).  She is retired.  She worked for the [employer].  Her husband has dementia.  She lives in [place].  She is accompanied by her friend, Jenny Reichmann, today.  Allergies:  Allergies  Allergen Reactions  . Fiorinal [Butalbital-Aspirin-Caffeine] Nausea And Vomiting and Other (See Comments)    SEVERE HEADACHE  . Sulfa Antibiotics Other (See  Comments)    Burning esophagus and stomach  . Tramadol Nausea And Vomiting  . Other Rash    STERI-STRIPS-RASH STERI-STRIPS-RASH    Current Medications: Current Outpatient Medications  Medication Sig Dispense Refill  . acetaminophen (TYLENOL) 500 MG tablet Take 1,000 mg by mouth every 6 (six) hours as needed (for headaches.).    Marland Kitchen atorvastatin (LIPITOR) 10 MG tablet Take 10 mg by mouth every evening.     . Blood Glucose Monitoring Suppl (FIFTY50 GLUCOSE METER 2.0) w/Device KIT One Touch Ultra Meter Use as instructed.    . Cholecalciferol 2000 UNITS CAPS Take 4,000 Units by mouth daily.      . colesevelam (WELCHOL) 625 MG tablet Take 1,875 mg by mouth every evening.     Marland Kitchen dexamethasone (DECADRON) 4 MG tablet Take 2 tablets by mouth once a day on the day after chemotherapy and then take 2 tablets two times a day for 2 days. Take with food. 30 tablet 1  . levothyroxine (SYNTHROID, LEVOTHROID) 50 MCG tablet Take 50 mcg by mouth daily before breakfast.    . lidocaine-prilocaine (EMLA) cream Apply 1 application topically as needed. 30 g 6  . loratadine (CLARITIN) 10 MG tablet Take 10 mg by mouth daily.    Marland Kitchen LORazepam (ATIVAN) 0.5 MG tablet Take 1 tablet (0.5 mg total) by mouth every 6 (six) hours as needed (Nausea or vomiting). 30 tablet 0  . MAGNESIUM GLYCINATE PLUS PO Take 100 mg by mouth.    . Melatonin 5 MG TABS Take 5 mg by mouth at bedtime as needed (for sleep.).    Marland Kitchen metFORMIN (GLUCOPHAGE) 1000 MG tablet Take 1,000 mg by mouth 2 (two) times daily.    . polyethylene glycol (MIRALAX / GLYCOLAX) packet Take 17 g by mouth daily.    . quinapril (ACCUPRIL) 5 MG tablet Take 5 mg by mouth every morning.     . sodium bicarbonate 650 MG tablet Take 1,300 mg by mouth at bedtime.    Marland Kitchen aspirin-acetaminophen-caffeine (EXCEDRIN MIGRAINE) 250-250-65 MG tablet Take 2 tablets by mouth every 6 (six) hours as needed for headache.    . nystatin (MYCOSTATIN) 100000 UNIT/ML suspension Take 5 mLs (500,000 Units total) by mouth 4 (four) times daily. (Patient not taking: Reported on 12/22/2017) 240 mL 0  . ondansetron (ZOFRAN) 8 MG tablet Take 1 tablet (8 mg total) by mouth 2 (two) times daily as needed. Start on the third day after chemotherapy. (Patient not taking: Reported on 12/22/2017) 30 tablet 1   No current facility-administered medications for this visit.    Facility-Administered Medications Ordered in Other Visits  Medication Dose Route Frequency Provider Last Rate Last Dose  . heparin lock flush 100 unit/mL  500 Units Intravenous Once Corcoran, Melissa C, MD      . sodium chloride flush (NS) 0.9 %  injection 10 mL  10 mL Intravenous PRN Lequita Asal, MD   10 mL at 10/21/17 0841  . sodium chloride flush (NS) 0.9 % injection 10 mL  10 mL Intravenous PRN Lequita Asal, MD   10 mL at 01/05/18 6440    Review of Systems  Constitutional: Positive for malaise/fatigue (following treatment) and weight loss (down 1 pound). Negative for diaphoresis and fever.       "I feel pretty good today"  HENT: Negative.        Dysgeusia - improved some  Eyes: Negative.   Respiratory: Negative for cough, hemoptysis, sputum production and shortness of breath.   Cardiovascular: Negative  for chest pain, palpitations, orthopnea, leg swelling and PND.  Gastrointestinal: Positive for diarrhea (chronic) and nausea (controlled). Negative for abdominal pain, blood in stool, constipation, melena and vomiting.  Genitourinary: Negative for dysuria, frequency, hematuria and urgency.  Musculoskeletal: Negative for back pain, falls, joint pain and myalgias.  Skin: Negative for itching and rash.  Neurological: Positive for sensory change (neuropathy (stable) in LEFT hand and foot). Negative for dizziness, tremors, weakness and headaches.  Endo/Heme/Allergies: Does not bruise/bleed easily.       Diabetes on Metformin  Psychiatric/Behavioral: Negative for depression, memory loss and suicidal ideas. The patient is not nervous/anxious and does not have insomnia.   All other systems reviewed and are negative.  Performance status (ECOG): 1 - Symptomatic but completely ambulatory  Vital signs BP 131/79 (BP Location: Left Arm, Patient Position: Sitting)   Pulse 73   Temp 97.7 F (36.5 C) (Tympanic)   Resp 18   Wt 137 lb 5 oz (62.3 kg)   BMI 20.88 kg/m   Physical Exam  Constitutional: She is oriented to person, place, and time and well-developed, well-nourished, and in no distress.  HENT:  Head: Normocephalic and atraumatic. Hair is abnormal (chemotherapy induced alopecia. Some short white hair regrowth.).   Wearing a black cap.  Eyes: Pupils are equal, round, and reactive to light. EOM are normal. No scleral icterus.  Gold rimmed glasses.  Blue eyes.  Neck: Normal range of motion. Neck supple. No tracheal deviation present. No thyromegaly present.  Cardiovascular: Normal rate, regular rhythm and normal heart sounds. Exam reveals no gallop and no friction rub.  No murmur heard. Pulmonary/Chest: Effort normal and breath sounds normal. No respiratory distress. She has no wheezes. She has no rales.  Abdominal: Soft. Bowel sounds are normal. She exhibits no distension. There is no tenderness.  Musculoskeletal: Normal range of motion. She exhibits no edema or tenderness.  Lymphadenopathy:    She has no cervical adenopathy.    She has no axillary adenopathy.       Right: No inguinal and no supraclavicular adenopathy present.       Left: No inguinal and no supraclavicular adenopathy present.  Neurological: She is alert and oriented to person, place, and time.  Skin: Skin is warm and dry. No rash noted. No erythema.  Psychiatric: Mood, affect and judgment normal.  Nursing note and vitals reviewed.   Infusion on 01/05/2018  Component Date Value Ref Range Status  . Magnesium 01/05/2018 1.9  1.7 - 2.4 mg/dL Final   Performed at [facility], [address] [place], [place] [address]  . Sodium 01/05/2018 139  135 - 145 mmol/L Final  . Potassium 01/05/2018 3.6  3.5 - 5.1 mmol/L Final  . Chloride 01/05/2018 103  98 - 111 mmol/L Final  . CO2 01/05/2018 27  22 - 32 mmol/L Final  . Glucose, Bld 01/05/2018 117* 70 - 99 mg/dL Final  . BUN 01/05/2018 11  8 - 23 mg/dL Final  . Creatinine, Ser 01/05/2018 0.70  0.44 - 1.00 mg/dL Final  . Calcium 01/05/2018 9.4  8.9 - 10.3 mg/dL Final  . Total Protein 01/05/2018 6.3* 6.5 - 8.1 g/dL Final  . Albumin 01/05/2018 4.2  3.5 - 5.0 g/dL Final  . AST 01/05/2018 19  15 - 41 U/L Final  . ALT 01/05/2018 24  0 - 44 U/L Final  . Alkaline Phosphatase 01/05/2018  57  38 - 126 U/L Final  . Total Bilirubin 01/05/2018 1.0  0.3 - 1.2 mg/dL Final  .  GFR calc non Af Amer 01/05/2018 >60  >60 mL/min Final  . GFR calc Af Amer 01/05/2018 >60  >60 mL/min Final   Comment: (NOTE) The eGFR has been calculated using the CKD EPI equation. This calculation has not been validated in all clinical situations. eGFR's persistently <60 mL/min signify possible Chronic Kidney Disease.   Georgiann Hahn gap 01/05/2018 9  5 - 15 Final   Performed at [facility], [address] [place], [place] [address]  . WBC 01/05/2018 3.2* 3.6 - 11.0 K/uL Final  . RBC 01/05/2018 3.39* 3.80 - 5.20 MIL/uL Final  . Hemoglobin 01/05/2018 10.8* 12.0 - 16.0 g/dL Final  . HCT 01/05/2018 31.6* 35.0 - 47.0 % Final  . MCV 01/05/2018 93.2  80.0 - 100.0 fL Final  . MCH 01/05/2018 31.8  26.0 - 34.0 pg Final  . MCHC 01/05/2018 34.1  32.0 - 36.0 g/dL Final  . RDW 01/05/2018 17.1* 11.5 - 14.5 % Final  . Platelets 01/05/2018 166  150 - 440 K/uL Final  . Neutrophils Relative % 01/05/2018 71  % Final  . Neutro Abs 01/05/2018 2.2  1.4 - 6.5 K/uL Final  . Lymphocytes Relative 01/05/2018 17  % Final  . Lymphs Abs 01/05/2018 0.5* 1.0 - 3.6 K/uL Final  . Monocytes Relative 01/05/2018 9  % Final  . Monocytes Absolute 01/05/2018 0.3  0.2 - 0.9 K/uL Final  . Eosinophils Relative 01/05/2018 2  % Final  . Eosinophils Absolute 01/05/2018 0.1  0 - 0.7 K/uL Final  . Basophils Relative 01/05/2018 1  % Final  . Basophils Absolute 01/05/2018 0.0  0 - 0.1 K/uL Final   Performed at [facility], [address] [place], [place] [address]    Assessment:  LIBNI FUSARO is a 72 y.o. 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  Pathology revealed two grade II invasive mammary carcinomas of no special type (4 cm from the nipple and 5 cm from the nipple).  Tumors were in close proximity.  Lymph node biopsy confirmed metastatic carcinoma of breast  origin.  Tumor is ER negative (< 1%), PR negative and Her 2/neu 2+ by IHC.  [facility] genetic testing on 07/21/2017 revealed a variant of uncertain significance in RECQL4.  Right sided mammogram and ultrasound on 05/21/2017 revealed 2 highly suspicious masses in the right breast at the 10:30 position 4 cm from nipple (1.8 x 1.2 x 1.1 cm) and at the 10:30 position 5 cm from nipple (1.1 x 0.9 x 0.9 cm).  There was a suspicious 2.7 x 1 x 2.2 cm lymph node in the right axilla with asymmetric nodular cortical thickening.  Ultrasound guided biopsy of the 2 masses and lymph node on 05/28/2017 revealed grade II invasive mammary carcinoma of no special type (4 cm from the nipple and 5 cm from the nipple).  Lymph node biopsy revealed metastatic carcinoma of breast origin.  Tumor is ER negative (< 1%), PR negative and Her 2/neu 2+ by IHC.  FISH was negative.  CA 27.29 was 16.5 and CA15-3 was 17.6 on 06/04/2017.  Echo on 06/09/2017 revealed an EF of 60-65%.  She has enrolled on the UPBEAT clinical trial.  She received 4 cycles of AC (06/23/2017 - 09/08/2017) with Udencya support.  Cycle #1 was complicated by influenza A.  Nadir counts included an ANC of 0 and platelet count of 71,000. Cycle #2 postponed due to slow recovery from influenza and excisional cyst removal with  surrounding cellulitis (treated with 10 days of doxycycline).   She is s/p week #11 Taxol (09/29/2017 - 11/10/2017; 11/24/2017 - 12/08/2017; 12/22/2017 - 12/29/2017).  She received GCSF after week #3 and 5 Taxol secondary to borderline counts. Cycle #5 was postponed on 10/29/2017 due to patient's wishes to be treated on Tuesdays only. Week #7 Taxol was held secondary to neutropenia (ANC 200).  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  Normal labs on 11/24/2017 included:  B12, folate, TSH, and copper.  Right mammogram and ultrasound on 09/28/2017 revealed the 2 adjacent masses in the right breast at the 10:30 o'clock position had both decreased  in size (1.8 x 1.1 x 1.2 cm to 1.2 x 0.7 x 1.1 cm; 11 x 9 x 9 mm to 7 x 6 x 6 mm) as well as the metastatic right axillary lymph node (2.7 cm to 1.6 x 0.9 x 1.7 cm).    She was [facility] to [facility] from [facility]/28/2017 - 07/01/2017 with with fever, cough and generalized weakness.  Nasal swab was + for influenza A.  She completed a course of Tamiflu.  Symptomatically, she feels "pretty good today".  She notes fatigue following her last treatment, but attributes this to her "over doing it". Bowels are unchanged. She continues to have chronically loose stools.  Plans to restart Welchol regiment when chemotherapy has completed. Patient denies that she has experienced any B symptoms. She denies any interval infections. Exam is stable. WBC 3200 ([place] 2200).   Plan: 1. Labs today:  CBC with diff, CMP, Mg. 2. Breast cancer  Doing well. Tolerating treatments with minimal side effects.   Labs reviewed. Blood counts stable and adequate enough for treatment. Will proceed with cycle #12 Taxol today. This is her final treatment.   Discuss follow-up with surgery for planned lumpectomy. Anticipate date of surgery to be approximately 3 weeks following today's treatment.   Anticipate follow-up with medical oncology 1-2 weeks after surgery for review of pathology. Discuss symptom management.  Patient has antiemetics and pain medications at home to use on a PRN basis. Patient  advising that the  prescribed interventions are adequate at this point. Continue all medications as previously prescribed.  3. Diarrhea  Ongoing issue. Plans to restart Welchol after completion of chemotherapy treatments.  4. Chemotherapy induced neutropenia  Pretreatment counts stable.   Discuss need for GCSF on 01/06/2018 in order to maintain adequate counts based on past post-treatment blood count trends.  5. Chemotherapy induced neuropathy  Stable symptoms only in LEFT hand and foot. No functional limitations.   Continue to monitor.   6. Hypokalemia   Potassium stable at 3.6. No need for supplementation at this time.   Encouraged potassium rich foods and beverages to prevent dehydration and electrolyte derangements in the setting of her chronic loose stools. 7. Schedule follow-up with surgery. 8. RTC 1-2 weeks after surgery for MD assessment and discussion regarding direction of therapy.   Honor Loh, NP 01/05/2018,10:24 AM   I saw and evaluated the patient, participating in the key portions of the service and reviewing pertinent diagnostic studies and records.  I reviewed the nurse practitioner's note and agree with the findings and the plan.  The assessment and plan were discussed with the patient.  Multiple questions were asked by the patient and answered.   Nolon Stalls, MD 01/05/2018,10:24 AM

## 2018-01-06 ENCOUNTER — Inpatient Hospital Stay: Payer: Medicare Other

## 2018-01-06 DIAGNOSIS — R110 Nausea: Secondary | ICD-10-CM

## 2018-01-06 DIAGNOSIS — T451X5A Adverse effect of antineoplastic and immunosuppressive drugs, initial encounter: Principal | ICD-10-CM

## 2018-01-06 DIAGNOSIS — Z5111 Encounter for antineoplastic chemotherapy: Secondary | ICD-10-CM | POA: Diagnosis not present

## 2018-01-06 MED ORDER — TBO-FILGRASTIM 480 MCG/0.8ML ~~LOC~~ SOSY
480.0000 ug | PREFILLED_SYRINGE | Freq: Once | SUBCUTANEOUS | Status: AC
  Administered 2018-01-06: 480 ug via SUBCUTANEOUS

## 2018-01-15 ENCOUNTER — Ambulatory Visit: Payer: Self-pay | Admitting: General Surgery

## 2018-01-15 ENCOUNTER — Other Ambulatory Visit: Payer: Self-pay | Admitting: General Surgery

## 2018-01-15 DIAGNOSIS — C50911 Malignant neoplasm of unspecified site of right female breast: Secondary | ICD-10-CM

## 2018-01-15 DIAGNOSIS — C773 Secondary and unspecified malignant neoplasm of axilla and upper limb lymph nodes: Principal | ICD-10-CM

## 2018-01-15 NOTE — H&P (Signed)
HISTORY OF PRESENT ILLNESS:    Stephanie Sanders is a 72 y.o.female patient who comes for follow up of her personal history of breast cancer.  Patient had her routine screening mammography on 05/14/17 were it was found to have a suspicious lesion. Then, patient has a diagnostic mammography and a biopsy of the lesions and it was found with invasive mammary cancer. On the images it was found to have an enlarged lymph node and it was found to have malignant cells from breast cancer. Patient was evaluated by me at that time and recommended Needle guided partial mastectomy with axillary lymph node biopsy. The patient was seen by Oncologist and discussed the alternative with patient and decided to proceed with neoadjuvant therapy "with the goal to try to avoid axillary node dissection".   The patient underwent neoadjuvant therapy and finished therapy on 01/05/18. Refers she is feeling good and getting better after the chemotherapy. During the course of the chemotherapy she had an ultrasound done that shows no progression of the masses. Minimal decrease in size. Personally evaluated the images.   Patient refers menarche at 25 years and last menstrual cycle at 72 years old.  No family history diagnosed of breast or ovarian cancer No radiation to chest Patient had estrogen therapy for less than a year Used birth control pill First pregnancy at 72 years old (3 pregnancies in total)    PAST MEDICAL HISTORY:      Past Medical History:  Diagnosis Date  . Diabetic nephropathy (CMS-HCC) 1999  . Heart murmur, unspecified 07/2012   normal EF, mod MR, mild to mod TR, mild dilation of left and right atrium  . History of migraine   . Hypertension   . Seborrheic keratosis   . Serrated adenoma of colon, unspecified 01/01/2015  . Thyroid disease   . Vitamin D deficiency         PAST SURGICAL HISTORY:        Past Surgical History:  Procedure Laterality Date  . APPENDECTOMY  1980  . CHOLECYSTECTOMY   2011   stent placed due to bile leak  . COLONOSCOPY  01/01/2015   Serrated adenoma of colon/Repeat 30yr/MGR  . EYELID SURGERY  2006  . TUBAL LIGATION           MEDICATIONS:  EncounterMedications        Outpatient Encounter Medications as of 01/15/2018  Medication Sig Dispense Refill  . acetaminophen (TYLENOL) 500 MG tablet Take 500 mg by mouth    . aspirin-acetaminophen-caffeine (EXCEDRIN MIGRAINE) 250-250-65 mg per tablet Take 65 mg by mouth    . atorvastatin (LIPITOR) 10 MG tablet TAKE 1 TABLET BY MOUTH ONCE DAILY 30 tablet 0  . blood glucose diagnostic (ONETOUCH ULTRA TEST) test strip Use 1 each once daily. Use as instructed. 100 each 3  . blood glucose meter kit kit One Touch Ultra Meter Use as instructed. 1 each 0  . cholecalciferol (VITAMIN D3) 2,000 unit capsule Take 4,000 Units by mouth daily.     . colesevelam (WELCHOL) 625 mg tablet TAKE 3 TABLETS BY MOUTH ONCE DAILY 90 tablet 0  . lidocaine-prilocaine (EMLA) cream Apply 1 application topically as needed.    .Marland KitchenLORazepam (ATIVAN) 0.5 MG tablet Take 0.5 mg by mouth as needed    . melatonin 5 mg Tab Take 5 mg by mouth as needed    . metFORMIN (GLUCOPHAGE) 500 MG tablet Take 1 tablet (500 mg total) by mouth 2 (two) times daily with meals 60 tablet 2  .  nystatin (MYCOSTATIN) 100,000 unit/gram cream Apply topically 2 (two) times daily 15 g 2  . ondansetron (ZOFRAN) 8 MG tablet Take 8 mg by mouth as needed    . quinapril (ACCUPRIL) 5 MG tablet TAKE 1 TABLET BY MOUTH ONCE DAILY 30 tablet 0  . sodium bicarbonate 650 MG tablet Take 650 mg by mouth    . SYNTHROID 50 mcg tablet TAKE 1 TABLET BY MOUTH EVERY MORNING ON AN EMPTY STOMACH 30 tablet 5  . [DISCONTINUED] metFORMIN (GLUCOPHAGE) 500 MG tablet Take 1 tablet (500 mg total) by mouth 2 (two) times daily with meals 60 tablet 2   No facility-administered encounter medications on file as of 01/15/2018.        ALLERGIES:   Fiorinal  [butalbital-aspirin-caffeine]; Other; Septra [sulfamethoxazole-trimethoprim]; Sulfa (sulfonamide antibiotics); and Tramadol   SOCIAL HISTORY:  Social History          Socioeconomic History  . Marital status: Married    Spouse name: Stephanie Sanders  . Number of children: 3  . Years of education: 86  . Highest education level: Not on file  Occupational History  . Occupation: Retired-[employer] job  Social Needs  . Financial resource strain: Not on file  . Food insecurity:    Worry: Not on file    Inability: Not on file  . Transportation needs:    Medical: Not on file    Non-medical: Not on file  Tobacco Use  . Smoking status: Former Smoker    Packs/day: 2.00    Years: 10.00    Pack years: 20.00    Types: Cigarettes    Last attempt to quit: 04/22/1975    Years since quitting: 42.7  . Smokeless tobacco: Never Used  . Tobacco comment: stopped 36 years ago  Substance and Sexual Activity  . Alcohol use: No    Alcohol/week: 0.0 oz    Comment: stopped 1996  . Drug use: No  . Sexual activity: Yes    Partners: Male  Other Topics Concern  . Not on file  Social History Narrative  . Not on file      FAMILY HISTORY:       Family History  Problem Relation Age of Onset  . Coronary Artery Disease (Blocked arteries around heart) Father 55       tobacco use, poor diet  . Lung cancer Father   . Pancreatic cancer Father   . Ovarian cancer Maternal Aunt 40  . Diabetes type II Mother   . Parkinsonism Mother   . Thyroid disease Mother   . Diabetes type II Brother   . Stroke Paternal Aunt   . Diabetes type II Paternal Grandmother   . Stroke Paternal Grandfather   . Lung cancer Maternal Aunt   . Breast cancer Neg Hx   . Osteoporosis (Thinning of bones) Neg Hx      GENERAL REVIEW OF SYSTEMS:   General ROS: negative for - chills, fatigue, fever, weight gain or weight loss Allergy and Immunology ROS: negative for - hives   Hematological and Lymphatic ROS: negative for - bleeding problems or bruising, negative for palpable nodes Endocrine ROS: negative for - heat or cold intolerance, hair changes Respiratory ROS: negative for - cough, shortness of breath or wheezing Cardiovascular ROS: no chest pain or palpitations GI ROS: negative for nausea, vomiting, abdominal pain, diarrhea, constipation Musculoskeletal ROS: negative for - joint swelling or muscle pain Neurological ROS: negative for - confusion, syncope Dermatological ROS: negative for pruritus and rash  PHYSICAL EXAM:  Vitals:   01/15/18 0950  BP: 120/68  Pulse: 79  Temp: 36.7 C (98 F)   Estimated body mass index is 22.95 kg/m as calculated from the following:   Height as of this encounter: 170.8 cm (5' 7.25").   Weight as of this encounter: 67 kg (147 lb 9.6 oz).    GENERAL: Alert, active, oriented x3  HEENT: Pupils equal reactive to light. Extraocular movements are intact. Sclera clear. Palpebral conjunctiva normal red color.Pharynx clear.  NECK: Supple with no palpable mass and no adenopathy.  LUNGS: Sound clear with no rales rhonchi or wheezes.  HEART: Regular rhythm S1 and S2 without murmur.  BREAST: bilateral breast examination done with patient on supine and sitting position. Both breast palpated and no masses were identified. There are no skin changes or retraction. No axillary node palpated. No nipple retraction, no nipple discharge.  ABDOMEN: Soft and depressible, nontender with no palpable mass, no hepatomegaly.   EXTREMITIES: Well-developed well-nourished symmetrical with no dependent edema.  NEUROLOGICAL: Awake alert oriented, facial expression symmetrical, moving all extremities.      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  - neutropenia  - Last Mammogram done 4 months ago. Patient needs Diagnostic mammogram and ultrasound at the completion of the therapy and since she is almost a year from the screening mammogram, will perform bilateral mammogram. Patient very anxious about her left breast.   - Patient was oriented about surgical alternatives. Regarding the local breast mass patient wants breast conservative therapy. Regarding the axillary management patient already clinically positive with a biopsy proven metastatic breast cancer to node. I oriented the patient that the only way to avoid axillary node dissection is that with 2 tracers, I can identify 3 sentinel lymph nodes (plus the marked lymph node if it is not one of the sentinel lymph node) and that if they are negative. If one of the lymph node is positive, discussion will be done between me and the Oncologist about completion of axillary node dissection vs more chemotherapy or both. She refers and understood.   - Patient also with a medial right breast mass increasing in size that may be removed on the same surgery. The mass is in the same place as a previous cyst removal. Most likely recurrent benign cyst.  - In summary patient agreed to have right breast needle guided partial mastectomy with sentinel lymph node biopsy vs axillary node dissection and removal of right medial breast soft tissue mass.            PLAN:  1. Right breast needle guided partial mastectomy with sentinel lymph node biopsy vs axillary node dissection (19301 vs 19302, 38900, 38525, 11402) 2. CBC, CMP 3. Internal Medicine clearance (Dr. Priscella Mann) 4. Avoid aspirin 5 days before surgery  Patient and her husband verbalized understanding, all questions were answered, and were agreeable with the plan outlined above.   This was a 45 minute encounter, more than 50% in counseling and coordinating plan of care.   Herbert Pun, MD  Electronically signed by Herbert Pun, MD

## 2018-01-15 NOTE — H&P (View-Only) (Signed)
HISTORY OF PRESENT ILLNESS:    Stephanie Sanders is a 72 y.o.female patient who comes for follow up of her personal history of breast cancer.  Patient had her routine screening mammography on 05/14/17 were it was found to have a suspicious lesion. Then, patient has a diagnostic mammography and a biopsy of the lesions and it was found with invasive mammary cancer. On the images it was found to have an enlarged lymph node and it was found to have malignant cells from breast cancer. Patient was evaluated by me at that time and recommended Needle guided partial mastectomy with axillary lymph node biopsy. The patient was seen by Oncologist and discussed the alternative with patient and decided to proceed with neoadjuvant therapy "with the goal to try to avoid axillary node dissection".   The patient underwent neoadjuvant therapy and finished therapy on 01/05/18. Refers she is feeling good and getting better after the chemotherapy. During the course of the chemotherapy she had an ultrasound done that shows no progression of the masses. Minimal decrease in size. Personally evaluated the images.   Patient refers menarche at 12 years and last menstrual cycle at 72 years old.  No family history diagnosed of breast or ovarian cancer No radiation to chest Patient had estrogen therapy for less than a year Used birth control pill First pregnancy at 72 years old (3 pregnancies in total)    PAST MEDICAL HISTORY:      Past Medical History:  Diagnosis Date  . Diabetic nephropathy (CMS-HCC) 1999  . Heart murmur, unspecified 07/2012   normal EF, mod MR, mild to mod TR, mild dilation of left and right atrium  . History of migraine   . Hypertension   . Seborrheic keratosis   . Serrated adenoma of colon, unspecified 01/01/2015  . Thyroid disease   . Vitamin D deficiency         PAST SURGICAL HISTORY:        Past Surgical History:  Procedure Laterality Date  . APPENDECTOMY  1980  . CHOLECYSTECTOMY   2011   stent placed due to bile leak  . COLONOSCOPY  01/01/2015   Serrated adenoma of colon/Repeat 5yrs/MGR  . EYELID SURGERY  2006  . TUBAL LIGATION           MEDICATIONS:  EncounterMedications        Outpatient Encounter Medications as of 01/15/2018  Medication Sig Dispense Refill  . acetaminophen (TYLENOL) 500 MG tablet Take 500 mg by mouth    . aspirin-acetaminophen-caffeine (EXCEDRIN MIGRAINE) 250-250-65 mg per tablet Take 65 mg by mouth    . atorvastatin (LIPITOR) 10 MG tablet TAKE 1 TABLET BY MOUTH ONCE DAILY 30 tablet 0  . blood glucose diagnostic (ONETOUCH ULTRA TEST) test strip Use 1 each once daily. Use as instructed. 100 each 3  . blood glucose meter kit kit One Touch Ultra Meter Use as instructed. 1 each 0  . cholecalciferol (VITAMIN D3) 2,000 unit capsule Take 4,000 Units by mouth daily.     . colesevelam (WELCHOL) 625 mg tablet TAKE 3 TABLETS BY MOUTH ONCE DAILY 90 tablet 0  . lidocaine-prilocaine (EMLA) cream Apply 1 application topically as needed.    . LORazepam (ATIVAN) 0.5 MG tablet Take 0.5 mg by mouth as needed    . melatonin 5 mg Tab Take 5 mg by mouth as needed    . metFORMIN (GLUCOPHAGE) 500 MG tablet Take 1 tablet (500 mg total) by mouth 2 (two) times daily with meals 60 tablet 2  .   nystatin (MYCOSTATIN) 100,000 unit/gram cream Apply topically 2 (two) times daily 15 g 2  . ondansetron (ZOFRAN) 8 MG tablet Take 8 mg by mouth as needed    . quinapril (ACCUPRIL) 5 MG tablet TAKE 1 TABLET BY MOUTH ONCE DAILY 30 tablet 0  . sodium bicarbonate 650 MG tablet Take 650 mg by mouth    . SYNTHROID 50 mcg tablet TAKE 1 TABLET BY MOUTH EVERY MORNING ON AN EMPTY STOMACH 30 tablet 5  . [DISCONTINUED] metFORMIN (GLUCOPHAGE) 500 MG tablet Take 1 tablet (500 mg total) by mouth 2 (two) times daily with meals 60 tablet 2   No facility-administered encounter medications on file as of 01/15/2018.        ALLERGIES:   Fiorinal  [butalbital-aspirin-caffeine]; Other; Septra [sulfamethoxazole-trimethoprim]; Sulfa (sulfonamide antibiotics); and Tramadol   SOCIAL HISTORY:  Social History          Socioeconomic History  . Marital status: Married    Spouse name: Warren  . Number of children: 3  . Years of education: 16  . Highest education level: Not on file  Occupational History  . Occupation: Retired-[employer] job  Social Needs  . Financial resource strain: Not on file  . Food insecurity:    Worry: Not on file    Inability: Not on file  . Transportation needs:    Medical: Not on file    Non-medical: Not on file  Tobacco Use  . Smoking status: Former Smoker    Packs/day: 2.00    Years: 10.00    Pack years: 20.00    Types: Cigarettes    Last attempt to quit: 04/22/1975    Years since quitting: 42.7  . Smokeless tobacco: Never Used  . Tobacco comment: stopped 36 years ago  Substance and Sexual Activity  . Alcohol use: No    Alcohol/week: 0.0 oz    Comment: stopped 1996  . Drug use: No  . Sexual activity: Yes    Partners: Male  Other Topics Concern  . Not on file  Social History Narrative  . Not on file      FAMILY HISTORY:       Family History  Problem Relation Age of Onset  . Coronary Artery Disease (Blocked arteries around heart) Father 55       tobacco use, poor diet  . Lung cancer Father   . Pancreatic cancer Father   . Ovarian cancer Maternal Aunt 55  . Diabetes type II Mother   . Parkinsonism Mother   . Thyroid disease Mother   . Diabetes type II Brother   . Stroke Paternal Aunt   . Diabetes type II Paternal Grandmother   . Stroke Paternal Grandfather   . Lung cancer Maternal Aunt   . Breast cancer Neg Hx   . Osteoporosis (Thinning of bones) Neg Hx      GENERAL REVIEW OF SYSTEMS:   General ROS: negative for - chills, fatigue, fever, weight gain or weight loss Allergy and Immunology ROS: negative for - hives   Hematological and Lymphatic ROS: negative for - bleeding problems or bruising, negative for palpable nodes Endocrine ROS: negative for - heat or cold intolerance, hair changes Respiratory ROS: negative for - cough, shortness of breath or wheezing Cardiovascular ROS: no chest pain or palpitations GI ROS: negative for nausea, vomiting, abdominal pain, diarrhea, constipation Musculoskeletal ROS: negative for - joint swelling or muscle pain Neurological ROS: negative for - confusion, syncope Dermatological ROS: negative for pruritus and rash  PHYSICAL EXAM:       Vitals:   01/15/18 0950  BP: 120/68  Pulse: 79  Temp: 36.7 C (98 F)   Estimated body mass index is 22.95 kg/m as calculated from the following:   Height as of this encounter: 170.8 cm (5' 7.25").   Weight as of this encounter: 67 kg (147 lb 9.6 oz).    GENERAL: Alert, active, oriented x3  HEENT: Pupils equal reactive to light. Extraocular movements are intact. Sclera clear. Palpebral conjunctiva normal red color.Pharynx clear.  NECK: Supple with no palpable mass and no adenopathy.  LUNGS: Sound clear with no rales rhonchi or wheezes.  HEART: Regular rhythm S1 and S2 without murmur.  BREAST: bilateral breast examination done with patient on supine and sitting position. Both breast palpated and no masses were identified. There are no skin changes or retraction. No axillary node palpated. No nipple retraction, no nipple discharge.  ABDOMEN: Soft and depressible, nontender with no palpable mass, no hepatomegaly.   EXTREMITIES: Well-developed well-nourished symmetrical with no dependent edema.  NEUROLOGICAL: Awake alert oriented, facial expression symmetrical, moving all extremities.      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  - neutropenia  - Last Mammogram done 4 months ago. Patient needs Diagnostic mammogram and ultrasound at the completion of the therapy and since she is almost a year from the screening mammogram, will perform bilateral mammogram. Patient very anxious about her left breast.   - Patient was oriented about surgical alternatives. Regarding the local breast mass patient wants breast conservative therapy. Regarding the axillary management patient already clinically positive with a biopsy proven metastatic breast cancer to node. I oriented the patient that the only way to avoid axillary node dissection is that with 2 tracers, I can identify 3 sentinel lymph nodes (plus the marked lymph node if it is not one of the sentinel lymph node) and that if they are negative. If one of the lymph node is positive, discussion will be done between me and the Oncologist about completion of axillary node dissection vs more chemotherapy or both. She refers and understood.   - Patient also with a medial right breast mass increasing in size that may be removed on the same surgery. The mass is in the same place as a previous cyst removal. Most likely recurrent benign cyst.  - In summary patient agreed to have right breast needle guided partial mastectomy with sentinel lymph node biopsy vs axillary node dissection and removal of right medial breast soft tissue mass.            PLAN:  1. Right breast needle guided partial mastectomy with sentinel lymph node biopsy vs axillary node dissection (19301 vs 19302, 38900, 38525, 11402) 2. CBC, CMP 3. Internal Medicine clearance (Dr. Jonhston) 4. Avoid aspirin 5 days before surgery  Patient and her husband verbalized understanding, all questions were answered, and were agreeable with the plan outlined above.   This was a 45 minute encounter, more than 50% in counseling and coordinating plan of care.   Stephanie Fullenwider Cintron-Diaz, MD  Electronically signed by Starlena Beil  Cintron-Diaz, MD  

## 2018-01-20 ENCOUNTER — Other Ambulatory Visit: Payer: Self-pay | Admitting: General Surgery

## 2018-01-20 DIAGNOSIS — C50411 Malignant neoplasm of upper-outer quadrant of right female breast: Secondary | ICD-10-CM

## 2018-01-20 DIAGNOSIS — C773 Secondary and unspecified malignant neoplasm of axilla and upper limb lymph nodes: Principal | ICD-10-CM

## 2018-01-20 DIAGNOSIS — C50911 Malignant neoplasm of unspecified site of right female breast: Secondary | ICD-10-CM

## 2018-01-20 DIAGNOSIS — Z171 Estrogen receptor negative status [ER-]: Secondary | ICD-10-CM

## 2018-01-25 ENCOUNTER — Ambulatory Visit
Admission: RE | Admit: 2018-01-25 | Discharge: 2018-01-25 | Disposition: A | Discharge status: Home / Self Care | Payer: Medicare Other | Source: Ambulatory Visit | Attending: General Surgery | Admitting: General Surgery

## 2018-01-25 DIAGNOSIS — C50911 Malignant neoplasm of unspecified site of right female breast: Secondary | ICD-10-CM | POA: Diagnosis present

## 2018-01-25 DIAGNOSIS — C773 Secondary and unspecified malignant neoplasm of axilla and upper limb lymph nodes: Secondary | ICD-10-CM | POA: Insufficient documentation

## 2018-01-26 ENCOUNTER — Other Ambulatory Visit: Payer: Self-pay

## 2018-01-26 ENCOUNTER — Other Ambulatory Visit: Payer: Self-pay | Admitting: General Surgery

## 2018-01-26 ENCOUNTER — Encounter
Admission: RE | Admit: 2018-01-26 | Discharge: 2018-01-26 | Disposition: A | Discharge status: Home / Self Care | Payer: Medicare Other | Source: Ambulatory Visit | Attending: General Surgery | Admitting: General Surgery

## 2018-01-26 DIAGNOSIS — Z01810 Encounter for preprocedural cardiovascular examination: Secondary | ICD-10-CM | POA: Insufficient documentation

## 2018-01-26 DIAGNOSIS — R928 Other abnormal and inconclusive findings on diagnostic imaging of breast: Secondary | ICD-10-CM

## 2018-01-26 DIAGNOSIS — I4891 Unspecified atrial fibrillation: Secondary | ICD-10-CM

## 2018-01-26 DIAGNOSIS — N6320 Unspecified lump in the left breast, unspecified quadrant: Secondary | ICD-10-CM | POA: Diagnosis present

## 2018-01-26 HISTORY — DX: Pneumonia, unspecified organism: J18.9

## 2018-01-26 NOTE — Patient Instructions (Signed)
Your procedure is scheduled on:  Monday, February 01, 2018 Report to the [facility] in the [facility] at the designated time given.   REMEMBER: Instructions that are not followed completely may result in serious medical risk, up to and including death; or upon the discretion of your surgeon and anesthesiologist your surgery may need to be rescheduled.  Do not eat food after midnight the night before surgery.  No gum chewing, lozengers or hard candies.  You may however, drink water up to 2 hours before you are scheduled to arrive for your surgery. Do not drink anything within 2 hours of the start of your surgery.  No Alcohol for 24 hours before or after surgery.  No Smoking including e-cigarettes for 24 hours prior to surgery.  No chewable tobacco products for at least 6 hours prior to surgery.  No nicotine patches on the day of surgery.  On the morning of surgery brush your teeth with toothpaste and water, you may rinse your mouth with mouthwash if you wish. Do not swallow any toothpaste or mouthwash.  Notify your doctor if there is any change in your medical condition (cold, fever, infection).  Do not wear jewelry, make-up, hairpins, clips or nail polish.  Do not wear lotions, powders, or perfumes.   Do not shave 48 hours prior to surgery.   Contacts and dentures may not be worn into surgery.  Do not bring valuables to the hospital, including drivers license, insurance or credit cards.  [facility] is not responsible for any belongings or valuables.   TAKE THESE MEDICATIONS THE MORNING OF SURGERY:  1.  LEVOTHYROXINE  Use CHG Soap as directed on instruction sheet.  Stop Metformin 2 days prior to surgery.  LAST DAY TO TAKE IS Friday, October 11....RESUME AFTER SURGERY.  NOW!  Stop Anti-inflammatories (NSAIDS) such as Advil, Aleve, Ibuprofen, Motrin, Naproxen, Naprosyn and Aspirin based products such as Excedrin, Goodys Powder, BC Powder. (May take Tylenol or  Acetaminophen if needed.)  NOW!  Stop ANY OVER THE COUNTER supplements until after surgery. (May continue Vitamin D, Vitamin B, and multivitamin.)  Wear comfortable clothing (specific to your surgery type) to the hospital.  Plan for stool softeners for home use.  If you are being discharged the day of surgery, you will not be allowed to drive home. You will need a responsible adult to drive you home and stay with you that night.   If you are taking public transportation, you will need to have a responsible adult with you. Please confirm with your physician that it is acceptable to use public transportation.   Please call [phone] if you have any questions about these instructions.

## 2018-01-26 NOTE — Pre-Procedure Instructions (Signed)
EKG'S REVIEWED BY DR Rosey Bath AND OK

## 2018-01-27 ENCOUNTER — Ambulatory Visit
Admission: RE | Admit: 2018-01-27 | Discharge: 2018-01-27 | Disposition: A | Discharge status: Home / Self Care | Payer: Medicare Other | Source: Ambulatory Visit | Attending: General Surgery | Admitting: General Surgery

## 2018-01-27 DIAGNOSIS — R928 Other abnormal and inconclusive findings on diagnostic imaging of breast: Secondary | ICD-10-CM | POA: Insufficient documentation

## 2018-01-27 DIAGNOSIS — N6320 Unspecified lump in the left breast, unspecified quadrant: Secondary | ICD-10-CM

## 2018-01-28 LAB — SURGICAL PATHOLOGY

## 2018-01-31 MED ORDER — CEFAZOLIN SODIUM-DEXTROSE 2-4 GM/100ML-% IV SOLN
2.0000 g | INTRAVENOUS | Status: AC
  Administered 2018-02-01: 2 g via INTRAVENOUS

## 2018-02-01 ENCOUNTER — Encounter: Payer: Self-pay | Admitting: *Deleted

## 2018-02-01 ENCOUNTER — Encounter
Admission: RE | Admit: 2018-02-01 | Discharge: 2018-02-01 | Disposition: A | Discharge status: Home / Self Care | Payer: Medicare Other | Source: Ambulatory Visit | Attending: General Surgery | Admitting: General Surgery

## 2018-02-01 ENCOUNTER — Other Ambulatory Visit: Payer: Self-pay

## 2018-02-01 ENCOUNTER — Ambulatory Visit: Payer: Medicare Other | Admitting: Certified Registered"

## 2018-02-01 ENCOUNTER — Ambulatory Visit
Admission: RE | Admit: 2018-02-01 | Discharge: 2018-02-01 | Disposition: A | Discharge status: Home / Self Care | Payer: Medicare Other | Source: Ambulatory Visit | Attending: General Surgery | Admitting: General Surgery

## 2018-02-01 ENCOUNTER — Encounter
Admission: RE | Disposition: A | Discharge status: Home / Self Care | Payer: Self-pay | Source: Ambulatory Visit | Attending: General Surgery

## 2018-02-01 DIAGNOSIS — T451X5A Adverse effect of antineoplastic and immunosuppressive drugs, initial encounter: Secondary | ICD-10-CM | POA: Insufficient documentation

## 2018-02-01 DIAGNOSIS — C50411 Malignant neoplasm of upper-outer quadrant of right female breast: Secondary | ICD-10-CM

## 2018-02-01 DIAGNOSIS — G620 Drug-induced polyneuropathy: Secondary | ICD-10-CM | POA: Diagnosis not present

## 2018-02-01 DIAGNOSIS — C50911 Malignant neoplasm of unspecified site of right female breast: Secondary | ICD-10-CM

## 2018-02-01 DIAGNOSIS — Z171 Estrogen receptor negative status [ER-]: Secondary | ICD-10-CM

## 2018-02-01 DIAGNOSIS — Z9221 Personal history of antineoplastic chemotherapy: Secondary | ICD-10-CM | POA: Insufficient documentation

## 2018-02-01 DIAGNOSIS — Z882 Allergy status to sulfonamides status: Secondary | ICD-10-CM | POA: Insufficient documentation

## 2018-02-01 DIAGNOSIS — Z87891 Personal history of nicotine dependence: Secondary | ICD-10-CM | POA: Insufficient documentation

## 2018-02-01 DIAGNOSIS — R011 Cardiac murmur, unspecified: Secondary | ICD-10-CM | POA: Insufficient documentation

## 2018-02-01 DIAGNOSIS — E1121 Type 2 diabetes mellitus with diabetic nephropathy: Secondary | ICD-10-CM | POA: Insufficient documentation

## 2018-02-01 DIAGNOSIS — Z79899 Other long term (current) drug therapy: Secondary | ICD-10-CM | POA: Diagnosis not present

## 2018-02-01 DIAGNOSIS — Z8249 Family history of ischemic heart disease and other diseases of the circulatory system: Secondary | ICD-10-CM | POA: Insufficient documentation

## 2018-02-01 DIAGNOSIS — C773 Secondary and unspecified malignant neoplasm of axilla and upper limb lymph nodes: Principal | ICD-10-CM

## 2018-02-01 DIAGNOSIS — I081 Rheumatic disorders of both mitral and tricuspid valves: Secondary | ICD-10-CM | POA: Insufficient documentation

## 2018-02-01 DIAGNOSIS — E559 Vitamin D deficiency, unspecified: Secondary | ICD-10-CM | POA: Diagnosis not present

## 2018-02-01 DIAGNOSIS — E039 Hypothyroidism, unspecified: Secondary | ICD-10-CM | POA: Insufficient documentation

## 2018-02-01 DIAGNOSIS — Z7984 Long term (current) use of oral hypoglycemic drugs: Secondary | ICD-10-CM | POA: Insufficient documentation

## 2018-02-01 DIAGNOSIS — I10 Essential (primary) hypertension: Secondary | ICD-10-CM | POA: Insufficient documentation

## 2018-02-01 DIAGNOSIS — Z888 Allergy status to other drugs, medicaments and biological substances status: Secondary | ICD-10-CM | POA: Diagnosis not present

## 2018-02-01 HISTORY — PX: SENTINEL NODE BIOPSY: SHX6608

## 2018-02-01 HISTORY — PX: PARTIAL MASTECTOMY WITH NEEDLE LOCALIZATION: SHX6008

## 2018-02-01 HISTORY — PX: BREAST LUMPECTOMY: SHX2

## 2018-02-01 HISTORY — PX: AXILLARY LYMPH NODE DISSECTION: SHX5229

## 2018-02-01 LAB — GLUCOSE, CAPILLARY
GLUCOSE-CAPILLARY: 119 mg/dL — AB (ref 70–99)
GLUCOSE-CAPILLARY: 128 mg/dL — AB (ref 70–99)

## 2018-02-01 SURGERY — PARTIAL MASTECTOMY WITH NEEDLE LOCALIZATION
Anesthesia: General | Laterality: Right

## 2018-02-01 MED ORDER — HYDROCODONE-ACETAMINOPHEN 5-325 MG PO TABS: 1.0000 | ORAL_TABLET | ORAL | 0 refills | Status: AC | PRN

## 2018-02-01 MED ORDER — FAMOTIDINE 20 MG PO TABS: 20.0000 mg | ORAL_TABLET | Freq: Once | ORAL | Status: DC

## 2018-02-01 MED ORDER — FENTANYL CITRATE (PF) 100 MCG/2ML IJ SOLN
INTRAMUSCULAR | Status: AC
  Filled 2018-02-01: qty 2

## 2018-02-01 MED ORDER — METHYLENE BLUE 0.5 % INJ SOLN
INTRAVENOUS | Status: DC | PRN
  Administered 2018-02-01: 5 mL via SUBMUCOSAL

## 2018-02-01 MED ORDER — CEFAZOLIN SODIUM-DEXTROSE 2-4 GM/100ML-% IV SOLN
INTRAVENOUS | Status: AC
  Filled 2018-02-01: qty 100

## 2018-02-01 MED ORDER — TECHNETIUM TC 99M SULFUR COLLOID FILTERED
1.0000 | Freq: Once | INTRAVENOUS | Status: AC | PRN
  Administered 2018-02-01: 1.06 via INTRADERMAL

## 2018-02-01 MED ORDER — FENTANYL CITRATE (PF) 100 MCG/2ML IJ SOLN
INTRAMUSCULAR | Status: AC
  Administered 2018-02-01: 25 ug via INTRAVENOUS
  Filled 2018-02-01: qty 2

## 2018-02-01 MED ORDER — ACETAMINOPHEN 10 MG/ML IV SOLN
INTRAVENOUS | Status: DC | PRN
  Administered 2018-02-01: 1000 mg via INTRAVENOUS

## 2018-02-01 MED ORDER — FENTANYL CITRATE (PF) 100 MCG/2ML IJ SOLN
25.0000 ug | INTRAMUSCULAR | Status: AC | PRN
  Administered 2018-02-01 (×6): 25 ug via INTRAVENOUS

## 2018-02-01 MED ORDER — LIDOCAINE HCL (PF) 2 % IJ SOLN
INTRAMUSCULAR | Status: AC
  Filled 2018-02-01: qty 10

## 2018-02-01 MED ORDER — HYDROCODONE-ACETAMINOPHEN 5-325 MG PO TABS
ORAL_TABLET | ORAL | Status: AC
  Filled 2018-02-01: qty 1

## 2018-02-01 MED ORDER — FENTANYL CITRATE (PF) 100 MCG/2ML IJ SOLN
INTRAMUSCULAR | Status: DC | PRN
  Administered 2018-02-01 (×4): 50 ug via INTRAVENOUS

## 2018-02-01 MED ORDER — DEXAMETHASONE SODIUM PHOSPHATE 10 MG/ML IJ SOLN
INTRAMUSCULAR | Status: AC
  Filled 2018-02-01: qty 1

## 2018-02-01 MED ORDER — ONDANSETRON HCL 4 MG/2ML IJ SOLN
INTRAMUSCULAR | Status: AC
  Filled 2018-02-01: qty 2

## 2018-02-01 MED ORDER — ONDANSETRON HCL 4 MG/2ML IJ SOLN: 4.0000 mg | Freq: Once | INTRAMUSCULAR | Status: DC | PRN

## 2018-02-01 MED ORDER — PROPOFOL 10 MG/ML IV BOLUS
INTRAVENOUS | Status: AC
  Filled 2018-02-01: qty 20

## 2018-02-01 MED ORDER — HYDROCODONE-ACETAMINOPHEN 5-325 MG PO TABS
1.0000 | ORAL_TABLET | ORAL | Status: DC | PRN
  Administered 2018-02-01: 1 via ORAL

## 2018-02-01 MED ORDER — ACETAMINOPHEN 10 MG/ML IV SOLN
INTRAVENOUS | Status: AC
  Filled 2018-02-01: qty 100

## 2018-02-01 MED ORDER — FENTANYL CITRATE (PF) 100 MCG/2ML IJ SOLN
25.0000 ug | INTRAMUSCULAR | Status: DC | PRN
  Administered 2018-02-01: 25 ug via INTRAVENOUS

## 2018-02-01 MED ORDER — FAMOTIDINE 20 MG PO TABS
ORAL_TABLET | ORAL | Status: AC
  Administered 2018-02-01: 20 mg
  Filled 2018-02-01: qty 1

## 2018-02-01 MED ORDER — SODIUM CHLORIDE 0.9 % IV SOLN
INTRAVENOUS | Status: DC
  Administered 2018-02-01: 11:00:00 via INTRAVENOUS

## 2018-02-01 MED ORDER — ONDANSETRON HCL 4 MG/2ML IJ SOLN
INTRAMUSCULAR | Status: DC | PRN
  Administered 2018-02-01: 4 mg via INTRAVENOUS

## 2018-02-01 MED ORDER — LIDOCAINE HCL (CARDIAC) PF 100 MG/5ML IV SOSY
PREFILLED_SYRINGE | INTRAVENOUS | Status: DC | PRN
  Administered 2018-02-01: 80 mg via INTRAVENOUS

## 2018-02-01 MED ORDER — METHYLENE BLUE 0.5 % INJ SOLN
INTRAVENOUS | Status: AC
  Filled 2018-02-01: qty 10

## 2018-02-01 MED ORDER — DEXAMETHASONE SODIUM PHOSPHATE 10 MG/ML IJ SOLN
INTRAMUSCULAR | Status: DC | PRN
  Administered 2018-02-01: 4 mg via INTRAVENOUS

## 2018-02-01 MED ORDER — PROPOFOL 10 MG/ML IV BOLUS
INTRAVENOUS | Status: DC | PRN
  Administered 2018-02-01: 120 mg via INTRAVENOUS

## 2018-02-01 SURGICAL SUPPLY — 56 items
BLADE SURG 15 STRL LF DISP TIS (BLADE) ×2 IMPLANT
BLADE SURG 15 STRL SS (BLADE) ×2
BULB RESERV EVAC DRAIN JP 100C (MISCELLANEOUS) IMPLANT
CANISTER SUCT 1200ML W/VALVE (MISCELLANEOUS) ×2 IMPLANT
CHLORAPREP W/TINT 26ML (MISCELLANEOUS) ×2 IMPLANT
CNTNR SPEC 2.5X3XGRAD LEK (MISCELLANEOUS) ×3
CONT SPEC 4OZ STER OR WHT (MISCELLANEOUS) ×3
CONTAINER SPEC 2.5X3XGRAD LEK (MISCELLANEOUS) ×2 IMPLANT
COVER WAND RF STERILE (DRAPES) ×2 IMPLANT
DERMABOND ADVANCED (GAUZE/BANDAGES/DRESSINGS) ×1
DERMABOND ADVANCED .7 DNX12 (GAUZE/BANDAGES/DRESSINGS) ×1 IMPLANT
DEVICE DUBIN SPECIMEN MAMMOGRA (MISCELLANEOUS) ×2 IMPLANT
DRAIN CHANNEL JP 15F RND 16 (MISCELLANEOUS) IMPLANT
DRAPE LAPAROTOMY 100X77 ABD (DRAPES) ×1 IMPLANT
DRAPE LAPAROTOMY 77X122 PED (DRAPES) ×2 IMPLANT
DRAPE LAPAROTOMY TRNSV 106X77 (MISCELLANEOUS) ×2 IMPLANT
ELECT CAUTERY BLADE TIP 2.5 (TIP) ×2
ELECT REM PT RETURN 9FT ADLT (ELECTROSURGICAL) ×2
ELECTRODE CAUTERY BLDE TIP 2.5 (TIP) IMPLANT
ELECTRODE REM PT RTRN 9FT ADLT (ELECTROSURGICAL) ×1 IMPLANT
GAUZE SPONGE 4X4 12PLY STRL (GAUZE/BANDAGES/DRESSINGS) ×2 IMPLANT
GLOVE BIO SURGEON STRL SZ 6.5 (GLOVE) ×5 IMPLANT
GLOVE BIO SURGEON STRL SZ7.5 (GLOVE) ×2 IMPLANT
GLOVE BIOGEL M 6.5 STRL (GLOVE) ×1 IMPLANT
GOWN STRL REUS W/ TWL LRG LVL3 (GOWN DISPOSABLE) ×2 IMPLANT
GOWN STRL REUS W/TWL LRG LVL3 (GOWN DISPOSABLE) ×3
KIT TURNOVER KIT A (KITS) ×2 IMPLANT
LABEL OR SOLS (LABEL) ×1 IMPLANT
MARGIN MAP 10MM (MISCELLANEOUS) ×2 IMPLANT
MARKER SKIN DUAL TIP RULER LAB (MISCELLANEOUS) ×1 IMPLANT
NDL HYPO 25X1 1.5 SAFETY (NEEDLE) ×1 IMPLANT
NDL SAFETY ECLIPSE 18X1.5 (NEEDLE) ×1 IMPLANT
NEEDLE HYPO 18GX1.5 SHARP (NEEDLE) ×1
NEEDLE HYPO 22GX1.5 SAFETY (NEEDLE) ×2 IMPLANT
NEEDLE HYPO 25X1 1.5 SAFETY (NEEDLE) ×2 IMPLANT
PACK BASIN MINOR ARMC (MISCELLANEOUS) ×2 IMPLANT
RETRACTOR WOUND ALXS 18CM SML (MISCELLANEOUS) ×1 IMPLANT
RTRCTR WOUND ALEXIS O 18CM SML (MISCELLANEOUS)
SHEARS HARMONIC 9CM CVD (BLADE) IMPLANT
SLEVE PROBE SENORX GAMMA FIND (MISCELLANEOUS) ×2 IMPLANT
SPONGE LAP 18X18 RF (DISPOSABLE) ×1 IMPLANT
SUT CHROMIC 4 0 RB 1X27 (SUTURE) ×2 IMPLANT
SUT ETH BLK MONO 3 0 FS 1 12/B (SUTURE) ×2 IMPLANT
SUT ETHILON 3-0 FS-10 30 BLK (SUTURE)
SUT ETHILON 4-0 (SUTURE) ×1
SUT ETHILON 4-0 FS2 18XMFL BLK (SUTURE) ×1
SUT MNCRL 4-0 (SUTURE) ×1
SUT MNCRL 4-0 27XMFL (SUTURE) ×1
SUT SILK 2 0 SH (SUTURE) ×2 IMPLANT
SUT VIC AB 3-0 SH 27 (SUTURE) ×2
SUT VIC AB 3-0 SH 27X BRD (SUTURE) ×1 IMPLANT
SUTURE EHLN 3-0 FS-10 30 BLK (SUTURE) ×1 IMPLANT
SUTURE ETHLN 4-0 FS2 18XMF BLK (SUTURE) ×1 IMPLANT
SUTURE MNCRL 4-0 27XMF (SUTURE) ×1 IMPLANT
SYR 10ML LL (SYRINGE) ×3 IMPLANT
WATER STERILE IRR 1000ML POUR (IV SOLUTION) ×2 IMPLANT

## 2018-02-01 NOTE — Anesthesia Preprocedure Evaluation (Signed)
Anesthesia Evaluation  Patient identified by MRN, date of birth, ID band Patient awake    Reviewed: Allergy & Precautions, NPO status , Patient's Chart, lab work & pertinent test results  History of Anesthesia Complications Negative for: history of anesthetic complications  Airway Mallampati: II       Dental   Pulmonary neg sleep apnea, neg COPD, former smoker,           Cardiovascular hypertension, Pt. on medications (-) Past MI and (-) CHF (-) dysrhythmias (-) Valvular Problems/Murmurs     Neuro/Psych neg Seizures  Neuromuscular disease (chemo induced neuropathy)    GI/Hepatic Neg liver ROS, neg GERD  ,  Endo/Other  diabetes, Type 2, Oral Hypoglycemic AgentsHypothyroidism   Renal/GU Renal InsufficiencyRenal disease     Musculoskeletal   Abdominal   Peds  Hematology   Anesthesia Other Findings   Reproductive/Obstetrics                             Anesthesia Physical Anesthesia Plan  ASA: II  Anesthesia Plan: General   Post-op Pain Management:    Induction: Intravenous  PONV Risk Score and Plan: 3 and Midazolam, Dexamethasone and Ondansetron  Airway Management Planned: LMA  Additional Equipment:   Intra-op Plan:   Post-operative Plan:   Informed Consent: I have reviewed the patients History and Physical, chart, labs and discussed the procedure including the risks, benefits and alternatives for the proposed anesthesia with the patient or authorized representative who has indicated his/her understanding and acceptance.     Plan Discussed with:   Anesthesia Plan Comments:         Anesthesia Quick Evaluation

## 2018-02-01 NOTE — Anesthesia Postprocedure Evaluation (Signed)
Anesthesia Post Note  Patient: Stephanie Sanders  Procedure(s) Performed: PARTIAL MASTECTOMY WITH NEEDLE LOCALIZATION (Right ) SENTINEL NODE BIOPSY (Right ) AXILLARY LYMPH NODE DISSECTION (Right )  Patient location during evaluation: PACU Anesthesia Type: General Level of consciousness: awake and alert Pain management: pain level controlled Vital Signs Assessment: post-procedure vital signs reviewed and stable Respiratory status: spontaneous breathing, nonlabored ventilation, respiratory function stable and patient connected to nasal cannula oxygen Cardiovascular status: blood pressure returned to baseline and stable Postop Assessment: no apparent nausea or vomiting Anesthetic complications: no     Last Vitals:  Vitals:   02/01/18 1706 02/01/18 1725  BP: 139/71 (!) 146/63  Pulse: 79 99  Resp: 12 14  Temp: 37.6 C 36.6 C  SpO2: 98% 100%    Last Pain:  Vitals:   02/01/18 1725  TempSrc: Temporal  PainSc: [place]

## 2018-02-01 NOTE — Interval H&P Note (Signed)
History and Physical Interval Note:  02/01/2018 12:54 PM  Stephanie Sanders  has presented today for surgery, with the diagnosis of breast cancer metastasized to axillary lymph node  The various methods of treatment have been discussed with the patient and family. After consideration of risks, benefits and other options for treatment, the patient has consented to  Procedure(s): PARTIAL MASTECTOMY WITH NEEDLE LOCALIZATION (Right) SENTINEL NODE BIOPSY (Right) AXILLARY LYMPH NODE DISSECTION (Right) as a surgical intervention .  The patient's history has been reviewed, patient examined, no change in status, stable for surgery.  I have reviewed the patient's chart and labs. Right breast marked in the pre procedure room. Questions were answered to the patient's satisfaction.     Herbert Pun

## 2018-02-01 NOTE — Discharge Instructions (Signed)
AMBULATORY SURGERY  DISCHARGE INSTRUCTIONS   1) The drugs that you were given will stay in your system until tomorrow so for the next 24 hours you should not:  A) Drive an automobile B) Make any legal decisions C) Drink any alcoholic beverage   2) You may resume regular meals tomorrow.  Today it is better to start with liquids and gradually work up to solid foods.  You may eat anything you prefer, but it is better to start with liquids, then soup and crackers, and gradually work up to solid foods.   3) Please notify your doctor immediately if you have any unusual bleeding, trouble breathing, redness and pain at the surgery site, drainage, fever, or pain not relieved by medication. 4)   5) Your post-operative visit with Dr.                                     is: Date:                        Time:    Please call to schedule your post-operative visit.  6) Additional Instructions:       Diet: Resume home heart healthy regular diet.   Activity: No heavy lifting >20 pounds (children, pets, laundry, garbage) or strenuous activity until follow-up, but light activity and walking are encouraged. Do not drive or drink alcohol if taking narcotic pain medications.  Wound care: May shower with soapy water and pat dry (do not rub incisions), but no baths or submerging incision underwater until follow-up. (no swimming). May use a support/sport bra.   Medications: Resume all home medications. For mild to moderate pain: acetaminophen (Tylenol) or ibuprofen (if no kidney disease). Combining Tylenol with alcohol can substantially increase your risk of causing liver disease. Narcotic pain medications, if prescribed, can be used for severe pain, though may cause nausea, constipation, and drowsiness. Do not combine Tylenol and Norco within a 6 hour period as Norco contains Tylenol. If you do not need the narcotic pain medication, you do not need to fill the prescription.  Call office [phone])  at any time if any questions, worsening pain, fevers/chills, bleeding, drainage from incision site, or other concerns.

## 2018-02-01 NOTE — Transfer of Care (Signed)
Immediate Anesthesia Transfer of Care Note  Patient: Stephanie Sanders  Procedure(s) Performed: PARTIAL MASTECTOMY WITH NEEDLE LOCALIZATION (Right ) SENTINEL NODE BIOPSY (Right ) AXILLARY LYMPH NODE DISSECTION (Right )  Patient Location: PACU  Anesthesia Type:General  Level of Consciousness: sedated  Airway & Oxygen Therapy: Patient Spontanous Breathing and Patient connected to face mask oxygen  Post-op Assessment: Report given to RN and Post -op Vital signs reviewed and stable  Post vital signs: Reviewed and stable  Last Vitals:  Vitals Value Taken Time  BP 118/60 02/01/2018  3:55 PM  Temp    Pulse 79 02/01/2018  3:55 PM  Resp 14 02/01/2018  3:55 PM  SpO2 100 % 02/01/2018  3:55 PM    Last Pain:  Vitals:   02/01/18 1030  TempSrc: Oral  PainSc: 0-No pain         Complications: No apparent anesthesia complications

## 2018-02-01 NOTE — Op Note (Signed)
Preoperative diagnosis: Right breast carcinoma.  Postoperative diagnosis: Right breast carcinoma..   Procedure: Right needle-localized partial mastectomy                    Intra operative mapping of sentinel lymph nodes with Methylene blue                    Right Axillary Sentinel Lymph node biopsy     Anesthesia: GETA  Surgeon: Dr. Windell Moment  Wound Classification: Clean  Indications: Patient is a 72 y.o. female with a nonpalpable right breast mass noted on mammography with core biopsy demonstrating mammary carcinoma and a positive lymph node on biopsy. The patient received neoadjuvant therapy with partial response. Patient elected to have needle guided partial mastectomy. She wanted to avoid complete axillary node excision. Double tracer sentinel lymph node biopsy including the marked lymph node was elected. Patient oriented about the possibility of needing complete axillary node dissection.  Findings: 1. Specimen mammography shows marker and wire on specimen 2. Pathology call refers gross examination of margins was grossly negative and that there is no macro-mestastasis on 4 sentinel lymph nodes.  3. No other palpable mass or lymph node identified.   Description of procedure: Preoperative needle localization was performed by radiology. In the nuclear medicine suite, the subareolar region was injected with Tc-99 sulfur colloid. Localization studies were reviewed. The patient was taken to the operating room and placed supine on the operating table, and after general anesthesia the right chest and axilla were prepped and draped in the usual sterile fashion. A time-out was completed verifying correct patient, procedure, site, positioning, and implant(s) and/or special equipment prior to beginning this procedure. Five milliliters of Methylene blue die was infiltrated subcutaneously on the periphery of the mass.  By comparing the localization studies with the direction and skin entry site of  the needle, the probable trajectory and location of the mass was visualized. A circumareolar skin incision was planned in such a way as to minimize the amount of dissection to reach the mass.  The skin incision was made. Flaps were raised and the location of the wire confirmed. The wire was delivered into the wound. A 2-0 silk figure-of-eight stay suture was placed around the wire and used for retraction. Dissection was then taken down circumferentially, taking care to include the entire localizing needle and a wide margin of grossly normal tissue. The specimen and entire localizing wire were removed. The specimen was oriented and sent to radiology with the localization studies. Confirmation was received that the entire target lesion had been resected but very close the the anterior superior margin. A re excision of the anterior superior margin was done and sent to pathology. The wound was irrigated. Hemostasis was checked. The wound was closed with interrupted sutures of 3-0 Vicryl and a subcuticular suture of Monocryl 3-0. No attempt was made to close the dead space. A dressing was applied.   A hand-held gamma probe was used to identify the location of the hottest spot in the axilla.  An incision was made around the caudal axillary hairline. Dissection was carried down until subdermal facias was advanced. The probe was placed and again, the point of maximal count was found. Dissection continue until nodule was identified. The node was excised in its entirety. Ex vivo, the node measured 635 counts when placed on the probe. This also correlates with the area that captured the blue dye and the localization wired. An additional hot spot was  detected and the node was excised in similar fashion. Three other sentinel lymph nodes were identified with the gamma probe but did not captured the blue dye. No clinically abnormal nodes were palpated. The procedure was terminated. Hemostasis was achieved and the wound closed  in layers with deep interrupted 3-0 Vicryl and skin was closed with subcuticular suture of Monocryl 3-0.  The patient tolerated the procedure well and was taken to the postanesthesia care unit in stable condition.   Specimen: Right Breast mass (Orientation markers used: Cranial, Medial,Skin present)               Sentinel Lymph nodes #1 (highest count, stained blue, localization wire               Sentinel Lymph nodes #2, #3, #4.  Complications: None  Estimated Blood Loss: 25 mL

## 2018-02-01 NOTE — Anesthesia Procedure Notes (Signed)
Procedure Name: LMA Insertion Performed by: Lance Muss, CRNA Pre-anesthesia Checklist: Patient identified, Patient being monitored, Timeout performed, Emergency Drugs available and Suction available Patient Re-evaluated:Patient Re-evaluated prior to induction Oxygen Delivery Method: Circle system utilized Preoxygenation: Pre-oxygenation with 100% oxygen Induction Type: IV induction Ventilation: Mask ventilation without difficulty LMA: LMA inserted LMA Size: 3.0 Tube type: Oral Number of attempts: 2 Placement Confirmation: positive ETCO2 and breath sounds checked- equal and bilateral Tube secured with: Tape Dental Injury: Teeth and Oropharynx as per pre-operative assessment  Comments: Unable to place 3.5 LMA, attempted with 3.0 LMA next. Able to seat second LMA, +ETCO2, +BBS.

## 2018-02-01 NOTE — Anesthesia Post-op Follow-up Note (Signed)
Anesthesia QCDR form completed.        

## 2018-02-02 ENCOUNTER — Encounter: Payer: Self-pay | Admitting: General Surgery

## 2018-02-04 ENCOUNTER — Inpatient Hospital Stay: Payer: Medicare Other | Attending: Hematology and Oncology

## 2018-02-04 DIAGNOSIS — C50411 Malignant neoplasm of upper-outer quadrant of right female breast: Secondary | ICD-10-CM | POA: Insufficient documentation

## 2018-02-04 DIAGNOSIS — C801 Malignant (primary) neoplasm, unspecified: Secondary | ICD-10-CM

## 2018-02-04 LAB — SURGICAL PATHOLOGY

## 2018-02-04 MED ORDER — HEPARIN SOD (PORK) LOCK FLUSH 100 UNIT/ML IV SOLN
500.0000 [IU] | Freq: Once | INTRAVENOUS | Status: AC
  Administered 2018-02-04: 500 [IU] via INTRAVENOUS
  Filled 2018-02-04: qty 5

## 2018-02-04 MED ORDER — SODIUM CHLORIDE 0.9% FLUSH
10.0000 mL | INTRAVENOUS | Status: DC | PRN
  Administered 2018-02-04: 10 mL via INTRAVENOUS
  Filled 2018-02-04: qty 10

## 2018-02-19 ENCOUNTER — Encounter: Payer: Self-pay | Admitting: Hematology and Oncology

## 2018-02-19 ENCOUNTER — Ambulatory Visit: Payer: Medicare Other

## 2018-02-19 ENCOUNTER — Inpatient Hospital Stay: Payer: Medicare Other | Attending: Hematology and Oncology | Admitting: Hematology and Oncology

## 2018-02-19 VITALS — BP 138/83 | HR 76 | Temp 98.2°F | Resp 18 | Wt 140.6 lb

## 2018-02-19 DIAGNOSIS — Z23 Encounter for immunization: Secondary | ICD-10-CM

## 2018-02-19 DIAGNOSIS — Z171 Estrogen receptor negative status [ER-]: Secondary | ICD-10-CM

## 2018-02-19 DIAGNOSIS — T451X5A Adverse effect of antineoplastic and immunosuppressive drugs, initial encounter: Secondary | ICD-10-CM

## 2018-02-19 DIAGNOSIS — G620 Drug-induced polyneuropathy: Secondary | ICD-10-CM | POA: Diagnosis not present

## 2018-02-19 DIAGNOSIS — C50911 Malignant neoplasm of unspecified site of right female breast: Secondary | ICD-10-CM | POA: Diagnosis not present

## 2018-02-19 DIAGNOSIS — Z7189 Other specified counseling: Secondary | ICD-10-CM

## 2018-02-19 MED ORDER — INFLUENZA VAC SPLIT HIGH-DOSE 0.5 ML IM SUSY
0.5000 mL | PREFILLED_SYRINGE | Freq: Once | INTRAMUSCULAR | Status: AC
  Administered 2018-02-19: 0.5 mL via INTRAMUSCULAR
  Filled 2018-02-19: qty 0.5

## 2018-02-19 NOTE — Progress Notes (Signed)
Patient had lumpectomy on 02-01-18.  She is doing well.  Offers no complaints today.

## 2018-02-19 NOTE — Progress Notes (Signed)
[facility] day:  02/19/2018   Chief Complaint: Stephanie Sanders is a 72 y.o. female with multi-focal right breast cancer who is seen for assessment following interval right partial mastectomy and sentinel lymph node biopsy.  HPI:  The patient was last seen in the medical oncology clinic on 01/05/2018.  At that time, she felt "pretty good".  She noted fatigue following her last treatment, but attributed this to her "over doing it". Bowels were unchanged. She continued to have chronically loose stools.  Plans were to restart Welchol regimen when chemotherapy completed. She denied any B symptoms. She denied any interval infections. Exam was stable. WBC was 3200 (Kirvin 2200).   She received week #12 Taxol.  She underwent right partial mastectomy and sentinel lymph node biopsy on 02/01/2018 by Dr. Windell Moment.  Pathology revealed 10 mm grade II invasive carcinoma of no special type.  The tumor bed encompassed the biopsy sites, with foci of residual  carcinoma at both biopsy sites and also involving the tissue between the  biopsy sites. Foci of residual carcinoma range from 1 to 10 mm.There was no DCIS.  Distance from closest margin was 1 mm.  Four of 4 lymph nodes were negative.  Pathologic stage was ypT1b (m) ypN0 (sn).  Symptomatically, patient is doing well overall. She is recovering well from her partial mastectomy. She notes an uncomplicated surgical course. Diarrhea has resolved. She is now constipated, and is having to use Dulcolax every 3 days. 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 She has the sensation of "walking on a ball" under her LEFT foot". She has refused interventions in the past.   Patient denies that she has experienced any B symptoms. She denies any interval infections. Patient advises that she maintains an adequate appetite. She is eating well. Weight today is 140 lb 9 oz (63.8 kg), which compared to her last visit to the clinic,  represents a 3 pound increase.  Patient denies pain in the clinic today.   Past Medical History:  Diagnosis Date  . Cancer ([place]) 04/2017   right breast  . Diabetes mellitus without complication ([place])   . Diabetic nephropathy ([place])   . Headache    migraines  . Heart murmur    ASYMPTOMATIC  . Hypertension   . Hypothyroidism   . Personal history of chemotherapy 2019   right breast cancer  . Pneumonia   . Seborrheic keratosis   . Vitamin D deficiency     Past Surgical History:  Procedure Laterality Date  . APPENDECTOMY    . AXILLARY LYMPH NODE DISSECTION Right 02/01/2018   Procedure: AXILLARY LYMPH NODE DISSECTION;  Surgeon: Herbert Pun, MD;  Location: [facility] ORS;  Service: General;  Laterality: Right;  . BREAST BIOPSY Right 05/28/2017   right breast bx 3 areas invasive mamm ca lymph node mets  . BREAST CYST ASPIRATION Bilateral    neg  . BREAST LUMPECTOMY Right 02/01/2018  . CHOLECYSTECTOMY    . COLONOSCOPY WITH PROPOFOL N/A 01/01/2015   Procedure: COLONOSCOPY WITH PROPOFOL;  Surgeon: Josefine Class, MD;  Location: [facility] END[facility]COPY;  Service: Endoscopy;  Laterality: N/A;  . EYE SURGERY Left    eyelid  . PARTIAL MASTECTOMY WITH NEEDLE LOCALIZATION Right 02/01/2018   Procedure: PARTIAL MASTECTOMY WITH NEEDLE LOCALIZATION;  Surgeon: Herbert Pun, MD;  Location: [facility] ORS;  Service: General;  Laterality: Right;  . PORTACATH PLACEMENT Right 06/10/2017   Procedure: INSERTION PORT-A-CATH;  Surgeon: Herbert Pun, MD;  Location: [facility] ORS;  Service: General;  Laterality: Right;  . SENTINEL NODE BIOPSY Right 02/01/2018   Procedure: SENTINEL NODE BIOPSY;  Surgeon: Herbert Pun, MD;  Location: [facility] ORS;  Service: General;  Laterality: Right;  . STENT PLACEMENT ILIAC ([place] HX)     temporary placement s/p cholecystectomy  . TUBAL LIGATION      Family History  Problem Relation Age of Onset  . Parkinson's disease Mother   . Cancer Maternal Aunt   .  Cancer Father   . Lung cancer Father   . Breast cancer Neg Hx     Social History:  reports that she quit smoking about 45 years ago. Her smoking use included cigarettes. She has a 20.00 pack-year smoking history. She has never used smokeless tobacco. She reports that she does not drink alcohol or use drugs.  She has 3 children (daughters: age 58 and 20; son Jaquelyn Bitter): age 39).  She is retired.  She worked for the [employer].  Her husband has dementia.  She lives in [place].  She is alone today.  Allergies:  Allergies  Allergen Reactions  . Fiorinal [Butalbital-Aspirin-Caffeine] Nausea And Vomiting and Other (See Comments)    SEVERE HEADACHE  . Sulfa Antibiotics Other (See Comments)    Burning esophagus and stomach  . Tramadol Nausea And Vomiting  . Other Rash    STERI-STRIPS-RASH    Current Medications: Current Outpatient Medications  Medication Sig Dispense Refill  . acetaminophen (TYLENOL) 500 MG tablet Take 1,000 mg by mouth every 6 (six) hours as needed for moderate pain or headache.     Marland Kitchen atorvastatin (LIPITOR) 10 MG tablet Take 10 mg by mouth every evening.     . Cholecalciferol 2000 UNITS CAPS Take 4,000 Units by mouth daily.     . colesevelam (WELCHOL) 625 MG tablet Take 1,875 mg by mouth every evening.     Marland Kitchen levothyroxine (SYNTHROID, LEVOTHROID) 50 MCG tablet Take 50 mcg by mouth daily before breakfast.    . lidocaine-prilocaine (EMLA) cream Apply 1 application topically as needed. (Patient taking differently: Apply 1 application topically as needed (for port access). ) 30 g 6  . LORazepam (ATIVAN) 0.5 MG tablet Take 1 tablet (0.5 mg total) by mouth every 6 (six) hours as needed (Nausea or vomiting). 30 tablet 0  . MAGNESIUM GLYCINATE PLUS PO Take 1 tablet by mouth at bedtime.     . Melatonin 5 MG TABS Take 5 mg by mouth at bedtime.     . metFORMIN (GLUCOPHAGE) 500 MG tablet Take 500 mg by mouth 2 (two) times daily.     . quinapril (ACCUPRIL) 5 MG tablet Take 5 mg by mouth every  morning.     . sodium bicarbonate 650 MG tablet Take 1,300 mg by mouth at bedtime.     No current facility-administered medications for this visit.    Facility-Administered Medications Ordered in Other Visits  Medication Dose Route Frequency Provider Last Rate Last Dose  . sodium chloride flush (NS) 0.9 % injection 10 mL  10 mL Intravenous PRN Lequita Asal, MD   10 mL at 10/21/17 0841    Review of Systems  Constitutional: Negative for diaphoresis, fever, malaise/fatigue and weight loss (up 4 pounds).  HENT: Negative.        Dysgeusia continues to improve  Eyes: Negative.   Respiratory: Negative for cough, hemoptysis, sputum production and shortness of breath.   Cardiovascular: Negative for chest pain, palpitations, orthopnea, leg swelling and PND.  Gastrointestinal: Positive for  constipation and nausea (controlled). Negative for abdominal pain, blood in stool, diarrhea (resolved), melena and vomiting.  Genitourinary: Negative for dysuria, frequency, hematuria and urgency.  Musculoskeletal: Negative for back pain, falls, joint pain and myalgias.  Skin: Negative for itching and rash.       RIGHT partial mastectomy on 02/01/2018.  Neurological: Positive for sensory change (stable neuropathy in LEFT hand. Sensation of "walking on a ball" in LEFT foot). Negative for dizziness, tremors, weakness and headaches.  Endo/Heme/Allergies: Does not bruise/bleed easily.       PMH (+) for diabetes - on metformin  Psychiatric/Behavioral: Negative for depression, memory loss and suicidal ideas. The patient is not nervous/anxious and does not have insomnia.   All other systems reviewed and are negative.  Performance status (ECOG): 1 - Symptomatic but completely ambulatory  Vital signs BP 138/83 (BP Location: Left Arm, Patient Position: Sitting)   Pulse 76   Temp 98.2 F (36.8 C) (Tympanic)   Resp 18   Wt 140 lb 9 oz (63.8 kg)   BMI 21.37 kg/m   Physical Exam  Constitutional: She is  oriented to person, place, and time and well-developed, well-nourished, and in no distress.  HENT:  Head: Normocephalic and atraumatic.  Mouth/Throat: Oropharynx is clear and moist and mucous membranes are normal.  Eyes: Pupils are equal, round, and reactive to light. EOM are normal. No scleral icterus.  Neck: Normal range of motion. Neck supple. No tracheal deviation present. No thyromegaly present.  Cardiovascular: Normal rate, regular rhythm, normal heart sounds and intact distal pulses. Exam reveals no gallop and no friction rub.  No murmur heard. Pulmonary/Chest: Effort normal and breath sounds normal. No respiratory distress. She has no wheezes. She has no rales. Right breast exhibits skin change (post surgical changes) and tenderness. Right breast exhibits no inverted nipple, no mass and no nipple discharge. Left breast exhibits no inverted nipple, no mass, no nipple discharge, no skin change and no tenderness.  Abdominal: Soft. Bowel sounds are normal. She exhibits no distension. There is no tenderness.  Musculoskeletal: Normal range of motion. She exhibits no edema or tenderness.  Lymphadenopathy:    She has no cervical adenopathy.    She has no axillary adenopathy.       Right: No inguinal and no supraclavicular adenopathy present.       Left: No inguinal and no supraclavicular adenopathy present.  Neurological: She is alert and oriented to person, place, and time.  Skin: Skin is warm and dry. No rash noted. No erythema.  Psychiatric: Mood, affect and judgment normal.  Nursing note and vitals reviewed.   No visits with results within 3 Day(s) from this visit.  Latest known visit with results is:  Admission on 02/01/2018, Discharged on 02/01/2018  Component Date Value Ref Range Status  . Glucose-Capillary 02/01/2018 119* 70 - 99 mg/dL Final  . SURGICAL PATHOLOGY 02/01/2018    Final                   Value:Surgical Pathology CASE: [id number] PATIENT: Stephanie Sanders Surgical  Pathology Report     SPECIMEN SUBMITTED: A. Breast mass, right; excision B. Sentinel lymph node #1, right axilla C. Sentinel lymph node #2, right axilla D. Sentinel lymph node #3, right axilla E. Sentinel lymph node #4, right axilla F. Breast, right, anterior superior margin; re-excision  CLINICAL HISTORY: None provided  PRE-OPERATIVE DIAGNOSIS: Right breast cancer, triple negative, 2 adjacent sites upper-outer quadrant, metastatic to axillary lymph node, post neoadjuvant chemotherapy  POST-OPERATIVE  DIAGNOSIS: Same as pre-op     DIAGNOSIS: A. BREAST, RIGHT; MAMMOGRAPHICALLY-DIRECTED PARTIAL MASTECTOMY WITH WIRE LOCALIZATION: - RESIDUAL INVASIVE MAMMARY CARCINOMA, MULTIPLE FOCI, SEE COMMENT AND SUMMARY BELOW. - TWO BIOPSY SITES AND MARKER CLIPS PRESENT.  B. SENTINEL LYMPH NODE #1, RIGHT AXILLA; EXCISION WITH WIRE LOCALIZATION: - NEGATIVE FOR MALIGNANCY, ONE LYMPH NODE (0/1). -                          FIBROUS SCARRING, BIOPSY SITE CHANGES, AND MARKER CLIP PRESENT.  C. SENTINEL LYMPH NODE #2, RIGHT AXILLA; EXCISION: - NEGATIVE FOR MALIGNANCY, ONE LYMPH NODE (0/1).  D. SENTINEL LYMPH NODE #3, RIGHT AXILLA; EXCISION: - NEGATIVE FOR MALIGNANCY, ONE LYMPH NODE (0/1).  E. SENTINEL LYMPH NODE #4, RIGHT AXILLA; EXCISION: - NEGATIVE FOR MALIGNANCY, ONE LYMPH NODE (0/1).  F. BREAST, RIGHT, ANTERIOR SUPERIOR MARGIN; RE-EXCISION: - NEGATIVE FOR MALIGNANCY. - NEGATIVE FOR FIBROUS SCARRING AND BIOPSY SITE CHANGES.  Comment: The tumor bed encompasses the biopsy sites, with foci of residual carcinoma at both biopsy sites and also involving the tissue between the biopsy sites. Foci of residual carcinoma range from 1 to 10 mm.  The margins on part A are free of invasive carcinoma, with closest margins medial (1 mm) and superior (2 mm).  CANCER CASE SUMMARY: INVASIVE CARCINOMA OF THE BREAST Procedure: Excision Specimen Laterality: Right Tumor Size: 10 mm Histologic Type:  In                         vasive carcinoma of no special type Histologic Grade (Nottingham Histologic Score)                      Glandular (Acinar)/Tubular Differentiation: 3                      Nuclear Pleomorphism: 2                      Mitotic Rate: 2                      Overall Grade: 2 Ductal Carcinoma In Situ (DCIS): Not identified  Margins:                      Invasive Carcinoma Margins: Uninvolved by invasive carcinoma                      Distance from closest margin: 1 mm                      Specify closest margin: Medial                       DCIS Margins: Not applicable  Regional Lymph Nodes: Uninvolved by tumor cells Number of Lymph Nodes Examined: 4 Number of Sentinel Nodes Examined: 4  Treatment Effect in the Breast: Probable response to presurgical therapy in the invasive carcinoma Treatment Effect in the Lymph Nodes: No lymph node metastases.  Fibrous scarring in 1 of 4 lymph nodes consistent with pathologic complete response.  Residual Cancer Burden (RCB) from [facility] Flow calculator (website below):      Primary Tumor Bed  Primary tumor bed: 24 mm x 10 mm           Overall cancer cellularity: 5%           Percentage of cancer that is in situ: 0%      Lymph nodes           Number of lymph nodes positive for metastasis: 0           Diameter of largest metastasis: 0 mm      Residual Cancer Burden: 1.341      Residual Cancer Burden Class: RCB-I  [email or web address]      (accessed on 02/04/2018 by Blain Pais, MD)  Lymphovascular Invasion: Not identified Pathologic Stage Classification (pTNM, AJCC 8th Edition): ypT1b(m) ypN0(sn) TNM Descriptors: m (multiple foci of invasive carcinoma), y (posttreatment)  BREAST BIOMARKER TESTS PERFORMED ON PREVIOUS CORE BIOPSY ([id number], collected 05/28/17, slides were reviewed) Estrogen Receptor (ER) Status: NEGATIVE, <1% weak  nuclear staining Progesterone Receptor (PR) Status: NEGATIVE HER2 (by immunohistochemistry): Equivocal, 2                         + HER2 (by in situ hybridization): NEGATIVE, number of HER2 signals per cell, HER2/CEP17 ratio 1.00   GROSS DESCRIPTION: A.  Intraoperative Consultation:     Labeled: Right breast mass     Received: Fresh     Specimen: Lumpectomy     Pathologic evaluation performed: Immediate gross evaluation of margins     Diagnosis: Gross only:     - Lesion measures 5 mm from the nearest (anterior/superior) margin     Communicated to: Dr. Windell Moment at 2:52 PM on 02/01/2018, Galvin Proffer MD     Tissue submitted: Not applicable  B.  Intraoperative Consultation:     Labeled: Right axillary lymph node #1     Received: Fresh     Specimen: Lymph node with localizing wire     Pathologic evaluation performed: Touch preparation     Diagnosis: Touch prep:     - No macrometastasis identified.  Scant lymphoid tissue present.     Communicated to: Dr. Windell Moment at 3:02 PM on 02/01/2018, Galvin Proffer, MD     Tissue submitted: B1  C. Intraoperative Consultation:     Labeled: Rig                         ht axillary lymph node #2     Received: Fresh     Specimen: Lymph node     Pathologic evaluation performed: Touch preparation     Diagnosis: TP C:     - No macrometastasis identified     Communicated to: Dr. Windell Moment at 3:29 PM on 02/01/2018, Galvin Proffer, MD     Tissue submitted: C1  D. Intraoperative Consultation:     Labeled: Right axillary lymph node #3     Received: Fresh     Specimen: Lymph node     Pathologic evaluation performed: Touch preparation     Diagnosis: TP D:     - No macrometastasis identified     Communicated to: Dr. Windell Moment at 3:29 PM on 02/01/2018, Galvin Proffer, MD     Tissue submitted: D1  E. Intraoperative Consultation:     Labeled: Right axillary lymph node #4     Received: Fresh     Specimen: Lymph node     Pathologic  evaluation performed: Touch preparation     Diagnosis: TP E:    -  No macrometastasis identified     Communicated to: Dr. Windell Moment at 3:29 PM on 02/01/2018, Galvin Proffer, MD     Tissue submitted: E1                           A. Labeled: Right breast mass Received: Fresh Accompanying specimen radiograph: Yes Radiographic findings: two localizing wires extending towards medial from lateral and two metallic biopsy site marker clips Time in fixative: 3:02 PM on 02/01/2018  Cold ischemic time: 45 minutes Total fixation time: 26 hours Type of procedure: Lumpectomy Location/laterality of specimen: Right upper-outer quadrant Orientation of specimen: Cranial and medial metallic marker Inking: Superior = green Inferior = blue Medial = yellow Lateral = orange Posterior = black Anterior/Superficial = red Size of specimen: 8.3 ((medial-lateral) by 6.7 (superior-inferior) by 4.1 (anterior-posterior) centimeter Skin: Thin fragment of wrinkled tan skin 2.3 x 0.5 cm, anterior and lateral Biopsy site: two biopsy clips and possible mass areas  Number of discrete masses: two slightly ill-defined and directly adjacent Size of masses: A-0.9 x 0.8 x 0.8 cm with coil clip; B-1.0 x 0.8 x 0.6 cm Descrip                         tion of mass(es): Both ill-defined firm with surrounding dense fibrous tissue Distance between masses/clips: 0.6 cm Margins: A-0.1 cm from superior, 0.3 cm from anterior, 3.1 cm from inferior, 2.5 cm from deep, 2.3 cm from medial and 3.1 cm from lateral B-1.0 cm from inferior, 2.1 cm from superior, 1 cm from anterior, 1.9 cm from deep, 3.0 cm from lateral and 2.8 cm from medial Description of remainder of tissue: Dense firm fibrous tissue with chalky yellow-calcified dilated spaces abutting inferior margin  Block summary: 1-21 - entire area with masses and surrounding firm fibrous and calcified areas submitted consecutively lateral to medial sequentially (1-2, 3-4,  5-7, 8-10, 11-13 (cassette 11 containing coil clip mass A), 14-16 ,17-19, 20-21 represent one section divided) 22 - perpendicular medial 23 - perpendicular lateral 25 - perpendicular deep  B. Labeled: Right axillary sentinel lymph node #1 Received: Fresh Tissue fragment(s): 1 Size: 2.5 x 1.5 x 0.5 cm D                         escription: Predominantly fatty replaced lymph node candidate with localizing wire, bisected, central hemorrhage and a rim of blue dye, touch imprints prepared, following touch preparation formalin fixed, further sectioned to reveal metallic marker Entirely submitted in two cassettes.  C. Labeled: Right axillary lymph node #2 Received: Fresh Tissue fragment(s): 1 Size: 0.6 x 0.5 x 0.4 cm Description: Predominantly fatty replaced lymph node, bisected and touch preparation prepared, following touch preparation formalin fixed Entirely submitted in one cassette.  D. Labeled: Right axillary lymph node #3 Received: Fresh Tissue fragment(s): 1 Size: 1.2 x 0.8 x 0.4 cm Description: Predominantly fatty replaced lymph node, bisected and touch preparation prepared, following touch preparation formalin fixed Entirely submitted in one cassette.  E. Labeled: Right axillary lymph node #4 Received: Fresh Tissue fragment(s): 1 Size: 1.2 x 0.6 x 0.4 cm Description: Predominantl                         y fatty replaced lymph node candidate, bisected and touch preparation prepared, following touch preparation formalin fixed Entirely submitted in one cassette.  F. Labeled: Anterior/superior margin  right breast Received: Fresh Accompanying specimen radiograph: No Radiographic findings: Not applicable Time in fixative: 3:35 PM on 02/01/2018  Cold ischemic time: 15 minutes Total fixation time: 25.5 hours Type of procedure: Lumpectomy Location/laterality of specimen: Right Orientation of specimen: Single stitch inside cavity, double short stitch skin and double long  lateral Inking: Superior = blue Inferior = green (single stitch inside cavity) Medial = yellow Lateral = orange Posterior = black Anterior/Superficial = red Size of specimen: 6.7 (medial-lateral) by 2.9 (anterior from posterior) by 2.1 (superior-inferior) centimeter Skin: Not present Biopsy site: No mass identified, biopsy site marked with single stitch  Number of discrete masses: 0 Description of                          remainder of tissue: Yellow lobulated fibrous and fatty  Block summary: 1-7  representative sections submitted from medial towards lateral   Final Diagnosis performed by Bryan Lemma, MD.   Electronically signed 02/04/2018 5:22:04PM The electronic signature indicates that the named Attending Pathologist has evaluated the specimen  Technical component performed at [facility], [address] [place], [place] [address] Lab: [phone] Dir: Rush Farmer, MD, MMM  Professional component performed at [facility], [facility], [place], [place], [place] [address] Lab: [phone] Dir: Dellia Nims. Rubinas, MD   . Glucose-Capillary 02/01/2018 128* 70 - 99 mg/dL Final    Assessment:  Stephanie Sanders is a 72 y.o. 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  Pathology revealed two grade II invasive mammary carcinomas of no special type (4 cm from the nipple and 5 cm from the nipple).  Tumors were in close proximity.  Lymph node biopsy confirmed metastatic carcinoma of breast origin.  Tumor is ER negative (< 1%), PR negative and Her 2/neu 2+ by IHC.  [facility] genetic testing on 07/21/2017 revealed a variant of uncertain significance in RECQL4.  Right sided mammogram and ultrasound on 05/21/2017 revealed 2 highly suspicious masses in the right breast at the 10:30 position 4 cm from nipple (1.8 x 1.2 x 1.1 cm) and at the 10:30 position 5 cm from nipple (1.1 x 0.9 x 0.9 cm).  There was a suspicious  2.7 x 1 x 2.2 cm lymph node in the right axilla with asymmetric nodular cortical thickening.  Ultrasound guided biopsy of the 2 masses and lymph node on 05/28/2017 revealed grade II invasive mammary carcinoma of no special type (4 cm from the nipple and 5 cm from the nipple).  Lymph node biopsy revealed metastatic carcinoma of breast origin.  Tumor is ER negative (< 1%), PR negative and Her 2/neu 2+ by IHC.  FISH was negative.  CA 27.29 was 16.5 and CA15-3 was 17.6 on 06/04/2017.  Echo on 06/09/2017 revealed an EF of 60-65%.  She has enrolled on the UPBEAT clinical trial.  She received 4 cycles of AC (06/23/2017 - 09/08/2017) with Udencya support.  Cycle #1 was complicated by influenza A.  Nadir counts included an ANC of 0 and platelet count of 71,000. Cycle #2 postponed due to slow recovery from influenza and excisional cyst removal with surrounding cellulitis (treated with 10 days of doxycycline).   She received 12 weeks of  Taxol (09/29/2017 - 11/10/2017; 11/24/2017 - 12/08/2017; 12/22/2017 - 01/05/2018).  She received GCSF after week #3 and 5 Taxol secondary to borderline counts. Cycle #5 was postponed on 10/29/2017 due to patient's wishes to be treated on Tuesdays  only. Week #7 Taxol was held secondary to neutropenia (ANC 200).  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  Normal labs on 11/24/2017 included:  B12, folate, TSH, and copper.  Right mammogram and ultrasound on 09/28/2017 revealed the 2 adjacent masses in the right breast at the 10:30 o'clock position had both decreased in size (1.8 x 1.1 x 1.2 cm to 1.2 x 0.7 x 1.1 cm; 11 x 9 x 9 mm to 7 x 6 x 6 mm) as well as the metastatic right axillary lymph node (2.7 cm to 1.6 x 0.9 x 1.7 cm).    Right partial mastectomy and sentinel lymph node biopsy on 02/01/2018 Pathology revealed 10 mm grade II invasive carcinoma of no special type.  The tumor bed encompassed the biopsy sites, with foci of residual  carcinoma at both biopsy sites and also  involving the tissue between the  biopsy sites. Foci of residual carcinoma range from 1 to 10 mm.There was no DCIS.  Distance from closest margin was 1 mm.  Four of 4 lymph nodes were negative.  Pathologic stage was ypT1b (m) ypN0 (sn).  She was admitted to [facility] from 06/28/2017 - 07/01/2017 with with fever, cough and generalized weakness.  Nasal swab was + for influenza A.  She completed a course of Tamiflu.  Symptomatically, patient is recovering well from her partial mastectomy.  Energy remains low.  Diarrhea has resolved, however patient is now constipated.  No B symptoms or recent infections.  Exam reveals postsurgical changes to the right breast.  Plan: 1. Multi-focal triple negative right breast cancer  Discuss pathology s/p partial mastectomy.  Discuss plans for radiation therapy s/p partial mastectomy.  Discuss consideration of Xeloda for adjuvant therapy.  Discuss the CREATE-X trial:   900 patients with HER2- breast cancer (1/3 with triple-negative disease) and residual disease after neoadjuvant chemotherapy   Patients received 8 cycles of adjuvant Xeloda (2 weeks on/1 week off) vs no further therapy.   Overall 5 year survival was 74% vs 68% and OS was 89% vs 84%.  Subgroup analysis revealed benefit in triple negative patients (DFS 70% vs 56% in the control group and OS 78.8% vs 70.3%.   Side effects:  diarrhea, neutropenia, and hand-foot syndrome. 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 She notes that her neuropathy does not impose any functional limitations, nor does it impede her ability to ambulate safely. 3. Constipation  Varying bowel habits.  Encouraged OTC interventions, in addition to increase fluid and fiber intake.  Discussed increasing activity ad lib. 4. Influenza prophylaxis  Patient requesting annual influenza vaccination. Discussed risks and benefits of vaccination. Blood counts reviewed and found to be  adequate enough for patient to proceed with vaccination. Orders placed for Fluzone high dose vaccination to be administered today in clinic. 5. Continue venous access device (port) flushes every 4 to 6 weeks. 6. RTC after radiation therapy for MD assessment and to further discuss direction of therapy.   Honor Loh, NP 02/19/2018,11:20 AM   I saw and evaluated the patient, participating in the key portions of the service and reviewing pertinent diagnostic studies and records.  I reviewed the nurse practitioner's note and agree with the findings and the plan.  The assessment and plan were discussed with the patient.  Multiple questions were asked by the patient and answered.   Nolon Stalls, MD 02/19/2018,11:20 AM

## 2018-02-24 ENCOUNTER — Encounter: Payer: Self-pay | Admitting: Radiation Oncology

## 2018-02-24 ENCOUNTER — Ambulatory Visit
Admission: RE | Admit: 2018-02-24 | Discharge: 2018-02-24 | Disposition: A | Discharge status: Home / Self Care | Payer: Medicare Other | Source: Ambulatory Visit | Attending: Radiation Oncology | Admitting: Radiation Oncology

## 2018-02-24 ENCOUNTER — Other Ambulatory Visit: Payer: Self-pay

## 2018-02-24 VITALS — BP 142/71 | HR 81 | Temp 99.0°F | Resp 18 | Wt 143.1 lb

## 2018-02-24 DIAGNOSIS — Z171 Estrogen receptor negative status [ER-]: Secondary | ICD-10-CM | POA: Diagnosis not present

## 2018-02-24 DIAGNOSIS — Z87891 Personal history of nicotine dependence: Secondary | ICD-10-CM | POA: Insufficient documentation

## 2018-02-24 DIAGNOSIS — I10 Essential (primary) hypertension: Secondary | ICD-10-CM | POA: Insufficient documentation

## 2018-02-24 DIAGNOSIS — L723 Sebaceous cyst: Secondary | ICD-10-CM | POA: Insufficient documentation

## 2018-02-24 DIAGNOSIS — E559 Vitamin D deficiency, unspecified: Secondary | ICD-10-CM | POA: Insufficient documentation

## 2018-02-24 DIAGNOSIS — Z801 Family history of malignant neoplasm of trachea, bronchus and lung: Secondary | ICD-10-CM | POA: Diagnosis not present

## 2018-02-24 DIAGNOSIS — C50411 Malignant neoplasm of upper-outer quadrant of right female breast: Secondary | ICD-10-CM | POA: Insufficient documentation

## 2018-02-24 DIAGNOSIS — Z79899 Other long term (current) drug therapy: Secondary | ICD-10-CM | POA: Insufficient documentation

## 2018-02-24 DIAGNOSIS — Z7984 Long term (current) use of oral hypoglycemic drugs: Secondary | ICD-10-CM | POA: Insufficient documentation

## 2018-02-24 DIAGNOSIS — R011 Cardiac murmur, unspecified: Secondary | ICD-10-CM | POA: Insufficient documentation

## 2018-02-24 DIAGNOSIS — Z9221 Personal history of antineoplastic chemotherapy: Secondary | ICD-10-CM | POA: Diagnosis not present

## 2018-02-24 DIAGNOSIS — E039 Hypothyroidism, unspecified: Secondary | ICD-10-CM | POA: Insufficient documentation

## 2018-02-24 DIAGNOSIS — C50911 Malignant neoplasm of unspecified site of right female breast: Secondary | ICD-10-CM

## 2018-02-24 DIAGNOSIS — E1140 Type 2 diabetes mellitus with diabetic neuropathy, unspecified: Secondary | ICD-10-CM | POA: Diagnosis not present

## 2018-02-24 NOTE — Consult Note (Signed)
NEW PATIENT EVALUATION  Name: Stephanie Sanders  MRN: [id number]  Date:   02/24/2018     DOB: 07/22/1945   This 72 y.o. 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  REFERRING PHYSICIAN: Baxter Hire, MD  CHIEF COMPLAINT:  Chief Complaint  Patient presents with  . Breast Cancer    Pt is here for initial consultation of breast cancer.      DIAGNOSIS: The encounter diagnosis was Malignant neoplasm of right breast in female, estrogen receptor negative, unspecified site of breast ([place]).   PREVIOUS INVESTIGATIONS:  Pathology reports reviewed Mammograms ultrasound reviewed Clinical notes reviewed  HPI: atient is a 72 year old female who presented with an abnormal mammogram of herright breast showing2 indeterminate nodules in the left breast at the 2:30 and 2:00 position.ultrasound confirmed these 2 nodules as well as abnormal lymph node in the right axilla. Biopsies of both site shows invasive triple negative breast cancer with involvement of theright axillary lymph node measuring 2.7 cm in greatest dimension.patient was enrolled on the upbeat clinical trialshe received 4 cycles of Cytoxan and Adriamycin consultative by influenza A. She also received 12 weeks of Taxol with colony-stimulating factor. After completion of chemotherapy she underwent a wide local excision showing a residual invasive mammary carcinoma as well as 4 sentinel lymph nodes all negative for malignancy.residual tumor measured 1 cm.tumor was overall grade 2. Margins were clear but close at 1 mm.one of 4 lymph nodes showed scarring consistent with treatment effect.residual tumor burden RCB 1. She has done well postoperatively and is healing well. She also had a sebaceous cyst present which was excised near the sternum. Specifically  denies breast tenderness cough or bone pain. She seen today for radiation oncology opinion.  PLANNED TREATMENT REGIMEN: right breast and peripheral lymphatic radiation  PAST MEDICAL HISTORY:  has a past medical history of Cancer ([place]) (04/2017), Diabetes mellitus without complication ([place]), Diabetic nephropathy ([place]), Headache, Heart murmur, Hypertension, Hypothyroidism, Personal history of chemotherapy (2019), Pneumonia, Seborrheic keratosis, and Vitamin D deficiency.    PAST SURGICAL HISTORY:  Past Surgical History:  Procedure Laterality Date  . APPENDECTOMY    . AXILLARY LYMPH NODE DISSECTION Right 02/01/2018   Procedure: AXILLARY LYMPH NODE DISSECTION;  Surgeon: Herbert Pun, MD;  Location: [facility] ORS;  Service: General;  Laterality: Right;  . BREAST BIOPSY Right 05/28/2017   right breast bx 3 areas invasive mamm ca lymph node mets  . BREAST CYST ASPIRATION Bilateral    neg  . BREAST LUMPECTOMY Right 02/01/2018  . CHOLECYSTECTOMY    . COLONOSCOPY WITH PROPOFOL N/A 01/01/2015   Procedure: COLONOSCOPY WITH PROPOFOL;  Surgeon: Josefine Class, MD;  Location: [facility] ENDOSCOPY;  Service: Endoscopy;  Laterality: N/A;  . EYE SURGERY Left    eyelid  . PARTIAL MASTECTOMY WITH NEEDLE LOCALIZATION Right 02/01/2018   Procedure: PARTIAL MASTECTOMY WITH NEEDLE LOCALIZATION;  Surgeon: Herbert Pun, MD;  Location: [facility] ORS;  Service: General;  Laterality: Right;  . PORTACATH PLACEMENT Right 06/10/2017   Procedure: INSERTION PORT-A-CATH;  Surgeon: Herbert Pun, MD;  Location: [facility] ORS;  Service: General;  Laterality: Right;  . SENTINEL NODE BIOPSY Right 02/01/2018   Procedure: SENTINEL NODE BIOPSY;  Surgeon: Herbert Pun, MD;  Location: [facility] ORS;  Service: General;  Laterality: Right;  . STENT PLACEMENT ILIAC ([place] HX)     temporary placement s/p cholecystectomy  .  TUBAL LIGATION      FAMILY HISTORY: family history includes Cancer in her father and maternal aunt;  Lung cancer in her father; Parkinson's disease in her mother.  SOCIAL HISTORY:  reports that she quit smoking about 45 years ago. Her smoking use included cigarettes. She has a 20.00 pack-year smoking history. She has never used smokeless tobacco. She reports that she does not drink alcohol or use drugs.  ALLERGIES: Fiorinal [butalbital-aspirin-caffeine]; Sulfa antibiotics; Tramadol; and Other  MEDICATIONS:  Current Outpatient Medications  Medication Sig Dispense Refill  . acetaminophen (TYLENOL) 500 MG tablet Take 1,000 mg by mouth every 6 (six) hours as needed for moderate pain or headache.     Marland Kitchen atorvastatin (LIPITOR) 10 MG tablet Take 10 mg by mouth every evening.     . Cholecalciferol 2000 UNITS CAPS Take 4,000 Units by mouth daily.     . colesevelam (WELCHOL) 625 MG tablet Take 1,875 mg by mouth every evening.     Marland Kitchen levothyroxine (SYNTHROID, LEVOTHROID) 50 MCG tablet Take 50 mcg by mouth daily before breakfast.    . lidocaine-prilocaine (EMLA) cream Apply 1 application topically as needed. (Patient taking differently: Apply 1 application topically as needed (for port access). ) 30 g 6  . LORazepam (ATIVAN) 0.5 MG tablet Take 1 tablet (0.5 mg total) by mouth every 6 (six) hours as needed (Nausea or vomiting). 30 tablet 0  . MAGNESIUM GLYCINATE PLUS PO Take 1 tablet by mouth at bedtime.     . Melatonin 5 MG TABS Take 5 mg by mouth at bedtime.     . metFORMIN (GLUCOPHAGE) 500 MG tablet Take 500 mg by mouth 2 (two) times daily.     . quinapril (ACCUPRIL) 5 MG tablet Take 5 mg by mouth every morning.     . sodium bicarbonate 650 MG tablet Take 1,300 mg by mouth at bedtime.     No current facility-administered medications for this encounter.    Facility-Administered Medications Ordered in Other Encounters  Medication Dose Route Frequency Provider Last Rate Last Dose  . sodium chloride flush (NS) 0.9 % injection 10 mL  10 mL Intravenous PRN Nolon Stalls C, MD   10 mL at 10/21/17 0841     ECOG PERFORMANCE STATUS:  0 - Asymptomatic  REVIEW OF SYSTEMS:  Patient denies any weight loss, fatigue, weakness, fever, chills or night sweats. Patient denies any loss of vision, blurred vision. Patient denies any ringing  of the ears or hearing loss. No irregular heartbeat. Patient denies heart murmur or history of fainting. Patient denies any chest pain or pain radiating to her upper extremities. Patient denies any shortness of breath, difficulty breathing at night, cough or hemoptysis. Patient denies any swelling in the lower legs. Patient denies any nausea vomiting, vomiting of blood, or coffee ground material in the vomitus. Patient denies any stomach pain. Patient states has had normal bowel movements no significant constipation or diarrhea. Patient denies any dysuria, hematuria or significant nocturia. Patient denies any problems walking, swelling in the joints or loss of balance. Patient denies any skin changes, loss of hair or loss of weight. Patient denies any excessive worrying or anxiety or significant depression. Patient denies any problems with insomnia. Patient denies excessive thirst, polyuria, polydipsia. Patient denies any swollen glands, patient denies easy bruising or easy bleeding. Patient denies any recent infections, allergies or URI. Patient "s visual fields have not changed significantly in recent time.    PHYSICAL EXAM: BP (!) 142/71 (BP Location: Left Arm, Patient  Position: Sitting)   Pulse 81   Temp 99 F (37.2 C) (Tympanic)   Resp 18   Wt 143 lb 1.3 oz (64.9 kg)   BMI 21.76 kg/m  On physical exam she status post wide local excision the right breast at the 8:00 position which is healed well. She has also had an excision near the sternum which is also well-healed. No dominant mass or nodularity is noted in either breast in 2 positions examined. No axillary or supraclavicular adenopathy is identified.Well-developed well-nourished patient in NAD. HEENT reveals PERLA,  EOMI, discs not visualized.  Oral cavity is clear. No oral mucosal lesions are identified. Neck is clear without evidence of cervical or supraclavicular adenopathy. Lungs are clear to A&P. Cardiac examination is essentially unremarkable with regular rate and rhythm without murmur rub or thrill. Abdomen is benign with no organomegaly or masses noted. Motor sensory and DTR levels are equal and symmetric in the upper and lower extremities. Cranial nerves II through XII are grossly intact. Proprioception is intact. No peripheral adenopathy or edema is identified. No motor or sensory levels are noted. Crude visual fields are within normal range.  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 Although it may be question to treat her peripheral lymphatics for negative sentinel lymph nodes based on the multifocal nature of her disease the triple negative nature of her disease and fairly large initial lymph node involvement and not having a complete axillary clearing I believe with minimal additional side effect peripheral lymphatic radiation would be indicated. I would plan on delivering 5040 cGy to both right breast and peripheral lymphatics. I will also boost her scar another 1600 cGy based on the close margin of 1 mm. Risks and benefits of treatment including skin reaction fatigue alteration of blood counts and inclusion of superficial lung all were discussed in detail with the patient.There will be extra effort by both professional staff as well as technical staff to coordinate and manage  concurrent chemoradiation and ensuing side effects during  Treatments.I personally set up and scheduled her for CT simulation next week. Patient seems to comprehend my treatment plan well.  I would like to take this opportunity to thank you for allowing me to participate in the care of your patient.Noreene Filbert, MD

## 2018-02-24 NOTE — Addendum Note (Signed)
Encounter addended by: Noreene Filbert, MD on: 02/24/2018 3:08 PM  Actions taken: LOS modified

## 2018-02-28 ENCOUNTER — Encounter: Payer: Self-pay | Admitting: Hematology and Oncology

## 2018-03-02 ENCOUNTER — Ambulatory Visit
Admission: RE | Admit: 2018-03-02 | Discharge: 2018-03-02 | Disposition: A | Discharge status: Home / Self Care | Payer: Medicare Other | Source: Ambulatory Visit | Attending: Radiation Oncology | Admitting: Radiation Oncology

## 2018-03-02 DIAGNOSIS — Z510 Encounter for antineoplastic radiation therapy: Secondary | ICD-10-CM | POA: Insufficient documentation

## 2018-03-02 DIAGNOSIS — C50411 Malignant neoplasm of upper-outer quadrant of right female breast: Secondary | ICD-10-CM | POA: Diagnosis present

## 2018-03-02 DIAGNOSIS — Z171 Estrogen receptor negative status [ER-]: Secondary | ICD-10-CM | POA: Insufficient documentation

## 2018-03-05 ENCOUNTER — Other Ambulatory Visit: Payer: Self-pay | Admitting: *Deleted

## 2018-03-05 DIAGNOSIS — C50911 Malignant neoplasm of unspecified site of right female breast: Secondary | ICD-10-CM

## 2018-03-05 DIAGNOSIS — Z171 Estrogen receptor negative status [ER-]: Principal | ICD-10-CM

## 2018-03-08 DIAGNOSIS — Z510 Encounter for antineoplastic radiation therapy: Secondary | ICD-10-CM | POA: Diagnosis not present

## 2018-03-09 ENCOUNTER — Ambulatory Visit
Admission: RE | Admit: 2018-03-09 | Discharge: 2018-03-09 | Disposition: A | Discharge status: Home / Self Care | Payer: Medicare Other | Source: Ambulatory Visit | Attending: Radiation Oncology | Admitting: Radiation Oncology

## 2018-03-09 DIAGNOSIS — Z510 Encounter for antineoplastic radiation therapy: Secondary | ICD-10-CM | POA: Diagnosis not present

## 2018-03-10 ENCOUNTER — Ambulatory Visit
Admission: RE | Admit: 2018-03-10 | Discharge: 2018-03-10 | Disposition: A | Discharge status: Home / Self Care | Payer: Medicare Other | Source: Ambulatory Visit | Attending: Radiation Oncology | Admitting: Radiation Oncology

## 2018-03-10 DIAGNOSIS — Z510 Encounter for antineoplastic radiation therapy: Secondary | ICD-10-CM | POA: Diagnosis not present

## 2018-03-11 ENCOUNTER — Ambulatory Visit
Admission: RE | Admit: 2018-03-11 | Discharge: 2018-03-11 | Disposition: A | Discharge status: Home / Self Care | Payer: Medicare Other | Source: Ambulatory Visit | Attending: Radiation Oncology | Admitting: Radiation Oncology

## 2018-03-11 DIAGNOSIS — Z510 Encounter for antineoplastic radiation therapy: Secondary | ICD-10-CM | POA: Diagnosis not present

## 2018-03-12 ENCOUNTER — Ambulatory Visit
Admission: RE | Admit: 2018-03-12 | Discharge: 2018-03-12 | Disposition: A | Discharge status: Home / Self Care | Payer: Medicare Other | Source: Ambulatory Visit | Attending: Radiation Oncology | Admitting: Radiation Oncology

## 2018-03-12 DIAGNOSIS — Z510 Encounter for antineoplastic radiation therapy: Secondary | ICD-10-CM | POA: Diagnosis not present

## 2018-03-15 ENCOUNTER — Ambulatory Visit
Admission: RE | Admit: 2018-03-15 | Discharge: 2018-03-15 | Disposition: A | Discharge status: Home / Self Care | Payer: Medicare Other | Source: Ambulatory Visit | Attending: Radiation Oncology | Admitting: Radiation Oncology

## 2018-03-15 DIAGNOSIS — Z510 Encounter for antineoplastic radiation therapy: Secondary | ICD-10-CM | POA: Diagnosis not present

## 2018-03-16 ENCOUNTER — Ambulatory Visit
Admission: RE | Admit: 2018-03-16 | Discharge: 2018-03-16 | Disposition: A | Discharge status: Home / Self Care | Payer: Medicare Other | Source: Ambulatory Visit | Attending: Radiation Oncology | Admitting: Radiation Oncology

## 2018-03-16 DIAGNOSIS — Z510 Encounter for antineoplastic radiation therapy: Secondary | ICD-10-CM | POA: Diagnosis not present

## 2018-03-17 ENCOUNTER — Ambulatory Visit
Admission: RE | Admit: 2018-03-17 | Discharge: 2018-03-17 | Disposition: A | Discharge status: Home / Self Care | Payer: Medicare Other | Source: Ambulatory Visit | Attending: Radiation Oncology | Admitting: Radiation Oncology

## 2018-03-17 DIAGNOSIS — Z510 Encounter for antineoplastic radiation therapy: Secondary | ICD-10-CM | POA: Diagnosis not present

## 2018-03-22 ENCOUNTER — Ambulatory Visit: Payer: Medicare Other

## 2018-03-23 ENCOUNTER — Ambulatory Visit
Admission: RE | Admit: 2018-03-23 | Discharge: 2018-03-23 | Disposition: A | Discharge status: Home / Self Care | Payer: Medicare Other | Source: Ambulatory Visit | Attending: Gastroenterology | Admitting: Gastroenterology

## 2018-03-23 ENCOUNTER — Other Ambulatory Visit: Payer: Self-pay | Admitting: Gastroenterology

## 2018-03-23 ENCOUNTER — Ambulatory Visit: Payer: Medicare Other

## 2018-03-23 DIAGNOSIS — R1031 Right lower quadrant pain: Secondary | ICD-10-CM

## 2018-03-23 DIAGNOSIS — R1032 Left lower quadrant pain: Secondary | ICD-10-CM

## 2018-03-23 LAB — POCT I-STAT CREATININE: Creatinine, Ser: 0.8 mg/dL (ref 0.44–1.00)

## 2018-03-23 MED ORDER — IOHEXOL 300 MG/ML  SOLN
100.0000 mL | Freq: Once | INTRAMUSCULAR | Status: AC | PRN
  Administered 2018-03-23: 100 mL via INTRAVENOUS

## 2018-03-24 ENCOUNTER — Ambulatory Visit: Payer: Medicare Other

## 2018-03-25 ENCOUNTER — Inpatient Hospital Stay: Payer: Medicare Other | Attending: Radiation Oncology

## 2018-03-25 ENCOUNTER — Ambulatory Visit
Admission: RE | Admit: 2018-03-25 | Discharge: 2018-03-25 | Disposition: A | Discharge status: Home / Self Care | Payer: Medicare Other | Source: Ambulatory Visit | Attending: Radiation Oncology | Admitting: Radiation Oncology

## 2018-03-25 DIAGNOSIS — Z510 Encounter for antineoplastic radiation therapy: Secondary | ICD-10-CM | POA: Diagnosis not present

## 2018-03-25 DIAGNOSIS — C50211 Malignant neoplasm of upper-inner quadrant of right female breast: Secondary | ICD-10-CM

## 2018-03-25 DIAGNOSIS — C50411 Malignant neoplasm of upper-outer quadrant of right female breast: Secondary | ICD-10-CM | POA: Insufficient documentation

## 2018-03-25 DIAGNOSIS — G620 Drug-induced polyneuropathy: Secondary | ICD-10-CM | POA: Insufficient documentation

## 2018-03-25 DIAGNOSIS — C50911 Malignant neoplasm of unspecified site of right female breast: Secondary | ICD-10-CM | POA: Diagnosis present

## 2018-03-25 DIAGNOSIS — Z171 Estrogen receptor negative status [ER-]: Secondary | ICD-10-CM | POA: Diagnosis present

## 2018-03-25 DIAGNOSIS — C171 Malignant neoplasm of jejunum: Secondary | ICD-10-CM | POA: Diagnosis present

## 2018-03-25 LAB — COMPREHENSIVE METABOLIC PANEL
ALT: 17 U/L (ref 0–44)
AST: 17 U/L (ref 15–41)
Albumin: 4 g/dL (ref 3.5–5.0)
Alkaline Phosphatase: 65 U/L (ref 38–126)
Anion gap: 7 (ref 5–15)
BUN: 13 mg/dL (ref 8–23)
CO2: 27 mmol/L (ref 22–32)
Calcium: 9.2 mg/dL (ref 8.9–10.3)
Chloride: 105 mmol/L (ref 98–111)
Creatinine, Ser: 0.66 mg/dL (ref 0.44–1.00)
GFR calc Af Amer: >60 mL/min (ref >60)
GFR calc non Af Amer: >60 mL/min (ref >60)
Glucose, Bld: 161 mg/dL — ABNORMAL HIGH (ref 70–99)
Potassium: 4 mmol/L (ref 3.5–5.1)
Sodium: 139 mmol/L (ref 135–145)
Total Bilirubin: 0.6 mg/dL (ref 0.3–1.2)
Total Protein: 6.6 g/dL (ref 6.5–8.1)

## 2018-03-25 LAB — CBC WITH DIFFERENTIAL/PLATELET
Abs Immature Granulocytes: 0.01 10*3/uL (ref 0.00–0.07)
Basophils Absolute: 0 10*3/uL (ref 0.0–0.1)
Basophils Relative: 0 %
Eosinophils Absolute: 0.3 10*3/uL (ref 0.0–0.5)
Eosinophils Relative: 5 %
HCT: 36.3 % (ref 36.0–46.0)
Hemoglobin: 12 g/dL (ref 12.0–15.0)
Immature Granulocytes: 0 %
Lymphocytes Relative: 20 %
Lymphs Abs: 0.9 10*3/uL (ref 0.7–4.0)
MCH: 30.2 pg (ref 26.0–34.0)
MCHC: 33.1 g/dL (ref 30.0–36.0)
MCV: 91.4 fL (ref 80.0–100.0)
Monocytes Absolute: 0.5 10*3/uL (ref 0.1–1.0)
Monocytes Relative: 10 %
Neutro Abs: 2.9 10*3/uL (ref 1.7–7.7)
Neutrophils Relative %: 65 %
Platelets: 176 10*3/uL (ref 150–400)
RBC: 3.97 MIL/uL (ref 3.87–5.11)
RDW: 12.8 % (ref 11.5–15.5)
WBC: 4.6 10*3/uL (ref 4.0–10.5)
nRBC: 0 % (ref 0.0–0.2)

## 2018-03-25 MED ORDER — SODIUM CHLORIDE 0.9% FLUSH
10.0000 mL | Freq: Once | INTRAVENOUS | Status: AC
  Administered 2018-03-25: 10 mL via INTRAVENOUS
  Filled 2018-03-25: qty 10

## 2018-03-25 MED ORDER — HEPARIN SOD (PORK) LOCK FLUSH 100 UNIT/ML IV SOLN
500.0000 [IU] | Freq: Once | INTRAVENOUS | Status: AC
  Administered 2018-03-25: 500 [IU] via INTRAVENOUS
  Filled 2018-03-25: qty 5

## 2018-03-26 ENCOUNTER — Ambulatory Visit
Admission: RE | Admit: 2018-03-26 | Discharge: 2018-03-26 | Disposition: A | Discharge status: Home / Self Care | Payer: Medicare Other | Source: Ambulatory Visit | Attending: Radiation Oncology | Admitting: Radiation Oncology

## 2018-03-26 DIAGNOSIS — Z510 Encounter for antineoplastic radiation therapy: Secondary | ICD-10-CM | POA: Diagnosis not present

## 2018-03-29 ENCOUNTER — Ambulatory Visit
Admission: RE | Admit: 2018-03-29 | Discharge: 2018-03-29 | Disposition: A | Discharge status: Home / Self Care | Payer: Medicare Other | Source: Ambulatory Visit | Attending: Radiation Oncology | Admitting: Radiation Oncology

## 2018-03-29 DIAGNOSIS — Z510 Encounter for antineoplastic radiation therapy: Secondary | ICD-10-CM | POA: Diagnosis not present

## 2018-03-30 ENCOUNTER — Ambulatory Visit
Admission: RE | Admit: 2018-03-30 | Discharge: 2018-03-30 | Disposition: A | Discharge status: Home / Self Care | Payer: Medicare Other | Source: Ambulatory Visit | Attending: Radiation Oncology | Admitting: Radiation Oncology

## 2018-03-30 DIAGNOSIS — Z510 Encounter for antineoplastic radiation therapy: Secondary | ICD-10-CM | POA: Diagnosis not present

## 2018-03-31 ENCOUNTER — Ambulatory Visit
Admission: RE | Admit: 2018-03-31 | Discharge: 2018-03-31 | Disposition: A | Discharge status: Home / Self Care | Payer: Medicare Other | Source: Ambulatory Visit | Attending: Radiation Oncology | Admitting: Radiation Oncology

## 2018-03-31 DIAGNOSIS — Z510 Encounter for antineoplastic radiation therapy: Secondary | ICD-10-CM | POA: Diagnosis not present

## 2018-04-01 ENCOUNTER — Ambulatory Visit
Admission: RE | Admit: 2018-04-01 | Discharge: 2018-04-01 | Disposition: A | Discharge status: Home / Self Care | Payer: Medicare Other | Source: Ambulatory Visit | Attending: Radiation Oncology | Admitting: Radiation Oncology

## 2018-04-01 DIAGNOSIS — Z510 Encounter for antineoplastic radiation therapy: Secondary | ICD-10-CM | POA: Diagnosis not present

## 2018-04-02 ENCOUNTER — Ambulatory Visit
Admission: RE | Admit: 2018-04-02 | Discharge: 2018-04-02 | Disposition: A | Discharge status: Home / Self Care | Payer: Medicare Other | Source: Ambulatory Visit | Attending: Radiation Oncology | Admitting: Radiation Oncology

## 2018-04-02 DIAGNOSIS — Z510 Encounter for antineoplastic radiation therapy: Secondary | ICD-10-CM | POA: Diagnosis not present

## 2018-04-05 ENCOUNTER — Ambulatory Visit
Admission: RE | Admit: 2018-04-05 | Discharge: 2018-04-05 | Disposition: A | Discharge status: Home / Self Care | Payer: Medicare Other | Source: Ambulatory Visit | Attending: Radiation Oncology | Admitting: Radiation Oncology

## 2018-04-05 DIAGNOSIS — Z510 Encounter for antineoplastic radiation therapy: Secondary | ICD-10-CM | POA: Diagnosis not present

## 2018-04-06 ENCOUNTER — Ambulatory Visit
Admission: RE | Admit: 2018-04-06 | Discharge: 2018-04-06 | Disposition: A | Discharge status: Home / Self Care | Payer: Medicare Other | Source: Ambulatory Visit | Attending: Radiation Oncology | Admitting: Radiation Oncology

## 2018-04-06 DIAGNOSIS — Z510 Encounter for antineoplastic radiation therapy: Secondary | ICD-10-CM | POA: Diagnosis not present

## 2018-04-07 ENCOUNTER — Ambulatory Visit
Admission: RE | Admit: 2018-04-07 | Discharge: 2018-04-07 | Disposition: A | Discharge status: Home / Self Care | Payer: Medicare Other | Source: Ambulatory Visit | Attending: Radiation Oncology | Admitting: Radiation Oncology

## 2018-04-07 DIAGNOSIS — Z510 Encounter for antineoplastic radiation therapy: Secondary | ICD-10-CM | POA: Diagnosis not present

## 2018-04-08 ENCOUNTER — Ambulatory Visit
Admission: RE | Admit: 2018-04-08 | Discharge: 2018-04-08 | Disposition: A | Discharge status: Home / Self Care | Payer: Medicare Other | Source: Ambulatory Visit | Attending: Radiation Oncology | Admitting: Radiation Oncology

## 2018-04-08 ENCOUNTER — Inpatient Hospital Stay: Payer: Medicare Other

## 2018-04-08 DIAGNOSIS — Z171 Estrogen receptor negative status [ER-]: Principal | ICD-10-CM

## 2018-04-08 DIAGNOSIS — Z510 Encounter for antineoplastic radiation therapy: Secondary | ICD-10-CM | POA: Diagnosis not present

## 2018-04-08 DIAGNOSIS — C50211 Malignant neoplasm of upper-inner quadrant of right female breast: Secondary | ICD-10-CM

## 2018-04-08 DIAGNOSIS — G620 Drug-induced polyneuropathy: Secondary | ICD-10-CM | POA: Diagnosis not present

## 2018-04-08 LAB — COMPREHENSIVE METABOLIC PANEL
ALT: 23 U/L (ref 0–44)
AST: 20 U/L (ref 15–41)
Albumin: 4.2 g/dL (ref 3.5–5.0)
Alkaline Phosphatase: 60 U/L (ref 38–126)
Anion gap: 9 (ref 5–15)
BUN: 13 mg/dL (ref 8–23)
CO2: 29 mmol/L (ref 22–32)
Calcium: 9.4 mg/dL (ref 8.9–10.3)
Chloride: 101 mmol/L (ref 98–111)
Creatinine, Ser: 0.87 mg/dL (ref 0.44–1.00)
GFR calc Af Amer: >60 mL/min (ref >60)
GFR calc non Af Amer: >60 mL/min (ref >60)
Glucose, Bld: 118 mg/dL — ABNORMAL HIGH (ref 70–99)
Potassium: 4 mmol/L (ref 3.5–5.1)
Sodium: 139 mmol/L (ref 135–145)
Total Bilirubin: 0.7 mg/dL (ref 0.3–1.2)
Total Protein: 6.7 g/dL (ref 6.5–8.1)

## 2018-04-08 LAB — CBC WITH DIFFERENTIAL/PLATELET
Abs Immature Granulocytes: 0.01 10*3/uL (ref 0.00–0.07)
Basophils Absolute: 0 10*3/uL (ref 0.0–0.1)
Basophils Relative: 1 %
Eosinophils Absolute: 0.2 10*3/uL (ref 0.0–0.5)
Eosinophils Relative: 4 %
HCT: 37.3 % (ref 36.0–46.0)
Hemoglobin: 12.4 g/dL (ref 12.0–15.0)
Immature Granulocytes: 0 %
Lymphocytes Relative: 18 %
Lymphs Abs: 0.7 10*3/uL (ref 0.7–4.0)
MCH: 30.5 pg (ref 26.0–34.0)
MCHC: 33.2 g/dL (ref 30.0–36.0)
MCV: 91.6 fL (ref 80.0–100.0)
Monocytes Absolute: 0.5 10*3/uL (ref 0.1–1.0)
Monocytes Relative: 12 %
Neutro Abs: 2.5 10*3/uL (ref 1.7–7.7)
Neutrophils Relative %: 65 %
Platelets: 145 10*3/uL — ABNORMAL LOW (ref 150–400)
RBC: 4.07 MIL/uL (ref 3.87–5.11)
RDW: 13.2 % (ref 11.5–15.5)
WBC: 3.9 10*3/uL — ABNORMAL LOW (ref 4.0–10.5)
nRBC: 0 % (ref 0.0–0.2)

## 2018-04-09 ENCOUNTER — Ambulatory Visit
Admission: RE | Admit: 2018-04-09 | Discharge: 2018-04-09 | Disposition: A | Discharge status: Home / Self Care | Payer: Medicare Other | Source: Ambulatory Visit | Attending: Radiation Oncology | Admitting: Radiation Oncology

## 2018-04-09 DIAGNOSIS — Z510 Encounter for antineoplastic radiation therapy: Secondary | ICD-10-CM | POA: Diagnosis not present

## 2018-04-11 ENCOUNTER — Ambulatory Visit: Payer: Medicare Other

## 2018-04-12 ENCOUNTER — Ambulatory Visit
Admission: RE | Admit: 2018-04-12 | Discharge: 2018-04-12 | Disposition: A | Discharge status: Home / Self Care | Payer: Medicare Other | Source: Ambulatory Visit | Attending: Radiation Oncology | Admitting: Radiation Oncology

## 2018-04-12 DIAGNOSIS — Z510 Encounter for antineoplastic radiation therapy: Secondary | ICD-10-CM | POA: Diagnosis not present

## 2018-04-13 ENCOUNTER — Ambulatory Visit
Admission: RE | Admit: 2018-04-13 | Discharge: 2018-04-13 | Disposition: A | Discharge status: Home / Self Care | Payer: Medicare Other | Source: Ambulatory Visit | Attending: Radiation Oncology | Admitting: Radiation Oncology

## 2018-04-13 DIAGNOSIS — Z510 Encounter for antineoplastic radiation therapy: Secondary | ICD-10-CM | POA: Diagnosis not present

## 2018-04-15 ENCOUNTER — Ambulatory Visit
Admission: RE | Admit: 2018-04-15 | Discharge: 2018-04-15 | Disposition: A | Discharge status: Home / Self Care | Payer: Medicare Other | Source: Ambulatory Visit | Attending: Radiation Oncology | Admitting: Radiation Oncology

## 2018-04-15 DIAGNOSIS — Z510 Encounter for antineoplastic radiation therapy: Secondary | ICD-10-CM | POA: Diagnosis not present

## 2018-04-16 ENCOUNTER — Ambulatory Visit
Admission: RE | Admit: 2018-04-16 | Discharge: 2018-04-16 | Disposition: A | Discharge status: Home / Self Care | Payer: Medicare Other | Source: Ambulatory Visit | Attending: Radiation Oncology | Admitting: Radiation Oncology

## 2018-04-16 DIAGNOSIS — Z510 Encounter for antineoplastic radiation therapy: Secondary | ICD-10-CM | POA: Diagnosis not present

## 2018-04-17 ENCOUNTER — Ambulatory Visit: Payer: Medicare Other

## 2018-04-19 ENCOUNTER — Ambulatory Visit
Admission: RE | Admit: 2018-04-19 | Discharge: 2018-04-19 | Disposition: A | Discharge status: Home / Self Care | Payer: Medicare Other | Source: Ambulatory Visit | Attending: Radiation Oncology | Admitting: Radiation Oncology

## 2018-04-19 DIAGNOSIS — Z510 Encounter for antineoplastic radiation therapy: Secondary | ICD-10-CM | POA: Diagnosis not present

## 2018-04-20 ENCOUNTER — Ambulatory Visit
Admission: RE | Admit: 2018-04-20 | Discharge: 2018-04-20 | Disposition: A | Discharge status: Home / Self Care | Payer: Medicare Other | Source: Ambulatory Visit | Attending: Radiation Oncology | Admitting: Radiation Oncology

## 2018-04-20 DIAGNOSIS — Z510 Encounter for antineoplastic radiation therapy: Secondary | ICD-10-CM | POA: Diagnosis not present

## 2018-04-22 ENCOUNTER — Inpatient Hospital Stay: Payer: Medicare Other | Attending: Radiation Oncology

## 2018-04-22 ENCOUNTER — Other Ambulatory Visit: Payer: Self-pay

## 2018-04-22 ENCOUNTER — Ambulatory Visit
Admission: RE | Admit: 2018-04-22 | Discharge: 2018-04-22 | Disposition: A | Discharge status: Home / Self Care | Payer: Medicare Other | Source: Ambulatory Visit | Attending: Radiation Oncology | Admitting: Radiation Oncology

## 2018-04-22 DIAGNOSIS — Z171 Estrogen receptor negative status [ER-]: Secondary | ICD-10-CM | POA: Insufficient documentation

## 2018-04-22 DIAGNOSIS — C50911 Malignant neoplasm of unspecified site of right female breast: Secondary | ICD-10-CM | POA: Diagnosis present

## 2018-04-22 DIAGNOSIS — Z510 Encounter for antineoplastic radiation therapy: Secondary | ICD-10-CM | POA: Diagnosis not present

## 2018-04-22 DIAGNOSIS — C50211 Malignant neoplasm of upper-inner quadrant of right female breast: Secondary | ICD-10-CM

## 2018-04-22 DIAGNOSIS — G620 Drug-induced polyneuropathy: Secondary | ICD-10-CM | POA: Insufficient documentation

## 2018-04-22 DIAGNOSIS — C50411 Malignant neoplasm of upper-outer quadrant of right female breast: Secondary | ICD-10-CM | POA: Diagnosis present

## 2018-04-22 LAB — CBC WITH DIFFERENTIAL/PLATELET
Abs Immature Granulocytes: 0.01 10*3/uL (ref 0.00–0.07)
Basophils Absolute: 0 10*3/uL (ref 0.0–0.1)
Basophils Relative: 1 %
Eosinophils Absolute: 0.1 10*3/uL (ref 0.0–0.5)
Eosinophils Relative: 4 %
HCT: 37.5 % (ref 36.0–46.0)
Hemoglobin: 12.4 g/dL (ref 12.0–15.0)
Immature Granulocytes: 0 %
Lymphocytes Relative: 18 %
Lymphs Abs: 0.5 10*3/uL — ABNORMAL LOW (ref 0.7–4.0)
MCH: 30.4 pg (ref 26.0–34.0)
MCHC: 33.1 g/dL (ref 30.0–36.0)
MCV: 91.9 fL (ref 80.0–100.0)
Monocytes Absolute: 0.5 10*3/uL (ref 0.1–1.0)
Monocytes Relative: 15 %
Neutro Abs: 1.9 10*3/uL (ref 1.7–7.7)
Neutrophils Relative %: 62 %
Platelets: 139 10*3/uL — ABNORMAL LOW (ref 150–400)
RBC: 4.08 MIL/uL (ref 3.87–5.11)
RDW: 13.3 % (ref 11.5–15.5)
WBC: 3 10*3/uL — ABNORMAL LOW (ref 4.0–10.5)
nRBC: 0 % (ref 0.0–0.2)

## 2018-04-22 LAB — COMPREHENSIVE METABOLIC PANEL
ALT: 19 U/L (ref 0–44)
AST: 20 U/L (ref 15–41)
Albumin: 4.2 g/dL (ref 3.5–5.0)
Alkaline Phosphatase: 60 U/L (ref 38–126)
Anion gap: 9 (ref 5–15)
BUN: 13 mg/dL (ref 8–23)
CO2: 29 mmol/L (ref 22–32)
Calcium: 9.5 mg/dL (ref 8.9–10.3)
Chloride: 103 mmol/L (ref 98–111)
Creatinine, Ser: 0.75 mg/dL (ref 0.44–1.00)
GFR calc Af Amer: >60 mL/min (ref >60)
GFR calc non Af Amer: >60 mL/min (ref >60)
Glucose, Bld: 154 mg/dL — ABNORMAL HIGH (ref 70–99)
Potassium: 4 mmol/L (ref 3.5–5.1)
Sodium: 141 mmol/L (ref 135–145)
Total Bilirubin: 0.7 mg/dL (ref 0.3–1.2)
Total Protein: 7 g/dL (ref 6.5–8.1)

## 2018-04-23 ENCOUNTER — Ambulatory Visit
Admission: RE | Admit: 2018-04-23 | Discharge: 2018-04-23 | Disposition: A | Discharge status: Home / Self Care | Payer: Medicare Other | Source: Ambulatory Visit | Attending: Radiation Oncology | Admitting: Radiation Oncology

## 2018-04-23 ENCOUNTER — Ambulatory Visit: Payer: Medicare Other

## 2018-04-23 DIAGNOSIS — Z510 Encounter for antineoplastic radiation therapy: Secondary | ICD-10-CM | POA: Diagnosis not present

## 2018-04-26 ENCOUNTER — Ambulatory Visit: Payer: Medicare Other

## 2018-04-26 ENCOUNTER — Ambulatory Visit
Admission: RE | Admit: 2018-04-26 | Discharge: 2018-04-26 | Disposition: A | Discharge status: Home / Self Care | Payer: Medicare Other | Source: Ambulatory Visit | Attending: Radiation Oncology | Admitting: Radiation Oncology

## 2018-04-26 DIAGNOSIS — Z510 Encounter for antineoplastic radiation therapy: Secondary | ICD-10-CM | POA: Diagnosis not present

## 2018-04-27 ENCOUNTER — Ambulatory Visit
Admission: RE | Admit: 2018-04-27 | Discharge: 2018-04-27 | Disposition: A | Discharge status: Home / Self Care | Payer: Medicare Other | Source: Ambulatory Visit | Attending: Radiation Oncology | Admitting: Radiation Oncology

## 2018-04-27 ENCOUNTER — Other Ambulatory Visit: Payer: Self-pay | Admitting: *Deleted

## 2018-04-27 ENCOUNTER — Ambulatory Visit: Payer: Medicare Other

## 2018-04-27 DIAGNOSIS — Z510 Encounter for antineoplastic radiation therapy: Secondary | ICD-10-CM | POA: Diagnosis not present

## 2018-04-27 MED ORDER — CIPROFLOXACIN HCL 500 MG PO TABS: 500.0000 mg | ORAL_TABLET | Freq: Every day | ORAL | 0 refills | Status: DC

## 2018-04-28 ENCOUNTER — Ambulatory Visit
Admission: RE | Admit: 2018-04-28 | Discharge: 2018-04-28 | Disposition: A | Discharge status: Home / Self Care | Payer: Medicare Other | Source: Ambulatory Visit | Attending: Radiation Oncology | Admitting: Radiation Oncology

## 2018-04-28 DIAGNOSIS — Z510 Encounter for antineoplastic radiation therapy: Secondary | ICD-10-CM | POA: Diagnosis not present

## 2018-04-29 ENCOUNTER — Ambulatory Visit
Admission: RE | Admit: 2018-04-29 | Discharge: 2018-04-29 | Disposition: A | Discharge status: Home / Self Care | Payer: Medicare Other | Source: Ambulatory Visit | Attending: Radiation Oncology | Admitting: Radiation Oncology

## 2018-04-29 DIAGNOSIS — Z510 Encounter for antineoplastic radiation therapy: Secondary | ICD-10-CM | POA: Diagnosis not present

## 2018-04-30 ENCOUNTER — Ambulatory Visit
Admission: RE | Admit: 2018-04-30 | Discharge: 2018-04-30 | Disposition: A | Discharge status: Home / Self Care | Payer: Medicare Other | Source: Ambulatory Visit | Attending: Radiation Oncology | Admitting: Radiation Oncology

## 2018-04-30 DIAGNOSIS — Z510 Encounter for antineoplastic radiation therapy: Secondary | ICD-10-CM | POA: Diagnosis not present

## 2018-05-03 ENCOUNTER — Ambulatory Visit
Admission: RE | Admit: 2018-05-03 | Discharge: 2018-05-03 | Disposition: A | Discharge status: Home / Self Care | Payer: Medicare Other | Source: Ambulatory Visit | Attending: Radiation Oncology | Admitting: Radiation Oncology

## 2018-05-03 DIAGNOSIS — Z510 Encounter for antineoplastic radiation therapy: Secondary | ICD-10-CM | POA: Diagnosis not present

## 2018-05-04 ENCOUNTER — Ambulatory Visit
Admission: RE | Admit: 2018-05-04 | Discharge: 2018-05-04 | Disposition: A | Discharge status: Home / Self Care | Payer: Medicare Other | Source: Ambulatory Visit | Attending: Radiation Oncology | Admitting: Radiation Oncology

## 2018-05-04 ENCOUNTER — Ambulatory Visit: Payer: Medicare Other

## 2018-05-04 DIAGNOSIS — Z510 Encounter for antineoplastic radiation therapy: Secondary | ICD-10-CM | POA: Diagnosis not present

## 2018-05-05 ENCOUNTER — Ambulatory Visit
Admission: RE | Admit: 2018-05-05 | Discharge: 2018-05-05 | Disposition: A | Discharge status: Home / Self Care | Payer: Medicare Other | Source: Ambulatory Visit | Attending: Radiation Oncology | Admitting: Radiation Oncology

## 2018-05-05 DIAGNOSIS — Z510 Encounter for antineoplastic radiation therapy: Secondary | ICD-10-CM | POA: Diagnosis not present

## 2018-05-06 ENCOUNTER — Inpatient Hospital Stay: Payer: Medicare Other

## 2018-05-06 ENCOUNTER — Ambulatory Visit
Admission: RE | Admit: 2018-05-06 | Discharge: 2018-05-06 | Disposition: A | Discharge status: Home / Self Care | Payer: Medicare Other | Source: Ambulatory Visit | Attending: Radiation Oncology | Admitting: Radiation Oncology

## 2018-05-06 DIAGNOSIS — Z510 Encounter for antineoplastic radiation therapy: Secondary | ICD-10-CM | POA: Diagnosis not present

## 2018-05-06 DIAGNOSIS — Z95828 Presence of other vascular implants and grafts: Secondary | ICD-10-CM

## 2018-05-06 DIAGNOSIS — C50911 Malignant neoplasm of unspecified site of right female breast: Secondary | ICD-10-CM | POA: Diagnosis not present

## 2018-05-06 MED ORDER — HEPARIN SOD (PORK) LOCK FLUSH 100 UNIT/ML IV SOLN
500.0000 [IU] | Freq: Once | INTRAVENOUS | Status: AC
  Administered 2018-05-06: 500 [IU] via INTRAVENOUS
  Filled 2018-05-06: qty 5

## 2018-05-06 MED ORDER — SODIUM CHLORIDE 0.9% FLUSH
10.0000 mL | Freq: Once | INTRAVENOUS | Status: AC
  Administered 2018-05-06: 10 mL via INTRAVENOUS
  Filled 2018-05-06: qty 10

## 2018-05-07 ENCOUNTER — Ambulatory Visit
Admission: RE | Admit: 2018-05-07 | Discharge: 2018-05-07 | Disposition: A | Discharge status: Home / Self Care | Payer: Medicare Other | Source: Ambulatory Visit | Attending: Radiation Oncology | Admitting: Radiation Oncology

## 2018-05-07 DIAGNOSIS — Z510 Encounter for antineoplastic radiation therapy: Secondary | ICD-10-CM | POA: Diagnosis not present

## 2018-05-20 ENCOUNTER — Encounter: Payer: Self-pay | Admitting: Urgent Care

## 2018-05-21 ENCOUNTER — Inpatient Hospital Stay (HOSPITAL_BASED_OUTPATIENT_CLINIC_OR_DEPARTMENT_OTHER): Payer: Medicare Other | Admitting: Hematology and Oncology

## 2018-05-21 ENCOUNTER — Encounter: Payer: Self-pay | Admitting: Hematology and Oncology

## 2018-05-21 ENCOUNTER — Other Ambulatory Visit: Payer: Self-pay | Admitting: Hematology and Oncology

## 2018-05-21 VITALS — BP 141/87 | HR 76 | Temp 97.9°F | Resp 18 | Ht 68.0 in | Wt 144.7 lb

## 2018-05-21 DIAGNOSIS — Z171 Estrogen receptor negative status [ER-]: Secondary | ICD-10-CM

## 2018-05-21 DIAGNOSIS — C773 Secondary and unspecified malignant neoplasm of axilla and upper limb lymph nodes: Secondary | ICD-10-CM | POA: Diagnosis not present

## 2018-05-21 DIAGNOSIS — G620 Drug-induced polyneuropathy: Secondary | ICD-10-CM

## 2018-05-21 DIAGNOSIS — T451X5A Adverse effect of antineoplastic and immunosuppressive drugs, initial encounter: Secondary | ICD-10-CM

## 2018-05-21 DIAGNOSIS — C50911 Malignant neoplasm of unspecified site of right female breast: Secondary | ICD-10-CM | POA: Diagnosis not present

## 2018-05-21 DIAGNOSIS — Z87891 Personal history of nicotine dependence: Secondary | ICD-10-CM

## 2018-05-21 DIAGNOSIS — Z923 Personal history of irradiation: Secondary | ICD-10-CM

## 2018-05-21 DIAGNOSIS — C50411 Malignant neoplasm of upper-outer quadrant of right female breast: Secondary | ICD-10-CM | POA: Diagnosis not present

## 2018-05-21 DIAGNOSIS — Z7189 Other specified counseling: Secondary | ICD-10-CM

## 2018-05-21 LAB — CBC WITH DIFFERENTIAL/PLATELET
Abs Immature Granulocytes: 0.01 10*3/uL (ref 0.00–0.07)
Basophils Absolute: 0 10*3/uL (ref 0.0–0.1)
Basophils Relative: 1 %
Eosinophils Absolute: 0.1 10*3/uL (ref 0.0–0.5)
Eosinophils Relative: 3 %
HCT: 40.3 % (ref 36.0–46.0)
Hemoglobin: 13.2 g/dL (ref 12.0–15.0)
Immature Granulocytes: 0 %
Lymphocytes Relative: 12 %
Lymphs Abs: 0.5 10*3/uL — ABNORMAL LOW (ref 0.7–4.0)
MCH: 30 pg (ref 26.0–34.0)
MCHC: 32.8 g/dL (ref 30.0–36.0)
MCV: 91.6 fL (ref 80.0–100.0)
Monocytes Absolute: 0.5 10*3/uL (ref 0.1–1.0)
Monocytes Relative: 13 %
Neutro Abs: 2.9 10*3/uL (ref 1.7–7.7)
Neutrophils Relative %: 71 %
Platelets: 162 10*3/uL (ref 150–400)
RBC: 4.4 MIL/uL (ref 3.87–5.11)
RDW: 13.4 % (ref 11.5–15.5)
WBC: 4 10*3/uL (ref 4.0–10.5)
nRBC: 0 % (ref 0.0–0.2)

## 2018-05-21 LAB — COMPREHENSIVE METABOLIC PANEL
ALT: 19 U/L (ref 0–44)
AST: 19 U/L (ref 15–41)
Albumin: 4.4 g/dL (ref 3.5–5.0)
Alkaline Phosphatase: 64 U/L (ref 38–126)
Anion gap: 10 (ref 5–15)
BUN: 15 mg/dL (ref 8–23)
CO2: 27 mmol/L (ref 22–32)
Calcium: 9.4 mg/dL (ref 8.9–10.3)
Chloride: 99 mmol/L (ref 98–111)
Creatinine, Ser: 0.74 mg/dL (ref 0.44–1.00)
GFR calc Af Amer: >60 mL/min (ref >60)
GFR calc non Af Amer: >60 mL/min (ref >60)
Glucose, Bld: 150 mg/dL — ABNORMAL HIGH (ref 70–99)
Potassium: 4.2 mmol/L (ref 3.5–5.1)
Sodium: 136 mmol/L (ref 135–145)
Total Bilirubin: 1.1 mg/dL (ref 0.3–1.2)
Total Protein: 7.3 g/dL (ref 6.5–8.1)

## 2018-05-21 NOTE — Progress Notes (Signed)
[facility] day:  05/21/2018   Chief Complaint: Stephanie Sanders is a 73 y.o. female with multi-focal right breast cancer who is seen for assessment following completion of radiation.  HPI:  The patient was last seen in the medical oncology clinic on 02/19/2018.  At that time, she was recovering well from her partial mastectomy.  Energy remained low.  Diarrhea had resolved, however patient was constipated.  She denies any B symptoms or recent infections.  Exam revealed postsurgical changes to the right breast.  We discussed plans for radiation and consideration of Xeloda for adjuvant treatment based on her pathology.  She received breast radiation from 03/10/2018 - 05/07/2018.  During the interim, patient has been fatigued and more dizzy. She states, "today is the worst day that I have had in awhile". Patient is napping more. Despite efforts to remain active, she "just can't seem to get back on her feet". Diarrhea has resolved, but stools remain loose. No nausea, vomiting, or abdominal pain.  Patient is on daily probiotic therapy.   Patient notes that her memory has been increasingly "foggy" as of late. Discussed REMEMBER study inclusion, and patient is interested.   Patient denies any B symptoms. She denies any interval infections. Patient describes being lightheaded in clinic today. Blood pressure is 141/87.  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 She has developed a post-operative seroma, which is being periodically aspirated by surgery Windell Moment, MD). RIGHT breast tender, discolored, and draining (aspiration sites) in clinic today. Patient performs monthly self breast examinations as recommended.   Patient advises that she maintains an adequate appetite. She is eating well. Weight today is 144 lb 11.2 oz (65.6 kg), which compared to her last visit to the clinic, represents a 4 pound increase.   Patient denies pain in the clinic  today.   Past Medical History:  Diagnosis Date  . Cancer ([place]) 04/2017   right breast  . Diabetes mellitus without complication ([place])   . Diabetic nephropathy ([place])   . Headache    migraines  . Heart murmur    ASYMPTOMATIC  . Hypertension   . Hypothyroidism   . Personal history of chemotherapy 2019   right breast cancer  . Pneumonia   . Seborrheic keratosis   . Vitamin D deficiency     Past Surgical History:  Procedure Laterality Date  . APPENDECTOMY    . AXILLARY LYMPH NODE DISSECTION Right 02/01/2018   Procedure: AXILLARY LYMPH NODE DISSECTION;  Surgeon: Herbert Pun, MD;  Location: [facility] ORS;  Service: General;  Laterality: Right;  . BREAST BIOPSY Right 05/28/2017   right breast bx 3 areas invasive mamm ca lymph node mets  . BREAST CYST ASPIRATION Bilateral    neg  . BREAST LUMPECTOMY Right 02/01/2018  . CHOLECYSTECTOMY    . COLONOSCOPY WITH PROPOFOL N/A 01/01/2015   Procedure: COLONOSCOPY WITH PROPOFOL;  Surgeon: Josefine Class, MD;  Location: [facility] ENDOSCOPY;  Service: Endoscopy;  Laterality: N/A;  . EYE SURGERY Left    eyelid  . PARTIAL MASTECTOMY WITH NEEDLE LOCALIZATION Right 02/01/2018   Procedure: PARTIAL MASTECTOMY WITH NEEDLE LOCALIZATION;  Surgeon: Herbert Pun, MD;  Location: [facility] ORS;  Service: General;  Laterality: Right;  . PORTACATH PLACEMENT Right 06/10/2017   Procedure: INSERTION PORT-A-CATH;  Surgeon: Herbert Pun, MD;  Location: [facility] ORS;  Service: General;  Laterality: Right;  . SENTINEL NODE BIOPSY Right 02/01/2018   Procedure: SENTINEL NODE BIOPSY;  Surgeon: Herbert Pun, MD;  Location: [facility] ORS;  Service: General;  Laterality: Right;  . STENT PLACEMENT ILIAC ([place] HX)     temporary placement s/p cholecystectomy  . TUBAL LIGATION      Family History  Problem Relation Age of Onset  . Parkinson's disease Mother   . Cancer Maternal Aunt   . Cancer Father   . Lung cancer Father   . Breast cancer Neg Hx      Social History:  reports that she quit smoking about 46 years ago. Her smoking use included cigarettes. She has a 20.00 pack-year smoking history. She has never used smokeless tobacco. She reports that she does not drink alcohol or use drugs.  She has 3 children (daughters: age 8 and 1; son Jaquelyn Bitter): age 72).  She is retired.  She worked for the [employer].  Her husband has dementia.  She lives in [place].  She is alone today.  Allergies:  Allergies  Allergen Reactions  . Fiorinal [Butalbital-Aspirin-Caffeine] Nausea And Vomiting and Other (See Comments)    SEVERE HEADACHE  . Sulfa Antibiotics Other (See Comments)    Burning esophagus and stomach  . Tramadol Nausea And Vomiting  . Other Rash    STERI-STRIPS-RASH    Current Medications: Current Outpatient Medications  Medication Sig Dispense Refill  . atorvastatin (LIPITOR) 10 MG tablet Take 10 mg by mouth every evening.     . Cholecalciferol 2000 UNITS CAPS Take 4,000 Units by mouth daily.     . colesevelam (WELCHOL) 625 MG tablet Take 1,875 mg by mouth every evening.     Marland Kitchen levothyroxine (SYNTHROID, LEVOTHROID) 50 MCG tablet Take 50 mcg by mouth daily before breakfast.    . MAGNESIUM GLYCINATE PLUS PO Take 1 tablet by mouth at bedtime.     . metFORMIN (GLUCOPHAGE) 500 MG tablet Take 500 mg by mouth 2 (two) times daily.     . quinapril (ACCUPRIL) 5 MG tablet Take 5 mg by mouth every morning.     . sodium bicarbonate 650 MG tablet Take 1,300 mg by mouth at bedtime.    Marland Kitchen acetaminophen (TYLENOL) 500 MG tablet Take 1,000 mg by mouth every 6 (six) hours as needed for moderate pain or headache.     . ciprofloxacin (CIPRO) 500 MG tablet Take 1 tablet (500 mg total) by mouth daily with breakfast. (Patient not taking: Reported on 05/21/2018) 7 tablet 0  . lidocaine-prilocaine (EMLA) cream Apply 1 application topically as needed. (Patient not taking: Reported on 05/21/2018) 30 g 6  . LORazepam (ATIVAN) 0.5 MG tablet Take 1 tablet (0.5 mg total)  by mouth every 6 (six) hours as needed (Nausea or vomiting). (Patient not taking: Reported on 05/21/2018) 30 tablet 0  . Melatonin 5 MG TABS Take 5 mg by mouth at bedtime.      No current facility-administered medications for this visit.    Facility-Administered Medications Ordered in Other Visits  Medication Dose Route Frequency Provider Last Rate Last Dose  . sodium chloride flush (NS) 0.9 % injection 10 mL  10 mL Intravenous PRN Lequita Asal, MD   10 mL at 10/21/17 0841    Review of Systems  Constitutional: Negative for chills, diaphoresis, fever, malaise/fatigue and weight loss (up 4 pounds).       Feels "fatigued".  Needs 2 hour nap in the afternoon.  HENT: Negative.  Negative for congestion, ear discharge, ear pain, nosebleeds and sore throat.   Eyes: Negative.  Negative for blurred vision, double vision, photophobia and pain.  Respiratory: Negative.  Negative for cough, hemoptysis, sputum production and shortness of breath.   Cardiovascular: Negative.  Negative for chest pain, palpitations, orthopnea, leg swelling and PND.  Gastrointestinal: Positive for diarrhea (loose stool this morning). Negative for abdominal pain, blood in stool, constipation, heartburn, melena, nausea and vomiting.  Genitourinary: Negative.  Negative for dysuria, frequency, hematuria and urgency.  Musculoskeletal: Negative.  Negative for back pain, falls, joint pain, myalgias and neck pain.  Skin: Negative for itching and rash.       Radiation changes to right breast.  Neurological: Positive for sensory change (neuropathy). Negative for dizziness, tremors, speech change, focal weakness, seizures, weakness and headaches.       Feels lightheaded.  Endo/Heme/Allergies: Does not bruise/bleed easily.       Diabetes - on Metformin.  Blood sugar "up and down".  Psychiatric/Behavioral: Positive for memory loss ("foggy" lately). Negative for depression. The patient is not nervous/anxious and does not have  insomnia.   All other systems reviewed and are negative.  Performance status (ECOG): 1  Vital signs BP (!) 141/87 (BP Location: Left Arm, Patient Position: Sitting)   Pulse 76   Temp 97.9 F (36.6 C) (Tympanic)   Resp 18   Ht '5\' 8"'$  (1.727 m)   Wt 144 lb 11.2 oz (65.6 kg)   SpO2 100%   BMI 22.00 kg/m   Physical Exam  Constitutional: She is oriented to person, place, and time and well-developed, well-nourished, and in no distress. No distress.  HENT:  Head: Normocephalic and atraumatic.  Mouth/Throat: Oropharynx is clear and moist and mucous membranes are normal. No oropharyngeal exudate.  Short gray hair.  Eyes: Pupils are equal, round, and reactive to light. Conjunctivae and EOM are normal. No scleral icterus.  Glasses.  Blue eyes.  Neck: Normal range of motion. Neck supple. No JVD present.  Cardiovascular: Normal rate, regular rhythm, normal heart sounds and intact distal pulses. Exam reveals no gallop and no friction rub.  No murmur heard. Pulmonary/Chest: Effort normal and breath sounds normal. No respiratory distress. She has no wheezes. She has no rales. Right breast exhibits skin change (post surgical changes and hyper and hypopigmentation radiation changes with seroma leaking on palpation) and tenderness (slight). Right breast exhibits no inverted nipple, no mass and no nipple discharge. Left breast exhibits no inverted nipple, no mass, no nipple discharge, no skin change and no tenderness.  Abdominal: Soft. Bowel sounds are normal. She exhibits no distension and no mass. There is no abdominal tenderness. There is no rebound and no guarding.  Musculoskeletal: Normal range of motion.        General: No tenderness or edema.  Lymphadenopathy:    She has no cervical adenopathy.    She has no axillary adenopathy.       Right: No supraclavicular adenopathy present.       Left: No supraclavicular adenopathy present.  Neurological: She is alert and oriented to person, place, and  time. Gait normal.  Skin: Skin is warm and dry. No rash noted. She is not diaphoretic. No erythema. No pallor.  Psychiatric: Mood, affect and judgment normal.  Nursing note and vitals reviewed.   No visits with results within 3 Day(s) from this visit.  Latest known visit with results is:  Orders Only on 04/22/2018  Component Date Value Ref Range Status  . Sodium 04/22/2018 141  135 - 145 mmol/L Final  . Potassium 04/22/2018 4.0  3.5 - 5.1 mmol/L Final  . Chloride 04/22/2018 103  98 - 111 mmol/L  Final  . CO2 04/22/2018 29  22 - 32 mmol/L Final  . Glucose, Bld 04/22/2018 154* 70 - 99 mg/dL Final  . BUN 04/22/2018 13  8 - 23 mg/dL Final  . Creatinine, Ser 04/22/2018 0.75  0.44 - 1.00 mg/dL Final  . Calcium 04/22/2018 9.5  8.9 - 10.3 mg/dL Final  . Total Protein 04/22/2018 7.0  6.5 - 8.1 g/dL Final  . Albumin 04/22/2018 4.2  3.5 - 5.0 g/dL Final  . AST 04/22/2018 20  15 - 41 U/L Final  . ALT 04/22/2018 19  0 - 44 U/L Final  . Alkaline Phosphatase 04/22/2018 60  38 - 126 U/L Final  . Total Bilirubin 04/22/2018 0.7  0.3 - 1.2 mg/dL Final  . GFR calc non Af Amer 04/22/2018 >60  >60 mL/min Final  . GFR calc Af Amer 04/22/2018 >60  >60 mL/min Final  . Anion gap 04/22/2018 9  5 - 15 Final   Performed at [facility], [address], [place], [place] [address]  . WBC 04/22/2018 3.0* 4.0 - 10.5 K/uL Final  . RBC 04/22/2018 4.08  3.87 - 5.11 MIL/uL Final  . Hemoglobin 04/22/2018 12.4  12.0 - 15.0 g/dL Final  . HCT 04/22/2018 37.5  36.0 - 46.0 % Final  . MCV 04/22/2018 91.9  80.0 - 100.0 fL Final  . MCH 04/22/2018 30.4  26.0 - 34.0 pg Final  . MCHC 04/22/2018 33.1  30.0 - 36.0 g/dL Final  . RDW 04/22/2018 13.3  11.5 - 15.5 % Final  . Platelets 04/22/2018 139* 150 - 400 K/uL Final  . nRBC 04/22/2018 0.0  0.0 - 0.2 % Final  . Neutrophils Relative % 04/22/2018 62  % Final  . Neutro Abs 04/22/2018 1.9  1.7 - 7.7 K/uL Final  . Lymphocytes Relative 04/22/2018 18  % Final  . Lymphs Abs  04/22/2018 0.5* 0.7 - 4.0 K/uL Final  . Monocytes Relative 04/22/2018 15  % Final  . Monocytes Absolute 04/22/2018 0.5  0.1 - 1.0 K/uL Final  . Eosinophils Relative 04/22/2018 4  % Final  . Eosinophils Absolute 04/22/2018 0.1  0.0 - 0.5 K/uL Final  . Basophils Relative 04/22/2018 1  % Final  . Basophils Absolute 04/22/2018 0.0  0.0 - 0.1 K/uL Final  . Immature Granulocytes 04/22/2018 0  % Final  . Abs Immature Granulocytes 04/22/2018 0.01  0.00 - 0.07 K/uL Final   Performed at [facility], [address] [place], [place] [address]    Assessment:  Stephanie Sanders is a 73 y.o. 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  Pathology revealed two grade II invasive mammary carcinomas of no special type (4 cm from the nipple and 5 cm from the nipple).  Tumors were in close proximity.  Lymph node biopsy confirmed metastatic carcinoma of breast origin.  Tumor is ER negative (< 1%), PR negative and Her 2/neu 2+ by IHC.  [facility] genetic testing on 07/21/2017 revealed a variant of uncertain significance in RECQL4.  Right sided mammogram and ultrasound on 05/21/2017 revealed 2 highly suspicious masses in the right breast at the 10:30 position 4 cm from nipple (1.8 x 1.2 x 1.1 cm) and at the 10:30 position 5 cm from nipple (1.1 x 0.9 x 0.9 cm).  There was a suspicious 2.7 x 1 x 2.2 cm lymph node in the right axilla with asymmetric nodular cortical thickening.  Ultrasound guided biopsy of the 2 masses and lymph  node on 05/28/2017 revealed grade II invasive mammary carcinoma of no special type (4 cm from the nipple and 5 cm from the nipple).  Lymph node biopsy revealed metastatic carcinoma of breast origin.  Tumor is ER negative (< 1%), PR negative and Her 2/neu 2+ by IHC.  FISH was negative.   CA 27.29 has been followed: 16.5 on 06/04/2017 and 14.3 on 05/21/2018.  CA15-3 was 17.6 on 06/04/2017.  Echo on 06/09/2017 revealed an EF of 60-65%.   She has enrolled on the UPBEAT clinical trial.  She received 4 cycles of AC (06/23/2017 - 09/08/2017) with Udencya support.  Cycle #1 was complicated by influenza A.  Nadir counts included an ANC of 0 and platelet count of 71,000. Cycle #2 postponed due to slow recovery from influenza and excisional cyst removal with surrounding cellulitis (treated with 10 days of doxycycline).   She received 12 weeks of  Taxol (09/29/2017 - 11/10/2017; 11/24/2017 - 12/08/2017; 12/22/2017 - 01/05/2018).  She received GCSF after week #3 and 5 Taxol secondary to borderline counts. Cycle #5 was postponed on 10/29/2017 due to patient's wishes to be treated on Tuesdays only. Week #7 Taxol was held secondary to neutropenia (ANC 200).  She received breast radiation from 03/10/2018 - 05/07/2018.  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  Normal labs on 11/24/2017 included:  B12, folate, TSH, and copper.  Right mammogram and ultrasound on 09/28/2017 revealed the 2 adjacent masses in the right breast at the 10:30 o'clock position had both decreased in size (1.8 x 1.1 x 1.2 cm to 1.2 x 0.7 x 1.1 cm; 11 x 9 x 9 mm to 7 x 6 x 6 mm) as well as the metastatic right axillary lymph node (2.7 cm to 1.6 x 0.9 x 1.7 cm).    Right partial mastectomy and sentinel lymph node biopsy on 02/01/2018 Pathology revealed 10 mm grade II invasive carcinoma of no special type.  The tumor bed encompassed the biopsy sites, with foci of residual  carcinoma at both biopsy sites and also involving the tissue between the  biopsy sites. Foci of residual carcinoma range from 1 to 10 mm.There was no DCIS.  Distance from closest margin was 1 mm.  Four of 4 lymph nodes were negative.  Pathologic stage was ypT1b (m) ypN0 (sn).  She was admitted to [facility] from 06/28/2017 - 07/01/2017 with with fever, cough and generalized weakness.  Nasal swab was + for influenza A.  She completed a course of Tamiflu.  Symptomatically, she feels fatigued.  She has  intermittent loose stools.  Exam reveals a leaking right breast seroma.  WBC is 4000 with an [place] of 2900.  Plan: 1. Labs today:  CBC with diff, CMP, CA27.29. 2. Multi-focal triple negative right breast cancer Patient is s/p neoadjuvant chemotherapy followed by surgery. Residual disease present at time of surgery. Radiation is complete. Discuss plan for adjuvant Xeloda x 8 cycles based on CREATE-X trial: 900 patients with HER2- breast cancer (1/3 with triple-negative disease) and residual disease after neoadjuvant chemotherapy  Patients received 8 cycles of adjuvant Xeloda (2 weeks on/1 week off) vs no further therapy.  Overall 5 year survival was 74% vs 68% and OS was 89% vs 84%. Subgroup analysis revealed benefit in triple negative patients (DFS 70% vs 56% in the control group and OS 78.8% vs 70.3%.  Side effects:  diarrhea, neutropenia, and hand-foot syndrome. Patient wishes to postpone consideration of Xeloda secondary to fatigue.  She is also concerned about her blood counts.  Discuss plan for reassessment in 1 month.  Patient in agreement. 3. Chemotherapy-induced neuropathy Neuropathy is stable. Continue to monitor. 4.   Port flush every 6 weeks (last done 05/06/2018). 5.   RTC in 4 weeks for MD assessment in [place] and labs (CBC with diff, CMP, Mg).   Nolon Stalls, MD 05/21/2018,11:24 AM

## 2018-05-21 NOTE — Progress Notes (Signed)
No new changes noted today 

## 2018-05-22 LAB — CANCER ANTIGEN 27.29: CA 27.29: 14.3 U/mL (ref 0.0–38.6)

## 2018-06-09 ENCOUNTER — Encounter: Payer: Self-pay | Admitting: *Deleted

## 2018-06-09 ENCOUNTER — Ambulatory Visit
Admission: RE | Admit: 2018-06-09 | Discharge: 2018-06-09 | Disposition: A | Discharge status: Home / Self Care | Payer: Medicare Other | Source: Ambulatory Visit | Attending: Radiation Oncology | Admitting: Radiation Oncology

## 2018-06-09 ENCOUNTER — Inpatient Hospital Stay: Payer: Medicare Other

## 2018-06-09 ENCOUNTER — Other Ambulatory Visit: Payer: Self-pay

## 2018-06-09 ENCOUNTER — Encounter: Payer: Self-pay | Admitting: Radiation Oncology

## 2018-06-09 ENCOUNTER — Inpatient Hospital Stay: Payer: Medicare Other | Attending: Radiation Oncology

## 2018-06-09 VITALS — BP 159/89 | HR 87 | Temp 98.0°F | Resp 18 | Wt 149.0 lb

## 2018-06-09 DIAGNOSIS — Z006 Encounter for examination for normal comparison and control in clinical research program: Secondary | ICD-10-CM | POA: Diagnosis present

## 2018-06-09 DIAGNOSIS — Z171 Estrogen receptor negative status [ER-]: Secondary | ICD-10-CM | POA: Insufficient documentation

## 2018-06-09 DIAGNOSIS — Z923 Personal history of irradiation: Secondary | ICD-10-CM | POA: Insufficient documentation

## 2018-06-09 DIAGNOSIS — C50911 Malignant neoplasm of unspecified site of right female breast: Secondary | ICD-10-CM

## 2018-06-09 DIAGNOSIS — C50411 Malignant neoplasm of upper-outer quadrant of right female breast: Secondary | ICD-10-CM | POA: Insufficient documentation

## 2018-06-09 NOTE — Progress Notes (Unsigned)
Survivorship Care Plan visit completed.  Treatment summary reviewed and given to patient.  ASCO answers booklet reviewed and given to patient.  CARE program and Cancer Transitions discussed with patient along with other resources cancer center offers to patients and caregivers.  Patient verbalized understanding.    

## 2018-06-09 NOTE — Progress Notes (Signed)
Radiation Oncology Follow up Note  Name: Stephanie Sanders   Date:   06/09/2018 MRN:  [id number] DOB: [birth year]    This 73 y.o. female presents to the clinic today for one-month follow-up status post whole breast radiation to her right breast for stage IIB triple negative invasive mammary carcinoma.  REFERRING PROVIDER: Baxter Hire, MD  HPI: patient is a 73 year old female now out 1 month having completed whole breast radiation to her right breast status post neoadjuvant chemotherapy and wide local excision for stage IIB (T2 N1 M0) invasive mammary carcinoma. She is seen today in routine follow-up is doing well still having tenderness and pain in the right breast still having drainage from her lumpectomy site. Surgeon may plan to do a reexcision at that area in the near future. She otherwise specifically denies cough or bone pain. She is triple negative will not receive antiestrogen therapy..patient may be candidate for oral Xeloda in the future.  COMPLICATIONS OF TREATMENT: none  FOLLOW UP COMPLIANCE: keeps appointments   PHYSICAL EXAM:  BP (!) 159/89 (BP Location: Left Arm, Patient Position: Sitting)   Pulse 87   Temp 98 F (36.7 C) (Tympanic)   Resp 18   Wt 149 lb 0.5 oz (67.6 kg)   BMI 22.66 kg/m  Right breast and has an area of slight ulceration in the scar consistent with known to continue drainage. No other dominant mass or nodularity is noted in either breast in 2 positions examined. No axillary or supraclavicular adenopathy is appreciated.Well-developed well-nourished patient in NAD. HEENT reveals PERLA, EOMI, discs not visualized.  Oral cavity is clear. No oral mucosal lesions are identified. Neck is clear without evidence of cervical or supraclavicular adenopathy. Lungs are clear to A&P. Cardiac examination is essentially unremarkable with regular rate and rhythm without murmur rub or thrill. Abdomen is benign with no organomegaly or masses noted. Motor sensory and DTR levels  are equal and symmetric in the upper and lower extremities. Cranial nerves II through XII are grossly intact. Proprioception is intact. No peripheral adenopathy or edema is identified. No motor or sensory levels are noted. Crude visual fields are within normal range.  RADIOLOGY RESULTS: no current films for review  PLAN: resent time patient is doing well continues to be followed by surgery for persistent seroma causing pain and discomfort. I have asked to see her back in 4-5 months for follow-up. She continues close follow-up care with medical oncology.  I would like to take this opportunity to thank you for allowing me to participate in the care of your patient.Noreene Filbert, MD

## 2018-06-09 NOTE — Research (Signed)
Patient Rachal Dvorsky presents to clinic this afternoon for purpose of completing her 12 month study visit requirements on the [place] UPBEAT study. Patient was re-consented to [id number] Protocol version date 12/15/17, and was informed that there is another amendment coming, which will inform patients that there will no longer be an option to receive motivational text messages through the study. Patient verified that she does desire to continue study participation, and after consent changes were reviewed with her and her questions were answered, she provided written consent to continue on the UPBEAT study. Patient was given copies of the signed consent forms for her records. RN assessment was for the 12 month time-point while patient in clinic including the 6 minute walk and Physical Performance Battery, the 12 month Neurocognitive Battery and the 37-month Self-administered questionnaire. Prior to completion of the study procedures, patient's height and weight were measured as follows: weight is 148.1 lbs and height is 68 inches. These measurements were collected after removing shoes and standing straight up with eyes facing forward, as she was assessed at baseline, but were not collected using a stadiometer as preferred. Her BMI was electronically calculated at 22.5 today. After resting for 5 minutes, patient's blood pressure was measured at 131/74 and heart rate was 93 bpm. VS rechecked after waiting 5 more minutes and b/p was then measured at 109/68 and heart rate 82. These measurements were collected while patient was sitting straight up with feet on the floor and legs uncrossed. Waist circumference was measured in accordance with the new protocol amendment, at the top of the hip curve and at the level of the umbilicus with patient not holding her breath at 35.5 inches. Current medications were verified with patient. She continues to report a little fatigue, but states this has improved since she has completed  her chemotherapy and radiation treatments. She has reported some issues with her memory and being more forgetful, and has some mild sensory neuropathy in her left hand and left foot. Denies further issues with malaise and heartburn. Central study labs were collected and shipped today and patient was given a $25.00 [employer] gift card and an UPBEAT study tote as well as a pink ribbon sticker and appointment magnet as a token of thanks for participating in the study. Adverse events with grade and attribution updated this visit as follows:  Adverse Event Log  Study/Protocol: [id number] Cycle: 12 month Event Grade Onset Date Resolved Date Drug Name Attribution Treatment Comments  Malaise 1 06/24/2017 06/09/2018  Chemo: AC  Not as severe now  Fatigue 1 06/24/2017   Chemo and Radiation  Mild now but still ongoing  Heartburn 1 09/08/2017 06/09/2018  Chemo: AC    Memory problems 1 05/21/2018   Chemotherapy  Reports forgetfulness  Sensory neuropathy 1 10/21/2017   Taxol  Reports mild  Yolande Jolly, BSN, MHA, OCN 06/09/2018 3:44 PM

## 2018-06-17 ENCOUNTER — Inpatient Hospital Stay: Payer: Medicare Other

## 2018-06-18 ENCOUNTER — Encounter: Payer: Self-pay | Admitting: Hematology and Oncology

## 2018-06-18 ENCOUNTER — Telehealth: Payer: Self-pay | Admitting: Pharmacy Technician

## 2018-06-18 ENCOUNTER — Inpatient Hospital Stay (HOSPITAL_BASED_OUTPATIENT_CLINIC_OR_DEPARTMENT_OTHER): Payer: Medicare Other | Admitting: Hematology and Oncology

## 2018-06-18 ENCOUNTER — Inpatient Hospital Stay: Payer: Medicare Other

## 2018-06-18 VITALS — BP 132/62 | HR 76 | Temp 98.1°F | Resp 18 | Ht 68.0 in | Wt 147.9 lb

## 2018-06-18 DIAGNOSIS — Z7189 Other specified counseling: Secondary | ICD-10-CM

## 2018-06-18 DIAGNOSIS — Z171 Estrogen receptor negative status [ER-]: Secondary | ICD-10-CM | POA: Diagnosis not present

## 2018-06-18 DIAGNOSIS — C50911 Malignant neoplasm of unspecified site of right female breast: Secondary | ICD-10-CM

## 2018-06-18 DIAGNOSIS — N6489 Other specified disorders of breast: Secondary | ICD-10-CM | POA: Diagnosis not present

## 2018-06-18 DIAGNOSIS — C50411 Malignant neoplasm of upper-outer quadrant of right female breast: Secondary | ICD-10-CM

## 2018-06-18 DIAGNOSIS — Z780 Asymptomatic menopausal state: Secondary | ICD-10-CM

## 2018-06-18 DIAGNOSIS — Z87891 Personal history of nicotine dependence: Secondary | ICD-10-CM

## 2018-06-18 DIAGNOSIS — R110 Nausea: Secondary | ICD-10-CM

## 2018-06-18 DIAGNOSIS — C773 Secondary and unspecified malignant neoplasm of axilla and upper limb lymph nodes: Secondary | ICD-10-CM | POA: Diagnosis not present

## 2018-06-18 DIAGNOSIS — T451X5A Adverse effect of antineoplastic and immunosuppressive drugs, initial encounter: Secondary | ICD-10-CM

## 2018-06-18 DIAGNOSIS — G620 Drug-induced polyneuropathy: Secondary | ICD-10-CM

## 2018-06-18 DIAGNOSIS — Z923 Personal history of irradiation: Secondary | ICD-10-CM

## 2018-06-18 LAB — COMPREHENSIVE METABOLIC PANEL
ALT: 21 U/L (ref 0–44)
AST: 22 U/L (ref 15–41)
Albumin: 4.3 g/dL (ref 3.5–5.0)
Alkaline Phosphatase: 55 U/L (ref 38–126)
Anion gap: 10 (ref 5–15)
BUN: 19 mg/dL (ref 8–23)
CO2: 25 mmol/L (ref 22–32)
Calcium: 9.2 mg/dL (ref 8.9–10.3)
Chloride: 101 mmol/L (ref 98–111)
Creatinine, Ser: 0.72 mg/dL (ref 0.44–1.00)
GFR calc non Af Amer: >60 mL/min (ref >60)
Glucose, Bld: 129 mg/dL — ABNORMAL HIGH (ref 70–99)
Potassium: 3.5 mmol/L (ref 3.5–5.1)
Sodium: 136 mmol/L (ref 135–145)
Total Bilirubin: 1.1 mg/dL (ref 0.3–1.2)
Total Protein: 7 g/dL (ref 6.5–8.1)

## 2018-06-18 LAB — CBC WITH DIFFERENTIAL/PLATELET
Abs Immature Granulocytes: 0.02 10*3/uL (ref 0.00–0.07)
BASOS ABS: 0 10*3/uL (ref 0.0–0.1)
Basophils Relative: 0 %
Eosinophils Absolute: 0.3 10*3/uL (ref 0.0–0.5)
Eosinophils Relative: 7 %
HCT: 37.5 % (ref 36.0–46.0)
Hemoglobin: 13.1 g/dL (ref 12.0–15.0)
Immature Granulocytes: 1 %
LYMPHS ABS: 0.7 10*3/uL (ref 0.7–4.0)
Lymphocytes Relative: 18 %
MCH: 32.5 pg (ref 26.0–34.0)
MCHC: 34.9 g/dL (ref 30.0–36.0)
MCV: 93.1 fL (ref 80.0–100.0)
Monocytes Absolute: 0.5 10*3/uL (ref 0.1–1.0)
Monocytes Relative: 13 %
NRBC: 0 % (ref 0.0–0.2)
Neutro Abs: 2.4 10*3/uL (ref 1.7–7.7)
Neutrophils Relative %: 61 %
Platelets: 155 10*3/uL (ref 150–400)
RBC: 4.03 MIL/uL (ref 3.87–5.11)
RDW: 13.3 % (ref 11.5–15.5)
WBC: 3.9 10*3/uL — ABNORMAL LOW (ref 4.0–10.5)

## 2018-06-18 MED ORDER — HEPARIN SOD (PORK) LOCK FLUSH 100 UNIT/ML IV SOLN
500.0000 [IU] | Freq: Once | INTRAVENOUS | Status: AC
  Administered 2018-06-18: 500 [IU] via INTRAVENOUS

## 2018-06-18 MED ORDER — SODIUM CHLORIDE 0.9% FLUSH
10.0000 mL | INTRAVENOUS | Status: DC | PRN
  Administered 2018-06-18: 10 mL via INTRAVENOUS
  Filled 2018-06-18: qty 10

## 2018-06-18 MED ORDER — CAPECITABINE 500 MG PO TABS: 1500.0000 mg | ORAL_TABLET | Freq: Two times a day (BID) | ORAL | 0 refills | Status: DC

## 2018-06-18 MED ORDER — CAPECITABINE 150 MG PO TABS: 300.0000 mg | ORAL_TABLET | Freq: Two times a day (BID) | ORAL | 0 refills | Status: DC

## 2018-06-18 NOTE — Telephone Encounter (Signed)
Oral Oncology Patient Advocate Encounter  After completing a benefits investigation, prior authorization for Xeloda is not required at this time through [facility].  Patient's copay is $20.03 for 150 mg tablet and $64.14 for 500 mg tablet.    Copay assistance closed while getting income information.   Patient can fill 1 time with [facility] and then must use [facility].  [place] Patient [place] Phone [phone] Fax [phone] 06/18/2018 3:39 PM

## 2018-06-18 NOTE — Progress Notes (Signed)
START ON PATHWAY REGIMEN - Breast     A cycle is every 21 days:     Capecitabine   **Always confirm dose/schedule in your pharmacy ordering system**  Patient Characteristics: Post-Neoadjuvant Therapy and Resection, HER2 Negative/Unknown/Equivocal, ER Negative/Unknown, Residual Disease, Adjuvant Therapy - Residual Disease After Neoadjuvant Chemotherapy Therapeutic Status: Post-Neoadjuvant Therapy and Resection ER Status: Negative (-) HER2 Status: Negative (-) PR Status: Negative (-) Residual Invasive Disease Post-Neoadjuvant Therapy<= Yes Intent of Therapy: Curative Intent, Discussed with Patient

## 2018-06-18 NOTE — Progress Notes (Signed)
No new changes noted today 

## 2018-06-18 NOTE — Progress Notes (Signed)
DISCONTINUE ON PATHWAY REGIMEN - Breast     A cycle is every 14 days (cycles 1-4):     Doxorubicin      Cyclophosphamide      Pegfilgrastim-xxxx    A cycle is every 21 days (cycles 5-8):     Paclitaxel      Carboplatin   **Always confirm dose/schedule in your pharmacy ordering system**  REASON: Other Reason PRIOR TREATMENT: [id number]: Dose-Dense AC [Doxorubicin + Cyclophosphamide q14 Days x 4 Cycles], Followed by Paclitaxel 80 mg/m2 Weekly + Carboplatin AUC=6 q21 Days x 12 Weeks TREATMENT RESPONSE: Partial Response (PR)    Patient Characteristics: Post-Neoadjuvant Therapy and Resection Therapeutic Status: Post-Neoadjuvant Therapy and Resection ER Status: Negative (-) HER2 Status: Negative (-) PR Status: Negative (-)

## 2018-06-18 NOTE — Progress Notes (Signed)
[facility] day:  06/18/2018   Chief Complaint: Stephanie Sanders is a 73 y.o. 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  HPI:  The patient was last seen in the medical oncology clinic on 05/21/2018.  At that time, she had finished her radiation.  She noted ongoing fatigue.  She had variable bowels (sometimes loose).  She was taking probiotics.  Her right breast seroma was leaking.  We discussed initiation of Xeloda for 8 cycles.  Decision was made to postpone initiation of therapy x 1 month.  During the interim, her energy level has returned back to normal.  She stated that her seroma is still leaking.  She is requiring less frequent aspirations with Dr. Windell Moment (none last week).  Amount of fluid removed is "less and less".    She would like to postpone initiation of Xeloda until week of 07/05/2018.  Her daughter is going to [place] in 1 month.  She would like to enjoy their time together.  She feels that she is susceptible to diarrhea and it may become worse on Xeloda.  She has also had issues with low counts.   Past Medical History:  Diagnosis Date  . Cancer ([place]) 04/2017   right breast  . Diabetes mellitus without complication ([place])   . Diabetic nephropathy ([place])   . Headache    migraines  . Heart murmur    ASYMPTOMATIC  . Hypertension   . Hypothyroidism   . Personal history of chemotherapy 2019   right breast cancer  . Pneumonia   . Seborrheic keratosis   . Vitamin D deficiency     Past Surgical History:  Procedure Laterality Date  . APPENDECTOMY    . AXILLARY LYMPH NODE DISSECTION Right 02/01/2018   Procedure: AXILLARY LYMPH NODE DISSECTION;  Surgeon: Herbert Pun, MD;  Location: [facility] ORS;  Service: General;  Laterality: Right;  . BREAST BIOPSY Right 05/28/2017   right breast bx 3 areas invasive mamm ca lymph node mets  .  BREAST CYST ASPIRATION Bilateral    neg  . BREAST LUMPECTOMY Right 02/01/2018  . CHOLECYSTECTOMY    . COLONOSCOPY WITH PROPOFOL N/A 01/01/2015   Procedure: COLONOSCOPY WITH PROPOFOL;  Surgeon: Josefine Class, MD;  Location: [facility] ENDOSCOPY;  Service: Endoscopy;  Laterality: N/A;  . EYE SURGERY Left    eyelid  . PARTIAL MASTECTOMY WITH NEEDLE LOCALIZATION Right 02/01/2018   Procedure: PARTIAL MASTECTOMY WITH NEEDLE LOCALIZATION;  Surgeon: Herbert Pun, MD;  Location: [facility] ORS;  Service: General;  Laterality: Right;  . PORTACATH PLACEMENT Right 06/10/2017   Procedure: INSERTION PORT-A-CATH;  Surgeon: Herbert Pun, MD;  Location: [facility] ORS;  Service: General;  Laterality: Right;  . SENTINEL NODE BIOPSY Right 02/01/2018   Procedure: SENTINEL NODE BIOPSY;  Surgeon: Herbert Pun, MD;  Location: [facility] ORS;  Service: General;  Laterality: Right;  . STENT PLACEMENT ILIAC ([place] HX)     temporary placement s/p cholecystectomy  . TUBAL LIGATION      Family History  Problem Relation Age of Onset  . Parkinson's disease Mother   . Cancer Maternal Aunt   . Cancer Father   . Lung cancer Father   . Breast cancer Neg Hx     Social History:  reports that she quit smoking about 46 years ago. Her smoking use included cigarettes. She has a 20.00 pack-year smoking history. She has never used smokeless  tobacco. She reports that she does not drink alcohol or use drugs.  She has 3 children (daughters: age 40 and 50; son Jaquelyn Bitter): age 25).  She is retired.  She worked for the [employer].  Her husband has dementia.  She lives in [place].  She is alone today.  Allergies:  Allergies  Allergen Reactions  . Fiorinal [Butalbital-Aspirin-Caffeine] Nausea And Vomiting and Other (See Comments)    SEVERE HEADACHE  . Sulfa Antibiotics Other (See Comments)    Burning esophagus and stomach  . Tramadol Nausea And Vomiting  . Other Rash    STERI-STRIPS-RASH    Current Medications: Current  Outpatient Medications  Medication Sig Dispense Refill  . atorvastatin (LIPITOR) 10 MG tablet Take 10 mg by mouth every evening.     . Cholecalciferol 2000 UNITS CAPS Take 4,000 Units by mouth daily.     Marland Kitchen levothyroxine (SYNTHROID, LEVOTHROID) 50 MCG tablet Take 50 mcg by mouth daily before breakfast.    . MAGNESIUM GLYCINATE PLUS PO Take 1 tablet by mouth at bedtime.     . metFORMIN (GLUCOPHAGE) 500 MG tablet Take 2 tabs in am and 1 tab in evening    . quinapril (ACCUPRIL) 5 MG tablet Take 5 mg by mouth every morning.     . sodium bicarbonate 650 MG tablet Take 1,300 mg by mouth at bedtime.    Marland Kitchen acetaminophen (TYLENOL) 500 MG tablet Take 1,000 mg by mouth every 6 (six) hours as needed for moderate pain or headache.     . capecitabine (XELODA) 150 MG tablet Take 2 tablets (300 mg total) by mouth 2 (two) times daily after a meal. Take with three '500mg'$  tablets. Take for 14days, then hold for 7days 56 tablet 0  . capecitabine (XELODA) 500 MG tablet Take 3 tablets (1,500 mg total) by mouth 2 (two) times daily after a meal. Take with two '150mg'$  tablets. Take for 14days, then hold for 7days 84 tablet 0  . colesevelam (WELCHOL) 625 MG tablet Take 1,875 mg by mouth every evening.     . lidocaine-prilocaine (EMLA) cream Apply 1 application topically as needed. (Patient not taking: Reported on 05/21/2018) 30 g 6  . Melatonin 5 MG TABS Take 5 mg by mouth at bedtime.     . metFORMIN (GLUCOPHAGE) 500 MG tablet Take 500 mg by mouth 2 (two) times daily.      No current facility-administered medications for this visit.    Facility-Administered Medications Ordered in Other Visits  Medication Dose Route Frequency Provider Last Rate Last Dose  . sodium chloride flush (NS) 0.9 % injection 10 mL  10 mL Intravenous PRN Lequita Asal, MD   10 mL at 10/21/17 0841    Review of Systems  Constitutional: Negative for chills, diaphoresis, fever, malaise/fatigue and weight loss (up 3 pounds).       Energy level back  to normal.  HENT: Negative.  Negative for congestion, ear discharge, ear pain, nosebleeds, sinus pain, sore throat and tinnitus.   Eyes: Negative.  Negative for blurred vision, double vision, photophobia, pain and discharge.  Respiratory: Negative.  Negative for cough, hemoptysis, sputum production and shortness of breath.   Cardiovascular: Negative.  Negative for chest pain, palpitations, orthopnea, leg swelling and PND.  Gastrointestinal: Negative for abdominal pain, blood in stool, constipation, diarrhea (susceptible to diarrhea), heartburn, melena, nausea and vomiting.  Genitourinary: Negative.  Negative for dysuria, frequency, hematuria and urgency.  Musculoskeletal: Negative.  Negative for back pain, falls, joint pain, myalgias and neck pain.  Skin: Negative for itching and rash.       Seroma leaking less.  Neurological: Positive for sensory change (neuropathy). Negative for dizziness, tingling, tremors, focal weakness, weakness and headaches.  Endo/Heme/Allergies: Does not bruise/bleed easily.       Diabetes - on Metformin.  Psychiatric/Behavioral: Negative.  Negative for depression, memory loss and suicidal ideas. The patient is not nervous/anxious and does not have insomnia.   All other systems reviewed and are negative.  Performance status (ECOG): 1  Vital signs BP 132/62 (BP Location: Left Arm, Patient Position: Sitting)   Pulse 76   Temp 98.1 F (36.7 C) (Tympanic)   Resp 18   Ht '5\' 8"'$  (1.727 m)   Wt 147 lb 14.9 oz (67.1 kg)   SpO2 100%   BMI 22.49 kg/m   Physical Exam  Constitutional: She is oriented to person, place, and time and well-developed, well-nourished, and in no distress. No distress.  HENT:  Head: Normocephalic and atraumatic.  Mouth/Throat: Oropharynx is clear and moist and mucous membranes are normal. No oropharyngeal exudate.  Gray hair.  Eyes: Pupils are equal, round, and reactive to light. Conjunctivae and EOM are normal. No scleral icterus.  Glasses.   Blue.  Neck: Normal range of motion. Neck supple. No JVD present.  Cardiovascular: Normal rate, regular rhythm, normal heart sounds and intact distal pulses. Exam reveals no gallop and no friction rub.  No murmur heard. Pulmonary/Chest: Effort normal and breath sounds normal. No respiratory distress. She has no wheezes. She has no rales. Right breast exhibits skin change (new skin.  seroma with minor fluid expressed on palpation). Right breast exhibits no inverted nipple, no mass, no nipple discharge and no tenderness. Left breast exhibits no inverted nipple, no mass, no nipple discharge, no skin change and no tenderness.  Abdominal: Soft. Bowel sounds are normal. She exhibits no distension. There is no abdominal tenderness. There is no rebound and no guarding.  Musculoskeletal: Normal range of motion.        General: No tenderness.  Lymphadenopathy:    She has no cervical adenopathy.    She has no axillary adenopathy.       Right: No supraclavicular adenopathy present.       Left: No supraclavicular adenopathy present.  Neurological: She is alert and oriented to person, place, and time. Gait normal.  Skin: Skin is warm and dry. No rash noted. She is not diaphoretic. No erythema. No pallor.  Psychiatric: Mood, affect and judgment normal.  Nursing note and vitals reviewed.   Infusion on 06/18/2018  Component Date Value Ref Range Status  . CA 27.29 06/18/2018 20.3  0.0 - 38.6 U/mL Final   Comment: (NOTE) Siemens Centaur Immunochemiluminometric Methodology [facility]) Values obtained with different assay methods or kits cannot be used interchangeably. Results cannot be interpreted as absolute evidence of the presence or absence of malignant disease. Performed At: [facility] [place], [place] [id number] Rush Farmer MD [id number]   . Sodium 06/18/2018 136  135 - 145 mmol/L Final  . Potassium 06/18/2018 3.5  3.5 - 5.1 mmol/L Final  . Chloride 06/18/2018 101  98 -  111 mmol/L Final  . CO2 06/18/2018 25  22 - 32 mmol/L Final  . Glucose, Bld 06/18/2018 129* 70 - 99 mg/dL Final  . BUN 06/18/2018 19  8 - 23 mg/dL Final  . Creatinine, Ser 06/18/2018 0.72  0.44 - 1.00 mg/dL Final  . Calcium 06/18/2018 9.2  8.9 - 10.3 mg/dL Final  .  Total Protein 06/18/2018 7.0  6.5 - 8.1 g/dL Final  . Albumin 06/18/2018 4.3  3.5 - 5.0 g/dL Final  . AST 06/18/2018 22  15 - 41 U/L Final  . ALT 06/18/2018 21  0 - 44 U/L Final  . Alkaline Phosphatase 06/18/2018 55  38 - 126 U/L Final  . Total Bilirubin 06/18/2018 1.1  0.3 - 1.2 mg/dL Final  . GFR calc non Af Amer 06/18/2018 >60  >60 mL/min Final  . GFR calc Af Amer 06/18/2018 >60  >60 mL/min Final  . Anion gap 06/18/2018 10  5 - 15 Final   Performed at [facility], [address] [place], [place] [address]  . WBC 06/18/2018 3.9* 4.0 - 10.5 K/uL Final  . RBC 06/18/2018 4.03  3.87 - 5.11 MIL/uL Final  . Hemoglobin 06/18/2018 13.1  12.0 - 15.0 g/dL Final  . HCT 06/18/2018 37.5  36.0 - 46.0 % Final  . MCV 06/18/2018 93.1  80.0 - 100.0 fL Final  . MCH 06/18/2018 32.5  26.0 - 34.0 pg Final  . MCHC 06/18/2018 34.9  30.0 - 36.0 g/dL Final  . RDW 06/18/2018 13.3  11.5 - 15.5 % Final  . Platelets 06/18/2018 155  150 - 400 K/uL Final  . nRBC 06/18/2018 0.0  0.0 - 0.2 % Final  . Neutrophils Relative % 06/18/2018 61  % Final  . Neutro Abs 06/18/2018 2.4  1.7 - 7.7 K/uL Final  . Lymphocytes Relative 06/18/2018 18  % Final  . Lymphs Abs 06/18/2018 0.7  0.7 - 4.0 K/uL Final  . Monocytes Relative 06/18/2018 13  % Final  . Monocytes Absolute 06/18/2018 0.5  0.1 - 1.0 K/uL Final  . Eosinophils Relative 06/18/2018 7  % Final  . Eosinophils Absolute 06/18/2018 0.3  0.0 - 0.5 K/uL Final  . Basophils Relative 06/18/2018 0  % Final  . Basophils Absolute 06/18/2018 0.0  0.0 - 0.1 K/uL Final  . Immature Granulocytes 06/18/2018 1  % Final  . Abs Immature Granulocytes 06/18/2018 0.02  0.00 - 0.07 K/uL Final   Performed at [facility], [address], [place], [place] [address]    Assessment:  Stephanie Sanders is a 73 y.o. 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  Pathology revealed two grade II invasive mammary carcinomas of no special type (4 cm from the nipple and 5 cm from the nipple).  Tumors were in close proximity.  Lymph node biopsy confirmed metastatic carcinoma of breast origin.  Tumor is ER negative (< 1%), PR negative and Her 2/neu 2+ by IHC.  [employer] genetic testing on 07/21/2017 revealed a variant of uncertain significance in RECQL4.  Right sided mammogram and ultrasound on 05/21/2017 revealed 2 highly suspicious masses in the right breast at the 10:30 position 4 cm from nipple (1.8 x 1.2 x 1.1 cm) and at the 10:30 position 5 cm from nipple (1.1 x 0.9 x 0.9 cm).  There was a suspicious 2.7 x 1 x 2.2 cm lymph node in the right axilla with asymmetric nodular cortical thickening.  Ultrasound guided biopsy of the 2 masses and lymph node on 05/28/2017 revealed grade II invasive mammary carcinoma of no special type (4 cm from the nipple and 5 cm from the nipple).  Lymph node biopsy revealed metastatic carcinoma of breast origin.  Tumor is ER negative (< 1%), PR negative and Her 2/neu 2+ by IHC.  FISH was negative.  CA 27.29 has been followed: 16.5 on 06/04/2017 and 14.3 on 05/21/2018.  CA15-3 was 17.6 on 06/04/2017.  Echo on 06/09/2017 revealed an EF of 60-65%.  She has enrolled on the UPBEAT clinical trial.  She received 4 cycles of AC (06/23/2017 - 09/08/2017) with Udencya support.  Cycle #1 was complicated by influenza A.  Nadir counts included an ANC of 0 and platelet count of 71,000. Cycle #2 postponed due to slow recovery from influenza and excisional cyst removal with surrounding cellulitis (treated with 10 days of doxycycline).   She received 12 weeks of Taxol (09/29/2017 - 01/05/2018).  She received GCSF after week #3 and 5  Taxol secondary to borderline counts. Cycle #5 was postponed on 10/29/2017 due to patient's wishes to be treated on Tuesdays only. Week #7 Taxol was held secondary to neutropenia (ANC 200).  She received breast radiation from 03/10/2018 - 05/07/2018.  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  Normal labs on 11/24/2017 included:  B12, folate, TSH, and copper.  Right mammogram and ultrasound on 09/28/2017 revealed the 2 adjacent masses in the right breast at the 10:30 o'clock position had both decreased in size (1.8 x 1.1 x 1.2 cm to 1.2 x 0.7 x 1.1 cm; 11 x 9 x 9 mm to 7 x 6 x 6 mm) as well as the metastatic right axillary lymph node (2.7 cm to 1.6 x 0.9 x 1.7 cm).    Right partial mastectomy and sentinel lymph node biopsy on 02/01/2018 Pathology revealed 10 mm grade II invasive carcinoma of no special type.  The tumor bed encompassed the biopsy sites, with foci of residual  carcinoma at both biopsy sites and also involving the tissue between the  biopsy sites. Foci of residual carcinoma range from 1 to 10 mm.There was no DCIS.  Distance from closest margin was 1 mm.  Four of 4 lymph nodes were negative.  Pathologic stage was ypT1b (m) ypN0 (sn).  She was [facility] to [facility] from 06/28/2017 - 07/01/2017 with with fever, cough and generalized weakness.  Nasal swab was + for influenza A.  She completed a course of Tamiflu.  Symptomatically, she feels back to baseline.  She wishes to postpone initiation of Xeloda secondary to her daughter's travel plans.  Exam reveals an improving seroma.  Plan: 1. Labs today:  CBC with diff, CMP. 2. Multi-focal triple negative right breast cancer Patient s/p neoadjuvant chemotherapy, surgery, and radiation. Review no endocrine therapy as tumor is hormone receptor negative. Review plan to initiate Xeloda based on CREATE-X trial:  900 patients with HER2- breast cancer (1/3 with triple-negative disease) and residual disease after neoadjuvant chemotherapy  Patients  received 8 cycles of adjuvant Xeloda (2 weeks on/1 week off) vs no further therapy.  Overall 5 year survival was 74% vs 68% and OS was 89% vs 84%. Subgroup analysis revealed benefit in triple negative patients (DFS 70% vs 56% in the control group and OS 78.8% vs 70.3%.  Side effects:  diarrhea, neutropenia, and hand-foot syndrome. Patient wishes to start Xeloda week of 07/05/2018. Preauth Xeloda 1800 mg BID x 2 weeks on and 1 week off (full dose).   Discuss full dose or transition to full dose after initial cycle (1500 mg BID 2 weeks on/1 week off) at next visit.  Patient has a history of leukopenia with chemotherapy. Anticipate weekly assessment with first cycle of Xeloda (and possibly second cycle if 1st cycle is dose reduced). 3. Right breast seroma Continue to follow-up with Dr. Windell Moment. 4.  Health maintenance  Bone density study on 07/01/2018. 5.   RTC on 07/12/2018 for MD assessment and labs (CBC with diff, CMP).   Lequita Asal, MD  06/18/2018, 8:59 PM

## 2018-06-18 NOTE — Progress Notes (Signed)
ON PATHWAY REGIMEN - Breast  No Change  Continue With Treatment as Ordered.     A cycle is every 14 days (cycles 1-4):     Doxorubicin      Cyclophosphamide      Pegfilgrastim-xxxx    A cycle is every 21 days (cycles 5-8):     Paclitaxel      Carboplatin   **Always confirm dose/schedule in your pharmacy ordering system**  Patient Characteristics: Preoperative or Nonsurgical Candidate (Clinical Staging), Neoadjuvant Therapy followed by Surgery, Invasive Disease, Chemotherapy, HER2 Negative/Unknown/Equivocal, ER Negative/Unknown, Platinum Therapy Indicated Therapeutic Status: Preoperative or Nonsurgical Candidate (Clinical Staging) AJCC M Category: cM0 AJCC Grade: G2 Breast Surgical Plan: Neoadjuvant Therapy followed by Surgery ER Status: Negative (-) AJCC 8 Stage Grouping: IIB HER2 Status: Negative (-) AJCC T Category: cT1c AJCC N Category: cN1 PR Status: Negative (-) Type of Therapy: Platinum Therapy Indicated Intent of Therapy: Curative Intent, Discussed with Patient

## 2018-06-19 LAB — CANCER ANTIGEN 27.29: CA 27.29: 20.3 U/mL (ref 0.0–38.6)

## 2018-06-21 ENCOUNTER — Telehealth: Payer: Self-pay | Admitting: Pharmacist

## 2018-06-21 DIAGNOSIS — C50911 Malignant neoplasm of unspecified site of right female breast: Secondary | ICD-10-CM

## 2018-06-21 DIAGNOSIS — T451X5A Adverse effect of antineoplastic and immunosuppressive drugs, initial encounter: Secondary | ICD-10-CM

## 2018-06-21 DIAGNOSIS — R110 Nausea: Secondary | ICD-10-CM

## 2018-06-21 DIAGNOSIS — Z171 Estrogen receptor negative status [ER-]: Secondary | ICD-10-CM

## 2018-06-21 MED ORDER — CAPECITABINE 500 MG PO TABS: 1500.0000 mg | ORAL_TABLET | Freq: Two times a day (BID) | ORAL | 0 refills | Status: DC

## 2018-06-21 MED ORDER — CAPECITABINE 150 MG PO TABS: 300.0000 mg | ORAL_TABLET | Freq: Two times a day (BID) | ORAL | 0 refills | Status: DC

## 2018-06-21 NOTE — Telephone Encounter (Signed)
Oral Oncology Pharmacist Encounter  Received new prescription for Xeloda (capecitabine) for the treatment of triple negative clinical breast cancer, planned duration 8 cycles.  CMP from 06/18/2018 assessed, no relevant lab abnormalities. Prescription dose and frequency assessed.   Current medication list in Epic reviewed, no DDIs with capecitabine identified.  Prescription has been e-scribed to the [facility] for benefits analysis and approval.  Oral Oncology Clinic will continue to follow for insurance authorization, copayment issues, initial counseling and start date.  Darl Pikes, PharmD, BCPS, [facility] Hematology/Oncology Clinical Pharmacist [facility] [phone]  06/21/2018 10:39 AM

## 2018-06-23 NOTE — Telephone Encounter (Signed)
Scheduled Xeloda to be filled with [facility] on 3/9 for delivery 3/10.  Patient states that she is starting medication on 3/16.    Patient must fill through [facility] after 1 time retail fill with [facility].  Patient is aware.

## 2018-06-23 NOTE — Telephone Encounter (Signed)
Left patient voicemail to call me back.  Need to set up shipment of medication.  Patient also did not qualify for copay assistance due to high income.  I will inform her of this when I speak to her again.

## 2018-06-24 NOTE — Telephone Encounter (Signed)
Oral Chemotherapy Pharmacist Encounter  Patient Education I spoke with patient on 06/23/2018 for overview of new oral chemotherapy medication: Xeloda (capecitabine) for the treatment of triple negative clinical breast cancer, planned duration 8 cycles. Planned start date 07/05/2018.  Pt is doing well. Counseled patient on administration, dosing, side effects, monitoring, drug-food interactions, safe handling, storage, and disposal. Patient will take Take 2 tablets (300 mg total) by mouth 2 (two) times daily after a meal. Take with three 500mg  tablets. Take for 14days, then hold for 7days.  Side effects include but not limited to: diarrhea, hand-foot syndrome, N/V, fatigue, decreased wbc/hgb/plt.    Reviewed with patient importance of keeping a medication schedule and plan for any missed doses.  Ms. Roell voiced understanding and appreciation. All questions answered. Medication calendar and medication handout placed in the mail.   Provided patient with [facility] phone number. Patient knows to call the office with questions or concerns. Oral Chemotherapy Navigation Clinic will continue to follow.  Darl Pikes, PharmD, BCPS, [facility] Hematology/Oncology Clinical Pharmacist [facility] Oral [facility] [facility] [facility] [phone]  06/24/2018 4:20 PM

## 2018-06-28 MED FILL — CAPECITABINE 500 MG TABS: 500 | 21 days supply | Qty: 84 | Fill #0

## 2018-06-28 MED FILL — CAPECITABINE 150 MG TABS: 150 | 21 days supply | Qty: 56 | Fill #0

## 2018-07-01 ENCOUNTER — Ambulatory Visit
Admission: RE | Admit: 2018-07-01 | Discharge: 2018-07-01 | Disposition: A | Discharge status: Home / Self Care | Payer: Medicare Other | Source: Ambulatory Visit | Attending: Hematology and Oncology | Admitting: Hematology and Oncology

## 2018-07-01 ENCOUNTER — Other Ambulatory Visit: Payer: Self-pay

## 2018-07-01 DIAGNOSIS — Z171 Estrogen receptor negative status [ER-]: Secondary | ICD-10-CM | POA: Insufficient documentation

## 2018-07-01 DIAGNOSIS — Z780 Asymptomatic menopausal state: Secondary | ICD-10-CM | POA: Diagnosis present

## 2018-07-01 DIAGNOSIS — C50911 Malignant neoplasm of unspecified site of right female breast: Secondary | ICD-10-CM | POA: Diagnosis not present

## 2018-07-04 ENCOUNTER — Encounter: Payer: Self-pay | Admitting: Hematology and Oncology

## 2018-07-05 ENCOUNTER — Inpatient Hospital Stay: Payer: Medicare Other | Attending: Hematology and Oncology

## 2018-07-05 ENCOUNTER — Other Ambulatory Visit: Payer: Self-pay

## 2018-07-05 ENCOUNTER — Encounter: Payer: Self-pay | Admitting: Urgent Care

## 2018-07-05 ENCOUNTER — Inpatient Hospital Stay (HOSPITAL_BASED_OUTPATIENT_CLINIC_OR_DEPARTMENT_OTHER): Payer: Medicare Other | Admitting: Urgent Care

## 2018-07-05 VITALS — BP 136/76 | HR 98 | Temp 98.7°F | Resp 18 | Ht 68.0 in | Wt 148.0 lb

## 2018-07-05 DIAGNOSIS — Z87891 Personal history of nicotine dependence: Secondary | ICD-10-CM | POA: Diagnosis not present

## 2018-07-05 DIAGNOSIS — K521 Toxic gastroenteritis and colitis: Secondary | ICD-10-CM | POA: Insufficient documentation

## 2018-07-05 DIAGNOSIS — Z801 Family history of malignant neoplasm of trachea, bronchus and lung: Secondary | ICD-10-CM

## 2018-07-05 DIAGNOSIS — Z171 Estrogen receptor negative status [ER-]: Secondary | ICD-10-CM | POA: Insufficient documentation

## 2018-07-05 DIAGNOSIS — T451X5A Adverse effect of antineoplastic and immunosuppressive drugs, initial encounter: Secondary | ICD-10-CM | POA: Insufficient documentation

## 2018-07-05 DIAGNOSIS — G629 Polyneuropathy, unspecified: Secondary | ICD-10-CM | POA: Insufficient documentation

## 2018-07-05 DIAGNOSIS — Z9189 Other specified personal risk factors, not elsewhere classified: Secondary | ICD-10-CM

## 2018-07-05 DIAGNOSIS — N6489 Other specified disorders of breast: Secondary | ICD-10-CM | POA: Insufficient documentation

## 2018-07-05 DIAGNOSIS — C50911 Malignant neoplasm of unspecified site of right female breast: Secondary | ICD-10-CM

## 2018-07-05 DIAGNOSIS — Z5181 Encounter for therapeutic drug level monitoring: Secondary | ICD-10-CM

## 2018-07-05 LAB — CBC WITH DIFFERENTIAL/PLATELET
Abs Immature Granulocytes: 0.02 10*3/uL (ref 0.00–0.07)
Basophils Absolute: 0 10*3/uL (ref 0.0–0.1)
Basophils Relative: 1 %
Eosinophils Absolute: 0.2 10*3/uL (ref 0.0–0.5)
Eosinophils Relative: 5 %
HCT: 39.6 % (ref 36.0–46.0)
Hemoglobin: 13.8 g/dL (ref 12.0–15.0)
Immature Granulocytes: 1 %
Lymphocytes Relative: 20 %
Lymphs Abs: 0.9 10*3/uL (ref 0.7–4.0)
MCH: 32.7 pg (ref 26.0–34.0)
MCHC: 34.8 g/dL (ref 30.0–36.0)
MCV: 93.8 fL (ref 80.0–100.0)
Monocytes Absolute: 0.5 10*3/uL (ref 0.1–1.0)
Monocytes Relative: 11 %
Neutro Abs: 2.7 10*3/uL (ref 1.7–7.7)
Neutrophils Relative %: 62 %
Platelets: 179 10*3/uL (ref 150–400)
RBC: 4.22 MIL/uL (ref 3.87–5.11)
RDW: 13.2 % (ref 11.5–15.5)
WBC: 4.3 10*3/uL (ref 4.0–10.5)
nRBC: 0 % (ref 0.0–0.2)

## 2018-07-05 LAB — COMPREHENSIVE METABOLIC PANEL
ALT: 23 U/L (ref 0–44)
AST: 22 U/L (ref 15–41)
Albumin: 4.3 g/dL (ref 3.5–5.0)
Alkaline Phosphatase: 62 U/L (ref 38–126)
Anion gap: 10 (ref 5–15)
BUN: 14 mg/dL (ref 8–23)
CO2: 25 mmol/L (ref 22–32)
Calcium: 9.1 mg/dL (ref 8.9–10.3)
Chloride: 100 mmol/L (ref 98–111)
Creatinine, Ser: 0.83 mg/dL (ref 0.44–1.00)
GFR calc Af Amer: >60 mL/min (ref >60)
GFR calc non Af Amer: >60 mL/min (ref >60)
Glucose, Bld: 168 mg/dL — ABNORMAL HIGH (ref 70–99)
Potassium: 3.9 mmol/L (ref 3.5–5.1)
Sodium: 135 mmol/L (ref 135–145)
Total Bilirubin: 0.4 mg/dL (ref 0.3–1.2)
Total Protein: 6.8 g/dL (ref 6.5–8.1)

## 2018-07-05 NOTE — Progress Notes (Signed)
[facility] [address] [place],  [address] Phone: [phone]  Fax: [phone]    Clinic day:  07/05/18   Chief Complaint: Stephanie Sanders is a 73 y.o. 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  HPI:  The patient was last seen in the medical oncology clinic on 06/18/2018.  Patient was doing well.  She noted that her energy had returned to baseline. RIGHT breast seroma continue to leak.  Followed by surgery regularly.  No B symptoms or recent infections.  Bowel stable.  Exam revealed an improving seroma.  WBC 3900 ([place] 2400). CA27.29 within normal range at 20.3 u/mL.  Interval bone density performed on 07/01/2018 was normal revealing a T score of -0.8 and the RIGHT femoral neck.  In the interim, patient continues to do well.  She notes that her energy is "good", and has returned to pretreatment baseline.  Patient denies any acute concerns.  She denies any nausea, vomiting, or changes to her bowel habits.  Patient remains on daily probiotic therapy.  She denies any B symptoms or recent infections.  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 She notes that her neuropathy does not impose any functional limitations, nor does it impede her ability to ambulate safely.  Patient continues to have a postoperative seroma in her RIGHT breast.  She denies any drainage or symptoms of infection.  Patient is scheduled to see Dr. Windell Moment (general surgery) on 07/06/2018 for ongoing evaluation and treatment.  Patient denies any significant pain in her breast.  Patient has received her supply of capecitabine (Xeloda) and has brought it with her to her appointment today.  Patient has spoken with the oral chemotherapy pharmacist for formal education.  She has no questions today.    Patient advises that she maintains an adequate appetite. She is  eating well. No new weight recorded today by clinical staff.   Patient denies pain in the clinic today.   Past Medical History:  Diagnosis Date  . Cancer ([place]) 04/2017   right breast  . Diabetes mellitus without complication ([place])   . Diabetic nephropathy ([place])   . Headache    migraines  . Heart murmur    ASYMPTOMATIC  . Hypertension   . Hypothyroidism   . Personal history of chemotherapy 2019   right breast cancer  . Pneumonia   . Seborrheic keratosis   . Vitamin D deficiency     Past Surgical History:  Procedure Laterality Date  . APPENDECTOMY    . AXILLARY LYMPH NODE DISSECTION Right 02/01/2018   Procedure: AXILLARY LYMPH NODE DISSECTION;  Surgeon: Herbert Pun, MD;  Location: [facility] ORS;  Service: General;  Laterality: Right;  . BREAST BIOPSY Right 05/28/2017   right breast bx 3 areas invasive mamm ca lymph node mets  . BREAST CYST ASPIRATION Bilateral    neg  . BREAST LUMPECTOMY Right 02/01/2018  . CHOLECYSTECTOMY    . COLONOSCOPY WITH PROPOFOL N/A 01/01/2015   Procedure: COLONOSCOPY WITH PROPOFOL;  Surgeon: Josefine Class, MD;  Location: [facility];  Service: Endoscopy;  Laterality: N/A;  . EYE SURGERY Left    eyelid  . PARTIAL MASTECTOMY WITH NEEDLE LOCALIZATION Right 02/01/2018   Procedure: PARTIAL MASTECTOMY WITH NEEDLE LOCALIZATION;  Surgeon: Herbert Pun, MD;  Location: [facility];  Service: General;  Laterality: Right;  . PORTACATH PLACEMENT Right 06/10/2017   Procedure: INSERTION PORT-A-CATH;  Surgeon: Herbert Pun, MD;  Location: [facility] OR[facility];  Service: General;  Laterality: Right;  . SENTINEL NODE BIOPSY Right 02/01/2018   Procedure: SENTINEL NODE BIOPSY;  Surgeon: Herbert Pun, MD;  Location: [facility];  Service: General;  Laterality: Right;  . STENT PLACEMENT ILIAC ([place] [facility])     temporary placement s/p cholecystectomy  . TUBAL LIGATION      Family History  Problem Relation Age of Onset  . Parkinson's disease Mother    . Cancer Maternal Aunt   . Cancer Father   . Lung cancer Father   . Breast cancer Neg Hx     Social History:  reports that she quit smoking about 46 years ago. Her smoking use included cigarettes. She has a 20.00 pack-year smoking history. She has never used smokeless tobacco. She reports that she does not drink alcohol or use drugs.  She has 3 children (daughters: age 40 and 39; son Jaquelyn Bitter): age 75).  She is retired.  She worked for the [employer].  Her husband has dementia.  She lives in [place].  She is alone today.  Allergies:  Allergies  Allergen Reactions  . Fiorinal [Butalbital-Aspirin-Caffeine] Nausea And Vomiting and Other (See Comments)    SEVERE HEADACHE  . Sulfa Antibiotics Other (See Comments)    Burning esophagus and stomach  . Tramadol Nausea And Vomiting  . Other Rash    STERI-STRIPS-RASH    Current Medications: Current Outpatient Medications  Medication Sig Dispense Refill  . atorvastatin (LIPITOR) 10 MG tablet Take 10 mg by mouth every evening.     . capecitabine (XELODA) 500 MG tablet Take 3 tablets (1,500 mg total) by mouth 2 (two) times daily after a meal. Take with two 150mg  tablets. Take for 14days, then hold for 7days 84 tablet 0  . Cholecalciferol 2000 UNITS CAPS Take 4,000 Units by mouth daily.     . colesevelam (WELCHOL) 625 MG tablet Take 1,875 mg by mouth every evening.     Marland Kitchen levothyroxine (SYNTHROID, LEVOTHROID) 50 MCG tablet Take 50 mcg by mouth daily before breakfast.    . MAGNESIUM GLYCINATE PLUS PO Take 1 tablet by mouth at bedtime.     . Melatonin 5 MG TABS Take 5 mg by mouth at bedtime.     . metFORMIN (GLUCOPHAGE) 500 MG tablet 1,000 mg 2 (two) times daily with a meal.     . quinapril (ACCUPRIL) 5 MG tablet Take 5 mg by mouth every morning.     . sodium bicarbonate 650 MG tablet Take 1,300 mg by mouth at bedtime.    Marland Kitchen acetaminophen (TYLENOL) 500 MG tablet Take 1,000 mg by mouth every 6 (six) hours as needed for moderate pain or headache.     .  capecitabine (XELODA) 150 MG tablet Take 2 tablets (300 mg total) by mouth 2 (two) times daily after a meal. Take with three 500mg  tablets. Take for 14days, then hold for 7days 56 tablet 0  . lidocaine-prilocaine (EMLA) cream Apply 1 application topically as needed. (Patient not taking: Reported on 05/21/2018) 30 g 6  . metFORMIN (GLUCOPHAGE) 500 MG tablet Take 500 mg by mouth 2 (two) times daily.      No current facility-administered medications for this visit.    Facility-Administered Medications Ordered in Other Visits  Medication Dose Route Frequency Provider Last Rate Last Dose  . sodium chloride flush (NS) 0.9 % injection 10 mL  10 mL Intravenous PRN Lequita Asal, MD   10 mL at 10/21/17 [phone]  Review of Systems  Constitutional: Negative for chills, diaphoresis, fever, malaise/fatigue and weight loss (no new weight recorded).       "I am feeling well today.  I am back to my old self.  Now it is time to start more treatment".  Energy at baseline.  HENT: Negative.  Negative for congestion, ear discharge, ear pain, nosebleeds, sinus pain, sore throat and tinnitus.   Eyes: Negative.  Negative for blurred vision, double vision, photophobia, pain and discharge.  Respiratory: Negative.  Negative for cough, hemoptysis, sputum production and shortness of breath.   Cardiovascular: Negative.  Negative for chest pain, palpitations, orthopnea, leg swelling and PND.  Gastrointestinal: Negative for abdominal pain, blood in stool, constipation, diarrhea, heartburn, melena, nausea and vomiting.  Genitourinary: Negative.  Negative for dysuria, frequency, hematuria and urgency.  Musculoskeletal: Negative.  Negative for back pain, falls, joint pain, myalgias and neck pain.  Skin: Negative for itching and rash.       Postoperative seroma in RIGHT breast.  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 Negative for dizziness, tingling, tremors, focal weakness,  weakness and headaches.  Endo/Heme/Allergies: Does not bruise/bleed easily.       PMH (+) for diabetes - on Metformin.  Psychiatric/Behavioral: Negative.  Negative for depression, memory loss and suicidal ideas. The patient is not nervous/anxious and does not have insomnia.   All other systems reviewed and are negative.  Performance status (ECOG): 1 - Symptomatic but completely ambulatory  Vital signs BP 136/76 (BP Location: Left Arm, Patient Position: Sitting)   Pulse 98   Temp 98.7 F (37.1 C) (Tympanic)   Resp 18   SpO2 99%   Physical Exam  Constitutional: She is oriented to person, place, and time and well-developed, well-nourished, and in no distress.  HENT:  Head: Normocephalic and atraumatic.  Mouth/Throat: Oropharynx is clear and moist and mucous membranes are normal.  Eyes: Pupils are equal, round, and reactive to light. EOM are normal. No scleral icterus.  Neck: Normal range of motion. Neck supple. No tracheal deviation present. No thyromegaly present.  Cardiovascular: Normal rate, regular rhythm, normal heart sounds and intact distal pulses. Exam reveals no gallop and no friction rub.  No murmur heard. Pulmonary/Chest: Effort normal and breath sounds normal. No respiratory distress. She has no wheezes. She has no rales.  Abdominal: Soft. Bowel sounds are normal. She exhibits no distension. There is no abdominal tenderness.  Musculoskeletal: Normal range of motion.        General: No tenderness or edema.  Lymphadenopathy:    She has no cervical adenopathy.    She has no axillary adenopathy.       Right: No inguinal and no supraclavicular adenopathy present.       Left: No inguinal and no supraclavicular adenopathy present.  Neurological: She is alert and oriented to person, place, and time.  Skin: Skin is warm and dry. No rash noted. No erythema.  Psychiatric: Mood, affect and judgment normal.  Nursing note and vitals reviewed.   Appointment on 07/05/2018  Component  Date Value Ref Range Status  . Sodium 07/05/2018 135  135 - 145 mmol/L Final  . Potassium 07/05/2018 3.9  3.5 - 5.1 mmol/L Final  . Chloride 07/05/2018 100  98 - 111 mmol/L Final  . CO2 07/05/2018 25  22 - 32 mmol/L Final  . Glucose, Bld 07/05/2018 168* 70 - 99 mg/dL Final  . BUN 07/05/2018 14  8 - 23 mg/dL Final  .  Creatinine, Ser 07/05/2018 0.83  0.44 - 1.00 mg/dL Final  . Calcium 07/05/2018 9.1  8.9 - 10.3 mg/dL Final  . Total Protein 07/05/2018 6.8  6.5 - 8.1 g/dL Final  . Albumin 07/05/2018 4.3  3.5 - 5.0 g/dL Final  . AST 07/05/2018 22  15 - 41 U/L Final  . ALT 07/05/2018 23  0 - 44 U/L Final  . Alkaline Phosphatase 07/05/2018 62  38 - 126 U/L Final  . Total Bilirubin 07/05/2018 0.4  0.3 - 1.2 mg/dL Final  . GFR calc non Af Amer 07/05/2018 >60  >60 mL/min Final  . GFR calc Af Amer 07/05/2018 >60  >60 mL/min Final  . Anion gap 07/05/2018 10  5 - 15 Final   Performed at [facility], [address] [place], [place] [address]  . WBC 07/05/2018 4.3  4.0 - 10.5 K/uL Final  . RBC 07/05/2018 4.22  3.87 - 5.11 MIL/uL Final  . Hemoglobin 07/05/2018 13.8  12.0 - 15.0 g/dL Final  . HCT 07/05/2018 39.6  36.0 - 46.0 % Final  . MCV 07/05/2018 93.8  80.0 - 100.0 fL Final  . MCH 07/05/2018 32.7  26.0 - 34.0 pg Final  . MCHC 07/05/2018 34.8  30.0 - 36.0 g/dL Final  . RDW 07/05/2018 13.2  11.5 - 15.5 % Final  . Platelets 07/05/2018 179  150 - 400 K/uL Final  . nRBC 07/05/2018 0.0  0.0 - 0.2 % Final  . Neutrophils Relative % 07/05/2018 62  % Final  . Neutro Abs 07/05/2018 2.7  1.7 - 7.7 K/uL Final  . Lymphocytes Relative 07/05/2018 20  % Final  . Lymphs Abs 07/05/2018 0.9  0.7 - 4.0 K/uL Final  . Monocytes Relative 07/05/2018 11  % Final  . Monocytes Absolute 07/05/2018 0.5  0.1 - 1.0 K/uL Final  . Eosinophils Relative 07/05/2018 5  % Final  . Eosinophils Absolute 07/05/2018 0.2  0.0 - 0.5 K/uL Final  . Basophils Relative 07/05/2018 1  % Final  . Basophils Absolute 07/05/2018  0.0  0.0 - 0.1 K/uL Final  . Immature Granulocytes 07/05/2018 1  % Final  . Abs Immature Granulocytes 07/05/2018 0.02  0.00 - 0.07 K/uL Final   Performed at [facility], [address] [place], [place] [address]    Assessment:  NAJAE FILSAIME is a 73 y.o. 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  Pathology revealed two grade II invasive mammary carcinomas of no special type (4 cm from the nipple and 5 cm from the nipple).  Tumors were in close proximity.  Lymph node biopsy confirmed metastatic carcinoma of breast origin.  Tumor is ER negative (< 1%), PR negative and Her 2/neu 2+ by IHC.  [facility] genetic testing on 07/21/2017 revealed a variant of uncertain significance in RECQL4.  Right sided mammogram and ultrasound on 05/21/2017 revealed 2 highly suspicious masses in the right breast at the 10:30 position 4 cm from nipple (1.8 x 1.2 x 1.1 cm) and at the 10:30 position 5 cm from nipple (1.1 x 0.9 x 0.9 cm).  There was a suspicious 2.7 x 1 x 2.2 cm lymph node in the right axilla with asymmetric nodular cortical thickening.  Ultrasound guided biopsy of the 2 masses and lymph node on 05/28/2017 revealed grade II invasive mammary carcinoma of no special type (4 cm from the nipple and 5 cm from the nipple).  Lymph node biopsy revealed metastatic carcinoma  of breast origin.  Tumor is ER negative (< 1%), PR negative and Her 2/neu 2+ by IHC.  FISH was negative.    CA 27.29 has been followed: 16.5 on 06/04/2017, 14.3 on 05/21/2018, and 20.3 on 06/18/2018.  CA15-3 was 17.6 on 06/04/2017.  Echo on 06/09/2017 revealed an EF of 60-65%.  She has enrolled on the UPBEAT clinical trial.  She received 4 cycles of AC (06/23/2017 - 09/08/2017) with Udencya support.  Cycle #1 was complicated by influenza A.  Nadir counts included an ANC of 0 and platelet count of 71,000. Cycle #2 postponed due to slow recovery from influenza and  excisional cyst removal with surrounding cellulitis (treated with 10 days of doxycycline).   She received 12 weeks of Taxol (09/29/2017 - 01/05/2018).  She received GCSF after week #3 and 5 Taxol secondary to borderline counts. Cycle #5 was postponed on 10/29/2017 due to patient's wishes to be treated on Tuesdays only. Week #7 Taxol was held secondary to neutropenia (ANC 200).  She received breast radiation from 03/10/2018 - 05/07/2018.  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  Normal labs on 11/24/2017 included:  B12, folate, TSH, and copper.  Right mammogram and ultrasound on 09/28/2017 revealed the 2 adjacent masses in the right breast at the 10:30 o'clock position had both decreased in size (1.8 x 1.1 x 1.2 cm to 1.2 x 0.7 x 1.1 cm; 11 x 9 x 9 mm to 7 x 6 x 6 mm) as well as the metastatic right axillary lymph node (2.7 cm to 1.6 x 0.9 x 1.7 cm).    Right partial mastectomy and sentinel lymph node biopsy on 02/01/2018 Pathology revealed 10 mm grade II invasive carcinoma of no special type.  The tumor bed encompassed the biopsy sites, with foci of residual  carcinoma at both biopsy sites and also involving the tissue between the  biopsy sites. Foci of residual carcinoma range from 1 to 10 mm.There was no DCIS.  Distance from closest margin was 1 mm.  Four of 4 lymph nodes were negative.  Pathologic stage was ypT1b (m) ypN0 (sn).  Bone density on 07/01/2018 was normal revealed a T score of -0.8 and the RIGHT femoral neck.  She was admitted to [facility] from 06/28/2017 - 07/01/2017 with with fever, cough and generalized weakness.  Nasal swab was + for influenza A.  She completed a course of Tamiflu.  Symptomatically, patient is doing well overall.  She notes that her energy is back to baseline.  She denies any acute concerns.  No nausea, vomiting, or changes to her bowel habits.  Patient on daily probiotic therapy to promote bowel health.  No shortness of breath or episodes of chest pain.  Patient  has stable neuropathy in her left hand and foot only.  She continues to have drainage from a postoperative seroma in her RIGHT breast.  Exam grossly unchanged from previous.  WBC 4300 ([place] 2700).  Potassium 3.9 mmol/L.  Creatinine 0.83 mg/dL.   Plan: 1. Labs today:  CBC with diff, CMP. 2. Multifocal triple negative RIGHT breast cancer  Doing well overall.  Has returned to pretreatment baseline.  Status post neoadjuvant chemotherapy, surgery, and radiation.  Patient will not receive endocrine therapy as tumor was ER/PR (-).  Review plans for continued treatment using oral capecitabine (Xeloda).   Patient has spoken with oral chemotherapy pharmacist, and all questions and concerns have been addressed.  Side effects discussed at length for clarification purposes today.  Discuss concerns related to fragile  GI system resulting in treatment related diarrhea.   Taking probiotics to promote gut health.   Encouraged to contact clinic with ANY diarrhea.  Encouraged to maintain adequate hydration and incorporate ORS (Gatorade or Pedialyte) should she experience any diarrhea.   Review plans for 8 cycles of adjuvant capecitabine (2 weeks on and 1 week off)  CREATE-X trial results (capecitabine vs. no treatment): 5 year survival was 74% vs 68% and OS was 89% vs 84%.  Patient provides verbal understanding and consent to proceeding with treatment as planned.  Will initiate oral capecitabine starting on the morning of 07/06/2018.  Will RTC weekly for the initial cycle for assessment and labs. If stable, will be seen once prior to the beginning of each cycle.  3. RIGHT breast seroma  Stable to improved.  No signs and symptoms of infection.  Continues to have intermittent drainage from RIGHT breast.   Patient scheduled to follow-up with Dr. Windell Moment (surgery) on 07/06/2018 for ongoing evaluation and management. 4. Bone density  Review bone density from 07/01/2018  T score of -0.8 and  the RIGHT femoral neck.  On oral cholecalciferol 4000 units daily.  5. RTC in 1 week for MD assessment and labs (CBC with diff, CMP)   Honor Loh, NP  07/05/18, 3:01 PM

## 2018-07-05 NOTE — Progress Notes (Signed)
No new changes noted today 

## 2018-07-09 ENCOUNTER — Other Ambulatory Visit: Payer: Self-pay

## 2018-07-11 ENCOUNTER — Other Ambulatory Visit: Payer: Self-pay

## 2018-07-11 ENCOUNTER — Other Ambulatory Visit: Payer: Self-pay | Admitting: Hematology and Oncology

## 2018-07-11 NOTE — Progress Notes (Signed)
[facility] [address] [place], [place] [address] Phone: [phone]  Fax: [phone]   Clinic day:  07/12/2018    Chief Complaint: Stephanie Sanders is a 73 y.o. 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  HPI:  The patient was last seen in the medical oncology clinic on 06/18/2018.  At that time, she felt back to baseline.  She wished to postpone initiation of Xeloda secondary to her daughter's travel plans.  Exam revealed an improving seroma.  She was seen by Honor Loh, NP on 07/05/2018.   She continued to have drainage from a postoperative seroma in her RIGHT breast.  Decision was made to initiate Xeloda on 07/06/2018.  CBC revealed a hematocrit of 39.6, hemoglobin 13.8, MCV 93.8, platelets 179,000, WBC 4300 with an ANC of 2700.  During the interim, patient is doing well overall. She is tolerating her oral capecitabine 1800 mg daily dose well. She initially had a "bad taste" in her mouth, which resolved quickly. Patient has not had any issues with her bowels until today. She notes that she had a single loose stool earlier today. She denies any associated nausea, vomiting, or abdominal pain. Patient denies any oral complaints. Patient denies palmar-plantar erythrodysesthesia, neuropathy, and cold sensitivity associated with the current treatment regimen.   Patient advises that she maintains an adequate appetite. She is eating well. Weight today is 146 lb 15 oz (66.7 kg), which compared to her last visit to the clinic, represents a 2 pound decrease.    Patient denies pain in the clinic today.   Past Medical History:  Diagnosis Date   Cancer ([place]) 04/2017   right breast   Diabetes mellitus without complication (HCC)    Diabetic nephropathy (HCC)    Headache    migraines   Heart murmur    ASYMPTOMATIC   Hypertension    Hypothyroidism    Personal history of  chemotherapy 2019   right breast cancer   Pneumonia    Seborrheic keratosis    Vitamin D deficiency     Past Surgical History:  Procedure Laterality Date   APPENDECTOMY     AXILLARY LYMPH NODE DISSECTION Right 02/01/2018   Procedure: AXILLARY LYMPH NODE DISSECTION;  Surgeon: Herbert Pun, MD;  Location: [facility] ORS;  Service: General;  Laterality: Right;   BREAST BIOPSY Right 05/28/2017   right breast bx 3 areas invasive mamm ca lymph node mets   BREAST CYST ASPIRATION Bilateral    neg   BREAST LUMPECTOMY Right 02/01/2018   CHOLECYSTECTOMY     COLONOSCOPY WITH PROPOFOL N/A 01/01/2015   Procedure: COLONOSCOPY WITH PROPOFOL;  Surgeon: Josefine Class, MD;  Location: [facility] ENDOSCOPY;  Service: Endoscopy;  Laterality: N/A;   EYE SURGERY Left    eyelid   PARTIAL MASTECTOMY WITH NEEDLE LOCALIZATION Right 02/01/2018   Procedure: PARTIAL MASTECTOMY WITH NEEDLE LOCALIZATION;  Surgeon: Herbert Pun, MD;  Location: [facility] ORS;  Service: General;  Laterality: Right;   PORTACATH PLACEMENT Right 06/10/2017   Procedure: INSERTION PORT-A-CATH;  Surgeon: Herbert Pun, MD;  Location: [facility] ORS;  Service: General;  Laterality: Right;   SENTINEL NODE BIOPSY Right 02/01/2018   Procedure: SENTINEL NODE BIOPSY;  Surgeon: Herbert Pun, MD;  Location: [facility] ORS;  Service: General;  Laterality: Right;   STENT PLACEMENT ILIAC ([place] HX)     temporary placement s/p cholecystectomy   TUBAL LIGATION      Family  History  Problem Relation Age of Onset   Parkinson's disease Mother    Cancer Maternal Aunt    Cancer Father    Lung cancer Father    Breast cancer Neg Hx     Social History:  reports that she quit smoking about 46 years ago. Her smoking use included cigarettes. She has a 20.00 pack-year smoking history. She has never used smokeless tobacco. She reports that she does not drink alcohol or use drugs.  She has 3 children (daughters: age 21 and 54; son  Stephanie Sanders): age 13).  She is retired.  She worked for the [employer].  Her husband has dementia.  She lives in [place].  She is alone today.  Allergies:  Allergies  Allergen Reactions   Fiorinal [Butalbital-Aspirin-Caffeine] Nausea And Vomiting and Other (See Comments)    SEVERE HEADACHE   Sulfa Antibiotics Other (See Comments)    Burning esophagus and stomach   Tramadol Nausea And Vomiting   Other Rash    STERI-STRIPS-RASH    Current Medications: Current Outpatient Medications  Medication Sig Dispense Refill   acetaminophen (TYLENOL) 500 MG tablet Take 1,000 mg by mouth every 6 (six) hours as needed for moderate pain or headache.      atorvastatin (LIPITOR) 10 MG tablet Take 10 mg by mouth every evening.      capecitabine (XELODA) 150 MG tablet Take 2 tablets (300 mg total) by mouth 2 (two) times daily after a meal. Take with three 500mg  tablets. Take for 14days, then hold for 7days 56 tablet 0   capecitabine (XELODA) 500 MG tablet Take 3 tablets (1,500 mg total) by mouth 2 (two) times daily after a meal. Take with two 150mg  tablets. Take for 14days, then hold for 7days 84 tablet 0   Cholecalciferol 2000 UNITS CAPS Take 4,000 Units by mouth daily.      levothyroxine (SYNTHROID, LEVOTHROID) 50 MCG tablet Take 50 mcg by mouth daily before breakfast.     lidocaine-prilocaine (EMLA) cream Apply 1 application topically as needed. 30 g 6   MAGNESIUM GLYCINATE PLUS PO Take 1 tablet by mouth at bedtime.      Melatonin 5 MG TABS Take 5 mg by mouth daily as needed (for sleep.).      metFORMIN (GLUCOPHAGE) 500 MG tablet 1,000 mg 2 (two) times daily with a meal.      quinapril (ACCUPRIL) 5 MG tablet Take 5 mg by mouth every morning.      sodium bicarbonate 650 MG tablet Take 1,300 mg by mouth at bedtime.     No current facility-administered medications for this visit.    Facility-Administered Medications Ordered in Other Visits  Medication Dose Route Frequency Provider Last Rate Last  Dose   sodium chloride flush (NS) 0.9 % injection 10 mL  10 mL Intravenous PRN Lequita Asal, MD   10 mL at 10/21/17 0841    Review of Systems  Constitutional: Positive for weight loss (down 2 pounds). Negative for chills, diaphoresis, fever and malaise/fatigue.       "I am doing well".  HENT: Negative.  Negative for congestion, ear discharge, ear pain, nosebleeds, sinus pain, sore throat and tinnitus.   Eyes: Negative.  Negative for blurred vision, double vision, photophobia, pain and discharge.  Respiratory: Negative.  Negative for cough, hemoptysis, sputum production and shortness of breath.   Cardiovascular: Negative.  Negative for chest pain, palpitations, orthopnea, leg swelling and PND.  Gastrointestinal: Positive for diarrhea (loose stools today only). Negative for abdominal pain, blood  in stool, constipation, heartburn, melena, nausea and vomiting.  Genitourinary: Negative.  Negative for dysuria, frequency, hematuria and urgency.  Musculoskeletal: Negative.  Negative for back pain, falls, joint pain, myalgias and neck pain.  Skin: Negative for itching and rash.       Postoperative seroma in RIGHT breast.  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 Negative for dizziness, tingling, tremors, focal weakness, weakness and headaches.  Endo/Heme/Allergies: Does not bruise/bleed easily.       PMH (+) for diabetes  Psychiatric/Behavioral: Negative.  Negative for depression, memory loss and suicidal ideas. The patient is not nervous/anxious and does not have insomnia.   All other systems reviewed and are negative.  Performance status (ECOG): 1 - Symptomatic but completely ambulatory  Vital signs BP 126/77 (BP Location: Left Arm, Patient Position: Sitting)    Pulse 78    Temp 98.2 F (36.8 C) (Tympanic)    Resp 16    Wt 146 lb 15 oz (66.7 kg)    SpO2 100%    BMI 22.34 kg/m   Physical Exam  Constitutional: She is oriented to person,  place, and time and well-developed, well-nourished, and in no distress. No distress.  HENT:  Head: Normocephalic and atraumatic.  Mouth/Throat: Oropharynx is clear and moist and mucous membranes are normal. No oropharyngeal exudate.  Short gray hair.  Eyes: Pupils are equal, round, and reactive to light. Conjunctivae and EOM are normal. No scleral icterus.  Glasses.  Blue eyes.  Neck: Normal range of motion. Neck supple. No JVD present.  Cardiovascular: Normal rate, regular rhythm, normal heart sounds and intact distal pulses. Exam reveals no gallop and no friction rub.  No murmur heard. Pulmonary/Chest: Effort normal and breath sounds normal. No respiratory distress. She has no wheezes. She has no rales.  Abdominal: Soft. Bowel sounds are normal. She exhibits no distension and no mass. There is no abdominal tenderness.  Musculoskeletal: Normal range of motion.        General: No tenderness or edema.  Lymphadenopathy:    She has no cervical adenopathy.    She has no axillary adenopathy.       Right: No inguinal and no supraclavicular adenopathy present.       Left: No inguinal and no supraclavicular adenopathy present.  Neurological: She is alert and oriented to person, place, and time. Gait normal.  Skin: Skin is warm and dry. No rash noted. She is not diaphoretic. No erythema. No pallor.  No palmar erythema.  Psychiatric: Mood, affect and judgment normal.  Nursing note and vitals reviewed.   Appointment on 07/12/2018  Component Date Value Ref Range Status   Magnesium 07/12/2018 1.9  1.7 - 2.4 mg/dL Final   Performed at [facility], [address], [place], [place] [address]   Sodium 07/12/2018 135  135 - 145 mmol/L Final   Potassium 07/12/2018 4.0  3.5 - 5.1 mmol/L Final   Chloride 07/12/2018 100  98 - 111 mmol/L Final   CO2 07/12/2018 27  22 - 32 mmol/L Final   Glucose, Bld 07/12/2018 167* 70 - 99 mg/dL Final   BUN 07/12/2018 12  8 - 23 mg/dL Final    Creatinine, Ser 07/12/2018 0.81  0.44 - 1.00 mg/dL Final   Calcium 07/12/2018 9.6  8.9 - 10.3 mg/dL Final   Total Protein 07/12/2018 7.0  6.5 - 8.1 g/dL Final   Albumin 07/12/2018 4.3  3.5 - 5.0 g/dL Final   AST 07/12/2018 23  15 - 41 U/L Final   ALT 07/12/2018 24  0 - 44 U/L Final   Alkaline Phosphatase 07/12/2018 56  38 - 126 U/L Final   Total Bilirubin 07/12/2018 1.2  0.3 - 1.2 mg/dL Final   GFR calc non Af Amer 07/12/2018 >60  >60 mL/min Final   GFR calc Af Amer 07/12/2018 >60  >60 mL/min Final   Anion gap 07/12/2018 8  5 - 15 Final   Performed at [facility] [address] [place], [place] [address]   WBC 07/12/2018 3.9* 4.0 - 10.5 K/uL Final   RBC 07/12/2018 4.26  3.87 - 5.11 MIL/uL Final   Hemoglobin 07/12/2018 13.7  12.0 - 15.0 g/dL Final   HCT 07/12/2018 40.1  36.0 - 46.0 % Final   MCV 07/12/2018 94.1  80.0 - 100.0 fL Final   MCH 07/12/2018 32.2  26.0 - 34.0 pg Final   MCHC 07/12/2018 34.2  30.0 - 36.0 g/dL Final   RDW 07/12/2018 12.9  11.5 - 15.5 % Final   Platelets 07/12/2018 148* 150 - 400 K/uL Final   nRBC 07/12/2018 0.0  0.0 - 0.2 % Final   Neutrophils Relative % 07/12/2018 66  % Final   Neutro Abs 07/12/2018 2.5  1.7 - 7.7 K/uL Final   Lymphocytes Relative 07/12/2018 18  % Final   Lymphs Abs 07/12/2018 0.7  0.7 - 4.0 K/uL Final   Monocytes Relative 07/12/2018 10  % Final   Monocytes Absolute 07/12/2018 0.4  0.1 - 1.0 K/uL Final   Eosinophils Relative 07/12/2018 5  % Final   Eosinophils Absolute 07/12/2018 0.2  0.0 - 0.5 K/uL Final   Basophils Relative 07/12/2018 1  % Final   Basophils Absolute 07/12/2018 0.0  0.0 - 0.1 K/uL Final   Immature Granulocytes 07/12/2018 0  % Final   Abs Immature Granulocytes 07/12/2018 0.01  0.00 - 0.07 K/uL Final   Performed at [facility], [address] [place], [place] [address]    Assessment:  TEHANI MERSMAN is a 73 y.o. female with multi-focal triple negative clinical  stage [id number] right breast cancer s/p biopsy on 05/28/2017.  Pathology revealed two grade II invasive mammary carcinomas of no special type (4 cm from the nipple and 5 cm from the nipple).  Tumors were in close proximity.  Lymph node biopsy confirmed metastatic carcinoma of breast origin.  Tumor is ER negative (< 1%), PR negative and Her 2/neu 2+ by IHC.  [employer] genetic testing on 07/21/2017 revealed a variant of uncertain significance in RECQL4.  Right sided mammogram and ultrasound on 05/21/2017 revealed 2 highly suspicious masses in the right breast at the 10:30 position 4 cm from nipple (1.8 x 1.2 x 1.1 cm) and at the 10:30 position 5 cm from nipple (1.1 x 0.9 x 0.9 cm).  There was a suspicious 2.7 x 1 x 2.2 cm lymph node in the right axilla with asymmetric nodular cortical thickening.  Ultrasound guided biopsy of the 2 masses and lymph node on 05/28/2017 revealed grade II invasive mammary carcinoma of no special type (4 cm from the nipple and 5 cm from the nipple).  Lymph node biopsy revealed metastatic carcinoma of breast origin.  Tumor is ER negative (< 1%), PR negative and Her 2/neu 2+ by IHC.  FISH was negative.    CA 27.29 has been followed: 16.5 on 06/04/2017 and 14.3 on 05/21/2018.  CA15-3 was 17.6 on 06/04/2017.  Echo on 06/09/2017 revealed an EF of 60-65%.  She has enrolled on the UPBEAT clinical trial.  She received 4 cycles of AC (06/23/2017 - 09/08/2017) with Udencya support.  Cycle #1 was complicated by influenza A.  Nadir counts included an ANC of 0 and platelet count of 71,000. Cycle #2 postponed due to slow recovery from influenza and excisional cyst removal with surrounding cellulitis (treated with 10 days of doxycycline).   She received 12 weeks of Taxol (09/29/2017 - 01/05/2018).  She received GCSF after week #3 and 5 Taxol secondary to borderline counts. Cycle #5 was postponed on 10/29/2017 due to patient's wishes to be treated on Tuesdays only. Week #7 Taxol was held secondary  to neutropenia (ANC 200).  Right mammogram and ultrasound on 09/28/2017 revealed the 2 adjacent masses in the right breast at the 10:30 o'clock position had both decreased in size (1.8 x 1.1 x 1.2 cm to 1.2 x 0.7 x 1.1 cm; 11 x 9 x 9 mm to 7 x 6 x 6 mm) as well as the metastatic right axillary lymph node (2.7 cm to 1.6 x 0.9 x 1.7 cm).    Right partial mastectomy and sentinel lymph node biopsy on 02/01/2018 Pathology revealed 10 mm grade II invasive carcinoma of no special type.  The tumor bed encompassed the biopsy sites, with foci of residual  carcinoma at both biopsy sites and also involving the tissue between the  biopsy sites. Foci of residual carcinoma range from 1 to 10 mm.There was no DCIS.  Distance from closest margin was 1 mm.  Four of 4 lymph nodes were negative.  Pathologic stage was ypT1b (m) ypN0 (sn).  She received breast radiation from 03/10/2018 - 05/07/2018.  She is day 7 of cycle #1 Xeloda (began 07/06/2018).  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  Normal labs on 11/24/2017 included:  B12, folate, TSH, and copper.  Bone density on 07/01/2018 was normal with a T score of -0.8 and the RIGHT femoral neck.  She was admitted to [facility] from 06/28/2017 - 07/01/2017 with with fever, cough and generalized weakness.  Nasal swab was + for influenza A.  She completed a course of Tamiflu.  Symptomatically, she is doing well overall.  She denies any significant treatment-related side effects.  No mouth sores or hand tenderness.  Bowels have been stable until today when patient experienced a single episode of loose stools.  No B symptoms or interval infections.  Exam is grossly unremarkable.  WBC 3900 ([place] 2500).  Platelets 148,000.  Plan: 1. Labs today: CBC with diff, CMP, Mg. 2. Multifocal triple negative RIGHT breast cancer  Doing well overall.  No acute concerns.   Tolerated week 1 of oral capecitabine 1800 mg well  One loose stool today.   Review concerns  related to fragile GI system resulting in treatment related diarrhea.  Continues on daily probiotic therapy to promote gut health.  Encouraged to contact the clinic should she develop any degree of diarrhea.  Encouraged to maintain adequate hydration incorporate ORS (Gatorade or Pedialyte) should she experience any diarrhea.  Will have clinic nursing staff follow-up with patient on 07/16/2018 by phone to determine whether or not loose stools resolved.  Review plans for 8 cycles of adjuvant capecitabine (2 weeks on and one-week off).  Monitor counts and LFTs weekly for the initial cycle.  If remains stable, discussed plan to see patient and check labs in the beginning of each cycle. 3. RIGHT breast seroma  Stable.  Drainage has been minimal.  Followed by surgery (  Windell Moment, MD).  Continue to monitor and follow-up with surgery for ongoing evaluation and management. 4. RTC on 07/19/2018 for MD assessment and labs (CBC with diff, CMP).   Honor Loh, NP  07/12/2018 , 11:51 AM   I saw and evaluated the patient, participating in the key portions of the service and reviewing pertinent diagnostic studies and records.  I reviewed the nurse practitioner's note and agree with the findings and the plan.  The assessment and plan were discussed with the patient.  Several questions were asked by the patient and answered.   Nolon Stalls, MD 07/12/2018,11:51 AM

## 2018-07-12 ENCOUNTER — Other Ambulatory Visit: Payer: Medicare Other

## 2018-07-12 ENCOUNTER — Encounter: Payer: Self-pay | Admitting: Hematology and Oncology

## 2018-07-12 ENCOUNTER — Ambulatory Visit: Payer: Medicare Other | Admitting: Hematology and Oncology

## 2018-07-12 ENCOUNTER — Inpatient Hospital Stay: Payer: Medicare Other

## 2018-07-12 ENCOUNTER — Inpatient Hospital Stay (HOSPITAL_BASED_OUTPATIENT_CLINIC_OR_DEPARTMENT_OTHER): Payer: Medicare Other | Admitting: Hematology and Oncology

## 2018-07-12 ENCOUNTER — Other Ambulatory Visit: Payer: Self-pay

## 2018-07-12 VITALS — BP 126/77 | HR 78 | Temp 98.2°F | Resp 16 | Wt 146.9 lb

## 2018-07-12 DIAGNOSIS — C50911 Malignant neoplasm of unspecified site of right female breast: Secondary | ICD-10-CM

## 2018-07-12 DIAGNOSIS — K521 Toxic gastroenteritis and colitis: Secondary | ICD-10-CM

## 2018-07-12 DIAGNOSIS — Z171 Estrogen receptor negative status [ER-]: Secondary | ICD-10-CM

## 2018-07-12 LAB — COMPREHENSIVE METABOLIC PANEL
ALT: 24 U/L (ref 0–44)
AST: 23 U/L (ref 15–41)
Albumin: 4.3 g/dL (ref 3.5–5.0)
Alkaline Phosphatase: 56 U/L (ref 38–126)
Anion gap: 8 (ref 5–15)
BUN: 12 mg/dL (ref 8–23)
CO2: 27 mmol/L (ref 22–32)
Calcium: 9.6 mg/dL (ref 8.9–10.3)
Chloride: 100 mmol/L (ref 98–111)
Creatinine, Ser: 0.81 mg/dL (ref 0.44–1.00)
GFR calc Af Amer: >60 mL/min (ref >60)
GFR calc non Af Amer: >60 mL/min (ref >60)
Glucose, Bld: 167 mg/dL — ABNORMAL HIGH (ref 70–99)
Potassium: 4 mmol/L (ref 3.5–5.1)
Sodium: 135 mmol/L (ref 135–145)
Total Bilirubin: 1.2 mg/dL (ref 0.3–1.2)
Total Protein: 7 g/dL (ref 6.5–8.1)

## 2018-07-12 LAB — CBC WITH DIFFERENTIAL/PLATELET
Abs Immature Granulocytes: 0.01 10*3/uL (ref 0.00–0.07)
Basophils Absolute: 0 10*3/uL (ref 0.0–0.1)
Basophils Relative: 1 %
Eosinophils Absolute: 0.2 10*3/uL (ref 0.0–0.5)
Eosinophils Relative: 5 %
HCT: 40.1 % (ref 36.0–46.0)
Hemoglobin: 13.7 g/dL (ref 12.0–15.0)
Immature Granulocytes: 0 %
Lymphocytes Relative: 18 %
Lymphs Abs: 0.7 10*3/uL (ref 0.7–4.0)
MCH: 32.2 pg (ref 26.0–34.0)
MCHC: 34.2 g/dL (ref 30.0–36.0)
MCV: 94.1 fL (ref 80.0–100.0)
Monocytes Absolute: 0.4 10*3/uL (ref 0.1–1.0)
Monocytes Relative: 10 %
Neutro Abs: 2.5 10*3/uL (ref 1.7–7.7)
Neutrophils Relative %: 66 %
Platelets: 148 10*3/uL — ABNORMAL LOW (ref 150–400)
RBC: 4.26 MIL/uL (ref 3.87–5.11)
RDW: 12.9 % (ref 11.5–15.5)
WBC: 3.9 10*3/uL — ABNORMAL LOW (ref 4.0–10.5)
nRBC: 0 % (ref 0.0–0.2)

## 2018-07-12 LAB — MAGNESIUM: Magnesium: 1.9 mg/dL (ref 1.7–2.4)

## 2018-07-12 NOTE — Progress Notes (Signed)
Pt here for follow up. Denies any concerns at this time.  

## 2018-07-16 ENCOUNTER — Other Ambulatory Visit: Payer: Self-pay

## 2018-07-17 NOTE — Progress Notes (Signed)
[facility] [address], [place], [place] [address] Phone: [phone]  Fax: [phone]   Clinic day:  07/19/2018    Chief Complaint: Stephanie Sanders is a 73 y.o. 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  HPI:  The patient was last seen in the medical oncology clinic on 07/12/2018.  At that time, she was doing well overall.  She denied any significant treatment-related side effects.  No mouth sores or hand tenderness.  She noted a single episode of loose stools.  No B symptoms or interval infections.  Exam was grossly unremarkable.  WBC was 3900 ([place] 2500).  Platelets 148,000.  During the interim, she has done well.  She has had some minor diarrhea.  She describes diarrhea last Tuesday that resolved with Imodium x 1.  She felt great on Friday, 07/16/2018.  She had no bowel movement until Saturday.  She had diarrhea again and took Imodium.  She had no bowel movement again until this morning.  She has had 2 bowel movements.  One was loose and the other had a little water.  She states that she feels "yucky" when she takes Imodium.   She denies any mouth sores or tenderness.  She denies any hand foot syndrome.   Past Medical History:  Diagnosis Date   Cancer ([place]) 04/2017   right breast   Diabetes mellitus without complication (HCC)    Diabetic nephropathy (HCC)    Headache    migraines   Heart murmur    ASYMPTOMATIC   Hypertension    Hypothyroidism    Personal history of chemotherapy 2019   right breast cancer   Pneumonia    Seborrheic keratosis    Vitamin D deficiency     Past Surgical History:  Procedure Laterality Date   APPENDECTOMY     AXILLARY LYMPH NODE DISSECTION Right 02/01/2018   Procedure: AXILLARY LYMPH NODE DISSECTION;  Surgeon: Herbert Pun, MD;  Location: [facility] ORS;  Service: General;  Laterality: Right;   BREAST BIOPSY  Right 05/28/2017   right breast bx 3 areas invasive mamm ca lymph node mets   BREAST CYST ASPIRATION Bilateral    neg   BREAST LUMPECTOMY Right 02/01/2018   CHOLECYSTECTOMY     COLONOSCOPY WITH PROPOFOL N/A 01/01/2015   Procedure: COLONOSCOPY WITH PROPOFOL;  Surgeon: Josefine Class, MD;  Location[facility];  Service: Endoscopy;  Laterality: N/A;   EYE SURGERY Left    eyelid   PARTIAL MASTECTOMY WITH NEEDLE LOCALIZATION Right 02/01/2018   Procedure: PARTIAL MASTECTOMY WITH NEEDLE LOCALIZATION;  Surgeon: Herbert Pun, MD;  Location: [facility] ORS;  Service: General;  Laterality: Right;   PORTACATH PLACEMENT Right 06/10/2017   Procedure: INSERTION PORT-A-CATH;  Surgeon: Herbert Pun, MD;  Location: [facility] OR[facility];  Service: General;  Laterality: Right;   SENTINEL NODE BIOPSY Right 02/01/2018   Procedure: SENTINEL NODE BIOPSY;  Surgeon: Herbert Pun, MD;  Location: [facility] ORS;  Service: General;  Laterality: Right;   STENT PLACEMENT ILIAC ([place] HX)     temporary placement s/p cholecystectomy   TUBAL LIGATION      Family History  Problem Relation Age of Onset   Parkinson's disease Mother    Cancer Maternal Aunt    Cancer Father    Lung cancer Father    Breast cancer Neg Hx     Social History:  reports that she quit smoking about 46 years ago. Her  smoking use included cigarettes. She has a 20.00 pack-year smoking history. She has never used smokeless tobacco. She reports that she does not drink alcohol or use drugs.  She has 3 children (daughters: age 72 and 81; son Jaquelyn Bitter): age 5).  She is retired.  She worked for the [employer].  Her husband has dementia.  She lives in [place].  She is alone today.  Allergies:  Allergies  Allergen Reactions   Fiorinal [Butalbital-Aspirin-Caffeine] Nausea And Vomiting and Other (See Comments)    SEVERE HEADACHE   Sulfa Antibiotics Other (See Comments)    Burning esophagus and stomach   Tramadol Nausea And  Vomiting   Other Rash    STERI-STRIPS-RASH    Current Medications: Current Outpatient Medications  Medication Sig Dispense Refill   atorvastatin (LIPITOR) 10 MG tablet Take 10 mg by mouth every evening.      capecitabine (XELODA) 150 MG tablet Take 2 tablets (300 mg total) by mouth 2 (two) times daily after a meal. Take with three 500mg  tablets. Take for 14days, then hold for 7days 56 tablet 0   capecitabine (XELODA) 500 MG tablet Take 3 tablets (1,500 mg total) by mouth 2 (two) times daily after a meal. Take with two 150mg  tablets. Take for 14days, then hold for 7days 84 tablet 0   Cholecalciferol 2000 UNITS CAPS Take 4,000 Units by mouth daily.      Lactobacillus (PROBIOTIC ACIDOPHILUS PO) Take by mouth daily.     levothyroxine (SYNTHROID, LEVOTHROID) 50 MCG tablet Take 50 mcg by mouth daily before breakfast.     MAGNESIUM GLYCINATE PLUS PO Take 1 tablet by mouth at bedtime.      Melatonin 5 MG TABS Take 5 mg by mouth daily as needed (for sleep.).      metFORMIN (GLUCOPHAGE) 500 MG tablet 1,000 mg 2 (two) times daily with a meal.      quinapril (ACCUPRIL) 5 MG tablet Take 5 mg by mouth every morning.      sodium bicarbonate 650 MG tablet Take 1,300 mg by mouth at bedtime.     acetaminophen (TYLENOL) 500 MG tablet Take 1,000 mg by mouth every 6 (six) hours as needed for moderate pain or headache.      lidocaine-prilocaine (EMLA) cream Apply 1 application topically as needed. (Patient not taking: Reported on 07/19/2018) 30 g 6   No current facility-administered medications for this visit.    Facility-Administered Medications Ordered in Other Visits  Medication Dose Route Frequency Provider Last Rate Last Dose   sodium chloride flush (NS) 0.9 % injection 10 mL  10 mL Intravenous PRN Lequita Asal, MD   10 mL at 10/21/17 0841    Review of Systems  Constitutional: Positive for weight loss (1 pound). Negative for chills, diaphoresis, fever and malaise/fatigue.        Feels "fine".  HENT: Negative.  Negative for congestion, ear discharge, ear pain, nosebleeds, sinus pain, sore throat and tinnitus.        Slight pepper sensation on tongue.  Eyes: Negative.  Negative for blurred vision, double vision, pain and discharge.  Respiratory: Negative.  Negative for cough, hemoptysis, sputum production and shortness of breath.   Cardiovascular: Negative.  Negative for chest pain, palpitations, orthopnea, leg swelling and PND.  Gastrointestinal: Positive for diarrhea (slight well controlled with Imodium). Negative for abdominal pain, blood in stool, constipation, heartburn, melena and vomiting.  Genitourinary: Negative.  Negative for dysuria, frequency, hematuria and urgency.  Musculoskeletal: Negative.  Negative for back pain, falls,  joint pain, myalgias and neck pain.  Skin: Negative.  Negative for itching and rash.  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 Negative for dizziness, tingling, tremors, focal weakness, weakness and headaches.  Endo/Heme/Allergies: Does not bruise/bleed easily.       Diabetes.  Psychiatric/Behavioral: Negative.  Negative for depression and memory loss. The patient is not nervous/anxious and does not have insomnia.   All other systems reviewed and are negative.  Performance status (ECOG): 1  Vital signs BP 116/64 (BP Location: Left Arm, Patient Position: Sitting)    Pulse 84    Temp 98.4 F (36.9 C) (Tympanic)    Resp 18    Ht 5\' 8"  (1.727 m)    Wt 145 lb 15.1 oz (66.2 kg)    SpO2 100%    BMI 22.19 kg/m   Physical Exam  Constitutional: She is oriented to person, place, and time and well-developed, well-nourished, and in no distress. No distress.  HENT:  Head: Normocephalic and atraumatic.  Mouth/Throat: Oropharynx is clear and moist and mucous membranes are normal. No oropharyngeal exudate.  Short gray hair. No mouth sores.  Eyes: Pupils are equal, round, and reactive to light.  Conjunctivae and EOM are normal. No scleral icterus.  Glasses.  Blue eyes.  Neck: Normal range of motion. Neck supple. No JVD present.  Cardiovascular: Normal rate, regular rhythm, normal heart sounds and intact distal pulses. Exam reveals no gallop and no friction rub.  No murmur heard. Pulmonary/Chest: Effort normal and breath sounds normal. No respiratory distress. She has no wheezes. She has no rales.  Abdominal: Soft. Bowel sounds are normal. She exhibits no distension and no mass. There is no abdominal tenderness. There is no rebound and no guarding.  Musculoskeletal: Normal range of motion.        General: No tenderness or edema.  Lymphadenopathy:    She has no cervical adenopathy.    She has no axillary adenopathy.       Right: No supraclavicular adenopathy present.       Left: No supraclavicular adenopathy present.  Neurological: She is alert and oriented to person, place, and time. Gait normal.  Skin: Skin is warm and dry. No rash noted. She is not diaphoretic. No erythema. No pallor.  No palmar erythema.  Psychiatric: Mood, affect and judgment normal.  Nursing note and vitals reviewed.   Appointment on 07/19/2018  Component Date Value Ref Range Status   Sodium 07/19/2018 137  135 - 145 mmol/L Final   Potassium 07/19/2018 3.6  3.5 - 5.1 mmol/L Final   Chloride 07/19/2018 102  98 - 111 mmol/L Final   CO2 07/19/2018 26  22 - 32 mmol/L Final   Glucose, Bld 07/19/2018 191* 70 - 99 mg/dL Final   BUN 07/19/2018 13  8 - 23 mg/dL Final   Creatinine, Ser 07/19/2018 0.80  0.44 - 1.00 mg/dL Final   Calcium 07/19/2018 9.4  8.9 - 10.3 mg/dL Final   Total Protein 07/19/2018 7.0  6.5 - 8.1 g/dL Final   Albumin 07/19/2018 4.4  3.5 - 5.0 g/dL Final   AST 07/19/2018 22  15 - 41 U/L Final   ALT 07/19/2018 20  0 - 44 U/L Final   Alkaline Phosphatase 07/19/2018 51  38 - 126 U/L Final   Total Bilirubin 07/19/2018 1.2  0.3 - 1.2 mg/dL Final   GFR calc non Af Amer 07/19/2018 >60   >60 mL/min Final   GFR calc Af Amer 07/19/2018 >  60  >60 mL/min Final   Anion gap 07/19/2018 9  5 - 15 Final   Performed at [facility] [address] [place], [place] [address]   WBC 07/19/2018 3.4* 4.0 - 10.5 K/uL Final   RBC 07/19/2018 4.11  3.87 - 5.11 MIL/uL Final   Hemoglobin 07/19/2018 13.4  12.0 - 15.0 g/dL Final   HCT 07/19/2018 38.3  36.0 - 46.0 % Final   MCV 07/19/2018 93.2  80.0 - 100.0 fL Final   MCH 07/19/2018 32.6  26.0 - 34.0 pg Final   MCHC 07/19/2018 35.0  30.0 - 36.0 g/dL Final   RDW 07/19/2018 12.7  11.5 - 15.5 % Final   Platelets 07/19/2018 147* 150 - 400 K/uL Final   nRBC 07/19/2018 0.0  0.0 - 0.2 % Final   Neutrophils Relative % 07/19/2018 62  % Final   Neutro Abs 07/19/2018 2.1  1.7 - 7.7 K/uL Final   Lymphocytes Relative 07/19/2018 21  % Final   Lymphs Abs 07/19/2018 0.7  0.7 - 4.0 K/uL Final   Monocytes Relative 07/19/2018 10  % Final   Monocytes Absolute 07/19/2018 0.3  0.1 - 1.0 K/uL Final   Eosinophils Relative 07/19/2018 6  % Final   Eosinophils Absolute 07/19/2018 0.2  0.0 - 0.5 K/uL Final   Basophils Relative 07/19/2018 1  % Final   Basophils Absolute 07/19/2018 0.0  0.0 - 0.1 K/uL Final   Immature Granulocytes 07/19/2018 0  % Final   Abs Immature Granulocytes 07/19/2018 0.01  0.00 - 0.07 K/uL Final   Performed at [facility], [address] [place], [place] [address]    Assessment:  Stephanie Sanders is a 73 y.o. 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  Pathology revealed two grade II invasive mammary carcinomas of no special type (4 cm from the nipple and 5 cm from the nipple).  Tumors were in close proximity.  Lymph node biopsy confirmed metastatic carcinoma of breast origin.  Tumor is ER negative (< 1%), PR negative and Her 2/neu 2+ by IHC.  [employer] genetic testing on 07/21/2017 revealed a variant of uncertain significance in  RECQL4.  Right sided mammogram and ultrasound on 05/21/2017 revealed 2 highly suspicious masses in the right breast at the 10:30 position 4 cm from nipple (1.8 x 1.2 x 1.1 cm) and at the 10:30 position 5 cm from nipple (1.1 x 0.9 x 0.9 cm).  There was a suspicious 2.7 x 1 x 2.2 cm lymph node in the right axilla with asymmetric nodular cortical thickening.  Ultrasound guided biopsy of the 2 masses and lymph node on 05/28/2017 revealed grade II invasive mammary carcinoma of no special type (4 cm from the nipple and 5 cm from the nipple).  Lymph node biopsy revealed metastatic carcinoma of breast origin.  Tumor is ER negative (< 1%), PR negative and Her 2/neu 2+ by IHC.  FISH was negative.    CA 27.29 has been followed: 16.5 on 06/04/2017 and 14.3 on 05/21/2018.  CA15-3 was 17.6 on 06/04/2017.  Echo on 06/09/2017 revealed an EF of 60-65%.  She has enrolled on the UPBEAT clinical trial.  She received 4 cycles of AC (06/23/2017 - 09/08/2017) with Udencya support.  Cycle #1 was complicated by influenza A.  Nadir counts included an ANC of 0 and platelet count of 71,000. Cycle #2 postponed due to slow recovery from influenza and excisional cyst removal with surrounding cellulitis (treated  with 10 days of doxycycline).   She received 12 weeks of Taxol (09/29/2017 - 01/05/2018).  She received GCSF after week #3 and 5 Taxol secondary to borderline counts. Cycle #5 was postponed on 10/29/2017 due to patient's wishes to be treated on Tuesdays only. Week #7 Taxol was held secondary to neutropenia (ANC 200).  Right mammogram and ultrasound on 09/28/2017 revealed the 2 adjacent masses in the right breast at the 10:30 o'clock position had both decreased in size (1.8 x 1.1 x 1.2 cm to 1.2 x 0.7 x 1.1 cm; 11 x 9 x 9 mm to 7 x 6 x 6 mm) as well as the metastatic right axillary lymph node (2.7 cm to 1.6 x 0.9 x 1.7 cm).    Right partial mastectomy and sentinel lymph node biopsy on 02/01/2018 Pathology revealed 10 mm  grade II invasive carcinoma of no special type.  The tumor bed encompassed the biopsy sites, with foci of residual  carcinoma at both biopsy sites and also involving the tissue between the  biopsy sites. Foci of residual carcinoma range from 1 to 10 mm.There was no DCIS.  Distance from closest margin was 1 mm.  Four of 4 lymph nodes were negative.  Pathologic stage was ypT1b (m) ypN0 (sn).  She received breast radiation from 03/10/2018 - 05/07/2018.  She is day 14 of cycle #1 Xeloda (began 07/06/2018).  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  Normal labs on 11/24/2017 included:  B12, folate, TSH, and copper.  Bone density on 07/01/2018 was normal with a T score of -0.8 and the RIGHT femoral neck.  She was admitted to [facility] from 06/28/2017 - 07/01/2017 with with fever, cough and generalized weakness.  Nasal swab was + for influenza A.  She completed a course of Tamiflu.  Symptomatically, she is doing well.  She has had intermittent diarrhea well controlled with minimal Imodium.  Exam is stable.  Plan: 1. Labs today: CBC with diff, CMP. 2. Multifocal triple negative RIGHT breast cancer Doing well on day 14 of cycle #1 Xeloda. Diarrhea has been minimal and well managed. No mouth sores or hand foot syndrome. Counts have been adequate. Discuss plans for follow-up in 1 week prior to cycle #2. Plan is 8 cycles of adjuvant Xeloda. Discuss symptom management.  She has antiemetics and pain medications at home to use on a prn bases.  Interventions are adequate.    3. Diarrhea  Minimal.  Continue use of Imodium as needed.  Contact the office if any concerns. 4.   RTC in 1 week for MD assessment, labs (CBC with diff, CMP, Mg), and cycle #2 Xeloda.   Lequita Asal, MD, PhD 07/19/2018 , 8:48 AM

## 2018-07-19 ENCOUNTER — Encounter: Payer: Self-pay | Admitting: Hematology and Oncology

## 2018-07-19 ENCOUNTER — Other Ambulatory Visit: Payer: Self-pay

## 2018-07-19 ENCOUNTER — Inpatient Hospital Stay: Payer: Medicare Other

## 2018-07-19 ENCOUNTER — Inpatient Hospital Stay (HOSPITAL_BASED_OUTPATIENT_CLINIC_OR_DEPARTMENT_OTHER): Payer: Medicare Other | Admitting: Hematology and Oncology

## 2018-07-19 VITALS — BP 116/64 | HR 84 | Temp 98.4°F | Resp 18 | Ht 68.0 in | Wt 145.9 lb

## 2018-07-19 DIAGNOSIS — C50911 Malignant neoplasm of unspecified site of right female breast: Secondary | ICD-10-CM

## 2018-07-19 DIAGNOSIS — Z171 Estrogen receptor negative status [ER-]: Secondary | ICD-10-CM

## 2018-07-19 DIAGNOSIS — R197 Diarrhea, unspecified: Secondary | ICD-10-CM | POA: Insufficient documentation

## 2018-07-19 DIAGNOSIS — T451X5A Adverse effect of antineoplastic and immunosuppressive drugs, initial encounter: Principal | ICD-10-CM

## 2018-07-19 DIAGNOSIS — K521 Toxic gastroenteritis and colitis: Secondary | ICD-10-CM

## 2018-07-19 DIAGNOSIS — R110 Nausea: Secondary | ICD-10-CM

## 2018-07-19 LAB — CBC WITH DIFFERENTIAL/PLATELET
Abs Immature Granulocytes: 0.01 10*3/uL (ref 0.00–0.07)
Basophils Absolute: 0 10*3/uL (ref 0.0–0.1)
Basophils Relative: 1 %
Eosinophils Absolute: 0.2 10*3/uL (ref 0.0–0.5)
Eosinophils Relative: 6 %
HCT: 38.3 % (ref 36.0–46.0)
Hemoglobin: 13.4 g/dL (ref 12.0–15.0)
Immature Granulocytes: 0 %
Lymphocytes Relative: 21 %
Lymphs Abs: 0.7 10*3/uL (ref 0.7–4.0)
MCH: 32.6 pg (ref 26.0–34.0)
MCHC: 35 g/dL (ref 30.0–36.0)
MCV: 93.2 fL (ref 80.0–100.0)
Monocytes Absolute: 0.3 10*3/uL (ref 0.1–1.0)
Monocytes Relative: 10 %
Neutro Abs: 2.1 10*3/uL (ref 1.7–7.7)
Neutrophils Relative %: 62 %
Platelets: 147 10*3/uL — ABNORMAL LOW (ref 150–400)
RBC: 4.11 MIL/uL (ref 3.87–5.11)
RDW: 12.7 % (ref 11.5–15.5)
WBC: 3.4 10*3/uL — ABNORMAL LOW (ref 4.0–10.5)
nRBC: 0 % (ref 0.0–0.2)

## 2018-07-19 LAB — COMPREHENSIVE METABOLIC PANEL
ALT: 20 U/L (ref 0–44)
AST: 22 U/L (ref 15–41)
Albumin: 4.4 g/dL (ref 3.5–5.0)
Alkaline Phosphatase: 51 U/L (ref 38–126)
Anion gap: 9 (ref 5–15)
BUN: 13 mg/dL (ref 8–23)
CO2: 26 mmol/L (ref 22–32)
Calcium: 9.4 mg/dL (ref 8.9–10.3)
Chloride: 102 mmol/L (ref 98–111)
Creatinine, Ser: 0.8 mg/dL (ref 0.44–1.00)
GFR calc Af Amer: >60 mL/min (ref >60)
GFR calc non Af Amer: >60 mL/min (ref >60)
Glucose, Bld: 191 mg/dL — ABNORMAL HIGH (ref 70–99)
Potassium: 3.6 mmol/L (ref 3.5–5.1)
Sodium: 137 mmol/L (ref 135–145)
Total Bilirubin: 1.2 mg/dL (ref 0.3–1.2)
Total Protein: 7 g/dL (ref 6.5–8.1)

## 2018-07-19 NOTE — Progress Notes (Signed)
No new changes noted today 

## 2018-07-20 ENCOUNTER — Other Ambulatory Visit: Payer: Self-pay | Admitting: Pharmacist

## 2018-07-20 DIAGNOSIS — T451X5A Adverse effect of antineoplastic and immunosuppressive drugs, initial encounter: Secondary | ICD-10-CM

## 2018-07-20 DIAGNOSIS — C50911 Malignant neoplasm of unspecified site of right female breast: Secondary | ICD-10-CM

## 2018-07-20 DIAGNOSIS — Z171 Estrogen receptor negative status [ER-]: Secondary | ICD-10-CM

## 2018-07-20 DIAGNOSIS — R110 Nausea: Secondary | ICD-10-CM

## 2018-07-20 MED ORDER — CAPECITABINE 500 MG PO TABS: 1500.0000 mg | ORAL_TABLET | Freq: Two times a day (BID) | ORAL | 1 refills | Status: DC

## 2018-07-20 MED ORDER — CAPECITABINE 500 MG PO TABS: 1500.0000 mg | ORAL_TABLET | Freq: Two times a day (BID) | ORAL | 0 refills | Status: DC

## 2018-07-20 MED ORDER — CAPECITABINE 150 MG PO TABS: 300.0000 mg | ORAL_TABLET | Freq: Two times a day (BID) | ORAL | 0 refills | Status: DC

## 2018-07-20 MED ORDER — CAPECITABINE 150 MG PO TABS: 300.0000 mg | ORAL_TABLET | Freq: Two times a day (BID) | ORAL | 1 refills | Status: DC

## 2018-07-20 NOTE — Progress Notes (Signed)
Oral Chemotherapy Pharmacist Encounter  Due to insurance restriction the medication can not be filled at [place] past the first fill. A refill prescription has been e-scribed to [facility].  Supportive information was faxed to [place]. We will follow this pharmacy transition.    Darl Pikes, PharmD, BCPS, [facility] Hematology/Oncology Clinical Pharmacist [facility] [phone]  07/20/2018 11:50 AM

## 2018-07-21 NOTE — Progress Notes (Signed)
Oral Chemotherapy Pharmacist Encounter   Called [facility] to check in the status of the Xeloda prescriptions. They stated that the medication is scheduled for delivery on 07/23/2018.  Darl Pikes, PharmD, BCPS, [facility] Hematology/Oncology Clinical Pharmacist [facility] [phone]  07/21/2018 1:40 PM

## 2018-07-25 NOTE — Progress Notes (Signed)
[facility] [address] [place], [place] [address] Phone: [phone]  Fax: [phone]   Clinic day:  07/26/2018    Chief Complaint: Stephanie Sanders is a 73 y.o. 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  HPI:  The patient was last seen in the medical oncology clinic on 07/19/2018.  At that time, she was doing well.  She had some minor diarrhea.  She felt that Imodium made her feel "yucky".  Labs included a  hematocrit of 38.3, hemoglobin 13.4, MCV 93.2, platelets 147,000, WBC 3400 with an ANC of 2100.  She completed cycle #1 Xeloda on 07/19/2018.  During the interim, she has done well.  She is currently off Xeloda.  She notes diarrhea on and off x 2 weeks.  She notes loose stools every other day.  She has not used Imodium.  She describes cramps off and on.  She is taking magnesium glycinate at night.  Left foot is tender.   Past Medical History:  Diagnosis Date   Cancer ([place]) 04/2017   right breast   Diabetes mellitus without complication (HCC)    Diabetic nephropathy (HCC)    Headache    migraines   Heart murmur    ASYMPTOMATIC   Hypertension    Hypothyroidism    Personal history of chemotherapy 2019   right breast cancer   Pneumonia    Seborrheic keratosis    Vitamin D deficiency     Past Surgical History:  Procedure Laterality Date   APPENDECTOMY     AXILLARY LYMPH NODE DISSECTION Right 02/01/2018   Procedure: AXILLARY LYMPH NODE DISSECTION;  Surgeon: Herbert Pun, MD;  Location: [facility] ORS;  Service: General;  Laterality: Right;   BREAST BIOPSY Right 05/28/2017   right breast bx 3 areas invasive mamm ca lymph node mets   BREAST CYST ASPIRATION Bilateral    neg   BREAST LUMPECTOMY Right 02/01/2018   CHOLECYSTECTOMY     COLONOSCOPY WITH PROPOFOL N/A 01/01/2015   Procedure: COLONOSCOPY WITH PROPOFOL;  Surgeon: Josefine Class, MD;  Location: [facility] ENDOSCOPY;  Service: Endoscopy;  Laterality: N/A;   EYE SURGERY Left    eyelid   PARTIAL MASTECTOMY WITH NEEDLE LOCALIZATION Right 02/01/2018   Procedure: PARTIAL MASTECTOMY WITH NEEDLE LOCALIZATION;  Surgeon: Herbert Pun, MD;  Location: [facility] ORS;  Service: General;  Laterality: Right;   PORTACATH PLACEMENT Right 06/10/2017   Procedure: INSERTION PORT-A-CATH;  Surgeon: Herbert Pun, MD;  Location: [facility] ORS;  Service: General;  Laterality: Right;   SENTINEL NODE BIOPSY Right 02/01/2018   Procedure: SENTINEL NODE BIOPSY;  Surgeon: Herbert Pun, MD;  Location: [facility] ORS;  Service: General;  Laterality: Right;   STENT PLACEMENT ILIAC ([place] HX)     temporary placement s/p cholecystectomy   TUBAL LIGATION      Family History  Problem Relation Age of Onset   Parkinson's disease Mother    Cancer Maternal Aunt    Cancer Father    Lung cancer Father    Breast cancer Neg Hx     Social History:  reports that she quit smoking about 46 years ago. Her smoking use included cigarettes. She has a 20.00 pack-year smoking history. She has never used smokeless tobacco. She reports that she does not drink alcohol or use drugs.  She has 3 children (daughters: age 38 and 39; son Jaquelyn Bitter): age 94).  She is retired.  She worked  for the [employer].  Her husband has dementia.  She lives in [place].  She is alone today.  Allergies:  Allergies  Allergen Reactions   Fiorinal [Butalbital-Aspirin-Caffeine] Nausea And Vomiting and Other (See Comments)    SEVERE HEADACHE   Sulfa Antibiotics Other (See Comments)    Burning esophagus and stomach   Tramadol Nausea And Vomiting   Other Rash    STERI-STRIPS-RASH    Current Medications: Current Outpatient Medications  Medication Sig Dispense Refill   atorvastatin (LIPITOR) 10 MG tablet Take 10 mg by mouth every evening.      capecitabine (XELODA) 150 MG tablet Take 2 tablets (300 mg total) by  mouth 2 (two) times daily after a meal. Take with three 500mg  tablets. Take for 14days, then hold for 7days 56 tablet 1   capecitabine (XELODA) 500 MG tablet Take 3 tablets (1,500 mg total) by mouth 2 (two) times daily after a meal. Take with two 150mg  tablets. Take for 14days, then hold for 7days 84 tablet 1   Cholecalciferol 2000 UNITS CAPS Take 4,000 Units by mouth daily.      Lactobacillus (PROBIOTIC ACIDOPHILUS PO) Take by mouth daily.     levothyroxine (SYNTHROID, LEVOTHROID) 50 MCG tablet Take 50 mcg by mouth daily before breakfast.     MAGNESIUM GLYCINATE PLUS PO Take 1 tablet by mouth at bedtime.      Melatonin 5 MG TABS Take 5 mg by mouth daily as needed (for sleep.).      metFORMIN (GLUCOPHAGE) 500 MG tablet 1,000 mg 2 (two) times daily with a meal.      quinapril (ACCUPRIL) 5 MG tablet Take 5 mg by mouth every morning.      sodium bicarbonate 650 MG tablet Take 1,300 mg by mouth at bedtime.     acetaminophen (TYLENOL) 500 MG tablet Take 1,000 mg by mouth every 6 (six) hours as needed for moderate pain or headache.      lidocaine-prilocaine (EMLA) cream Apply 1 application topically as needed. (Patient not taking: Reported on 07/19/2018) 30 g 6   No current facility-administered medications for this visit.    Facility-Administered Medications Ordered in Other Visits  Medication Dose Route Frequency Provider Last Rate Last Dose   sodium chloride flush (NS) 0.9 % injection 10 mL  10 mL Intravenous PRN Lequita Asal, MD   10 mL at 10/21/17 0841    Review of Systems  Constitutional: Positive for weight loss (up 2 pounds). Negative for chills, diaphoresis, fever and malaise/fatigue.       Feels "ok".  HENT: Negative.  Negative for congestion, ear discharge, ear pain, nosebleeds, sinus pain, sore throat and tinnitus.   Eyes: Negative.  Negative for blurred vision, double vision, photophobia, pain and discharge.  Respiratory: Negative.  Negative for cough, hemoptysis,  sputum production and shortness of breath.   Cardiovascular: Negative for chest pain, palpitations, orthopnea, leg swelling and PND.  Gastrointestinal: Positive for diarrhea (every other day; not using Imodium). Negative for abdominal pain, blood in stool, constipation, heartburn, melena, nausea and vomiting.  Genitourinary: Negative.  Negative for dysuria, frequency, hematuria and urgency.  Musculoskeletal: Negative for back pain, falls, joint pain, myalgias and neck pain.       Left foot tender.  Skin: Negative.  Negative for itching and rash.  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 Negative for dizziness, tingling, tremors, speech change, focal weakness, weakness and headaches.  Endo/Heme/Allergies: Negative.  Does not bruise/bleed easily.  Diabetes.  Psychiatric/Behavioral: Negative.  Negative for depression and memory loss. The patient is not nervous/anxious and does not have insomnia.   All other systems reviewed and are negative.  Performance status (ECOG): 1 - Symptomatic but completely ambulatory  Vital signs BP (!) 125/52 (BP Location: Left Arm, Patient Position: Sitting)    Pulse 75    Temp 98.7 F (37.1 C)    Resp 18    Ht 5\' 8"  (1.727 m)    Wt 147 lb 11.3 oz (67 kg)    SpO2 100%    BMI 22.46 kg/m   Physical Exam  Constitutional: She is oriented to person, place, and time and well-developed, well-nourished, and in no distress. No distress.  HENT:  Head: Normocephalic and atraumatic.  Mouth/Throat: Mucous membranes are normal. No oropharyngeal exudate.  Short gray hair. No mouth sores.  Eyes: Pupils are equal, round, and reactive to light. Conjunctivae and EOM are normal. No scleral icterus.  Glasses.  Blue eyes.  Neck: Normal range of motion. Neck supple. No JVD present.  Cardiovascular: Normal rate, regular rhythm, normal heart sounds and intact distal pulses. Exam reveals no gallop and no friction rub.  No murmur  heard. Pulmonary/Chest: Effort normal and breath sounds normal. No respiratory distress. She has no wheezes. She has no rales.  Abdominal: Soft. Bowel sounds are normal. She exhibits no distension and no mass. There is no abdominal tenderness. There is no rebound and no guarding.  Musculoskeletal: Normal range of motion.        General: No tenderness or edema.  Lymphadenopathy:    She has no cervical adenopathy.    She has no axillary adenopathy.       Right: No supraclavicular adenopathy present.       Left: No supraclavicular adenopathy present.  Neurological: She is alert and oriented to person, place, and time. Gait normal.  Skin: Skin is warm and dry. No rash noted. She is not diaphoretic. No erythema. No pallor.  No palmar or plantar erythema.  Psychiatric: Mood, memory, affect and judgment normal.  Nursing note and vitals reviewed.   Infusion on 07/26/2018  Component Date Value Ref Range Status   WBC 07/26/2018 3.3* 4.0 - 10.5 K/uL Final   RBC 07/26/2018 3.80* 3.87 - 5.11 MIL/uL Final   Hemoglobin 07/26/2018 12.5  12.0 - 15.0 g/dL Final   HCT 07/26/2018 35.6* 36.0 - 46.0 % Final   MCV 07/26/2018 93.7  80.0 - 100.0 fL Final   MCH 07/26/2018 32.9  26.0 - 34.0 pg Final   MCHC 07/26/2018 35.1  30.0 - 36.0 g/dL Final   RDW 07/26/2018 14.7  11.5 - 15.5 % Final   Platelets 07/26/2018 140* 150 - 400 K/uL Final   nRBC 07/26/2018 0.0  0.0 - 0.2 % Final   Neutrophils Relative % 07/26/2018 56  % Final   Neutro Abs 07/26/2018 1.9  1.7 - 7.7 K/uL Final   Lymphocytes Relative 07/26/2018 23  % Final   Lymphs Abs 07/26/2018 0.8  0.7 - 4.0 K/uL Final   Monocytes Relative 07/26/2018 15  % Final   Monocytes Absolute 07/26/2018 0.5  0.1 - 1.0 K/uL Final   Eosinophils Relative 07/26/2018 5  % Final   Eosinophils Absolute 07/26/2018 0.2  0.0 - 0.5 K/uL Final   Basophils Relative 07/26/2018 1  % Final   Basophils Absolute 07/26/2018 0.0  0.0 - 0.1 K/uL Final   Immature  Granulocytes 07/26/2018 0  % Final   Abs Immature Granulocytes 07/26/2018  0.01  0.00 - 0.07 K/uL Final   Performed at [facility], [address], [place], [place] [address]    Assessment:  MISHELL DONALSON is a 73 y.o. 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  Pathology revealed two grade II invasive mammary carcinomas of no special type (4 cm from the nipple and 5 cm from the nipple).  Tumors were in close proximity.  Lymph node biopsy confirmed metastatic carcinoma of breast origin.  Tumor is ER negative (< 1%), PR negative and Her 2/neu 2+ by IHC.  [facility] genetic testing on 07/21/2017 revealed a variant of uncertain significance in RECQL4.  Right sided mammogram and ultrasound on 05/21/2017 revealed 2 highly suspicious masses in the right breast at the 10:30 position 4 cm from nipple (1.8 x 1.2 x 1.1 cm) and at the 10:30 position 5 cm from nipple (1.1 x 0.9 x 0.9 cm).  There was a suspicious 2.7 x 1 x 2.2 cm lymph node in the right axilla with asymmetric nodular cortical thickening.  Ultrasound guided biopsy of the 2 masses and lymph node on 05/28/2017 revealed grade II invasive mammary carcinoma of no special type (4 cm from the nipple and 5 cm from the nipple).  Lymph node biopsy revealed metastatic carcinoma of breast origin.  Tumor is ER negative (< 1%), PR negative and Her 2/neu 2+ by IHC.  FISH was negative.    CA 27.29 has been followed: 16.5 on 06/04/2017 and 14.3 on 05/21/2018.  CA15-3 was 17.6 on 06/04/2017.  Echo on 06/09/2017 revealed an EF of 60-65%.  She has enrolled on the UPBEAT clinical trial.  She received 4 cycles of AC (06/23/2017 - 09/08/2017) with Udencya support.  Cycle #1 was complicated by influenza A.  Nadir counts included an ANC of 0 and platelet count of 71,000. Cycle #2 postponed due to slow recovery from influenza and excisional cyst removal with surrounding cellulitis (treated with 10  days of doxycycline).   She received 12 weeks of Taxol (09/29/2017 - 01/05/2018).  She received GCSF after week #3 and 5 Taxol secondary to borderline counts. Cycle #5 was postponed on 10/29/2017 due to patient's wishes to be treated on Tuesdays only. Week #7 Taxol was held secondary to neutropenia (ANC 200).  Right mammogram and ultrasound on 09/28/2017 revealed the 2 adjacent masses in the right breast at the 10:30 o'clock position had both decreased in size (1.8 x 1.1 x 1.2 cm to 1.2 x 0.7 x 1.1 cm; 11 x 9 x 9 mm to 7 x 6 x 6 mm) as well as the metastatic right axillary lymph node (2.7 cm to 1.6 x 0.9 x 1.7 cm).    Right partial mastectomy and sentinel lymph node biopsy on 02/01/2018 Pathology revealed 10 mm grade II invasive carcinoma of no special type.  The tumor bed encompassed the biopsy sites, with foci of residual  carcinoma at both biopsy sites and also involving the tissue between the  biopsy sites. Foci of residual carcinoma range from 1 to 10 mm.There was no DCIS.  Distance from closest margin was 1 mm.  Four of 4 lymph nodes were negative.  Pathologic stage was ypT1b (m) ypN0 (sn).  She received breast radiation from 03/10/2018 - 05/07/2018.  She is day 21 of cycle #1 Xeloda (began 07/06/2018).  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  Normal labs on 11/24/2017 included:  B12, folate, TSH, and copper.  Bone  density on 07/01/2018 was normal with a T score of -0.8 and the RIGHT femoral neck.  She was admitted to [facility] from 06/28/2017 - 07/01/2017 with with fever, cough and generalized weakness.  Nasal swab was + for influenza A.  She completed a course of Tamiflu.  Symptomatically, she has loose stools every other day.  She is not using Imodium.  Left foot is tender.  Exam reveals no palmar or plantar erythema or skin changes. WBC is 3300 ([place] 1900).  Platelet count is 140,000.  Plan: 1. Labs today: CBC with diff, CMP, Mg. 2. Multifocal triple negative RIGHT  breast cancer Clinically doing well. Tolerated cycle #1 Xeloda. Review potential side effects of Xeloda including diarrhea, mouth sores, hand foot syndrome, and low counts. Diarrhea appears well controlled.  Review use of Imodium. Discuss close monitoring of counts.  Patient to contact clinic if any fever.   Review plans for 8 cycles of adjuvant capecitabine (2 weeks on and one-week off). Begin cycle #2 Xeloda on 07/27/2018. Discuss plans for counts on day 10 and nurse phone follow-up with next cycle. 3. Diarrhea Etiology likely secondary to Xeloda. Discuss collecting stool sample for C diff and GI panel. Discuss contacting clinic if diarrhea worsens. 4.   Hypomagnesemia  Magnesium 2.1 (normal).  Patient on magnesium glycinate at night.  Consider holding oral magnesium and following levels if loose stools continue. 5.   RN to call patient on Friday, 07/30/2018, and ask about diarrhea and any other side effects. 6.   RTC in 10 days for labs (CBC with diff). 7.   RTC in 3 weeks for MD assessment, labs (CBC with diff, CMP, Mg), and cycle #3 Xeloda.  I discussed the assessment and treatment plan with the patient.  The patient was provided an opportunity to ask questions and all were answered.  The patient agreed with the plan and demonstrated an understanding of the instructions.  The patient was advised to call back or seek an in person evaluation if the symptoms worsen or if the condition fails to improve as anticipated.    Lequita Asal, MD  07/26/2018 , 10:53 AM

## 2018-07-26 ENCOUNTER — Other Ambulatory Visit: Payer: Medicare Other

## 2018-07-26 ENCOUNTER — Encounter: Payer: Self-pay | Admitting: Hematology and Oncology

## 2018-07-26 ENCOUNTER — Inpatient Hospital Stay: Payer: Medicare Other | Attending: Hematology and Oncology | Admitting: Hematology and Oncology

## 2018-07-26 ENCOUNTER — Inpatient Hospital Stay: Payer: Medicare Other

## 2018-07-26 ENCOUNTER — Other Ambulatory Visit: Payer: Self-pay

## 2018-07-26 VITALS — BP 125/52 | HR 75 | Temp 98.7°F | Resp 18 | Ht 68.0 in | Wt 147.7 lb

## 2018-07-26 DIAGNOSIS — Z171 Estrogen receptor negative status [ER-]: Secondary | ICD-10-CM | POA: Insufficient documentation

## 2018-07-26 DIAGNOSIS — Z801 Family history of malignant neoplasm of trachea, bronchus and lung: Secondary | ICD-10-CM | POA: Diagnosis not present

## 2018-07-26 DIAGNOSIS — R197 Diarrhea, unspecified: Secondary | ICD-10-CM | POA: Diagnosis not present

## 2018-07-26 DIAGNOSIS — Z809 Family history of malignant neoplasm, unspecified: Secondary | ICD-10-CM | POA: Diagnosis not present

## 2018-07-26 DIAGNOSIS — C50911 Malignant neoplasm of unspecified site of right female breast: Secondary | ICD-10-CM | POA: Insufficient documentation

## 2018-07-26 DIAGNOSIS — Z87891 Personal history of nicotine dependence: Secondary | ICD-10-CM | POA: Diagnosis not present

## 2018-07-26 DIAGNOSIS — D696 Thrombocytopenia, unspecified: Secondary | ICD-10-CM

## 2018-07-26 LAB — COMPREHENSIVE METABOLIC PANEL
ALT: 20 U/L (ref 0–44)
AST: 22 U/L (ref 15–41)
Albumin: 4.3 g/dL (ref 3.5–5.0)
Alkaline Phosphatase: 50 U/L (ref 38–126)
Anion gap: 9 (ref 5–15)
BUN: 13 mg/dL (ref 8–23)
CO2: 25 mmol/L (ref 22–32)
Calcium: 9.2 mg/dL (ref 8.9–10.3)
Chloride: 101 mmol/L (ref 98–111)
Creatinine, Ser: 0.76 mg/dL (ref 0.44–1.00)
GFR calc Af Amer: >60 mL/min (ref >60)
GFR calc non Af Amer: >60 mL/min (ref >60)
Glucose, Bld: 121 mg/dL — ABNORMAL HIGH (ref 70–99)
Potassium: 3.6 mmol/L (ref 3.5–5.1)
Sodium: 135 mmol/L (ref 135–145)
Total Bilirubin: 1.2 mg/dL (ref 0.3–1.2)
Total Protein: 6.7 g/dL (ref 6.5–8.1)

## 2018-07-26 LAB — CBC WITH DIFFERENTIAL/PLATELET
Abs Immature Granulocytes: 0.01 10*3/uL (ref 0.00–0.07)
Basophils Absolute: 0 10*3/uL (ref 0.0–0.1)
Basophils Relative: 1 %
Eosinophils Absolute: 0.2 10*3/uL (ref 0.0–0.5)
Eosinophils Relative: 5 %
HCT: 35.6 % — ABNORMAL LOW (ref 36.0–46.0)
Hemoglobin: 12.5 g/dL (ref 12.0–15.0)
Immature Granulocytes: 0 %
Lymphocytes Relative: 23 %
Lymphs Abs: 0.8 10*3/uL (ref 0.7–4.0)
MCH: 32.9 pg (ref 26.0–34.0)
MCHC: 35.1 g/dL (ref 30.0–36.0)
MCV: 93.7 fL (ref 80.0–100.0)
Monocytes Absolute: 0.5 10*3/uL (ref 0.1–1.0)
Monocytes Relative: 15 %
Neutro Abs: 1.9 10*3/uL (ref 1.7–7.7)
Neutrophils Relative %: 56 %
Platelets: 140 10*3/uL — ABNORMAL LOW (ref 150–400)
RBC: 3.8 MIL/uL — ABNORMAL LOW (ref 3.87–5.11)
RDW: 14.7 % (ref 11.5–15.5)
WBC: 3.3 10*3/uL — ABNORMAL LOW (ref 4.0–10.5)
nRBC: 0 % (ref 0.0–0.2)

## 2018-07-26 LAB — MAGNESIUM: Magnesium: 2.1 mg/dL (ref 1.7–2.4)

## 2018-07-26 MED ORDER — SODIUM CHLORIDE 0.9% FLUSH
10.0000 mL | INTRAVENOUS | Status: DC | PRN
  Administered 2018-07-26: 11:00:00 10 mL via INTRAVENOUS
  Filled 2018-07-26: qty 10

## 2018-07-26 MED ORDER — HEPARIN SOD (PORK) LOCK FLUSH 100 UNIT/ML IV SOLN
500.0000 [IU] | Freq: Once | INTRAVENOUS | Status: AC
  Administered 2018-07-26: 500 [IU] via INTRAVENOUS

## 2018-07-26 NOTE — Progress Notes (Signed)
No new changes noted today 

## 2018-07-27 ENCOUNTER — Other Ambulatory Visit: Payer: Self-pay

## 2018-07-27 ENCOUNTER — Telehealth: Payer: Self-pay

## 2018-07-27 DIAGNOSIS — C50911 Malignant neoplasm of unspecified site of right female breast: Secondary | ICD-10-CM | POA: Diagnosis not present

## 2018-07-27 DIAGNOSIS — R197 Diarrhea, unspecified: Secondary | ICD-10-CM

## 2018-07-27 LAB — GASTROINTESTINAL PANEL BY PCR, STOOL (REPLACES STOOL CULTURE)

## 2018-07-27 LAB — C DIFFICILE QUICK SCREEN W PCR REFLEX
C Diff antigen: NEGATIVE
C Diff interpretation: NOT DETECTED
C Diff toxin: NEGATIVE

## 2018-07-27 NOTE — Telephone Encounter (Signed)
Spoke with the patient to inform her that she was negative for stool studies. Stephanie Sanders was understanding and agreeable.

## 2018-07-27 NOTE — Telephone Encounter (Signed)
-----   Message from Lequita Asal, MD sent at 07/27/2018  2:53 PM EDT ----- Regarding: Please call patient  Stool studies were negative.  ----- Message ----- From: Interface, Lab In [place] Sent: 07/27/2018  11:56 AM EDT To: Lequita Asal, MD

## 2018-07-29 ENCOUNTER — Inpatient Hospital Stay: Payer: Medicare Other

## 2018-07-30 ENCOUNTER — Telehealth: Payer: Self-pay

## 2018-07-30 NOTE — Telephone Encounter (Signed)
Contacted patient to inquire on Diarrhea and other potential symptoms. Patient reports she Diarrhea comes and goes. Tuesday and Wednesday she had diarrhea, yesterday she did not, and today has had 3 episodes and then it stopped. Patient states she feels fine and is denying any other symptoms. Educated patient on [place] and increase fluid intake esp. Fluids with electrolytes. Advised patient to call back with any new or worsening symptoms. Patient verbalizes understanding and denies any questions or concerns.

## 2018-08-05 ENCOUNTER — Other Ambulatory Visit: Payer: Self-pay

## 2018-08-05 ENCOUNTER — Inpatient Hospital Stay: Payer: Medicare Other | Attending: Hematology and Oncology

## 2018-08-05 ENCOUNTER — Other Ambulatory Visit: Payer: Medicare Other

## 2018-08-05 DIAGNOSIS — C50911 Malignant neoplasm of unspecified site of right female breast: Secondary | ICD-10-CM

## 2018-08-05 DIAGNOSIS — D696 Thrombocytopenia, unspecified: Secondary | ICD-10-CM

## 2018-08-05 DIAGNOSIS — Z171 Estrogen receptor negative status [ER-]: Principal | ICD-10-CM

## 2018-08-05 LAB — CBC WITH DIFFERENTIAL/PLATELET
Abs Immature Granulocytes: 0 10*3/uL (ref 0.00–0.07)
Basophils Absolute: 0 10*3/uL (ref 0.0–0.1)
Basophils Relative: 1 %
Eosinophils Absolute: 0.1 10*3/uL (ref 0.0–0.5)
Eosinophils Relative: 3 %
HCT: 35.6 % — ABNORMAL LOW (ref 36.0–46.0)
Hemoglobin: 12.4 g/dL (ref 12.0–15.0)
Immature Granulocytes: 0 %
Lymphocytes Relative: 23 %
Lymphs Abs: 0.6 10*3/uL — ABNORMAL LOW (ref 0.7–4.0)
MCH: 32.9 pg (ref 26.0–34.0)
MCHC: 34.8 g/dL (ref 30.0–36.0)
MCV: 94.4 fL (ref 80.0–100.0)
Monocytes Absolute: 0.4 10*3/uL (ref 0.1–1.0)
Monocytes Relative: 13 %
Neutro Abs: 1.7 10*3/uL (ref 1.7–7.7)
Neutrophils Relative %: 60 %
Platelets: 117 10*3/uL — ABNORMAL LOW (ref 150–400)
RBC: 3.77 MIL/uL — ABNORMAL LOW (ref 3.87–5.11)
RDW: 15.3 % (ref 11.5–15.5)
WBC: 2.8 10*3/uL — ABNORMAL LOW (ref 4.0–10.5)
nRBC: 0 % (ref 0.0–0.2)

## 2018-08-09 ENCOUNTER — Telehealth: Payer: Self-pay

## 2018-08-09 NOTE — Telephone Encounter (Signed)
Contacted patient and informed her of lab results. Reviewed neutropenic precautions and educated on fever. Patient reports diarrhea continues to be off and on. States Imodium helps (after taking, diarrhea stops for 2 days and then starts back again). States tongue sensitivity is present and some small bumps are felt behind lips and on floor of mouth. Denies any pain. Reports hands and feet are fine. (States these are all symptoms Dr. Mike Gip was monitoring). Advised patient to call should she develop in symptoms.   Patient also made aware Xeloda will be sent to pharmacy but patient is not to take medication until speaking with Dr. Mike Gip at appt. 08/16/2018.

## 2018-08-15 ENCOUNTER — Other Ambulatory Visit: Payer: Self-pay

## 2018-08-15 NOTE — Progress Notes (Signed)
[facility] [address] [place], [place] [address] Phone: [phone]  Fax: [phone]   Clinic day:  08/16/2018    Chief Complaint: Stephanie Sanders is a 73 y.o. 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  HPI:  The patient was last seen in the medical oncology clinic on 07/26/2018.  At that time,  she had loose stools every other day.  She was not using Imodium.  Left foot was tender.  Exam revealed no palmar or plantar erythema or skin changes. WBC was 3300 ([place] 1900).  Platelet count was 140,000.  Stool for C. difficile and GI panel by PCR were negative.  She began cycle #2 Xeloda on 07/27/2018.  CBC on day 10 (08/05/2018) revealed a hematocrit of 35.6, hemoglobin 12.4, platelets 117,000, white count 2800 with an ANC of 1700.   During the interim, she describes feeling "about the same" ("pretty good").  She has slight nausea associated with Xeloda.  She describes feeling "little bumps" in her mouth with some increased sensitivity.  She comments that the last 2-3 days of Xeloda that her feet became tender without erythema or desquamatation.  She is using Bag Balm.  She notes diarrhea off and on.  She has chronic drainage from her breast seroma (slightly better).   Past Medical History:  Diagnosis Date  . Cancer ([place]) 04/2017   right breast  . Diabetes mellitus without complication ([place])   . Diabetic nephropathy ([place])   . Headache    migraines  . Heart murmur    ASYMPTOMATIC  . Hypertension   . Hypothyroidism   . Personal history of chemotherapy 2019   right breast cancer  . Pneumonia   . Seborrheic keratosis   . Vitamin D deficiency     Past Surgical History:  Procedure Laterality Date  . APPENDECTOMY    . AXILLARY LYMPH NODE DISSECTION Right 02/01/2018   Procedure: AXILLARY LYMPH NODE DISSECTION;  Surgeon: Herbert Pun, MD;  Location: [facility] ORS;   Service: General;  Laterality: Right;  . BREAST BIOPSY Right 05/28/2017   right breast bx 3 areas invasive mamm ca lymph node mets  . BREAST CYST ASPIRATION Bilateral    neg  . BREAST LUMPECTOMY Right 02/01/2018  . CHOLECYSTECTOMY    . COLONOSCOPY WITH PROPOFOL N/A 01/01/2015   Procedure: COLONOSCOPY WITH PROPOFOL;  Surgeon: Josefine Class, MD;  Location: [facility] ENDOSCOPY;  Service: Endoscopy;  Laterality: N/A;  . EYE SURGERY Left    eyelid  . PARTIAL MASTECTOMY WITH NEEDLE LOCALIZATION Right 02/01/2018   Procedure: PARTIAL MASTECTOMY WITH NEEDLE LOCALIZATION;  Surgeon: Herbert Pun, MD;  Location: [facility] ORS;  Service: General;  Laterality: Right;  . PORTACATH PLACEMENT Right 06/10/2017   Procedure: INSERTION PORT-A-CATH;  Surgeon: Herbert Pun, MD;  Location: [facility] ORS;  Service: General;  Laterality: Right;  . SENTINEL NODE BIOPSY Right 02/01/2018   Procedure: SENTINEL NODE BIOPSY;  Surgeon: Herbert Pun, MD;  Location: [facility] ORS;  Service: General;  Laterality: Right;  . STENT PLACEMENT ILIAC ([place] HX)     temporary placement s/p cholecystectomy  . TUBAL LIGATION      Family History  Problem Relation Age of Onset  . Parkinson's disease Mother   . Cancer Maternal Aunt   . Cancer Father   . Lung cancer Father   . Breast cancer Neg Hx     Social History:  reports that she quit smoking  about 46 years ago. Her smoking use included cigarettes. She has a 20.00 pack-year smoking history. She has never used smokeless tobacco. She reports that she does not drink alcohol or use drugs.  She has 3 children (daughters: age 62 and 8; son Jaquelyn Bitter): age 73).  She is retired.  She worked for the [employer].  Her husband has dementia.  She lives in [place].  She is alone today.  Allergies:  Allergies  Allergen Reactions  . Fiorinal [Butalbital-Aspirin-Caffeine] Nausea And Vomiting and Other (See Comments)    SEVERE HEADACHE  . Sulfa Antibiotics Other (See Comments)     Burning esophagus and stomach  . Tramadol Nausea And Vomiting  . Other Rash    STERI-STRIPS-RASH    Current Medications: Current Outpatient Medications  Medication Sig Dispense Refill  . acetaminophen (TYLENOL) 500 MG tablet Take 1,000 mg by mouth every 6 (six) hours as needed for moderate pain or headache.     Marland Kitchen atorvastatin (LIPITOR) 10 MG tablet Take 10 mg by mouth every evening.     . Cholecalciferol 2000 UNITS CAPS Take 4,000 Units by mouth daily.     . Lactobacillus (PROBIOTIC ACIDOPHILUS PO) Take by mouth daily.    Marland Kitchen levothyroxine (SYNTHROID, LEVOTHROID) 50 MCG tablet Take 50 mcg by mouth daily before breakfast.    . lidocaine-prilocaine (EMLA) cream Apply 1 application topically as needed. 30 g 6  . MAGNESIUM GLYCINATE PLUS PO Take 1 tablet by mouth at bedtime.     . Melatonin 5 MG TABS Take 5 mg by mouth daily as needed (for sleep.).     Marland Kitchen metFORMIN (GLUCOPHAGE) 500 MG tablet 1,000 mg 2 (two) times daily with a meal.     . quinapril (ACCUPRIL) 5 MG tablet Take 5 mg by mouth every morning.     . sodium bicarbonate 650 MG tablet Take 1,300 mg by mouth at bedtime.    . capecitabine (XELODA) 150 MG tablet Take 2 tablets (300 mg total) by mouth 2 (two) times daily after a meal. Take with three 500mg  tablets. Take for 14days, then hold for 7days (Patient not taking: Reported on 08/16/2018) 56 tablet 1  . capecitabine (XELODA) 500 MG tablet Take 3 tablets (1,500 mg total) by mouth 2 (two) times daily after a meal. Take with two 150mg  tablets. Take for 14days, then hold for 7days (Patient not taking: Reported on 08/16/2018) 84 tablet 1   No current facility-administered medications for this visit.    Facility-Administered Medications Ordered in Other Visits  Medication Dose Route Frequency Provider Last Rate Last Dose  . sodium chloride flush (NS) 0.9 % injection 10 mL  10 mL Intravenous PRN Lequita Asal, MD   10 mL at 10/21/17 0841    Review of Systems  Constitutional: Positive  for weight loss (3 pounds). Negative for chills, diaphoresis, fever and malaise/fatigue.       Feels "pretty good".  HENT: Negative.  Negative for congestion, ear discharge, ear pain, nosebleeds, sinus pain, sore throat and tinnitus.   Eyes: Negative.  Negative for blurred vision, double vision, photophobia, pain and discharge.  Respiratory: Negative.  Negative for hemoptysis, sputum production and shortness of breath.   Cardiovascular: Negative.  Negative for palpitations, orthopnea, leg swelling and PND.  Gastrointestinal: Positive for diarrhea (off and on) and nausea (slight). Negative for abdominal pain, blood in stool, constipation, heartburn, melena and vomiting.  Genitourinary: Negative.  Negative for dysuria, frequency, hematuria and urgency.  Musculoskeletal: Negative for back pain, falls, joint pain,  myalgias and neck pain.       Interval feet tenderness.  Skin: Negative.  Negative for itching and rash.       No palmar or plantar desquamation.  Chronic drainage from seroma, improving.  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 Negative for dizziness, tingling, tremors, speech change, focal weakness, weakness and headaches.  Endo/Heme/Allergies: Does not bruise/bleed easily.       Diabetes.  Psychiatric/Behavioral: Negative.  Negative for depression and memory loss. The patient is not nervous/anxious and does not have insomnia.   All other systems reviewed and are negative.  Performance status (ECOG): 1  Vital signs BP 131/83 (BP Location: Left Arm, Patient Position: Sitting)   Pulse 78   Temp 98.6 F (37 C) (Tympanic)   Resp 16   Wt 144 lb 15.2 oz (65.8 kg)   SpO2 100%   BMI 22.04 kg/m   Physical Exam  Constitutional: She is oriented to person, place, and time and well-developed, well-nourished, and in no distress. No distress.  HENT:  Head: Normocephalic and atraumatic.  Mouth/Throat: Oropharynx is clear and moist and  mucous membranes are normal. No oropharyngeal exudate.  Short gray hair.  Eyes: Pupils are equal, round, and reactive to light. Conjunctivae and EOM are normal. No scleral icterus.  Glasses.  Blue eyes.  Neck: Normal range of motion. Neck supple. No JVD present.  Cardiovascular: Normal rate, regular rhythm and normal heart sounds. Exam reveals no gallop and no friction rub.  No murmur heard. Pulmonary/Chest: Effort normal and breath sounds normal. No respiratory distress. She has no wheezes. She has no rales. Right breast exhibits skin change (right breast seroma with slight drainage).  Abdominal: Soft. Bowel sounds are normal. She exhibits no distension and no mass. There is no abdominal tenderness. There is no rebound and no guarding.  Musculoskeletal: Normal range of motion.        General: No tenderness or edema.  Lymphadenopathy:    She has no cervical adenopathy.    She has no axillary adenopathy.       Right: No supraclavicular adenopathy present.       Left: No supraclavicular adenopathy present.  Neurological: She is alert and oriented to person, place, and time. Gait normal.  Skin: Skin is warm and dry. No rash noted. She is not diaphoretic. No erythema. No pallor.  No palmar or plantar erythema or desqumatation.  Psychiatric: Mood, memory, affect and judgment normal.  Nursing note and vitals reviewed.   Appointment on 08/16/2018  Component Date Value Ref Range Status  . Magnesium 08/16/2018 2.1  1.7 - 2.4 mg/dL Final   Performed at [facility], [address] [place], [place] [address]  . Sodium 08/16/2018 135  135 - 145 mmol/L Final  . Potassium 08/16/2018 3.7  3.5 - 5.1 mmol/L Final  . Chloride 08/16/2018 101  98 - 111 mmol/L Final  . CO2 08/16/2018 26  22 - 32 mmol/L Final  . Glucose, Bld 08/16/2018 221* 70 - 99 mg/dL Final  . BUN 08/16/2018 12  8 - 23 mg/dL Final  . Creatinine, Ser 08/16/2018 0.87  0.44 - 1.00 mg/dL Final  . Calcium 08/16/2018 9.2  8.9  - 10.3 mg/dL Final  . Total Protein 08/16/2018 6.7  6.5 - 8.1 g/dL Final  . Albumin 08/16/2018 4.3  3.5 - 5.0 g/dL Final  . AST 08/16/2018 24  15 - 41 U/L Final  . ALT 08/16/2018 27  0 -  44 U/L Final  . Alkaline Phosphatase 08/16/2018 53  38 - 126 U/L Final  . Total Bilirubin 08/16/2018 1.7* 0.3 - 1.2 mg/dL Final  . GFR calc non Af Amer 08/16/2018 >60  >60 mL/min Final  . GFR calc Af Amer 08/16/2018 >60  >60 mL/min Final  . Anion gap 08/16/2018 8  5 - 15 Final   Performed at [facility], [address], [place], [place] [address]  . WBC 08/16/2018 2.6* 4.0 - 10.5 K/uL Final  . RBC 08/16/2018 3.52* 3.87 - 5.11 MIL/uL Final  . Hemoglobin 08/16/2018 12.0  12.0 - 15.0 g/dL Final  . HCT 08/16/2018 34.2* 36.0 - 46.0 % Final  . MCV 08/16/2018 97.2  80.0 - 100.0 fL Final  . MCH 08/16/2018 34.1* 26.0 - 34.0 pg Final  . MCHC 08/16/2018 35.1  30.0 - 36.0 g/dL Final  . RDW 08/16/2018 17.8* 11.5 - 15.5 % Final  . Platelets 08/16/2018 137* 150 - 400 K/uL Final  . nRBC 08/16/2018 0.0  0.0 - 0.2 % Final  . Neutrophils Relative % 08/16/2018 60  % Final  . Neutro Abs 08/16/2018 1.6* 1.7 - 7.7 K/uL Final  . Lymphocytes Relative 08/16/2018 21  % Final  . Lymphs Abs 08/16/2018 0.6* 0.7 - 4.0 K/uL Final  . Monocytes Relative 08/16/2018 15  % Final  . Monocytes Absolute 08/16/2018 0.4  0.1 - 1.0 K/uL Final  . Eosinophils Relative 08/16/2018 2  % Final  . Eosinophils Absolute 08/16/2018 0.1  0.0 - 0.5 K/uL Final  . Basophils Relative 08/16/2018 1  % Final  . Basophils Absolute 08/16/2018 0.0  0.0 - 0.1 K/uL Final  . Immature Granulocytes 08/16/2018 1  % Final  . Abs Immature Granulocytes 08/16/2018 0.02  0.00 - 0.07 K/uL Final   Performed at [facility], [address], [place], [place] [address]    Assessment:  Stephanie Sanders is a 73 y.o. 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  Pathology revealed two grade  II invasive mammary carcinomas of no special type (4 cm from the nipple and 5 cm from the nipple).  Tumors were in close proximity.  Lymph node biopsy confirmed metastatic carcinoma of breast origin.  Tumor is ER negative (< 1%), PR negative and Her 2/neu 2+ by IHC.  [facility] genetic testing on 07/21/2017 revealed a variant of uncertain significance in RECQL4.  Right sided mammogram and ultrasound on 05/21/2017 revealed 2 highly suspicious masses in the right breast at the 10:30 position 4 cm from nipple (1.8 x 1.2 x 1.1 cm) and at the 10:30 position 5 cm from nipple (1.1 x 0.9 x 0.9 cm).  There was a suspicious 2.7 x 1 x 2.2 cm lymph node in the right axilla with asymmetric nodular cortical thickening.  Ultrasound guided biopsy of the 2 masses and lymph node on 05/28/2017 revealed grade II invasive mammary carcinoma of no special type (4 cm from the nipple and 5 cm from the nipple).  Lymph node biopsy revealed metastatic carcinoma of breast origin.  Tumor is ER negative (< 1%), PR negative and Her 2/neu 2+ by IHC.  FISH was negative.    CA 27.29 has been followed: 16.5 on 06/04/2017 and 14.3 on 05/21/2018.  CA15-3 was 17.6 on 06/04/2017.  Echo on 06/09/2017 revealed an EF of 60-65%.  She has enrolled on the UPBEAT clinical trial.  She received 4 cycles of AC (06/23/2017 - 09/08/2017) with  Udencya support.  Cycle #1 was complicated by influenza A.  Nadir counts included an ANC of 0 and platelet count of 71,000. Cycle #2 postponed due to slow recovery from influenza and excisional cyst removal with surrounding cellulitis (treated with 10 days of doxycycline).   She received 12 weeks of Taxol (09/29/2017 - 01/05/2018).  She received GCSF after week #3 and 5 Taxol secondary to borderline counts. Cycle #5 was postponed on 10/29/2017 due to patient's wishes to be treated on Tuesdays only. Week #7 Taxol was held secondary to neutropenia (ANC 200).  Right mammogram and ultrasound on 09/28/2017 revealed the 2  adjacent masses in the right breast at the 10:30 o'clock position had both decreased in size (1.8 x 1.1 x 1.2 cm to 1.2 x 0.7 x 1.1 cm; 11 x 9 x 9 mm to 7 x 6 x 6 mm) as well as the metastatic right axillary lymph node (2.7 cm to 1.6 x 0.9 x 1.7 cm).    Right partial mastectomy and sentinel lymph node biopsy on 02/01/2018 Pathology revealed 10 mm grade II invasive carcinoma of no special type.  The tumor bed encompassed the biopsy sites, with foci of residual  carcinoma at both biopsy sites and also involving the tissue between the  biopsy sites. Foci of residual carcinoma range from 1 to 10 mm.There was no DCIS.  Distance from closest margin was 1 mm.  Four of 4 lymph nodes were negative.  Pathologic stage was ypT1b (m) ypN0 (sn).  She received breast radiation from 03/10/2018 - 05/07/2018.  She is day 21 of cycle #2 Xeloda (07/06/2018 - 07/27/2018).  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  Normal labs on 11/24/2017 included:  B12, folate, TSH, and copper.  Bone density on 07/01/2018 was normal with a T score of -0.8 and the RIGHT femoral neck.  She was admitted to [facility] from 06/28/2017 - 07/01/2017 with with fever, cough and generalized weakness.  Nasal swab was + for influenza A.  She completed a course of Tamiflu.  Symptomatically, she is tolerating Xeloda well.  She has slight nausea, intermittent diarrhea, and mild transient oral tenderness.  She has no palmar or plantar erythema or desquamation.  WBC 2600 (ANC 1600).  Hemoglobin is 12.  Platelet count is 137,000.  Plan: 1. Labs today: CBC with diff, CMP, Mg. 2. Multifocal triple negative RIGHT breast cancer Clinically doing well. Tolerated has tolerated 2 cycles of Xeloda.  Diarrhea remains well controlled.    Discuss borderline WBC and ANC Discuss declining WBC (ANC) at start of each cycle. Discuss holding Xeloda for 1 week with recheck of counts.   Discuss consideration of Xeloda dose reduction (1500 mg BID 2  weeks on/1 week off).  Re-review plans for 8 cycles of adjuvant capecitabine. 3. Diarrhea Etiology secondary to Xeloda. Intermittent and well managed. 4.   Hypomagnesemia  Magnesium 2.1 (normal).  She continues magnesium glycinate at night.  May need to hold oral magnesium and following levels if loose stools problematic. 5.   Right breast seroma  Patient has chronic drainage.  Follow-up with surgery. 6.   RTC in 1 week for labs (CBC with diff, CMP, FSH, estradiol). 7.   RN to call patient with counts in 1 week to confirm ready to start cycle #3. 8.   RTC in 4 weeks for MD assessment, labs (CBC with diff, CMP, Mg), and start of cycle #4 Xeloda.   I discussed the assessment and treatment plan with the patient.  The patient was provided an opportunity to ask questions and all were answered.  The patient agreed with the plan and demonstrated an understanding of the instructions.  The patient was advised to call back or seek an in person evaluation if the symptoms worsen or if the condition fails to improve as anticipated.    Lequita Asal, MD  08/16/2018 , 11:09 AM

## 2018-08-16 ENCOUNTER — Inpatient Hospital Stay: Payer: Medicare Other

## 2018-08-16 ENCOUNTER — Inpatient Hospital Stay (HOSPITAL_BASED_OUTPATIENT_CLINIC_OR_DEPARTMENT_OTHER): Payer: Medicare Other | Admitting: Hematology and Oncology

## 2018-08-16 ENCOUNTER — Encounter: Payer: Self-pay | Admitting: Hematology and Oncology

## 2018-08-16 ENCOUNTER — Other Ambulatory Visit: Payer: Self-pay

## 2018-08-16 VITALS — BP 131/83 | HR 78 | Temp 98.6°F | Resp 16 | Wt 145.0 lb

## 2018-08-16 DIAGNOSIS — N6489 Other specified disorders of breast: Secondary | ICD-10-CM

## 2018-08-16 DIAGNOSIS — D696 Thrombocytopenia, unspecified: Secondary | ICD-10-CM

## 2018-08-16 DIAGNOSIS — D701 Agranulocytosis secondary to cancer chemotherapy: Secondary | ICD-10-CM

## 2018-08-16 DIAGNOSIS — Z171 Estrogen receptor negative status [ER-]: Secondary | ICD-10-CM

## 2018-08-16 DIAGNOSIS — C50911 Malignant neoplasm of unspecified site of right female breast: Secondary | ICD-10-CM

## 2018-08-16 LAB — CBC WITH DIFFERENTIAL/PLATELET
Abs Immature Granulocytes: 0.02 10*3/uL (ref 0.00–0.07)
Basophils Absolute: 0 10*3/uL (ref 0.0–0.1)
Basophils Relative: 1 %
Eosinophils Absolute: 0.1 10*3/uL (ref 0.0–0.5)
Eosinophils Relative: 2 %
HCT: 34.2 % — ABNORMAL LOW (ref 36.0–46.0)
Hemoglobin: 12 g/dL (ref 12.0–15.0)
Immature Granulocytes: 1 %
Lymphocytes Relative: 21 %
Lymphs Abs: 0.6 10*3/uL — ABNORMAL LOW (ref 0.7–4.0)
MCH: 34.1 pg — ABNORMAL HIGH (ref 26.0–34.0)
MCHC: 35.1 g/dL (ref 30.0–36.0)
MCV: 97.2 fL (ref 80.0–100.0)
Monocytes Absolute: 0.4 10*3/uL (ref 0.1–1.0)
Monocytes Relative: 15 %
Neutro Abs: 1.6 10*3/uL — ABNORMAL LOW (ref 1.7–7.7)
Neutrophils Relative %: 60 %
Platelets: 137 10*3/uL — ABNORMAL LOW (ref 150–400)
RBC: 3.52 MIL/uL — ABNORMAL LOW (ref 3.87–5.11)
RDW: 17.8 % — ABNORMAL HIGH (ref 11.5–15.5)
WBC: 2.6 10*3/uL — ABNORMAL LOW (ref 4.0–10.5)
nRBC: 0 % (ref 0.0–0.2)

## 2018-08-16 LAB — COMPREHENSIVE METABOLIC PANEL
ALT: 27 U/L (ref 0–44)
AST: 24 U/L (ref 15–41)
Albumin: 4.3 g/dL (ref 3.5–5.0)
Alkaline Phosphatase: 53 U/L (ref 38–126)
Anion gap: 8 (ref 5–15)
BUN: 12 mg/dL (ref 8–23)
CO2: 26 mmol/L (ref 22–32)
Calcium: 9.2 mg/dL (ref 8.9–10.3)
Chloride: 101 mmol/L (ref 98–111)
Creatinine, Ser: 0.87 mg/dL (ref 0.44–1.00)
GFR calc Af Amer: >60 mL/min (ref >60)
GFR calc non Af Amer: >60 mL/min (ref >60)
Glucose, Bld: 221 mg/dL — ABNORMAL HIGH (ref 70–99)
Potassium: 3.7 mmol/L (ref 3.5–5.1)
Sodium: 135 mmol/L (ref 135–145)
Total Bilirubin: 1.7 mg/dL — ABNORMAL HIGH (ref 0.3–1.2)
Total Protein: 6.7 g/dL (ref 6.5–8.1)

## 2018-08-16 LAB — MAGNESIUM: Magnesium: 2.1 mg/dL (ref 1.7–2.4)

## 2018-08-16 NOTE — Progress Notes (Signed)
Pt here for follow up. Right Breast still draining (since November). States it is drying up some. Serosanguinous drainage per patient's description.  Next follow up with Surgeon is early July per patient. Denies foul odor.

## 2018-08-22 ENCOUNTER — Other Ambulatory Visit: Payer: Self-pay

## 2018-08-23 ENCOUNTER — Other Ambulatory Visit: Payer: Self-pay

## 2018-08-23 ENCOUNTER — Inpatient Hospital Stay: Payer: Medicare Other | Attending: Hematology and Oncology

## 2018-08-23 ENCOUNTER — Telehealth: Payer: Self-pay

## 2018-08-23 ENCOUNTER — Other Ambulatory Visit: Payer: Self-pay | Admitting: Hematology and Oncology

## 2018-08-23 DIAGNOSIS — C50911 Malignant neoplasm of unspecified site of right female breast: Secondary | ICD-10-CM

## 2018-08-23 DIAGNOSIS — Z171 Estrogen receptor negative status [ER-]: Secondary | ICD-10-CM | POA: Insufficient documentation

## 2018-08-23 DIAGNOSIS — Z923 Personal history of irradiation: Secondary | ICD-10-CM | POA: Insufficient documentation

## 2018-08-23 DIAGNOSIS — R197 Diarrhea, unspecified: Secondary | ICD-10-CM | POA: Diagnosis not present

## 2018-08-23 DIAGNOSIS — N6489 Other specified disorders of breast: Secondary | ICD-10-CM | POA: Insufficient documentation

## 2018-08-23 DIAGNOSIS — Z801 Family history of malignant neoplasm of trachea, bronchus and lung: Secondary | ICD-10-CM | POA: Insufficient documentation

## 2018-08-23 DIAGNOSIS — R17 Unspecified jaundice: Secondary | ICD-10-CM

## 2018-08-23 LAB — COMPREHENSIVE METABOLIC PANEL
ALT: 28 U/L (ref 0–44)
AST: 26 U/L (ref 15–41)
Albumin: 4.5 g/dL (ref 3.5–5.0)
Alkaline Phosphatase: 55 U/L (ref 38–126)
Anion gap: 8 (ref 5–15)
BUN: 13 mg/dL (ref 8–23)
CO2: 28 mmol/L (ref 22–32)
Calcium: 9.7 mg/dL (ref 8.9–10.3)
Chloride: 103 mmol/L (ref 98–111)
Creatinine, Ser: 0.74 mg/dL (ref 0.44–1.00)
GFR calc Af Amer: >60 mL/min (ref >60)
GFR calc non Af Amer: >60 mL/min (ref >60)
Glucose, Bld: 112 mg/dL — ABNORMAL HIGH (ref 70–99)
Potassium: 4 mmol/L (ref 3.5–5.1)
Sodium: 139 mmol/L (ref 135–145)
Total Bilirubin: 2 mg/dL — ABNORMAL HIGH (ref 0.3–1.2)
Total Protein: 6.8 g/dL (ref 6.5–8.1)

## 2018-08-23 LAB — CBC WITH DIFFERENTIAL/PLATELET
Abs Immature Granulocytes: 0.01 10*3/uL (ref 0.00–0.07)
Basophils Absolute: 0 10*3/uL (ref 0.0–0.1)
Basophils Relative: 1 %
Eosinophils Absolute: 0.1 10*3/uL (ref 0.0–0.5)
Eosinophils Relative: 3 %
HCT: 35.9 % — ABNORMAL LOW (ref 36.0–46.0)
Hemoglobin: 12.4 g/dL (ref 12.0–15.0)
Immature Granulocytes: 0 %
Lymphocytes Relative: 23 %
Lymphs Abs: 0.6 10*3/uL — ABNORMAL LOW (ref 0.7–4.0)
MCH: 33.9 pg (ref 26.0–34.0)
MCHC: 34.5 g/dL (ref 30.0–36.0)
MCV: 98.1 fL (ref 80.0–100.0)
Monocytes Absolute: 0.5 10*3/uL (ref 0.1–1.0)
Monocytes Relative: 17 %
Neutro Abs: 1.5 10*3/uL — ABNORMAL LOW (ref 1.7–7.7)
Neutrophils Relative %: 56 %
Platelets: 104 10*3/uL — ABNORMAL LOW (ref 150–400)
RBC: 3.66 MIL/uL — ABNORMAL LOW (ref 3.87–5.11)
RDW: 18.5 % — ABNORMAL HIGH (ref 11.5–15.5)
WBC: 2.7 10*3/uL — ABNORMAL LOW (ref 4.0–10.5)
nRBC: 0 % (ref 0.0–0.2)

## 2018-08-23 NOTE — Telephone Encounter (Signed)
-----   Message from Lequita Asal, MD sent at 08/23/2018 10:41 AM EDT ----- Regarding: Please call patient  Do not start next cycle of Xeloda.  Platelet count has dropped.  Recheck labs in 1 week.  M ----- Message ----- From: Buel Ream, Lab In [place] Sent: 08/23/2018  10:29 AM EDT To: Lequita Asal, MD

## 2018-08-23 NOTE — Telephone Encounter (Signed)
Spoke with the patient to inform her that her platetel counts has dropped and dont start the next cycle of Xeloda / recheck labs in 1 week. There patient has asked to be schedule in [place] for labs only in 1 week. The patient has been schedule for 07/31/2018 at 10:30 for labs only. The patient was understanding and agreeable.

## 2018-08-24 LAB — FOLLICLE STIMULATING HORMONE: FSH: 58.1 m[IU]/mL

## 2018-08-24 LAB — ESTRADIOL: Estradiol: <5 pg/mL

## 2018-08-30 ENCOUNTER — Inpatient Hospital Stay: Payer: Medicare Other

## 2018-08-30 ENCOUNTER — Other Ambulatory Visit: Payer: Self-pay

## 2018-08-30 DIAGNOSIS — Z171 Estrogen receptor negative status [ER-]: Secondary | ICD-10-CM

## 2018-08-30 DIAGNOSIS — D696 Thrombocytopenia, unspecified: Secondary | ICD-10-CM

## 2018-08-30 DIAGNOSIS — C50911 Malignant neoplasm of unspecified site of right female breast: Secondary | ICD-10-CM

## 2018-08-30 LAB — CBC WITH DIFFERENTIAL/PLATELET
Abs Immature Granulocytes: 0.01 10*3/uL (ref 0.00–0.07)
Basophils Absolute: 0 10*3/uL (ref 0.0–0.1)
Basophils Relative: 0 %
Eosinophils Absolute: 0.1 10*3/uL (ref 0.0–0.5)
Eosinophils Relative: 4 %
HCT: 38.4 % (ref 36.0–46.0)
Hemoglobin: 13.2 g/dL (ref 12.0–15.0)
Immature Granulocytes: 0 %
Lymphocytes Relative: 18 %
Lymphs Abs: 0.5 10*3/uL — ABNORMAL LOW (ref 0.7–4.0)
MCH: 33.8 pg (ref 26.0–34.0)
MCHC: 34.4 g/dL (ref 30.0–36.0)
MCV: 98.5 fL (ref 80.0–100.0)
Monocytes Absolute: 0.4 10*3/uL (ref 0.1–1.0)
Monocytes Relative: 13 %
Neutro Abs: 1.9 10*3/uL (ref 1.7–7.7)
Neutrophils Relative %: 65 %
Platelets: 114 10*3/uL — ABNORMAL LOW (ref 150–400)
RBC: 3.9 MIL/uL (ref 3.87–5.11)
RDW: 17.5 % — ABNORMAL HIGH (ref 11.5–15.5)
WBC: 3 10*3/uL — ABNORMAL LOW (ref 4.0–10.5)
nRBC: 0 % (ref 0.0–0.2)

## 2018-08-31 ENCOUNTER — Telehealth: Payer: Self-pay

## 2018-08-31 NOTE — Telephone Encounter (Signed)
-----   Message from Secundino Ginger sent at 08/31/2018  2:04 PM EDT ----- Regarding: lab results/ medication Contact: [phone] Pt had labs yesterday, no one called her to let her know if she needs to take Xeloda. Pt is not feeling good. She is exhausted, feels bad.

## 2018-08-31 NOTE — Telephone Encounter (Signed)
Informed Stephanie Sanders Stephanie Sanders has approved for her to start back on Xeloda. Stephanie Sanders is opting to start back next week, with Dr. Kem Parkinson permission, due to feeling exhausted. Stephanie Sanders is denying any other symptoms at this time.  Stephanie Sanders reports she continues to have drainage from breast. Seen Dr. Windell Moment last Monday and was advised possible surgery in future if Silver nitrate, etc. Does not work. Stephanie Sanders is to inform us of his decision.

## 2018-09-02 ENCOUNTER — Ambulatory Visit: Payer: Self-pay | Admitting: General Surgery

## 2018-09-02 NOTE — H&P (Signed)
HISTORY OF PRESENT ILLNESS:    Ms. 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  She reports that after her last visit the area started to heal and decrease the drainage but since 2 days ago it started to drain significant amount of blood and serous secretion.  The area is also tender to palpation.  Denies fever or chills.  Pain does not radiate to other part of the body.      PAST MEDICAL HISTORY:      Past Medical History:  Diagnosis Date  . Breast cancer (CMS-HCC) 2019  . Diabetic nephropathy (CMS-HCC) 1999  . Heart murmur, unspecified 07/2012   normal EF, mod MR, mild to mod TR, mild dilation of left and right atrium  . History of migraine   . Hypertension   . Seborrheic keratosis   . Serrated adenoma of colon, unspecified 01/01/2015  . Thyroid disease   . Vitamin D deficiency         PAST SURGICAL HISTORY:        Past Surgical History:  Procedure Laterality Date  . APPENDECTOMY  1980  . CHOLECYSTECTOMY  2011   stent placed due to bile leak  . COLONOSCOPY  01/01/2015   Serrated adenoma of colon/Repeat 54yr/MGR  . EYELID SURGERY  2006  . MASTECTOMY, PARTIAL Right 02/01/2018   Dr ELesli Albee . TUBAL LIGATION           MEDICATIONS:  EncounterMedications        Outpatient Encounter Medications as of 09/02/2018  Medication Sig Dispense Refill  . acetaminophen (TYLENOL) 500 MG tablet Take 500 mg by mouth    . atorvastatin (LIPITOR) 10 MG tablet Take 1 tablet (10 mg total) by mouth once daily 90 tablet 1  . blood glucose diagnostic (ONETOUCH ULTRA TEST) test strip Use 1 each once daily Use as instructed. 100 each 2  . blood glucose meter kit kit One Touch Ultra Meter Use as instructed. 1 each 0  . cholecalciferol (VITAMIN D3) 2,000 unit capsule Take 4,000 Units by mouth daily.     .Marland Kitchenibuprofen (MOTRIN) 200 MG tablet Take 200 mg by mouth every 6 (six) hours as needed for Pain    .  metFORMIN (GLUCOPHAGE) 1000 MG tablet Take 1 tablet (1,000 mg total) by mouth 2 (two) times daily with meals 60 tablet 5  . quinapriL (ACCUPRIL) 5 MG tablet Take 1 tablet (5 mg total) by mouth once daily 90 tablet 1  . sodium bicarbonate 650 MG tablet Take 650 mg by mouth    . SYNTHROID 50 mcg tablet Take 1 tablet (50 mcg total) by mouth once daily Take on an empty stomach with a glass of water at least 30-60 minutes before breakfast. 90 tablet 1   No facility-administered encounter medications on file as of 09/02/2018.        ALLERGIES:   Fiorinal [butalbital-aspirin-caffeine]; Other; Septra [sulfamethoxazole-trimethoprim]; Sulfa (sulfonamide antibiotics); and Tramadol   SOCIAL HISTORY:  Social History          Socioeconomic History  . Marital status: Married    Spouse name: WCletus Gash . Number of children: 3  . Years of education: 143 . Highest education level: Not on file  Occupational History  . Occupation: Retired-[employer] job  Social Needs  . Financial resource strain: Not on file  . Food insecurity:    Worry: Not on file    Inability: Not on  file  . Transportation needs:    Medical: Not on file    Non-medical: Not on file  Tobacco Use  . Smoking status: Former Smoker    Packs/day: 2.00    Years: 10.00    Pack years: 20.00    Types: Cigarettes    Last attempt to quit: 04/22/1975    Years since quitting: 43.3  . Smokeless tobacco: Never Used  . Tobacco comment: stopped 36 years ago  Substance and Sexual Activity  . Alcohol use: No    Alcohol/week: 0.0 standard drinks    Comment: stopped 1996  . Drug use: No  . Sexual activity: Yes    Partners: Male  Other Topics Concern  . Not on file  Social History Narrative  . Not on file      FAMILY HISTORY:       Family History  Problem Relation Age of Onset  . Coronary Artery Disease (Blocked arteries around heart) Father 55       tobacco use, poor diet  . Lung cancer  Father   . Pancreatic cancer Father   . Ovarian cancer Maternal Aunt 67  . Diabetes type II Mother   . Parkinsonism Mother   . Thyroid disease Mother   . Diabetes type II Brother   . Stroke Paternal Aunt   . Diabetes type II Paternal Grandmother   . Stroke Paternal Grandfather   . Lung cancer Maternal Aunt   . Breast cancer Neg Hx   . Osteoporosis (Thinning of bones) Neg Hx      GENERAL REVIEW OF SYSTEMS:   General ROS: negative for - chills, fatigue, fever, weight gain or weight loss Allergy and Immunology ROS: negative for - hives  Hematological and Lymphatic ROS: negative for - bleeding problems or bruising, negative for palpable nodes Endocrine ROS: negative for - heat or cold intolerance, hair changes Respiratory ROS: negative for - cough, shortness of breath or wheezing Cardiovascular ROS: no chest pain or palpitations GI ROS: negative for nausea, vomiting, abdominal pain, diarrhea, constipation Musculoskeletal ROS: negative for - joint swelling or muscle pain Neurological ROS: negative for - confusion, syncope Dermatological ROS: negative for pruritus and rash  PHYSICAL EXAM:     Vitals:   09/02/18 1424  BP: 124/76  Pulse: 88  .  Ht:172.7 cm (_0 ) Wt:67.1 kg (148 lb) WUJ:WJXB surface area is 1.79 meters squared. Body mass index is 22.5 kg/m.Marland Kitchen   GENERAL: Alert, active, oriented x3  HEENT: Pupils equal reactive to light. Extraocular movements are intact. Sclera clear. Palpebral conjunctiva normal red color.Pharynx clear.  NECK: Supple with no palpable mass and no adenopathy.  LUNGS: Sound clear with no rales rhonchi or wheezes.  HEART: Regular rhythm S1 and S2 without murmur.  BREAST: Right breast palpable collection with a chronic nonhealing wound with serosanguineous drainage.  No associated erythema.  EXTREMITIES: Well-developed well-nourished symmetrical with no dependent edema.  NEUROLOGICAL: Awake alert oriented, facial  expression symmetrical, moving all extremities.      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  BreastSeroma on partial mastectomy area.Seroma of breast [N64.89] -Continue with persistent drainage, chronic now changing to a more old bloody nature is serous and sometimes purulent drainage.  Due  to the long time with this process and now getting worse with the more open wound and nonhealing area, I consider revision of the wound with evacuation of the hematoma or purulent secretions at the OR is a reasonable option.  Patient oriented about the procedure the benefit and risks and agreed to proceed.      PLAN:  1.  Evacuation of hematoma versus abscess of the right breast (19120) 2.  Continue local care with soap and water of the area and keep it dry 3.  We will coordinate for procedure and will give you further instruction regarding preadmission protocol. 4.  Avoid taking aspirin 5 days before procedure.  Patient verbalized understanding, all questions were answered, and were agreeable with the plan outlined above.   Herbert Pun, MD  Electronically signed by Herbert Pun, MD

## 2018-09-03 ENCOUNTER — Other Ambulatory Visit: Payer: Self-pay

## 2018-09-03 ENCOUNTER — Encounter
Admission: RE | Admit: 2018-09-03 | Discharge: 2018-09-03 | Disposition: A | Discharge status: Home / Self Care | Payer: Medicare Other | Source: Ambulatory Visit | Attending: General Surgery | Admitting: General Surgery

## 2018-09-03 DIAGNOSIS — Z1159 Encounter for screening for other viral diseases: Secondary | ICD-10-CM | POA: Diagnosis present

## 2018-09-05 LAB — NOVEL CORONAVIRUS, NAA (HOSP ORDER, SEND-OUT TO REF LAB; TAT 18-24 HRS): SARS-CoV-2, NAA: NOT DETECTED

## 2018-09-06 ENCOUNTER — Inpatient Hospital Stay: Admission: RE | Admit: 2018-09-06 | Payer: Medicare Other | Source: Ambulatory Visit

## 2018-09-08 ENCOUNTER — Encounter: Admission: RE | Payer: Self-pay | Source: Home / Self Care

## 2018-09-08 ENCOUNTER — Ambulatory Visit: Admission: RE | Admit: 2018-09-08 | Payer: Medicare Other | Source: Home / Self Care | Admitting: General Surgery

## 2018-09-08 SURGERY — EVACUATION HEMATOMA
Anesthesia: General | Laterality: Right

## 2018-09-09 ENCOUNTER — Telehealth: Payer: Self-pay

## 2018-09-09 ENCOUNTER — Telehealth: Payer: Self-pay | Admitting: *Deleted

## 2018-09-09 NOTE — Telephone Encounter (Signed)
Patient requesting call back to discuss her apts on Tuesday. States that she tried to contact the [facility] but unable to reach Dr. Kem Parkinson team to discuss her concerns. She has been off her oral chemotherapy due to a planned surgery. The surgery has now been cnl by her surgeon and will not be r/s as surgeon does not believe this is medically necessary. Pt has an apt with lab/port flush and Dr. Mike Gip on Tuesday would like to know if Dr. Mike Gip would still like her to keep this apt and also would they be discussing restarting her oral chemotherapy. Thanks. Please return pt's phone call.

## 2018-09-09 NOTE — Telephone Encounter (Signed)
Spoke with the patient due to message we have received from [employer] manager). The patient is very concerned about restarting her oral medication for chemo and do she need to keep her schedule appointment at this time. I have informed the patient to keep schedule appointment. The patient was understanding and agreeable.

## 2018-09-13 DIAGNOSIS — N6489 Other specified disorders of breast: Secondary | ICD-10-CM | POA: Insufficient documentation

## 2018-09-13 NOTE — Progress Notes (Signed)
[facility]  [address], [address] [place], [place] [address] Phone: [phone]  Fax: [phone]   Clinic Day:  09/14/2018  Referring physician: Baxter Hire, MD  Chief Complaint: Stephanie Sanders is a 73 y.o. 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  HPI: The patient was last seen in the medical oncology clinic on 08/16/2018. At that time, she was tolerating Xeloda well. She had slight nausea, intermittent diarrhea, and mild transient oral tenderness. She had no palmar or plantar erythema or desquamation. WBC 2600 (ANC 1600). Hemoglobin was 12. Platelet count was 137,000.   Cycle #3 of Xeloda was delayed due to low counts and patient's extreme fatigue. In addition, she was scheduled surgery on 09/08/2018 for a right breast seroma with continued drainage. Subsequently, surgery was deemed not medically neccessary and canceled.  CBC followed: 08/23/2018: WBC 2,700 (ANC 1,500), hemoglobin 12.4, hematocrit 35.9, platelets 104,000. Estradiol was <5.0. FSH was 58.1.  08/30/2018: WBC 3,000 (ANC 1,900), hemoglobin 13.2, hematocrit 38.4, platelets 114,000.  During the interim, she is "feeling good."  She saw Dr. Peyton Najjar on 09/02/2018 for bloody drainage from her right breast seroma and scheduled surgery. She reports it scabbed over and she only very occasionally sees a very small amount of blood. She canceled her surgery due to concerns over a recovery and removal of more tissue and is waiting to see if it heals on its own.   She reports her vision is not good, but otherwise all her symptoms from Xeloda have resolved. Her BMs are at her baseline, about 1 formed BM every other day.   She presents today with a piece of paper regarding the side effects that she has experienced with Xeloda.  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  She has none of these symptoms currently.   Past Medical History:  Diagnosis Date   Cancer ([place]) 04/2017   right breast   Diabetes mellitus without complication (HCC)    Diabetic nephropathy (HCC)    Headache    migraines   Heart murmur    ASYMPTOMATIC   Hypertension    Hypothyroidism    Personal history of chemotherapy 2019   right breast cancer   Pneumonia    Seborrheic keratosis    Vitamin D deficiency     Past Surgical History:  Procedure Laterality Date   APPENDECTOMY     AXILLARY LYMPH NODE DISSECTION Right 02/01/2018   Procedure: AXILLARY LYMPH NODE DISSECTION;  Surgeon: Herbert Pun, MD;  Location: [facility] ORS;  Service: General;  Laterality: Right;   BREAST BIOPSY Right 05/28/2017   right breast bx 3 areas invasive mamm ca lymph node mets   BREAST CYST ASPIRATION Bilateral    neg   BREAST LUMPECTOMY Right 02/01/2018   CHOLECYSTECTOMY     COLONOSCOPY WITH PROPOFOL N/A 01/01/2015   Procedure: COLONOSCOPY WITH PROPOFOL;  Surgeon: Josefine Class, MD;  Location: [facility] ENDOSCOPY;  Service: Endoscopy;  Laterality: N/A;   EYE SURGERY Left    eyelid   PARTIAL MASTECTOMY WITH NEEDLE LOCALIZATION Right 02/01/2018   Procedure: PARTIAL MASTECTOMY WITH NEEDLE LOCALIZATION;  Surgeon: Herbert Pun, MD;  Location: [facility] ORS;  Service: General;  Laterality: Right;   PORTACATH PLACEMENT  Right 06/10/2017   Procedure: INSERTION PORT-A-CATH;  Surgeon: Herbert Pun, MD;  Location: [facility] ORS;  Service: General;  Laterality: Right;   SENTINEL NODE BIOPSY Right 02/01/2018   Procedure: SENTINEL NODE BIOPSY;  Surgeon: Herbert Pun, MD;  Location: [facility] OR[facility];  Service: General;  Laterality: Right;   STENT PLACEMENT ILIAC ([place] HX)     temporary placement s/p  cholecystectomy   TUBAL LIGATION      Family History  Problem Relation Age of Onset   Parkinson's disease Mother    Cancer Maternal Aunt    Cancer Father    Lung cancer Father    Breast cancer Neg Hx     Social History:  reports that she quit smoking about 46 years ago. Her smoking use included cigarettes. She has a 20.00 pack-year smoking history. She has never used smokeless tobacco. She reports that she does not drink alcohol or use drugs.She has 3 children (daughters: age 32 and 81; son Jaquelyn Bitter): age 51).  She is retired.  She worked for the [employer].  Her husband has dementia.  She lives in [place].  She is alone today.  Allergies:  Allergies  Allergen Reactions   Fiorinal [Butalbital-Aspirin-Caffeine] Nausea And Vomiting and Other (See Comments)    SEVERE HEADACHE   Sulfa Antibiotics Other (See Comments)    Burning esophagus and stomach   Tramadol Nausea And Vomiting   Other Rash    STERI-STRIPS-RASH    Current Medications: Current Outpatient Medications  Medication Sig Dispense Refill   atorvastatin (LIPITOR) 10 MG tablet Take 10 mg by mouth every evening.      Cholecalciferol 2000 UNITS CAPS Take 4,000 Units by mouth daily.      Lactobacillus (PROBIOTIC ACIDOPHILUS PO) Take 1 capsule by mouth daily.      levothyroxine (SYNTHROID, LEVOTHROID) 50 MCG tablet Take 50 mcg by mouth daily before breakfast.     MAGNESIUM GLYCINATE PLUS PO Take 1 tablet by mouth at bedtime.      metFORMIN (GLUCOPHAGE) 1000 MG tablet Take 1,000 mg by mouth 2 (two) times daily with a meal.     quinapril (ACCUPRIL) 5 MG tablet Take 5 mg by mouth every morning.      sodium bicarbonate 650 MG tablet Take 1,300 mg by mouth at bedtime.     acetaminophen (TYLENOL) 500 MG tablet Take 1,000 mg by mouth every 6 (six) hours as needed for moderate pain or headache.      capecitabine (XELODA) 150 MG tablet Take 2 tablets (300 mg total) by mouth 2 (two) times daily after a meal. Take with  three 500mg  tablets. Take for 14days, then hold for 7days (Patient not taking: Reported on 09/14/2018) 56 tablet 1   capecitabine (XELODA) 500 MG tablet Take 3 tablets (1,500 mg total) by mouth 2 (two) times daily after a meal. Take with two 150mg  tablets. Take for 14days, then hold for 7days (Patient not taking: Reported on 09/14/2018) 84 tablet 1   lidocaine-prilocaine (EMLA) cream Apply 1 application topically as needed. (Patient not taking: Reported on 09/14/2018) 30 g 6   No current facility-administered medications for this visit.    Facility-Administered Medications Ordered in Other Visits  Medication Dose Route Frequency Provider Last Rate Last Dose   sodium chloride flush (NS) 0.9 % injection 10 mL  10 mL Intravenous PRN Lequita Asal, MD   10 mL at 10/21/17 0841    Review of Systems  Constitutional: Negative for chills, diaphoresis, fever, malaise/fatigue and weight loss (up 2  pounds).       Feels "pretty good".  HENT: Negative.  Negative for congestion, ear discharge, ear pain, nosebleeds, sinus pain, sore throat and tinnitus.   Eyes: Positive for blurred vision. Negative for double vision, photophobia, pain and discharge.  Respiratory: Negative.  Negative for hemoptysis, sputum production and shortness of breath.   Cardiovascular: Negative.  Negative for palpitations, orthopnea, leg swelling and PND.  Gastrointestinal: Positive for diarrhea (intermittent, resolved) and nausea (resolved). Negative for abdominal pain, blood in stool, constipation, heartburn, melena and vomiting.       1 formed BM every other day  Genitourinary: Negative.  Negative for dysuria, frequency, hematuria and urgency.  Musculoskeletal: Negative for back pain, falls, joint pain, myalgias and neck pain.  Skin: Negative.  Negative for itching and rash.       No palmar or plantar desquamation.  History of chronic drainage from seroma, now scabbed over.  Neurological: Positive for sensory change (stable  neuropathy and left foot and hand only, improved). Negative for dizziness, tingling, tremors, speech change, focal weakness, weakness and headaches.  Endo/Heme/Allergies: Does not bruise/bleed easily.       Diabetes.  Psychiatric/Behavioral: Negative.  Negative for depression and memory loss. The patient is not nervous/anxious and does not have insomnia.   All other systems reviewed and are negative.  Performance status (ECOG):  1  Physical Exam  Constitutional: She is oriented to person, place, and time. Vital signs are normal. She appears well-developed and well-nourished. No distress.  HENT:  Head: Normocephalic and atraumatic.  Mouth/Throat: Oropharynx is clear and moist and mucous membranes are normal. No oropharyngeal exudate.  Short gray hair.  Eyes: Pupils are equal, round, and reactive to light. Conjunctivae and EOM are normal. No scleral icterus.  Glasses.  Blue eyes.   Neck: Normal range of motion. Neck supple. No JVD present.  Cardiovascular: Normal rate, regular rhythm and normal heart sounds.  No murmur heard. Pulmonary/Chest: Effort normal. No respiratory distress. She has no wheezes. She has no rales. Right breast exhibits skin change (10 x 5 mm eschar overlying seroma).  Abdominal: Soft. Normal appearance and bowel sounds are normal. She exhibits no distension and no mass. There is no abdominal tenderness. There is no rebound and no guarding.  Musculoskeletal: Normal range of motion.        General: No edema.  Lymphadenopathy:    She has no cervical adenopathy.    She has no axillary adenopathy.       Right: No supraclavicular adenopathy present.       Left: No supraclavicular adenopathy present.  Neurological: She is alert and oriented to person, place, and time.  Skin: Skin is warm and dry. No rash noted. She is not diaphoretic. No erythema. No pallor.  No palmar or plantar erythema or desqumatation.   Psychiatric: She has a normal mood and affect. Her behavior is  normal. Judgment and thought content normal.  Nursing note and vitals reviewed.   Infusion on 09/14/2018  Component Date Value Ref Range Status   Bilirubin, Direct 09/14/2018 0.1  0.0 - 0.2 mg/dL Final   Performed at [facility], [address] [place], [place] [address]   Magnesium 09/14/2018 2.0  1.7 - 2.4 mg/dL Final   Performed at [facility], [address] [place], [place] [address]   Sodium 09/14/2018 135  135 - 145 mmol/L Final   Potassium 09/14/2018 3.6  3.5 - 5.1 mmol/L Final   Chloride 09/14/2018 100  98 - 111 mmol/L Final   CO2 09/14/2018 25  22 - 32 mmol/L Final   Glucose, Bld 09/14/2018 132* 70 - 99 mg/dL Final   BUN 09/14/2018 14  8 - 23 mg/dL Final   Creatinine, Ser 09/14/2018 0.74  0.44 - 1.00 mg/dL Final   Calcium 09/14/2018 9.3  8.9 - 10.3 mg/dL Final   Total Protein 09/14/2018 6.8  6.5 - 8.1 g/dL Final   Albumin 09/14/2018 4.4  3.5 - 5.0 g/dL Final   AST 09/14/2018 22  15 - 41 U/L Final   ALT 09/14/2018 26  0 - 44 U/L Final   Alkaline Phosphatase 09/14/2018 48  38 - 126 U/L Final   Total Bilirubin 09/14/2018 1.2  0.3 - 1.2 mg/dL Final   GFR calc non Af Amer 09/14/2018 >60  >60 mL/min Final   GFR calc Af Amer 09/14/2018 >60  >60 mL/min Final   Anion gap 09/14/2018 10  5 - 15 Final   Performed at [facility], [address], [place], [place] [address]   WBC 09/14/2018 3.5* 4.0 - 10.5 K/uL Final   RBC 09/14/2018 3.85* 3.87 - 5.11 MIL/uL Final   Hemoglobin 09/14/2018 13.0  12.0 - 15.0 g/dL Final   HCT 09/14/2018 38.0  36.0 - 46.0 % Final   MCV 09/14/2018 98.7  80.0 - 100.0 fL Final   MCH 09/14/2018 33.8  26.0 - 34.0 pg Final   MCHC 09/14/2018 34.2  30.0 - 36.0 g/dL Final   RDW 09/14/2018 14.6  11.5 - 15.5 % Final   Platelets 09/14/2018 129* 150 - 400 K/uL Final   nRBC 09/14/2018 0.0  0.0 - 0.2 % Final   Performed at [facility], [address], [place], [place] [address]     Assessment:  NEILA TEEM is a 73 y.o. female  with multi-focal triple negative clinical stage [id number] right breast cancer s/p biopsy on 05/28/2017.  Pathology revealed two grade II invasive mammary carcinomas of no special type (4 cm from the nipple and 5 cm from the nipple).  Tumors were in close proximity.  Lymph node biopsy confirmed metastatic carcinoma of breast origin.  Tumor is ER negative (< 1%), PR negative and Her 2/neu 2+ by IHC.  [facility] genetic testing on 07/21/2017 revealed a variant of uncertain significance in RECQL4.  Right sided mammogram and ultrasound on 05/21/2017 revealed 2 highly suspicious masses in the right breast at the 10:30 position 4 cm from nipple (1.8 x 1.2 x 1.1 cm) and at the 10:30 position 5 cm from nipple (1.1 x 0.9 x 0.9 cm).  There was a suspicious 2.7 x 1 x 2.2 cm lymph node in the right axilla with asymmetric nodular cortical thickening.  Ultrasound guided biopsy of the 2 masses and lymph node on 05/28/2017 revealed grade II invasive mammary carcinoma of no special type (4 cm from the nipple and 5 cm from the nipple).  Lymph node biopsy revealed metastatic carcinoma of breast origin.  Tumor is ER negative (< 1%), PR negative and Her 2/neu 2+ by IHC.  FISH was negative.    CA 27.29 has been followed: 16.5 on 06/04/2017 and 14.3 on 05/21/2018.  CA15-3 was 17.6 on 06/04/2017.  Echo on 06/09/2017 revealed an EF of 60-65%.  She has enrolled on the UPBEAT clinical trial.  She received 4 cycles of AC (06/23/2017 - 09/08/2017) with Udencya support.  Cycle #1 was complicated by influenza A.  Nadir counts included an ANC of 0  and platelet count of 71,000. Cycle #2 postponed due to slow recovery from influenza and excisional cyst removal with surrounding cellulitis (treated with 10 days of doxycycline).   She received 12 weeks of Taxol (09/29/2017 - 01/05/2018).  She received GCSF after week #3 and 5 Taxol secondary to borderline counts. Cycle #5 was postponed  on 10/29/2017 due to patient's wishes to be treated on Tuesdays only. Week #7 Taxol was held secondary to neutropenia (ANC 200).  Right mammogram and ultrasound on 09/28/2017 revealed the 2 adjacent masses in the right breast at the 10:30 o'clock position had both decreased in size (1.8 x 1.1 x 1.2 cm to 1.2 x 0.7 x 1.1 cm; 11 x 9 x 9 mm to 7 x 6 x 6 mm) as well as the metastatic right axillary lymph node (2.7 cm to 1.6 x 0.9 x 1.7 cm).    Right partial mastectomy and sentinel lymph node biopsy on 02/01/2018 Pathology revealed 10 mm grade II invasive carcinoma of no special type.  The tumor bed encompassed the biopsy sites, with foci of residual  carcinoma at both biopsy sites and also involving the tissue between the  biopsy sites. Foci of residual carcinoma range from 1 to 10 mm.There was no DCIS.  Distance from closest margin was 1 mm.  Four of 4 lymph nodes were negative.  Pathologic stage was ypT1b (m) ypN0 (sn).  She received breast radiation from 03/10/2018 - 05/07/2018.  She is s/p cycle #2 Xeloda (07/06/2018 - 07/27/2018).  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  Normal labs on 11/24/2017 included:  B12, folate, TSH, and copper.  Bone densityon 07/01/2018 was normal witha T score of -0.8 and the RIGHTfemoral neck.  She was admitted to [facility] from 06/28/2017 - 07/01/2017 with with fever, cough and generalized weakness.  Nasal swab was + for influenza A.  She completed a course of Tamiflu.  Symptomatically, she is doing well.  Exam reveals an eschar (10 x 5 mm) overlying her seroma.  Hemoglobin is 13.1.  Platelets are 133,000.  WBC 3500 ([place] 2100).  Plan: 1. Labs today: CBC with diff, CMP, direct bilirubin, Mg. 2. Multifocal triple negative RIGHT breast cancer Clinically she is doing well. She has tolerated 2 cycles of Xeloda.  Side effects experienced with cycle #2 reviewed.  Discuss importance of reinitiating treatment.  Review plan for a total of 8  cycles. Re-review declining WBC (ANC) at start of each cycle.        Discuss plan for Xeloda dose reduction (1500 mg BID 2 weeks on/1 week off).  Patient to start cycle #3 Xeloda tomorrow. 3. Diarrhea Patient has formed bowel movements off Xeloda. 4.   Hypomagnesemia             Magnesium 2.0 (normal).             Continue magnesium glycinate at night. 5.   Right breast seroma             Patient has a history of chronic drainage.             Currently she has an eschar in place.  Discuss plan to follow-up with surgery if breast begins to drain again.  Dr. Windell Moment contacted. 6.  RTC in 3 weeks for MD assessment, labs (CBC with diff, CMP), and cycle #4 Xeloda.    I discussed the assessment and treatment plan with the patient.  The patient was provided an opportunity to ask questions and  all were answered.  The patient agreed with the plan and demonstrated an understanding of the instructions.  The patient was advised to call back if the symptoms worsen or if the condition fails to improve as anticipated.  I provided 20 minutes of face-to-face time during this encounter and > 50% was spent counseling as documented under my assessment and plan.   Additional time (5-10 minutes) was spent coordinating care with her surgeon and speaking with the patient again after her appointment today.   Lequita Asal, MD, PhD    09/14/2018, 9:24 AM  I, Molly Dorshimer, am acting as Education administrator for [employer]. Mike Gip, MD, PhD.  I, Onofre Gains C. Mike Gip, MD, have reviewed the above documentation for accuracy and completeness, and I agree with the above.

## 2018-09-14 ENCOUNTER — Encounter: Payer: Self-pay | Admitting: Hematology and Oncology

## 2018-09-14 ENCOUNTER — Other Ambulatory Visit: Payer: Self-pay

## 2018-09-14 ENCOUNTER — Inpatient Hospital Stay (HOSPITAL_BASED_OUTPATIENT_CLINIC_OR_DEPARTMENT_OTHER): Payer: Medicare Other | Admitting: Hematology and Oncology

## 2018-09-14 ENCOUNTER — Inpatient Hospital Stay: Payer: Medicare Other

## 2018-09-14 VITALS — BP 123/80 | HR 71 | Temp 98.6°F | Resp 18 | Ht 68.0 in | Wt 146.8 lb

## 2018-09-14 DIAGNOSIS — Z87891 Personal history of nicotine dependence: Secondary | ICD-10-CM

## 2018-09-14 DIAGNOSIS — Z171 Estrogen receptor negative status [ER-]: Secondary | ICD-10-CM

## 2018-09-14 DIAGNOSIS — D696 Thrombocytopenia, unspecified: Secondary | ICD-10-CM

## 2018-09-14 DIAGNOSIS — R17 Unspecified jaundice: Secondary | ICD-10-CM

## 2018-09-14 DIAGNOSIS — N6489 Other specified disorders of breast: Secondary | ICD-10-CM

## 2018-09-14 DIAGNOSIS — Z7189 Other specified counseling: Secondary | ICD-10-CM

## 2018-09-14 DIAGNOSIS — Z923 Personal history of irradiation: Secondary | ICD-10-CM

## 2018-09-14 DIAGNOSIS — C50911 Malignant neoplasm of unspecified site of right female breast: Secondary | ICD-10-CM | POA: Diagnosis not present

## 2018-09-14 DIAGNOSIS — R197 Diarrhea, unspecified: Secondary | ICD-10-CM

## 2018-09-14 DIAGNOSIS — Z801 Family history of malignant neoplasm of trachea, bronchus and lung: Secondary | ICD-10-CM

## 2018-09-14 LAB — CBC WITH DIFFERENTIAL/PLATELET
Abs Immature Granulocytes: 0.01 10*3/uL (ref 0.00–0.07)
Basophils Absolute: 0 10*3/uL (ref 0.0–0.1)
Basophils Relative: 1 %
Eosinophils Absolute: 0.2 10*3/uL (ref 0.0–0.5)
Eosinophils Relative: 6 %
HCT: 37.9 % (ref 36.0–46.0)
Hemoglobin: 13.1 g/dL (ref 12.0–15.0)
Immature Granulocytes: 0 %
Lymphocytes Relative: 22 %
Lymphs Abs: 0.8 10*3/uL (ref 0.7–4.0)
MCH: 34.2 pg — ABNORMAL HIGH (ref 26.0–34.0)
MCHC: 34.6 g/dL (ref 30.0–36.0)
MCV: 99 fL (ref 80.0–100.0)
Monocytes Absolute: 0.4 10*3/uL (ref 0.1–1.0)
Monocytes Relative: 11 %
Neutro Abs: 2.1 10*3/uL (ref 1.7–7.7)
Neutrophils Relative %: 60 %
Platelets: 133 10*3/uL — ABNORMAL LOW (ref 150–400)
RBC: 3.83 MIL/uL — ABNORMAL LOW (ref 3.87–5.11)
RDW: 14.6 % (ref 11.5–15.5)
WBC: 3.5 10*3/uL — ABNORMAL LOW (ref 4.0–10.5)
nRBC: 0 % (ref 0.0–0.2)

## 2018-09-14 LAB — COMPREHENSIVE METABOLIC PANEL
ALT: 26 U/L (ref 0–44)
AST: 22 U/L (ref 15–41)
Albumin: 4.4 g/dL (ref 3.5–5.0)
Alkaline Phosphatase: 48 U/L (ref 38–126)
Anion gap: 10 (ref 5–15)
BUN: 14 mg/dL (ref 8–23)
CO2: 25 mmol/L (ref 22–32)
Calcium: 9.3 mg/dL (ref 8.9–10.3)
Chloride: 100 mmol/L (ref 98–111)
Creatinine, Ser: 0.74 mg/dL (ref 0.44–1.00)
GFR calc Af Amer: >60 mL/min (ref >60)
GFR calc non Af Amer: >60 mL/min (ref >60)
Glucose, Bld: 132 mg/dL — ABNORMAL HIGH (ref 70–99)
Potassium: 3.6 mmol/L (ref 3.5–5.1)
Sodium: 135 mmol/L (ref 135–145)
Total Bilirubin: 1.2 mg/dL (ref 0.3–1.2)
Total Protein: 6.8 g/dL (ref 6.5–8.1)

## 2018-09-14 LAB — MAGNESIUM: Magnesium: 2 mg/dL (ref 1.7–2.4)

## 2018-09-14 LAB — BILIRUBIN, DIRECT: Bilirubin, Direct: 0.1 mg/dL (ref 0.0–0.2)

## 2018-09-14 MED ORDER — HEPARIN SOD (PORK) LOCK FLUSH 100 UNIT/ML IV SOLN
500.0000 [IU] | Freq: Once | INTRAVENOUS | Status: AC
  Administered 2018-09-14: 500 [IU] via INTRAVENOUS

## 2018-09-14 MED ORDER — SODIUM CHLORIDE 0.9% FLUSH
10.0000 mL | INTRAVENOUS | Status: DC | PRN
  Administered 2018-09-14: 09:00:00 10 mL via INTRAVENOUS
  Filled 2018-09-14: qty 10

## 2018-09-14 NOTE — Progress Notes (Signed)
No new changes noted today 

## 2018-10-04 NOTE — Progress Notes (Signed)
[facility]  [address] [place], [place] [address] Phone: [phone]  Fax: [phone]   Clinic Day:  10/05/2018  Referring physician: Baxter Hire, MD  Chief Complaint: Stephanie Sanders is a 73 y.o. 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  HPI: The patient was last seen in the medical oncology clinic on 09/14/2018. At that time, she was doing well.  Exam revealed an eschar (10 x 5 mm) overlying her seroma. Hemoglobin was 13.1.  Platelets were 133,000.  WBC 3500 ([place] 2100).   Cycle #3 of Xeloda was initiated on 09/15/2018. Xeloda was decreased to 1500 mg BID secondary to poor tolerance. Cycle #3 was completed 09/28/2018.   During the interim, she is doing "pretty good." She reports 2-3 episodes of diarrhea, resolved with Imodium. Her bowels have been normal the past 2-3 days. She has urgency of urination and reports a discontinuous stream, with increased time needed to relieve herself. She denies any pressure or pain with urination. She denies any change in frequency of urination. Symptoms resolve 3 days after Xeloda is stopped.   The seroma on her right breast has no drainage. It has scabbed over. She denies any increase in size.    Past Medical History:  Diagnosis Date  . Cancer ([place]) 04/2017   right breast  . Diabetes mellitus without complication ([place])   . Diabetic nephropathy ([place])   . Headache    migraines  . Heart murmur    ASYMPTOMATIC  . Hypertension   . Hypothyroidism   . Personal history of chemotherapy 2019   right breast cancer  . Pneumonia   . Seborrheic keratosis   . Vitamin D deficiency     Past Surgical History:  Procedure Laterality Date  . APPENDECTOMY    . AXILLARY LYMPH NODE DISSECTION Right 02/01/2018   Procedure: AXILLARY LYMPH NODE DISSECTION;  Surgeon: Herbert Pun, MD;  Location: [facility] ORS;  Service:  General;  Laterality: Right;  . BREAST BIOPSY Right 05/28/2017   right breast bx 3 areas invasive mamm ca lymph node mets  . BREAST CYST ASPIRATION Bilateral    neg  . BREAST LUMPECTOMY Right 02/01/2018  . CHOLECYSTECTOMY    . COLONOSCOPY WITH PROPOFOL N/A 01/01/2015   Procedure: COLONOSCOPY WITH PROPOFOL;  Surgeon: Josefine Class, MD;  Location: [facility] ENDOSCOPY;  Service: Endoscopy;  Laterality: N/A;  . EYE SURGERY Left    eyelid  . PARTIAL MASTECTOMY WITH NEEDLE LOCALIZATION Right 02/01/2018   Procedure: PARTIAL MASTECTOMY WITH NEEDLE LOCALIZATION;  Surgeon: Herbert Pun, MD;  Location: [facility] ORS;  Service: General;  Laterality: Right;  . PORTACATH PLACEMENT Right 06/10/2017   Procedure: INSERTION PORT-A-CATH;  Surgeon: Herbert Pun, MD;  Location: [facility] ORS;  Service: General;  Laterality: Right;  . SENTINEL NODE BIOPSY Right 02/01/2018   Procedure: SENTINEL NODE BIOPSY;  Surgeon: Herbert Pun, MD;  Location: [facility] ORS;  Service: General;  Laterality: Right;  . STENT PLACEMENT ILIAC ([place] HX)     temporary placement s/p cholecystectomy  . TUBAL LIGATION      Family History  Problem Relation Age of Onset  . Parkinson's disease Mother   . Cancer Maternal Aunt   . Cancer Father   . Lung cancer Father   . Breast cancer Neg Hx     Social History:  reports that she quit smoking about 46 years ago. Her smoking use included cigarettes. She has  a 20.00 pack-year smoking history. She has never used smokeless tobacco. She reports that she does not drink alcohol or use drugs. She has 3 children (daughters: age 71 and 73; son Jaquelyn Bitter): age 1). She is retired. She worked for the [employer]. Her husband has dementia. She lives in [place]. She is alone today.  Allergies:  Allergies  Allergen Reactions  . Fiorinal [Butalbital-Aspirin-Caffeine] Nausea And Vomiting and Other (See Comments)    SEVERE HEADACHE  . Sulfa Antibiotics Other (See Comments)    Burning  esophagus and stomach  . Tramadol Nausea And Vomiting  . Other Rash    STERI-STRIPS-RASH    Current Medications: Current Outpatient Medications  Medication Sig Dispense Refill  . acetaminophen (TYLENOL) 500 MG tablet Take 1,000 mg by mouth every 6 (six) hours as needed for moderate pain or headache.     Marland Kitchen atorvastatin (LIPITOR) 10 MG tablet Take 10 mg by mouth every evening.     . Cholecalciferol 2000 UNITS CAPS Take 4,000 Units by mouth daily.     . Lactobacillus (PROBIOTIC ACIDOPHILUS PO) Take 1 capsule by mouth daily.     Marland Kitchen levothyroxine (SYNTHROID, LEVOTHROID) 50 MCG tablet Take 50 mcg by mouth daily before breakfast.    . lidocaine-prilocaine (EMLA) cream Apply 1 application topically as needed. 30 g 6  . MAGNESIUM GLYCINATE PLUS PO Take 1 tablet by mouth at bedtime.     . metFORMIN (GLUCOPHAGE) 1000 MG tablet Take 1,000 mg by mouth 2 (two) times daily with a meal.    . quinapril (ACCUPRIL) 5 MG tablet Take 5 mg by mouth every morning.     . sodium bicarbonate 650 MG tablet Take 1,300 mg by mouth at bedtime.    . capecitabine (XELODA) 500 MG tablet Take 3 tablets (1,500 mg total) by mouth 2 (two) times daily after a meal. Take with two 150mg  tablets. Take for 14days, then hold for 7days (Patient not taking: Reported on 09/14/2018) 84 tablet 1   No current facility-administered medications for this visit.    Facility-Administered Medications Ordered in Other Visits  Medication Dose Route Frequency Provider Last Rate Last Dose  . sodium chloride flush (NS) 0.9 % injection 10 mL  10 mL Intravenous PRN Lequita Asal, MD   10 mL at 10/21/17 0841    Review of Systems  Constitutional: Negative for chills, diaphoresis, fever, malaise/fatigue and weight loss (stable).       Feels "pretty good".  HENT: Negative.  Negative for congestion, ear discharge, ear pain, nosebleeds, sinus pain, sore throat and tinnitus.   Eyes: Negative for blurred vision, double vision, photophobia, pain and  discharge.  Respiratory: Negative.  Negative for hemoptysis, sputum production and shortness of breath.   Cardiovascular: Negative.  Negative for palpitations, orthopnea, leg swelling and PND.  Gastrointestinal: Positive for diarrhea (2-3 episodes, resolved with Imodium). Negative for abdominal pain, blood in stool, constipation, heartburn, melena, nausea and vomiting.  Genitourinary: Positive for urgency. Negative for dysuria, frequency and hematuria.  Musculoskeletal: Negative for back pain, falls, joint pain, myalgias and neck pain.  Skin: Negative.  Negative for itching and rash.       No palmar or plantar desquamation.  History of chronic drainage from seroma, now scabbed over.  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 Negative for dizziness, tingling, tremors, speech change, focal weakness, weakness and headaches.  Endo/Heme/Allergies: Does not bruise/bleed easily.       Diabetes.  Psychiatric/Behavioral: Negative.  Negative  for depression and memory loss. The patient is not nervous/anxious and does not have insomnia.   All other systems reviewed and are negative.  Performance status (ECOG): 1  Blood pressure 116/68, pulse 80, temperature 98.6 F (37 C), temperature source Oral, resp. rate 18, weight 146 lb 7.9 oz (66.4 kg), SpO2 100 %.   Physical Exam  Constitutional: She is oriented to person, place, and time. Vital signs are normal. She appears well-developed and well-nourished. No distress.  HENT:  Head: Normocephalic and atraumatic.  Mouth/Throat: Oropharynx is clear and moist and mucous membranes are normal. No oropharyngeal exudate.  Short gray/silver hair. Mask.  Eyes: Pupils are equal, round, and reactive to light. Conjunctivae and EOM are normal. No scleral icterus.  Blue eyes.   Neck: Normal range of motion. Neck supple. No JVD present.  Cardiovascular: Normal rate, regular rhythm and normal heart sounds.  No  murmur heard. Pulmonary/Chest: Effort normal. No respiratory distress. She has no wheezes. She has no rales. Right breast exhibits skin change (2-3 mm eschar overlying seroma).  Abdominal: Soft. Normal appearance and bowel sounds are normal. She exhibits no distension and no mass. There is no abdominal tenderness. There is no rebound and no guarding.  Musculoskeletal: Normal range of motion.        General: No edema.  Lymphadenopathy:    She has no cervical adenopathy.    She has no axillary adenopathy.       Right: No supraclavicular adenopathy present.       Left: No supraclavicular adenopathy present.  Neurological: She is alert and oriented to person, place, and time.  Skin: Skin is warm and dry. No rash noted. She is not diaphoretic. No erythema. No pallor.  No palmar or plantar erythema or desqumatation.   Psychiatric: She has a normal mood and affect. Her behavior is normal. Judgment and thought content normal.  Nursing note and vitals reviewed.   Appointment on 10/05/2018  Component Date Value Ref Range Status  . Sodium 10/05/2018 134* 135 - 145 mmol/L Final  . Potassium 10/05/2018 3.8  3.5 - 5.1 mmol/L Final  . Chloride 10/05/2018 98  98 - 111 mmol/L Final  . CO2 10/05/2018 26  22 - 32 mmol/L Final  . Glucose, Bld 10/05/2018 169* 70 - 99 mg/dL Final  . BUN 10/05/2018 10  8 - 23 mg/dL Final  . Creatinine, Ser 10/05/2018 0.89  0.44 - 1.00 mg/dL Final  . Calcium 10/05/2018 9.6  8.9 - 10.3 mg/dL Final  . Total Protein 10/05/2018 7.0  6.5 - 8.1 g/dL Final  . Albumin 10/05/2018 4.5  3.5 - 5.0 g/dL Final  . AST 10/05/2018 24  15 - 41 U/L Final  . ALT 10/05/2018 25  0 - 44 U/L Final  . Alkaline Phosphatase 10/05/2018 50  38 - 126 U/L Final  . Total Bilirubin 10/05/2018 1.5* 0.3 - 1.2 mg/dL Final  . GFR calc non Af Amer 10/05/2018 >60  >60 mL/min Final  . GFR calc Af Amer 10/05/2018 >60  >60 mL/min Final  . Anion gap 10/05/2018 10  5 - 15 Final   Performed at [facility], [address], [place], [place] [address]  . WBC 10/05/2018 3.0* 4.0 - 10.5 K/uL Final  . RBC 10/05/2018 3.74* 3.87 - 5.11 MIL/uL Final  . Hemoglobin 10/05/2018 13.0  12.0 - 15.0 g/dL Final  . HCT 10/05/2018 37.5  36.0 - 46.0 % Final  . MCV 10/05/2018 100.3* 80.0 - 100.0 fL Final  .  MCH 10/05/2018 34.8* 26.0 - 34.0 pg Final  . MCHC 10/05/2018 34.7  30.0 - 36.0 g/dL Final  . RDW 10/05/2018 16.0* 11.5 - 15.5 % Final  . Platelets 10/05/2018 140* 150 - 400 K/uL Final  . nRBC 10/05/2018 0.0  0.0 - 0.2 % Final  . Neutrophils Relative % 10/05/2018 59  % Final  . Neutro Abs 10/05/2018 1.8  1.7 - 7.7 K/uL Final  . Lymphocytes Relative 10/05/2018 22  % Final  . Lymphs Abs 10/05/2018 0.7  0.7 - 4.0 K/uL Final  . Monocytes Relative 10/05/2018 12  % Final  . Monocytes Absolute 10/05/2018 0.4  0.1 - 1.0 K/uL Final  . Eosinophils Relative 10/05/2018 5  % Final  . Eosinophils Absolute 10/05/2018 0.1  0.0 - 0.5 K/uL Final  . Basophils Relative 10/05/2018 1  % Final  . Basophils Absolute 10/05/2018 0.0  0.0 - 0.1 K/uL Final  . Immature Granulocytes 10/05/2018 1  % Final  . Abs Immature Granulocytes 10/05/2018 0.02  0.00 - 0.07 K/uL Final   Performed at [facility], [address], [place], [place] [address]    Assessment:  Stephanie Sanders is a 73 y.o. female with multi-focal triple negative clinical stage T1cN1Mx right breast cancers/p biopsy on 05/28/2017. Pathologyrevealed two grade II invasive mammary carcinomas of no special type (4 cm from the nipple and 5 cm from the nipple). Tumors were in close proximity. Lymph node biopsy confirmed metastatic carcinoma of breast origin. Tumor is ER negative (<1%), PR negative and Her 2/neu 2+ by IHC. [facility] genetic testingon 07/21/2017 revealed a variant of uncertain significance in RECQL4.  Right sided mammogram and ultrasound on 05/21/2017 revealed 2 highly suspicious masses in the right breast at the 10:30 position 4 cm from  nipple (1.8 x 1.2 x 1.1 cm) and at the 10:30 position 5 cm from nipple (1.1 x 0.9 x 0.9 cm). There was a suspicious 2.7 x 1 x 2.2 cm lymph node in the right axilla with asymmetric nodular cortical thickening.  Ultrasound guided biopsyof the 2 masses and lymph node on 05/28/2017 revealed grade II invasive mammary carcinoma of no special type (4 cm from the nipple and 5 cm from the nipple). Lymph node biopsy revealed metastatic carcinoma of breast origin. Tumor is ER negative (<1%), PR negative and Her 2/neu 2+ by IHC. FISH was negative.   CA 27.29has been followed: 16.5 on 06/04/2017 and 14.3 on 05/21/2018. CA15-3was 17.6 on 06/04/2017.  E[place]oon 06/09/2017 revealed an EF of 60-65%. She has enrolled on the UPBEAT clinical trial.  She received 4 cycles of AC(06/23/2017 - 09/08/2017) with Margarette Canada support. Cycle #1 was complicated by influenza A. Nadir counts included an ANC of 0 and platelet count of 71,000. Cycle #2 postponed due to slow recovery from influenza and excisional cyst removal with surrounding cellulitis (treated with 10 days of doxycycline).   She received 12 weeks of Taxol (09/29/2017 - 01/05/2018). She received GCSFafter week #3 and 5 Taxol secondary to borderline counts. Cycle #5 was postponed on 10/29/2017 due to patient's wishes to be treated on Tuesdays only. Week #7 Taxol was held secondary to neutropenia (ANC 200).  Right mammogram and ultrasoundon 09/28/2017 revealed the 2 adjacent masses in the right breast at the 10:30 o'clock position had both decreased in size (1.8 x 1.1 x 1.2 cm to 1.2 x 0.7 x 1.1 cm; 11 x 9 x 9 mm to 7 x 6 x 6 mm) as well as the metastatic right axillary lymph  node (2.7 cm to 1.6 x 0.9 x 1.7 cm).   Right partial mastectomyand sentinel lymph node biopsy on 02/01/2018 Pathology revealed 10 mm grade II invasive carcinoma of no special type. The tumor bed encompassed the biopsy sites, with foci of residual carcinoma at both biopsy sites  and also involving the tissue between the biopsy sites. Foci of residual carcinoma range from 1 to 10 mm.There was no DCIS. Distance from closest margin was 1 mm. Four of 4 lymph nodes were negative. Pathologic stage was ypT1b (m) ypN0 (sn).  She received breast radiationfrom 03/10/2018 - 05/07/2018.  She is s/p cycle #3Xeloda(07/06/2018 - 09/15/2018).  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 Normal labs on 11/24/2017 included: B12, folate, TSH, and copper.  Bone densityon 07/01/2018 was normal witha T score of -0.8 and the RIGHTfemoral neck.  [facility] with with fever, cough and generalized weakness. Nasal swab was + forinfluenza A. She completed a course of Tamiflu.  Symptomatically, she is doing well.  She notes diarrhea managed with minimal Imodium.  She has a small eschar overlying her seroma.  Bilirubin is 1.5 (direct 0.3).  Plan: 1.   Labs today:CBC with diff, CMP. 2.   Multifocal triple negative RIGHT breast cancer Clinically she is doing well. She has tolerated 3cycles ofXeloda.  She appears to have tolerated cycle #3 better at reduced dose (1500 mg BID 2 weeks on/1 week off). Review plan for 8 cycles of adjuvant Xeloda. Begin cycle #4 Xeloda tomorrow. Discuss symptom management.  Shehas antiemetics and pain medications at home to use on a prn bases.  Interventions are adequate. 3.   Diarrhea  Diarrhea is minimal on current dose of Xeloda. Patient to contact clinic if any concerns. 4.Right breast seroma Patient has a history of chronic drainage. She has a thin eschar overlying this area.             Follow-up as needed with Dr Windell Moment if drainage recurs. 5.   RTC in 2 weeks for labs (CBC with diff). 6.   RTC in 3 weeks for MD assess and labs (CBC with diff, CMP, Mg)..    I discussed the assessment and treatment plan with the patient.  The patient was  provided an opportunity to ask questions and all were answered.  The patient agreed with the plan and demonstrated an understanding of the instructions.  The patient was advised to call back if the symptoms worsen or if the condition fails to improve as anticipated.  I provided 15 minutes of face-to-face time during this this encounter and > 50% was spent counseling as documented under my assessment and plan.    Lequita Asal, MD, PhD    10/05/2018, 10:30 AM  I, Molly Dorshimer, am acting as Education administrator for [employer]. Mike Gip, MD, PhD.  I, Melissa C. Mike Gip, MD, have reviewed the above documentation for accuracy and completeness, and I agree with the above.

## 2018-10-05 ENCOUNTER — Other Ambulatory Visit: Payer: Self-pay

## 2018-10-05 ENCOUNTER — Inpatient Hospital Stay: Payer: Medicare Other | Attending: Hematology and Oncology | Admitting: Hematology and Oncology

## 2018-10-05 ENCOUNTER — Inpatient Hospital Stay: Payer: Medicare Other

## 2018-10-05 ENCOUNTER — Encounter: Payer: Self-pay | Admitting: Hematology and Oncology

## 2018-10-05 VITALS — BP 116/68 | HR 80 | Temp 98.6°F | Resp 18 | Wt 146.5 lb

## 2018-10-05 DIAGNOSIS — R39198 Other difficulties with micturition: Secondary | ICD-10-CM | POA: Diagnosis not present

## 2018-10-05 DIAGNOSIS — Z9012 Acquired absence of left breast and nipple: Secondary | ICD-10-CM | POA: Insufficient documentation

## 2018-10-05 DIAGNOSIS — R197 Diarrhea, unspecified: Secondary | ICD-10-CM

## 2018-10-05 DIAGNOSIS — N6489 Other specified disorders of breast: Secondary | ICD-10-CM | POA: Diagnosis not present

## 2018-10-05 DIAGNOSIS — R3915 Urgency of urination: Secondary | ICD-10-CM

## 2018-10-05 DIAGNOSIS — Z171 Estrogen receptor negative status [ER-]: Secondary | ICD-10-CM

## 2018-10-05 DIAGNOSIS — C50911 Malignant neoplasm of unspecified site of right female breast: Secondary | ICD-10-CM

## 2018-10-05 DIAGNOSIS — R17 Unspecified jaundice: Secondary | ICD-10-CM

## 2018-10-05 DIAGNOSIS — Z79899 Other long term (current) drug therapy: Secondary | ICD-10-CM | POA: Diagnosis not present

## 2018-10-05 DIAGNOSIS — Z923 Personal history of irradiation: Secondary | ICD-10-CM | POA: Insufficient documentation

## 2018-10-05 LAB — CBC WITH DIFFERENTIAL/PLATELET
Abs Immature Granulocytes: 0.02 10*3/uL (ref 0.00–0.07)
Basophils Absolute: 0 10*3/uL (ref 0.0–0.1)
Basophils Relative: 1 %
Eosinophils Absolute: 0.1 10*3/uL (ref 0.0–0.5)
Eosinophils Relative: 5 %
HCT: 37.5 % (ref 36.0–46.0)
Hemoglobin: 13 g/dL (ref 12.0–15.0)
Immature Granulocytes: 1 %
Lymphocytes Relative: 22 %
Lymphs Abs: 0.7 10*3/uL (ref 0.7–4.0)
MCH: 34.8 pg — ABNORMAL HIGH (ref 26.0–34.0)
MCHC: 34.7 g/dL (ref 30.0–36.0)
MCV: 100.3 fL — ABNORMAL HIGH (ref 80.0–100.0)
Monocytes Absolute: 0.4 10*3/uL (ref 0.1–1.0)
Monocytes Relative: 12 %
Neutro Abs: 1.8 10*3/uL (ref 1.7–7.7)
Neutrophils Relative %: 59 %
Platelets: 140 10*3/uL — ABNORMAL LOW (ref 150–400)
RBC: 3.74 MIL/uL — ABNORMAL LOW (ref 3.87–5.11)
RDW: 16 % — ABNORMAL HIGH (ref 11.5–15.5)
WBC: 3 10*3/uL — ABNORMAL LOW (ref 4.0–10.5)
nRBC: 0 % (ref 0.0–0.2)

## 2018-10-05 LAB — COMPREHENSIVE METABOLIC PANEL
ALT: 25 U/L (ref 0–44)
AST: 24 U/L (ref 15–41)
Albumin: 4.5 g/dL (ref 3.5–5.0)
Alkaline Phosphatase: 50 U/L (ref 38–126)
Anion gap: 10 (ref 5–15)
BUN: 10 mg/dL (ref 8–23)
CO2: 26 mmol/L (ref 22–32)
Calcium: 9.6 mg/dL (ref 8.9–10.3)
Chloride: 98 mmol/L (ref 98–111)
Creatinine, Ser: 0.89 mg/dL (ref 0.44–1.00)
GFR calc Af Amer: >60 mL/min (ref >60)
GFR calc non Af Amer: >60 mL/min (ref >60)
Glucose, Bld: 169 mg/dL — ABNORMAL HIGH (ref 70–99)
Potassium: 3.8 mmol/L (ref 3.5–5.1)
Sodium: 134 mmol/L — ABNORMAL LOW (ref 135–145)
Total Bilirubin: 1.5 mg/dL — ABNORMAL HIGH (ref 0.3–1.2)
Total Protein: 7 g/dL (ref 6.5–8.1)

## 2018-10-05 LAB — BILIRUBIN, DIRECT: Bilirubin, Direct: 0.3 mg/dL — ABNORMAL HIGH (ref 0.0–0.2)

## 2018-10-05 NOTE — Progress Notes (Signed)
Pt here for follow up. Reports she is in holding pattern with Xeloda. Reports when she is taking Xeloda she had changes in urinary habits. Reports she has sudden urges to urinate and when she does, the stream stops and goes. Also reports some episodes of diarrhea. Patient states some visual changes as well but reports it has improved her distant vision.

## 2018-10-15 ENCOUNTER — Encounter: Payer: Self-pay | Admitting: Hematology and Oncology

## 2018-10-19 ENCOUNTER — Other Ambulatory Visit: Payer: Self-pay

## 2018-10-19 ENCOUNTER — Inpatient Hospital Stay: Payer: Medicare Other

## 2018-10-19 DIAGNOSIS — C50911 Malignant neoplasm of unspecified site of right female breast: Secondary | ICD-10-CM | POA: Diagnosis not present

## 2018-10-19 DIAGNOSIS — Z171 Estrogen receptor negative status [ER-]: Secondary | ICD-10-CM

## 2018-10-19 LAB — CBC WITH DIFFERENTIAL/PLATELET
Abs Immature Granulocytes: 0.02 10*3/uL (ref 0.00–0.07)
Basophils Absolute: 0 10*3/uL (ref 0.0–0.1)
Basophils Relative: 1 %
Eosinophils Absolute: 0.1 10*3/uL (ref 0.0–0.5)
Eosinophils Relative: 4 %
HCT: 35.1 % — ABNORMAL LOW (ref 36.0–46.0)
Hemoglobin: 12.3 g/dL (ref 12.0–15.0)
Immature Granulocytes: 1 %
Lymphocytes Relative: 24 %
Lymphs Abs: 0.8 10*3/uL (ref 0.7–4.0)
MCH: 35.1 pg — ABNORMAL HIGH (ref 26.0–34.0)
MCHC: 35 g/dL (ref 30.0–36.0)
MCV: 100.3 fL — ABNORMAL HIGH (ref 80.0–100.0)
Monocytes Absolute: 0.4 10*3/uL (ref 0.1–1.0)
Monocytes Relative: 11 %
Neutro Abs: 1.9 10*3/uL (ref 1.7–7.7)
Neutrophils Relative %: 59 %
Platelets: 131 10*3/uL — ABNORMAL LOW (ref 150–400)
RBC: 3.5 MIL/uL — ABNORMAL LOW (ref 3.87–5.11)
RDW: 16.1 % — ABNORMAL HIGH (ref 11.5–15.5)
WBC: 3.2 10*3/uL — ABNORMAL LOW (ref 4.0–10.5)
nRBC: 0 % (ref 0.0–0.2)

## 2018-10-24 DIAGNOSIS — R17 Unspecified jaundice: Secondary | ICD-10-CM | POA: Insufficient documentation

## 2018-10-25 NOTE — Progress Notes (Signed)
[facility]  [address] [place], [place] [address] Phone: [phone]  Fax: [phone]   Clinic Day:  10/26/2018  Referring physician: Baxter Hire, MD  Chief Complaint: Stephanie Sanders is a 73 y.o. 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   HPI: The patient was last seen in the medical oncology clinic on 10/05/2018. At that time, she was doing well. She noted diarrhea managed with minimal Imodium. She had a small eschar overlying her seroma. Bilirubin was 1.5 (direct 0.3).  Lab on 10/19/2018: WBC 3,200, hemoglobin 12.3, hematocrit 35.1, platelets 131,000.  During the interim, the patient notes "I'm doing good". She reports diarrhea for x 2 to 3 days, and no bowel movement for x 2 to 3 days. Her bowel movement are then back to normal. She is not taking any Imodium. She has intermittent fatigue and she will take 2 or 3 hour naps. After naps, she notes feeling better. She notes numbness in her finger tips, and at night her feet burn and she uses cotton socks. She is using moisturizer daily. Her seroma  leaks small amounts clear fluid. She denies any new symptoms.   Past Medical History:  Diagnosis Date  . Cancer ([place]) 04/2017   right breast  . Diabetes mellitus without complication ([place])   . Diabetic nephropathy ([place])   . Headache    migraines  . Heart murmur    ASYMPTOMATIC  . Hypertension   . Hypothyroidism   . Personal history of chemotherapy 2019   right breast cancer  . Pneumonia   . Seborrheic keratosis   . Vitamin D deficiency     Past Surgical History:  Procedure Laterality Date  . APPENDECTOMY    . AXILLARY LYMPH NODE DISSECTION Right 02/01/2018   Procedure: AXILLARY LYMPH NODE DISSECTION;  Surgeon: Herbert Pun, MD;  Location: [facility] ORS;  Service: General;  Laterality: Right;  . BREAST BIOPSY Right 05/28/2017   right breast bx 3  areas invasive mamm ca lymph node mets  . BREAST CYST ASPIRATION Bilateral    neg  . BREAST LUMPECTOMY Right 02/01/2018  . CHOLECYSTECTOMY    . COLONOSCOPY WITH PROPOFOL N/A 01/01/2015   Procedure: COLONOSCOPY WITH PROPOFOL;  Surgeon: Josefine Class, MD;  Location: [facility] END[facility]COPY;  Service: Endoscopy;  Laterality: N/A;  . EYE SURGERY Left    eyelid  . PARTIAL MASTECTOMY WITH NEEDLE LOCALIZATION Right 02/01/2018   Procedure: PARTIAL MASTECTOMY WITH NEEDLE LOCALIZATION;  Surgeon: Herbert Pun, MD;  Location: [facility] ORS;  Service: General;  Laterality: Right;  . PORTACATH PLACEMENT Right 06/10/2017   Procedure: INSERTION PORT-A-CATH;  Surgeon: Herbert Pun, MD;  Location: [facility] ORS;  Service: General;  Laterality: Right;  . SENTINEL NODE BIOPSY Right 02/01/2018   Procedure: SENTINEL NODE BIOPSY;  Surgeon: Herbert Pun, MD;  Location: [facility] ORS;  Service: General;  Laterality: Right;  . STENT PLACEMENT ILIAC ([place] HX)     temporary placement s/p cholecystectomy  . TUBAL LIGATION      Family History  Problem Relation Age of Onset  . Parkinson's disease Mother   . Cancer Maternal Aunt   . Cancer Father   . Lung cancer Father   . Breast cancer Neg Hx     Social History:  reports that she quit smoking about 46 years ago. Her smoking use included cigarettes. She has a 20.00 pack-year smoking history. She has never used smokeless tobacco. She  reports that she does not drink alcohol or use drugs. She has 3 children (daughters: age 61 and 99; son Jaquelyn Bitter): age 98). She is retired. She worked for the [employer]. Her husband has dementia. She lives in [place]. The patient is alone today.  Allergies:  Allergies  Allergen Reactions  . Fiorinal [Butalbital-Aspirin-Caffeine] Nausea And Vomiting and Other (See Comments)    SEVERE HEADACHE  . Sulfa Antibiotics Other (See Comments)    Burning esophagus and stomach  . Tramadol Nausea And Vomiting  . Other Rash     STERI-STRIPS-RASH    Current Medications: Current Outpatient Medications  Medication Sig Dispense Refill  . acetaminophen (TYLENOL) 500 MG tablet Take 1,000 mg by mouth every 6 (six) hours as needed for moderate pain or headache.     Marland Kitchen atorvastatin (LIPITOR) 10 MG tablet Take 10 mg by mouth every evening.     . capecitabine (XELODA) 500 MG tablet Take 3 tablets (1,500 mg total) by mouth 2 (two) times daily after a meal. Take with two 150mg  tablets. Take for 14days, then hold for 7days 84 tablet 1  . Cholecalciferol 2000 UNITS CAPS Take 4,000 Units by mouth daily.     . Lactobacillus (PROBIOTIC ACIDOPHILUS PO) Take 1 capsule by mouth daily.     Marland Kitchen levothyroxine (SYNTHROID, LEVOTHROID) 50 MCG tablet Take 50 mcg by mouth daily before breakfast.    . lidocaine-prilocaine (EMLA) cream Apply 1 application topically as needed. 30 g 6  . MAGNESIUM GLYCINATE PLUS PO Take 1 tablet by mouth at bedtime.     . metFORMIN (GLUCOPHAGE) 1000 MG tablet Take 1,000 mg by mouth 2 (two) times daily with a meal.    . quinapril (ACCUPRIL) 5 MG tablet Take 5 mg by mouth every morning.     . sodium bicarbonate 650 MG tablet Take 1,300 mg by mouth at bedtime.     No current facility-administered medications for this visit.    Facility-Administered Medications Ordered in Other Visits  Medication Dose Route Frequency Provider Last Rate Last Dose  . sodium chloride flush (NS) 0.9 % injection 10 mL  10 mL Intravenous PRN Lequita Asal, MD   10 mL at 10/21/17 0841    Review of Systems  Constitutional: Positive for malaise/fatigue (comes and goes). Negative for chills, diaphoresis, fever and weight loss (stable).       "I'm doing good". Takes 2-3 hour naps daily.  HENT: Negative.  Negative for congestion, ear discharge, ear pain, nosebleeds, sinus pain, sore throat and tinnitus.   Eyes: Negative for blurred vision, double vision, photophobia, pain and discharge.  Respiratory: Negative.  Negative for hemoptysis,  sputum production and shortness of breath.   Cardiovascular: Negative.  Negative for palpitations, orthopnea, leg swelling and PND.  Gastrointestinal: Positive for diarrhea (2-3 episodes, resolved with Imodium). Negative for abdominal pain, blood in stool, constipation, heartburn, melena, nausea and vomiting.  Genitourinary: Negative for dysuria, frequency, hematuria and urgency.  Musculoskeletal: Negative for back pain, falls, joint pain, myalgias and neck pain.  Skin: Negative.  Negative for itching and rash.       No palmar or plantar desquamation.  History of chronic drainage from seroma, now scabbed over.  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 Negative for dizziness, tingling, tremors, speech change, focal weakness, weakness and headaches.       Numbness in finger tips.  Endo/Heme/Allergies: Does not bruise/bleed easily.       Diabetes.  Psychiatric/Behavioral: Negative.  Negative for depression and memory loss. The patient is not nervous/anxious and does not have insomnia.   All other systems reviewed and are negative.  Performance status (ECOG): 1  Vital Signs Blood pressure 126/68, pulse 70, temperature (!) 97.4 F (36.3 C), temperature source Tympanic, resp. rate 16, weight 145 lb 1 oz (65.8 kg), SpO2 100 %.  Physical Exam  Constitutional: She is oriented to person, place, and time. Vital signs are normal. She appears well-developed and well-nourished. No distress.  HENT:  Head: Normocephalic and atraumatic.  Mouth/Throat: Oropharynx is clear and moist and mucous membranes are normal. No oropharyngeal exudate.  Short gray/silver hair. Mask.  Eyes: Pupils are equal, round, and reactive to light. Conjunctivae and EOM are normal. No scleral icterus.  Blue eyes.   Neck: Normal range of motion. Neck supple. No JVD present.  Cardiovascular: Normal rate, regular rhythm and normal heart sounds.  No murmur heard.  Pulmonary/Chest: Effort normal and breath sounds normal. No respiratory distress. She has no wheezes. She has no rales. Right breast exhibits skin change (2-3 mm eschar overlying seroma).  Abdominal: Soft. Normal appearance and bowel sounds are normal. She exhibits no distension and no mass. There is no abdominal tenderness. There is no rebound and no guarding.  Musculoskeletal: Normal range of motion.        General: No edema.  Lymphadenopathy:    She has no cervical adenopathy.    She has no axillary adenopathy.       Right: No supraclavicular adenopathy present.       Left: No supraclavicular adenopathy present.  Neurological: She is alert and oriented to person, place, and time. She has normal reflexes.  Skin: Skin is warm and dry. No rash noted. She is not diaphoretic. No erythema. No pallor.  No palmar or plantar erythema or desqumatation.   Psychiatric: She has a normal mood and affect. Her behavior is normal. Judgment and thought content normal.  Nursing note and vitals reviewed.   Infusion on 10/26/2018  Component Date Value Ref Range Status  . Magnesium 10/26/2018 2.1  1.7 - 2.4 mg/dL Final   Performed at [facility], [address] [place], [place] [address]  . Sodium 10/26/2018 134* 135 - 145 mmol/L Final  . Potassium 10/26/2018 3.7  3.5 - 5.1 mmol/L Final  . Chloride 10/26/2018 99  98 - 111 mmol/L Final  . CO2 10/26/2018 25  22 - 32 mmol/L Final  . Glucose, Bld 10/26/2018 120* 70 - 99 mg/dL Final  . BUN 10/26/2018 15  8 - 23 mg/dL Final  . Creatinine, Ser 10/26/2018 0.82  0.44 - 1.00 mg/dL Final  . Calcium 10/26/2018 9.4  8.9 - 10.3 mg/dL Final  . Total Protein 10/26/2018 6.9  6.5 - 8.1 g/dL Final  . Albumin 10/26/2018 4.5  3.5 - 5.0 g/dL Final  . AST 10/26/2018 27  15 - 41 U/L Final  . ALT 10/26/2018 23  0 - 44 U/L Final  . Alkaline Phosphatase 10/26/2018 52  38 - 126 U/L Final  . Total Bilirubin 10/26/2018 1.9* 0.3 - 1.2 mg/dL Final  . GFR calc non Af  Amer 10/26/2018 >60  >60 mL/min Final  . GFR calc Af Amer 10/26/2018 >60  >60 mL/min Final  . Anion gap 10/26/2018 10  5 - 15 Final   Performed at [facility], [address], [place],  [address]  . WBC 10/26/2018 3.2* 4.0 - 10.5 K/uL Final  . RBC 10/26/2018 3.40*  3.87 - 5.11 MIL/uL Final  . Hemoglobin 10/26/2018 12.3  12.0 - 15.0 g/dL Final  . HCT 10/26/2018 34.8* 36.0 - 46.0 % Final  . MCV 10/26/2018 102.4* 80.0 - 100.0 fL Final  . MCH 10/26/2018 36.2* 26.0 - 34.0 pg Final  . MCHC 10/26/2018 35.3  30.0 - 36.0 g/dL Final  . RDW 10/26/2018 17.8* 11.5 - 15.5 % Final  . Platelets 10/26/2018 126* 150 - 400 K/uL Final  . nRBC 10/26/2018 0.0  0.0 - 0.2 % Final  . Neutrophils Relative % 10/26/2018 57  % Final  . Neutro Abs 10/26/2018 1.9  1.7 - 7.7 K/uL Final  . Lymphocytes Relative 10/26/2018 21  % Final  . Lymphs Abs 10/26/2018 0.7  0.7 - 4.0 K/uL Final  . Monocytes Relative 10/26/2018 15  % Final  . Monocytes Absolute 10/26/2018 0.5  0.1 - 1.0 K/uL Final  . Eosinophils Relative 10/26/2018 5  % Final  . Eosinophils Absolute 10/26/2018 0.2  0.0 - 0.5 K/uL Final  . Basophils Relative 10/26/2018 1  % Final  . Basophils Absolute 10/26/2018 0.0  0.0 - 0.1 K/uL Final  . Immature Granulocytes 10/26/2018 1  % Final  . Abs Immature Granulocytes 10/26/2018 0.02  0.00 - 0.07 K/uL Final   Performed at [facility], [address] [place], [place] [address]    Assessment:  DESIRAI TRAXLER is a 73 y.o. female with multi-focal triple negative clinical stage T1cN1Mx right breast cancers/p biopsy on 05/28/2017. Pathologyrevealed two grade II invasive mammary carcinomas of no special type (4 cm from the nipple and 5 cm from the nipple). Tumors were in close proximity. Lymph node biopsy confirmed metastatic carcinoma of breast origin. Tumor is ER negative (<1%), PR negative and Her 2/neu 2+ by IHC. [facility] genetic testingon 07/21/2017 revealed a variant of uncertain  significance in RECQL4.  Right sided mammogram and ultrasound on 05/21/2017 revealed 2 highly suspicious masses in the right breast at the 10:30 position 4 cm from nipple (1.8 x 1.2 x 1.1 cm) and at the 10:30 position 5 cm from nipple (1.1 x 0.9 x 0.9 cm). There was a suspicious 2.7 x 1 x 2.2 cm lymph node in the right axilla with asymmetric nodular cortical thickening.  Ultrasound guided biopsyof the 2 masses and lymph node on 05/28/2017 revealed grade II invasive mammary carcinoma of no special type (4 cm from the nipple and 5 cm from the nipple). Lymph node biopsy revealed metastatic carcinoma of breast origin. Tumor is ER negative (<1%), PR negative and Her 2/neu 2+ by IHC. FISH was negative.   CA 27.29has been followed: 16.5 on 06/04/2017 and 14.3 on 05/21/2018. CA15-3was 17.6 on 06/04/2017.  [place]oon 06/09/2017 revealed an EF of 60-65%. She has enrolled on the UPBEAT clinical trial.  She received 4 cycles of AC(06/23/2017 - 09/08/2017) with Margarette Canada support. Cycle #1 was complicated by influenza A. Nadir counts included an ANC of 0 and platelet count of 71,000. Cycle #2 postponed due to slow recovery from influenza and excisional cyst removal with surrounding cellulitis (treated with 10 days of doxycycline).   She received 12 weeks of Taxol (09/29/2017 - 01/05/2018). She received GCSFafter week #3 and 5 Taxol secondary to borderline counts. Cycle #5 was postponed on 10/29/2017 due to patient's wishes to be treated on Tuesdays only. Week #7 Taxol was held secondary to neutropenia (ANC 200).  Right mammogram and ultrasoundon 09/28/2017 revealed the 2 adjacent masses in the right breast at the 10:30 o'clock position had  both decreased in size (1.8 x 1.1 x 1.2 cm to 1.2 x 0.7 x 1.1 cm; 11 x 9 x 9 mm to 7 x 6 x 6 mm) as well as the metastatic right axillary lymph node (2.7 cm to 1.6 x 0.9 x 1.7 cm).   Right partial mastectomyand sentinel lymph node biopsy on 02/01/2018  Pathology revealed 10 mm grade II invasive carcinoma of no special type. The tumor bed encompassed the biopsy sites, with foci of residual carcinoma at both biopsy sites and also involving the tissue between the biopsy sites. Foci of residual carcinoma range from 1 to 10 mm.There was no DCIS. Distance from closest margin was 1 mm. Four of 4 lymph nodes were negative. Pathologic stage was ypT1b (m) ypN0 (sn).  She received breast radiationfrom 03/10/2018 - 05/07/2018.  She iss/pcycle #3Xeloda(07/06/2018 - 09/15/2018).  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 Normal labs on 11/24/2017 included: B12, folate, TSH, and copper.  Bone densityon 07/01/2018 was normal witha T score of -0.8 and the RIGHTfemoral neck.  [facility] with with fever, cough and generalized weakness. Nasal swab was + forinfluenza A. She completed a course of Tamiflu.  Symptomatically, she is doing well.  She has on and off symptoms related to Xeloda (manageable).  She has a tiny eschar overlying her seroma.  Plan: 1.   Labs today:CBC with diff, CMP, Mg 2.   Multifocal triple negative RIGHT breast cancer Clinically, she is doing well. She has tolerated 4 cycles of Xeloda with manageable side effects. She is tolerating reduced dose Xeloda (1500 mg twice daily 2 weeks on/1 week off). Continue current schedule for a total of 8 cycles of adjuvant Xeloda. Begin cycle #5 Xeloda tomorrow. Discuss symptom management.  She has antiemetics and pain medications at home to use on a prn bases.  Interventions are adequate.   3.   Diarrhea  Patient has minimal well-controlled diarrhea on current dose of Xeloda  Diarrhea is every 2-3 days then none.  She is not taking Imodium.   Continue to monitor. 4.Right breast seroma Patient has a slowly healing breast seroma. Continue to monitor. 5.   Elevated bilirubin  Bilirubin is  1.9.  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  Check direct bilirubin. 6.   RTC in 3 weeks for MD assessment and labs (CBC with diff, CMP, direct bilirubin).  I discussed the assessment and treatment plan with the patient.  The patient was provided an opportunity to ask questions and all were answered.  The patient agreed with the plan and demonstrated an understanding of the instructions.  The patient was advised to call back if the symptoms worsen or if the condition fails to improve as anticipated.   Lequita Asal, MD, PhD    10/26/2018, 11:41 AM  I, Selena Batten, am acting as scribe for [employer]. Mike Gip, MD, PhD.  I, Melissa C. Mike Gip, MD, have reviewed the above documentation for accuracy and completeness, and I agree with the above.

## 2018-10-26 ENCOUNTER — Inpatient Hospital Stay: Payer: Medicare Other

## 2018-10-26 ENCOUNTER — Other Ambulatory Visit: Payer: Self-pay

## 2018-10-26 ENCOUNTER — Inpatient Hospital Stay: Payer: Medicare Other | Attending: Hematology and Oncology | Admitting: Hematology and Oncology

## 2018-10-26 ENCOUNTER — Encounter: Payer: Self-pay | Admitting: Hematology and Oncology

## 2018-10-26 VITALS — BP 126/68 | HR 70 | Temp 97.4°F | Resp 16 | Wt 145.1 lb

## 2018-10-26 DIAGNOSIS — C50911 Malignant neoplasm of unspecified site of right female breast: Secondary | ICD-10-CM

## 2018-10-26 DIAGNOSIS — N6489 Other specified disorders of breast: Secondary | ICD-10-CM

## 2018-10-26 DIAGNOSIS — R17 Unspecified jaundice: Secondary | ICD-10-CM | POA: Diagnosis not present

## 2018-10-26 DIAGNOSIS — Z79899 Other long term (current) drug therapy: Secondary | ICD-10-CM | POA: Diagnosis not present

## 2018-10-26 DIAGNOSIS — Z9011 Acquired absence of right breast and nipple: Secondary | ICD-10-CM

## 2018-10-26 DIAGNOSIS — R197 Diarrhea, unspecified: Secondary | ICD-10-CM | POA: Insufficient documentation

## 2018-10-26 DIAGNOSIS — Z809 Family history of malignant neoplasm, unspecified: Secondary | ICD-10-CM | POA: Diagnosis not present

## 2018-10-26 DIAGNOSIS — Z923 Personal history of irradiation: Secondary | ICD-10-CM | POA: Insufficient documentation

## 2018-10-26 DIAGNOSIS — Z87891 Personal history of nicotine dependence: Secondary | ICD-10-CM

## 2018-10-26 DIAGNOSIS — Z801 Family history of malignant neoplasm of trachea, bronchus and lung: Secondary | ICD-10-CM | POA: Diagnosis not present

## 2018-10-26 DIAGNOSIS — Z171 Estrogen receptor negative status [ER-]: Secondary | ICD-10-CM

## 2018-10-26 LAB — CBC WITH DIFFERENTIAL/PLATELET
Abs Immature Granulocytes: 0.02 10*3/uL (ref 0.00–0.07)
Basophils Absolute: 0 10*3/uL (ref 0.0–0.1)
Basophils Relative: 1 %
Eosinophils Absolute: 0.2 10*3/uL (ref 0.0–0.5)
Eosinophils Relative: 5 %
HCT: 34.8 % — ABNORMAL LOW (ref 36.0–46.0)
Hemoglobin: 12.3 g/dL (ref 12.0–15.0)
Immature Granulocytes: 1 %
Lymphocytes Relative: 21 %
Lymphs Abs: 0.7 10*3/uL (ref 0.7–4.0)
MCH: 36.2 pg — ABNORMAL HIGH (ref 26.0–34.0)
MCHC: 35.3 g/dL (ref 30.0–36.0)
MCV: 102.4 fL — ABNORMAL HIGH (ref 80.0–100.0)
Monocytes Absolute: 0.5 10*3/uL (ref 0.1–1.0)
Monocytes Relative: 15 %
Neutro Abs: 1.9 10*3/uL (ref 1.7–7.7)
Neutrophils Relative %: 57 %
Platelets: 126 10*3/uL — ABNORMAL LOW (ref 150–400)
RBC: 3.4 MIL/uL — ABNORMAL LOW (ref 3.87–5.11)
RDW: 17.8 % — ABNORMAL HIGH (ref 11.5–15.5)
WBC: 3.2 10*3/uL — ABNORMAL LOW (ref 4.0–10.5)
nRBC: 0 % (ref 0.0–0.2)

## 2018-10-26 LAB — COMPREHENSIVE METABOLIC PANEL
ALT: 23 U/L (ref 0–44)
AST: 27 U/L (ref 15–41)
Albumin: 4.5 g/dL (ref 3.5–5.0)
Alkaline Phosphatase: 52 U/L (ref 38–126)
Anion gap: 10 (ref 5–15)
BUN: 15 mg/dL (ref 8–23)
CO2: 25 mmol/L (ref 22–32)
Calcium: 9.4 mg/dL (ref 8.9–10.3)
Chloride: 99 mmol/L (ref 98–111)
Creatinine, Ser: 0.82 mg/dL (ref 0.44–1.00)
GFR calc Af Amer: >60 mL/min (ref >60)
GFR calc non Af Amer: >60 mL/min (ref >60)
Glucose, Bld: 120 mg/dL — ABNORMAL HIGH (ref 70–99)
Potassium: 3.7 mmol/L (ref 3.5–5.1)
Sodium: 134 mmol/L — ABNORMAL LOW (ref 135–145)
Total Bilirubin: 1.9 mg/dL — ABNORMAL HIGH (ref 0.3–1.2)
Total Protein: 6.9 g/dL (ref 6.5–8.1)

## 2018-10-26 LAB — MAGNESIUM: Magnesium: 2.1 mg/dL (ref 1.7–2.4)

## 2018-10-26 LAB — BILIRUBIN, DIRECT: Bilirubin, Direct: 0.3 mg/dL — ABNORMAL HIGH (ref 0.0–0.2)

## 2018-10-26 MED ORDER — HEPARIN SOD (PORK) LOCK FLUSH 100 UNIT/ML IV SOLN
500.0000 [IU] | Freq: Once | INTRAVENOUS | Status: AC
  Administered 2018-10-26: 500 [IU] via INTRAVENOUS

## 2018-10-26 MED ORDER — SODIUM CHLORIDE 0.9% FLUSH
10.0000 mL | INTRAVENOUS | Status: DC | PRN
  Administered 2018-10-26: 10 mL via INTRAVENOUS
  Filled 2018-10-26: qty 10

## 2018-10-26 NOTE — Progress Notes (Signed)
Pt here for follow up. Denies any concerns.  

## 2018-11-11 NOTE — Progress Notes (Signed)
[facility]  [address], [address] [place], [place] [address] Phone: [phone]  Fax: [phone]   Clinic Day:  11/16/2018  Referring physician: Baxter Hire, MD  Chief Complaint: Stephanie Sanders is a 73 y.o. 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   HPI: The patient was last seen in the medical oncology clinic on 10/26/2018. At that time, she was doing well. She had on and off symptoms related to Xeloda (manageable). She had a tiny eschar overlying her seroma.  Hematocrit 34.8, hemoglobin 12.3, platelets 126,000,WBC 3,200. Bilirubin 1.9.   During the interim, the patient states "I'm good". Her diarrhea is unchanged, and she "never knows when its going to hit". She likes to stay close to home so she does not have any accidents with her bowels. She denies any mouth sores. Her seroma has some fluid still and is improving.   She notes her feet are tender on the bottom. She notes x 3 days ago it felt like her right big toe had a sharp ongoing needle pain, which has now resolved. She puts socks on every morning.   The patient had some concerns about the way her left breast was healing. She reports her left breast itches sometimes and it "feels like it is itching under her skin".   Past Medical History:  Diagnosis Date  . Cancer ([place]) 04/2017   right breast  . Diabetes mellitus without complication ([place])   . Diabetic nephropathy ([place])   . Headache    migraines  . Heart murmur    ASYMPTOMATIC  . Hypertension   . Hypothyroidism   . Personal history of chemotherapy 2019   right breast cancer  . Pneumonia   . Seborrheic keratosis   . Vitamin D deficiency     Past Surgical History:  Procedure Laterality Date  . APPENDECTOMY    . AXILLARY LYMPH NODE DISSECTION Right 02/01/2018   Procedure: AXILLARY LYMPH NODE DISSECTION;  Surgeon: Herbert Pun, MD;   Location: [facility] ORS;  Service: General;  Laterality: Right;  . BREAST BIOPSY Right 05/28/2017   right breast bx 3 areas invasive mamm ca lymph node mets  . BREAST CYST ASPIRATION Bilateral    neg  . BREAST LUMPECTOMY Right 02/01/2018  . CHOLECYSTECTOMY    . COLONOSCOPY WITH PROPOFOL N/A 01/01/2015   Procedure: COLONOSCOPY WITH PROPOFOL;  Surgeon: Josefine Class, MD;  Location: [facility] ENDOSCOPY;  Service: Endoscopy;  Laterality: N/A;  . EYE SURGERY Left    eyelid  . PARTIAL MASTECTOMY WITH NEEDLE LOCALIZATION Right 02/01/2018   Procedure: PARTIAL MASTECTOMY WITH NEEDLE LOCALIZATION;  Surgeon: Herbert Pun, MD;  Location: [facility] ORS;  Service: General;  Laterality: Right;  . PORTACATH PLACEMENT Right 06/10/2017   Procedure: INSERTION PORT-A-CATH;  Surgeon: Herbert Pun, MD;  Location: [facility] ORS;  Service: General;  Laterality: Right;  . SENTINEL NODE BIOPSY Right 02/01/2018   Procedure: SENTINEL NODE BIOPSY;  Surgeon: Herbert Pun, MD;  Location: [facility] ORS;  Service: General;  Laterality: Right;  . STENT PLACEMENT ILIAC ([place] [facility])     temporary placement s/p cholecystectomy  . TUBAL LIGATION      Family History  Problem Relation Age of Onset  . Parkinson's disease Mother   . Cancer Maternal Aunt   . Cancer Father   . Lung cancer Father   . Breast cancer Neg Hx     Social History:  reports that  she quit smoking about 46 years ago. Her smoking use included cigarettes. She has a 20.00 pack-year smoking history. She has never used smokeless tobacco. She reports that she does not drink alcohol or use drugs. She has 3 children (daughters: age 90 and 26; son Jaquelyn Bitter): age 56). She is retired. She worked for the [employer]. Her husband has dementia. She lives in [place]. The patient is alone today.  Allergies:  Allergies  Allergen Reactions  . Fiorinal [Butalbital-Aspirin-Caffeine] Nausea And Vomiting and Other (See Comments)    SEVERE HEADACHE  . Sulfa Antibiotics  Other (See Comments)    Burning esophagus and stomach  . Tramadol Nausea And Vomiting  . Other Rash    STERI-STRIPS-RASH    Current Medications: Current Outpatient Medications  Medication Sig Dispense Refill  . acetaminophen (TYLENOL) 500 MG tablet Take 1,000 mg by mouth every 6 (six) hours as needed for moderate pain or headache.     Marland Kitchen atorvastatin (LIPITOR) 10 MG tablet Take 10 mg by mouth every evening.     . capecitabine (XELODA) 500 MG tablet Take 3 tablets (1,500 mg total) by mouth 2 (two) times daily after a meal. Take with two 150mg  tablets. Take for 14days, then hold for 7days 84 tablet 1  . Cholecalciferol 2000 UNITS CAPS Take 4,000 Units by mouth daily.     . Lactobacillus (PROBIOTIC ACIDOPHILUS PO) Take 1 capsule by mouth daily.     Marland Kitchen levothyroxine (SYNTHROID, LEVOTHROID) 50 MCG tablet Take 50 mcg by mouth daily before breakfast.    . MAGNESIUM GLYCINATE PLUS PO Take 1 tablet by mouth at bedtime.     . metFORMIN (GLUCOPHAGE) 1000 MG tablet Take 1,000 mg by mouth 2 (two) times daily with a meal.    . quinapril (ACCUPRIL) 5 MG tablet Take 5 mg by mouth every morning.     . sodium bicarbonate 650 MG tablet Take 1,300 mg by mouth at bedtime.    . lidocaine-prilocaine (EMLA) cream Apply 1 application topically as needed. (Patient not taking: Reported on 11/16/2018) 30 g 6   No current facility-administered medications for this visit.    Facility-Administered Medications Ordered in Other Visits  Medication Dose Route Frequency Provider Last Rate Last Dose  . sodium chloride flush (NS) 0.9 % injection 10 mL  10 mL Intravenous PRN Lequita Asal, MD   10 mL at 10/21/17 0841    Review of Systems  Constitutional: Positive for malaise/fatigue. Negative for chills, fever and weight loss (stable).       "I'm good".  HENT: Negative for congestion, hearing loss and sore throat.   Eyes: Negative for blurred vision.  Respiratory: Negative for cough, hemoptysis and shortness of  breath.   Cardiovascular: Negative for chest pain and palpitations.  Gastrointestinal: Positive for diarrhea (2-3 episodes, resolved with Imodium). Negative for abdominal pain, blood in stool, constipation, melena, nausea and vomiting.  Genitourinary: Negative for dysuria, frequency, hematuria and urgency.  Musculoskeletal: Negative for back pain, joint pain and myalgias.  Skin: Positive for itching (left breast). Negative for rash.       No palmar or plantar desquamation. History of chronic drainage from seroma, now scabbed over (improving).  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 Negative for dizziness, tingling, weakness and headaches.       Numbness in finger tips.  Endo/Heme/Allergies: Does not bruise/bleed easily.       Diabetes.  Psychiatric/Behavioral: Negative for depression and memory loss. The patient is  not nervous/anxious and does not have insomnia.   All other systems reviewed and are negative.  Performance status (ECOG): 1  Vitals Blood pressure (!) 120/56, pulse 83, temperature 98.5 F (36.9 C), resp. rate 18, height 5\' 8"  (1.727 m), weight 146 lb 6.2 oz (66.4 kg), SpO2 100 %.   Physical Exam  Constitutional: She is oriented to person, place, and time. She appears well-developed and well-nourished. No distress.  HENT:  Head: Normocephalic and atraumatic.  Mouth/Throat: Oropharynx is clear and moist. No oropharyngeal exudate.  Short gray/silver hair. Mask.  Eyes: Pupils are equal, round, and reactive to light. Conjunctivae and EOM are normal. No scleral icterus.  Blue eyes.  Neck: Normal range of motion. Neck supple. No JVD present.  Cardiovascular: Normal rate, regular rhythm and normal heart sounds.  No murmur heard. Pulmonary/Chest: Effort normal and breath sounds normal. No respiratory distress. She has no wheezes. She has no rales. She exhibits edema (right breast, from radiation). She exhibits no  tenderness. Right breast exhibits skin change (2-3 mm eschar overlying seroma) and tenderness.  Right breast has moderate scar tissue.  Abdominal: Soft. Bowel sounds are normal. She exhibits no distension and no mass. There is no abdominal tenderness. There is no rebound and no guarding.  Musculoskeletal: Normal range of motion.        General: No tenderness or edema.  Lymphadenopathy:    She has no cervical adenopathy.    She has no axillary adenopathy.       Right: No supraclavicular adenopathy present.       Left: No supraclavicular adenopathy present.  Neurological: She is alert and oriented to person, place, and time.  Skin: Skin is warm and dry. She is not diaphoretic. There is erythema (right breast, from scratching).  No palmar or plantar erythema or desquamation.   Psychiatric: She has a normal mood and affect. Her behavior is normal. Judgment and thought content normal.  Nursing note and vitals reviewed.    Right breast   Appointment on 11/16/2018  Component Date Value Ref Range Status  . Bilirubin, Direct 11/16/2018 0.3* 0.0 - 0.2 mg/dL Final   Performed at [facility], [address], [place], [place] [address]  . Sodium 11/16/2018 134* 135 - 145 mmol/L Final  . Potassium 11/16/2018 4.0  3.5 - 5.1 mmol/L Final  . Chloride 11/16/2018 101  98 - 111 mmol/L Final  . CO2 11/16/2018 24  22 - 32 mmol/L Final  . Glucose, Bld 11/16/2018 168* 70 - 99 mg/dL Final  . BUN 11/16/2018 13  8 - 23 mg/dL Final  . Creatinine, Ser 11/16/2018 0.87  0.44 - 1.00 mg/dL Final  . Calcium 11/16/2018 9.5  8.9 - 10.3 mg/dL Final  . Total Protein 11/16/2018 6.8  6.5 - 8.1 g/dL Final  . Albumin 11/16/2018 4.5  3.5 - 5.0 g/dL Final  . AST 11/16/2018 26  15 - 41 U/L Final  . ALT 11/16/2018 30  0 - 44 U/L Final  . Alkaline Phosphatase 11/16/2018 54  38 - 126 U/L Final  . Total Bilirubin 11/16/2018 1.7* 0.3 - 1.2 mg/dL Final  . GFR calc non Af Amer 11/16/2018 >60  >60 mL/min Final  . GFR  calc Af Amer 11/16/2018 >60  >60 mL/min Final  . Anion gap 11/16/2018 9  5 - 15 Final   Performed at [facility], [address], [place], [place] [address]  . WBC 11/16/2018 3.3* 4.0 - 10.5 K/uL Final  .  RBC 11/16/2018 3.39* 3.87 - 5.11 MIL/uL Final  . Hemoglobin 11/16/2018 12.6  12.0 - 15.0 g/dL Final  . HCT 11/16/2018 35.3* 36.0 - 46.0 % Final  . MCV 11/16/2018 104.1* 80.0 - 100.0 fL Final  . MCH 11/16/2018 37.2* 26.0 - 34.0 pg Final  . MCHC 11/16/2018 35.7  30.0 - 36.0 g/dL Final  . RDW 11/16/2018 18.5* 11.5 - 15.5 % Final  . Platelets 11/16/2018 128* 150 - 400 K/uL Final  . nRBC 11/16/2018 0.0  0.0 - 0.2 % Final  . Neutrophils Relative % 11/16/2018 63  % Final  . Neutro Abs 11/16/2018 2.1  1.7 - 7.7 K/uL Final  . Lymphocytes Relative 11/16/2018 21  % Final  . Lymphs Abs 11/16/2018 0.7  0.7 - 4.0 K/uL Final  . Monocytes Relative 11/16/2018 12  % Final  . Monocytes Absolute 11/16/2018 0.4  0.1 - 1.0 K/uL Final  . Eosinophils Relative 11/16/2018 3  % Final  . Eosinophils Absolute 11/16/2018 0.1  0.0 - 0.5 K/uL Final  . Basophils Relative 11/16/2018 1  % Final  . Basophils Absolute 11/16/2018 0.0  0.0 - 0.1 K/uL Final  . Immature Granulocytes 11/16/2018 0  % Final  . Abs Immature Granulocytes 11/16/2018 0.01  0.00 - 0.07 K/uL Final   Performed at [facility], [address] [place], [place] [address]    Assessment:  DABRIA WADAS is a 73 y.o. female with multi-focal triple negative clinical stage T1cN1Mx right breast cancers/p biopsy on 05/28/2017. Pathologyrevealed two grade II invasive mammary carcinomas of no special type (4 cm from the nipple and 5 cm from the nipple). Tumors were in close proximity. Lymph node biopsy confirmed metastatic carcinoma of breast origin. Tumor is ER negative (<1%), PR negative and Her 2/neu 2+ by IHC. [facility] genetic testingon 07/21/2017 revealed a variant of uncertain significance in RECQL4.  Right sided  mammogram and ultrasound on 05/21/2017 revealed 2 highly suspicious masses in the right breast at the 10:30 position 4 cm from nipple (1.8 x 1.2 x 1.1 cm) and at the 10:30 position 5 cm from nipple (1.1 x 0.9 x 0.9 cm). There was a suspicious 2.7 x 1 x 2.2 cm lymph node in the right axilla with asymmetric nodular cortical thickening.  Ultrasound guided biopsyof the 2 masses and lymph node on 05/28/2017 revealed grade II invasive mammary carcinoma of no special type (4 cm from the nipple and 5 cm from the nipple). Lymph node biopsy revealed metastatic carcinoma of breast origin. Tumor is ER negative (<1%), PR negative and Her 2/neu 2+ by IHC. FISH was negative.   CA 27.29has been followed: 16.5 on 06/04/2017, 14.3 on 05/21/2018, 20.3 on 06/18/2018, and 27.5 on 11/16/2018. CA15-3was 17.6 on 06/04/2017.  Echoon 06/09/2017 revealed an EF of 60-65%. She has enrolled on the UPBEAT clinical trial.  She received 4 cycles of AC(06/23/2017 - 09/08/2017) with Margarette Canada support. Cycle #1 was complicated by influenza A. Nadir counts included an ANC of 0 and platelet count of 71,000. Cycle #2 postponed due to slow recovery from influenza and excisional cyst removal with surrounding cellulitis (treated with 10 days of doxycycline).   She received 12 weeks of Taxol (09/29/2017 - 01/05/2018). She received GCSFafter week #3 and 5 Taxol secondary to borderline counts. Cycle #5 was postponed on 10/29/2017 due to patient's wishes to be treated on Tuesdays only. Week #7 Taxol was held secondary to neutropenia (ANC 200).  Right mammogram and ultrasoundon 09/28/2017 revealed the 2 adjacent masses in  the right breast at the 10:30 o'clock position had both decreased in size (1.8 x 1.1 x 1.2 cm to 1.2 x 0.7 x 1.1 cm; 11 x 9 x 9 mm to 7 x 6 x 6 mm) as well as the metastatic right axillary lymph node (2.7 cm to 1.6 x 0.9 x 1.7 cm).   Right partial mastectomyand sentinel lymph node biopsy on 02/01/2018  Pathology revealed 10 mm grade II invasive carcinoma of no special type. The tumor bed encompassed the biopsy sites, with foci of residual carcinoma at both biopsy sites and also involving the tissue between the biopsy sites. Foci of residual carcinoma range from 1 to 10 mm.There was no DCIS. Distance from closest margin was 1 mm. Four of 4 lymph nodes were negative. Pathologic stage was ypT1b (m) ypN0 (sn).  She received breast radiationfrom 03/10/2018 - 05/07/2018.  She iss/pcycle #5Xeloda(07/06/2018 - 10/27/2018).  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 Normal labs on 11/24/2017 included: B12, folate, TSH, and copper.  Bone densityon 07/01/2018 was normal witha T score of -0.8 and the RIGHTfemoral neck.  [facility] with with fever, cough and generalized weakness. Nasal swab was + forinfluenza A. She completed a course of Tamiflu.  Symptomatically, she is doing well.  She has intermittent diarrhea and tender feet.  Exam reveals significant postoperative changes in the right breast.  Plan: 1.   Labs today: CBC with diff, CMP, direct bilirubin. 2.Multifocal triple negative RIGHT breast cancer Clinically, she is doing well.  Clinically, she is doing well. She is s/p 5 cycles of Xeloda with manageable side effects.  She continues on low-dose Xeloda (1500 mg twice daily 2 weeks on/1 week off). Plan is for a total of 8 cycles of adjuvant Xeloda. Begin cycle # 6 Xeloda tomorrow. Discuss symptom management.  She has antiemetics and pain medications at home to use on a prn bases.  Interventions are adequate.     3. Diarrhea  Diarrhea is well controlled. No intervention is necessary.  She is not taking Imodium. Continue to monitor. 4.Right breast seroma She continues to have an intermittent leaking seroma. Send note to Dr Windell Moment, Psychologist, sport and exercise. 5.Elevated bilirubin              Bilirubin is 1.7 (direct 0.3).             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   RTC in 3 weeks for MD assessment and labs (CBC with diff, CMP, direct bilirubin).  I discussed the assessment and treatment plan with the patient.  The patient was provided an opportunity to ask questions and all were answered.  The patient agreed with the plan and demonstrated an understanding of the instructions.  The patient was advised to call back if the symptoms worsen or if the condition fails to improve as anticipated.  I provided 15 minutes of face-to-face time during this this encounter and > 50% was spent counseling as documented under my assessment and plan.    Lequita Asal, MD, PhD    11/16/2018, 10:42 AM  I, Selena Batten, am acting as scribe for [employer]. Mike Gip, MD, PhD.  I, Melissa C. Mike Gip, MD, have reviewed the above documentation for accuracy and completeness, and I agree with the above.

## 2018-11-16 ENCOUNTER — Inpatient Hospital Stay: Payer: Medicare Other

## 2018-11-16 ENCOUNTER — Other Ambulatory Visit: Payer: Self-pay

## 2018-11-16 ENCOUNTER — Encounter: Payer: Self-pay | Admitting: Hematology and Oncology

## 2018-11-16 ENCOUNTER — Inpatient Hospital Stay (HOSPITAL_BASED_OUTPATIENT_CLINIC_OR_DEPARTMENT_OTHER): Payer: Medicare Other | Admitting: Hematology and Oncology

## 2018-11-16 VITALS — BP 120/56 | HR 83 | Temp 98.5°F | Resp 18 | Ht 68.0 in | Wt 146.4 lb

## 2018-11-16 DIAGNOSIS — R197 Diarrhea, unspecified: Secondary | ICD-10-CM

## 2018-11-16 DIAGNOSIS — C50911 Malignant neoplasm of unspecified site of right female breast: Secondary | ICD-10-CM

## 2018-11-16 DIAGNOSIS — R110 Nausea: Secondary | ICD-10-CM

## 2018-11-16 DIAGNOSIS — Z171 Estrogen receptor negative status [ER-]: Secondary | ICD-10-CM

## 2018-11-16 DIAGNOSIS — R17 Unspecified jaundice: Secondary | ICD-10-CM

## 2018-11-16 DIAGNOSIS — Z923 Personal history of irradiation: Secondary | ICD-10-CM

## 2018-11-16 DIAGNOSIS — Z79899 Other long term (current) drug therapy: Secondary | ICD-10-CM

## 2018-11-16 DIAGNOSIS — Z9011 Acquired absence of right breast and nipple: Secondary | ICD-10-CM

## 2018-11-16 DIAGNOSIS — N6489 Other specified disorders of breast: Secondary | ICD-10-CM

## 2018-11-16 DIAGNOSIS — Z809 Family history of malignant neoplasm, unspecified: Secondary | ICD-10-CM

## 2018-11-16 DIAGNOSIS — Z801 Family history of malignant neoplasm of trachea, bronchus and lung: Secondary | ICD-10-CM

## 2018-11-16 LAB — COMPREHENSIVE METABOLIC PANEL
ALT: 30 U/L (ref 0–44)
AST: 26 U/L (ref 15–41)
Albumin: 4.5 g/dL (ref 3.5–5.0)
Alkaline Phosphatase: 54 U/L (ref 38–126)
Anion gap: 9 (ref 5–15)
BUN: 13 mg/dL (ref 8–23)
CO2: 24 mmol/L (ref 22–32)
Calcium: 9.5 mg/dL (ref 8.9–10.3)
Chloride: 101 mmol/L (ref 98–111)
Creatinine, Ser: 0.87 mg/dL (ref 0.44–1.00)
GFR calc Af Amer: >60 mL/min (ref >60)
GFR calc non Af Amer: >60 mL/min (ref >60)
Glucose, Bld: 168 mg/dL — ABNORMAL HIGH (ref 70–99)
Potassium: 4 mmol/L (ref 3.5–5.1)
Sodium: 134 mmol/L — ABNORMAL LOW (ref 135–145)
Total Bilirubin: 1.7 mg/dL — ABNORMAL HIGH (ref 0.3–1.2)
Total Protein: 6.8 g/dL (ref 6.5–8.1)

## 2018-11-16 LAB — CBC WITH DIFFERENTIAL/PLATELET
Abs Immature Granulocytes: 0.01 10*3/uL (ref 0.00–0.07)
Basophils Absolute: 0 10*3/uL (ref 0.0–0.1)
Basophils Relative: 1 %
Eosinophils Absolute: 0.1 10*3/uL (ref 0.0–0.5)
Eosinophils Relative: 3 %
HCT: 35.3 % — ABNORMAL LOW (ref 36.0–46.0)
Hemoglobin: 12.6 g/dL (ref 12.0–15.0)
Immature Granulocytes: 0 %
Lymphocytes Relative: 21 %
Lymphs Abs: 0.7 10*3/uL (ref 0.7–4.0)
MCH: 37.2 pg — ABNORMAL HIGH (ref 26.0–34.0)
MCHC: 35.7 g/dL (ref 30.0–36.0)
MCV: 104.1 fL — ABNORMAL HIGH (ref 80.0–100.0)
Monocytes Absolute: 0.4 10*3/uL (ref 0.1–1.0)
Monocytes Relative: 12 %
Neutro Abs: 2.1 10*3/uL (ref 1.7–7.7)
Neutrophils Relative %: 63 %
Platelets: 128 10*3/uL — ABNORMAL LOW (ref 150–400)
RBC: 3.39 MIL/uL — ABNORMAL LOW (ref 3.87–5.11)
RDW: 18.5 % — ABNORMAL HIGH (ref 11.5–15.5)
WBC: 3.3 10*3/uL — ABNORMAL LOW (ref 4.0–10.5)
nRBC: 0 % (ref 0.0–0.2)

## 2018-11-16 LAB — BILIRUBIN, DIRECT: Bilirubin, Direct: 0.3 mg/dL — ABNORMAL HIGH (ref 0.0–0.2)

## 2018-11-16 NOTE — Progress Notes (Signed)
No new changes noted today 

## 2018-11-17 ENCOUNTER — Other Ambulatory Visit: Payer: Self-pay

## 2018-11-17 ENCOUNTER — Ambulatory Visit
Admission: RE | Admit: 2018-11-17 | Discharge: 2018-11-17 | Disposition: A | Discharge status: Home / Self Care | Payer: Medicare Other | Source: Ambulatory Visit | Attending: Radiation Oncology | Admitting: Radiation Oncology

## 2018-11-17 ENCOUNTER — Encounter: Payer: Self-pay | Admitting: Radiation Oncology

## 2018-11-17 VITALS — BP 127/56 | HR 80 | Temp 98.3°F | Resp 18 | Wt 147.4 lb

## 2018-11-17 DIAGNOSIS — Z923 Personal history of irradiation: Secondary | ICD-10-CM | POA: Insufficient documentation

## 2018-11-17 DIAGNOSIS — C50411 Malignant neoplasm of upper-outer quadrant of right female breast: Secondary | ICD-10-CM | POA: Diagnosis present

## 2018-11-17 DIAGNOSIS — Z171 Estrogen receptor negative status [ER-]: Secondary | ICD-10-CM | POA: Diagnosis not present

## 2018-11-17 DIAGNOSIS — C50911 Malignant neoplasm of unspecified site of right female breast: Secondary | ICD-10-CM

## 2018-11-17 LAB — CANCER ANTIGEN 27.29: CA 27.29: 27.5 U/mL (ref 0.0–38.6)

## 2018-11-17 MED ORDER — CAPECITABINE 500 MG PO TABS: 1500.0000 mg | ORAL_TABLET | Freq: Two times a day (BID) | ORAL | 1 refills | Status: DC

## 2018-11-17 NOTE — Progress Notes (Signed)
Radiation Oncology Follow up Note  Name: Stephanie Sanders   Date:   11/17/2018 MRN:  [id number] DOB: 1946-03-31    This 73 y.o. female presents to the clinic today for 61-month follow-up status post whole breast radiation to her right breast for stage IIb triple negative invasive mammary carcinoma.  REFERRING PROVIDER: Baxter Hire, MD  HPI: Patient is a 73 year old female now out 6 months having completed whole breast radiation to her right breast for stage IIb triple negative invasive mammary carcinoma seen today in routine follow-up she is doing fairly well she is had some complications from her surgery with tethering of the scar since this was not a curvilinear scar initially..  She is currently on oral Xeloda under medical oncology's direction.  COMPLICATIONS OF TREATMENT: none  FOLLOW UP COMPLIANCE: keeps appointments   PHYSICAL EXAM:  BP (!) 127/56 (BP Location: Left Arm, Patient Position: Sitting)   Pulse 80   Temp 98.3 F (36.8 C) (Tympanic)   Resp 18   Wt 147 lb 6.4 oz (66.9 kg)   BMI 22.41 kg/m  Right breast is hard and retracted towards the right with a fair cosmetic result no dominant mass or nodularity is noted in either breast in 2 positions examined no axillary or supraclavicular adenopathy is noted.  Well-developed well-nourished patient in NAD. HEENT reveals PERLA, EOMI, discs not visualized.  Oral cavity is clear. No oral mucosal lesions are identified. Neck is clear without evidence of cervical or supraclavicular adenopathy. Lungs are clear to A&P. Cardiac examination is essentially unremarkable with regular rate and rhythm without murmur rub or thrill. Abdomen is benign with no organomegaly or masses noted. Motor sensory and DTR levels are equal and symmetric in the upper and lower extremities. Cranial nerves II through XII are grossly intact. Proprioception is intact. No peripheral adenopathy or edema is identified. No motor or sensory levels are noted. Crude visual  fields are within normal range.  RADIOLOGY RESULTS: No current films for review  PLAN: Present time patient is doing well I have emphasized exercising her right breast is much as possible.  Fairly slightly poor cosmetic result secondary to surgical incision.  I have asked to see her back in 6 months for follow-up and then will start once a year follow-up visits.  Patient knows to call with any concerns.  I would like to take this opportunity to thank you for allowing me to participate in the care of your patient.Noreene Filbert, MD

## 2018-12-07 ENCOUNTER — Other Ambulatory Visit: Payer: Self-pay

## 2018-12-07 ENCOUNTER — Inpatient Hospital Stay: Payer: Medicare Other | Attending: Hematology and Oncology

## 2018-12-07 ENCOUNTER — Inpatient Hospital Stay (HOSPITAL_BASED_OUTPATIENT_CLINIC_OR_DEPARTMENT_OTHER): Payer: Medicare Other | Admitting: Oncology

## 2018-12-07 DIAGNOSIS — E039 Hypothyroidism, unspecified: Secondary | ICD-10-CM | POA: Insufficient documentation

## 2018-12-07 DIAGNOSIS — Z803 Family history of malignant neoplasm of breast: Secondary | ICD-10-CM | POA: Insufficient documentation

## 2018-12-07 DIAGNOSIS — C773 Secondary and unspecified malignant neoplasm of axilla and upper limb lymph nodes: Secondary | ICD-10-CM | POA: Insufficient documentation

## 2018-12-07 DIAGNOSIS — Z7984 Long term (current) use of oral hypoglycemic drugs: Secondary | ICD-10-CM | POA: Diagnosis not present

## 2018-12-07 DIAGNOSIS — Z171 Estrogen receptor negative status [ER-]: Secondary | ICD-10-CM | POA: Insufficient documentation

## 2018-12-07 DIAGNOSIS — C50911 Malignant neoplasm of unspecified site of right female breast: Secondary | ICD-10-CM

## 2018-12-07 DIAGNOSIS — I10 Essential (primary) hypertension: Secondary | ICD-10-CM | POA: Insufficient documentation

## 2018-12-07 DIAGNOSIS — E119 Type 2 diabetes mellitus without complications: Secondary | ICD-10-CM | POA: Insufficient documentation

## 2018-12-07 DIAGNOSIS — C50011 Malignant neoplasm of nipple and areola, right female breast: Secondary | ICD-10-CM | POA: Insufficient documentation

## 2018-12-07 DIAGNOSIS — R197 Diarrhea, unspecified: Secondary | ICD-10-CM | POA: Insufficient documentation

## 2018-12-07 DIAGNOSIS — Z79899 Other long term (current) drug therapy: Secondary | ICD-10-CM | POA: Diagnosis not present

## 2018-12-07 DIAGNOSIS — F17210 Nicotine dependence, cigarettes, uncomplicated: Secondary | ICD-10-CM | POA: Diagnosis not present

## 2018-12-07 DIAGNOSIS — Z452 Encounter for adjustment and management of vascular access device: Secondary | ICD-10-CM | POA: Insufficient documentation

## 2018-12-07 LAB — CBC WITH DIFFERENTIAL/PLATELET
Abs Immature Granulocytes: 0.01 10*3/uL (ref 0.00–0.07)
Basophils Absolute: 0 10*3/uL (ref 0.0–0.1)
Basophils Relative: 1 %
Eosinophils Absolute: 0.1 10*3/uL (ref 0.0–0.5)
Eosinophils Relative: 4 %
HCT: 33.4 % — ABNORMAL LOW (ref 36.0–46.0)
Hemoglobin: 11.8 g/dL — ABNORMAL LOW (ref 12.0–15.0)
Immature Granulocytes: 0 %
Lymphocytes Relative: 24 %
Lymphs Abs: 0.7 10*3/uL (ref 0.7–4.0)
MCH: 36.9 pg — ABNORMAL HIGH (ref 26.0–34.0)
MCHC: 35.3 g/dL (ref 30.0–36.0)
MCV: 104.4 fL — ABNORMAL HIGH (ref 80.0–100.0)
Monocytes Absolute: 0.4 10*3/uL (ref 0.1–1.0)
Monocytes Relative: 14 %
Neutro Abs: 1.7 10*3/uL (ref 1.7–7.7)
Neutrophils Relative %: 57 %
Platelets: 126 10*3/uL — ABNORMAL LOW (ref 150–400)
RBC: 3.2 MIL/uL — ABNORMAL LOW (ref 3.87–5.11)
RDW: 18.5 % — ABNORMAL HIGH (ref 11.5–15.5)
WBC: 3 10*3/uL — ABNORMAL LOW (ref 4.0–10.5)
nRBC: 0 % (ref 0.0–0.2)

## 2018-12-07 LAB — COMPREHENSIVE METABOLIC PANEL
ALT: 30 U/L (ref 0–44)
AST: 28 U/L (ref 15–41)
Albumin: 4.4 g/dL (ref 3.5–5.0)
Alkaline Phosphatase: 50 U/L (ref 38–126)
Anion gap: 9 (ref 5–15)
BUN: 13 mg/dL (ref 8–23)
CO2: 25 mmol/L (ref 22–32)
Calcium: 9.5 mg/dL (ref 8.9–10.3)
Chloride: 102 mmol/L (ref 98–111)
Creatinine, Ser: 0.74 mg/dL (ref 0.44–1.00)
GFR calc Af Amer: >60 mL/min (ref >60)
GFR calc non Af Amer: >60 mL/min (ref >60)
Glucose, Bld: 113 mg/dL — ABNORMAL HIGH (ref 70–99)
Potassium: 3.8 mmol/L (ref 3.5–5.1)
Sodium: 136 mmol/L (ref 135–145)
Total Bilirubin: 1.9 mg/dL — ABNORMAL HIGH (ref 0.3–1.2)
Total Protein: 6.6 g/dL (ref 6.5–8.1)

## 2018-12-07 LAB — BILIRUBIN, DIRECT: Bilirubin, Direct: 0.3 mg/dL — ABNORMAL HIGH (ref 0.0–0.2)

## 2018-12-07 MED ORDER — SODIUM CHLORIDE 0.9% FLUSH
10.0000 mL | INTRAVENOUS | Status: DC | PRN
  Administered 2018-12-07: 10 mL via INTRAVENOUS
  Filled 2018-12-07: qty 10

## 2018-12-07 MED ORDER — HEPARIN SOD (PORK) LOCK FLUSH 100 UNIT/ML IV SOLN
500.0000 [IU] | Freq: Once | INTRAVENOUS | Status: AC
  Administered 2018-12-07: 500 [IU] via INTRAVENOUS

## 2018-12-07 NOTE — Progress Notes (Addendum)
[facility]  [address] [place], [place] [address] Phone: [phone]  Fax: [phone]   Clinic Day:  12/07/2018  Referring physician: Baxter Hire, MD  Chief Complaint: Stephanie Sanders is a 73 y.o. 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   HPI: The patient was last seen in the medical oncology clinic on 11/16/2018. At that time, she was doing well.  She noted unchanged diarrhea, seroma to right breast tenderness but improving and itching, tenderness to bilateral feet.  She voiced concerns about the way her left breast with healing.  Hematocrit 35.3, hemoglobin 12.6, platelets 128,000, WBC 3300.  Bilirubin 1.7  During the interim, patient states "I have been feeling okay".  Her diarrhea and fatigue remain unchanged.  She continues to have uncontrollable episodes of diarrhea often several per day while on Xeloda.  She continues to stay close to home to avoid accidents.  She denies any mouth sores.  Continues to complain of tenderness to right breast seroma.  Notes tenderness to soles of feet towards the end of her 2 weeks on Xeloda.  She begins using her cream and socks which help relieve the sensitivity.  She has questions today about to see if "the Xeloda is working".  She is also asking if Dr. Mike Gip has spoken with her surgeon regarding the healing of her right breast.   Past Medical History:  Diagnosis Date  . Cancer ([place]) 04/2017   right breast  . Diabetes mellitus without complication ([place])   . Diabetic nephropathy ([place])   . Headache    migraines  . Heart murmur    ASYMPTOMATIC  . Hypertension   . Hypothyroidism   . Personal history of chemotherapy 2019   right breast cancer  . Pneumonia   . Seborrheic keratosis   . Vitamin D deficiency     Past Surgical History:  Procedure Laterality Date  . APPENDECTOMY    . AXILLARY LYMPH NODE  DISSECTION Right 02/01/2018   Procedure: AXILLARY LYMPH NODE DISSECTION;  Surgeon: Herbert Pun, MD;  Location: [facility] ORS;  Service: General;  Laterality: Right;  . BREAST BIOPSY Right 05/28/2017   right breast bx 3 areas invasive mamm ca lymph node mets  . BREAST CYST ASPIRATION Bilateral    neg  . BREAST LUMPECTOMY Right 02/01/2018  . CHOLECYSTECTOMY    . COLONOSCOPY WITH PROPOFOL N/A 01/01/2015   Procedure: COLONOSCOPY WITH PROPOFOL;  Surgeon: Josefine Class, MD;  Location: [facility] ENDOSCOPY;  Service: Endoscopy;  Laterality: N/A;  . EYE SURGERY Left    eyelid  . PARTIAL MASTECTOMY WITH NEEDLE LOCALIZATION Right 02/01/2018   Procedure: PARTIAL MASTECTOMY WITH NEEDLE LOCALIZATION;  Surgeon: Herbert Pun, MD;  Location: [facility] ORS;  Service: General;  Laterality: Right;  . PORTACATH PLACEMENT Right 06/10/2017   Procedure: INSERTION PORT-A-CATH;  Surgeon: Herbert Pun, MD;  Location: [facility] ORS;  Service: General;  Laterality: Right;  . SENTINEL NODE BIOPSY Right 02/01/2018   Procedure: SENTINEL NODE BIOPSY;  Surgeon: Herbert Pun, MD;  Location: [facility] ORS;  Service: General;  Laterality: Right;  . STENT PLACEMENT ILIAC ([place] [facility])     temporary placement s/p cholecystectomy  . TUBAL LIGATION      Family History  Problem Relation Age of Onset  . Parkinson's disease Mother   . Cancer Maternal Aunt   . Cancer Father   . Lung cancer Father   . Breast cancer Neg Hx  Social History:  reports that she quit smoking about 46 years ago. Her smoking use included cigarettes. She has a 20.00 pack-year smoking history. She has never used smokeless tobacco. She reports that she does not drink alcohol or use drugs. She has 3 children (daughters: age 68 and 79; son Jaquelyn Bitter): age 58). She is retired. She worked for the [employer]. Her husband has dementia. She lives in [place]. The patient is alone today.  Allergies:  Allergies  Allergen Reactions  . Fiorinal  [Butalbital-Aspirin-Caffeine] Nausea And Vomiting and Other (See Comments)    SEVERE HEADACHE  . Sulfa Antibiotics Other (See Comments)    Burning esophagus and stomach  . Tramadol Nausea And Vomiting  . Other Rash    STERI-STRIPS-RASH    Current Medications: Current Outpatient Medications  Medication Sig Dispense Refill  . acetaminophen (TYLENOL) 500 MG tablet Take 1,000 mg by mouth every 6 (six) hours as needed for moderate pain or headache.     Marland Kitchen atorvastatin (LIPITOR) 10 MG tablet Take 10 mg by mouth every evening.     . capecitabine (XELODA) 500 MG tablet Take 3 tablets (1,500 mg total) by mouth 2 (two) times daily after a meal. Take for 14 days then off 7 days. 84 tablet 1  . Cholecalciferol 2000 UNITS CAPS Take 4,000 Units by mouth daily.     . Lactobacillus (PROBIOTIC ACIDOPHILUS PO) Take 1 capsule by mouth daily.     Marland Kitchen levothyroxine (SYNTHROID, LEVOTHROID) 50 MCG tablet Take 50 mcg by mouth daily before breakfast.    . lidocaine-prilocaine (EMLA) cream Apply 1 application topically as needed. (Patient not taking: Reported on 11/16/2018) 30 g 6  . MAGNESIUM GLYCINATE PLUS PO Take 1 tablet by mouth at bedtime.     . metFORMIN (GLUCOPHAGE) 1000 MG tablet Take 1,000 mg by mouth 2 (two) times daily with a meal.    . quinapril (ACCUPRIL) 5 MG tablet Take 5 mg by mouth every morning.     . sodium bicarbonate 650 MG tablet Take 1,300 mg by mouth at bedtime.     No current facility-administered medications for this visit.    Facility-Administered Medications Ordered in Other Visits  Medication Dose Route Frequency Provider Last Rate Last Dose  . sodium chloride flush (NS) 0.9 % injection 10 mL  10 mL Intravenous PRN Lequita Asal, MD   10 mL at 10/21/17 0841  . sodium chloride flush (NS) 0.9 % injection 10 mL  10 mL Intravenous PRN Lequita Asal, MD   10 mL at 12/07/18 1010    Review of Systems  Constitutional: Positive for malaise/fatigue. Negative for chills, fever and  weight loss.  HENT: Negative for congestion, ear pain and tinnitus.   Eyes: Negative.  Negative for blurred vision and double vision.  Respiratory: Negative.  Negative for cough, sputum production and shortness of breath.   Cardiovascular: Negative.  Negative for chest pain, palpitations and leg swelling.  Gastrointestinal: Positive for diarrhea. Negative for abdominal pain, constipation, nausea and vomiting.  Genitourinary: Negative for dysuria, frequency and urgency.  Musculoskeletal: Negative for back pain and falls.  Skin: Positive for itching. Negative for rash.  Neurological: Positive for weakness. Negative for headaches.  Endo/Heme/Allergies: Negative.  Does not bruise/bleed easily.  Psychiatric/Behavioral: Negative.  Negative for depression. The patient is not nervous/anxious and does not have insomnia.    Performance status (ECOG): 1  Vitals There were no vitals taken for this visit.   Physical Exam  Constitutional: She is oriented to person,  place, and time. Vital signs are normal. She appears well-developed and well-nourished.  Appears fatigued  HENT:  Head: Normocephalic and atraumatic.  Eyes: Pupils are equal, round, and reactive to light.  Neck: Normal range of motion.  Cardiovascular: Normal rate and regular rhythm.  No murmur heard. Pulmonary/Chest: Effort normal and breath sounds normal. She has no wheezes.  Abdominal: Soft. Bowel sounds are normal. She exhibits no distension and no mass. There is no abdominal tenderness.  Musculoskeletal: Normal range of motion.        General: No edema.  Neurological: She is alert and oriented to person, place, and time.  Skin: Skin is warm and dry.  Psychiatric: She has a normal mood and affect. Her behavior is normal.     Right breast   No visits with results within 3 Day(s) from this visit.  Latest known visit with results is:  Orders Only on 11/16/2018  Component Date Value Ref Range Status  . CA 27.29 11/16/2018 27.5   0.0 - 38.6 U/mL Final   Comment: (NOTE) Siemens Centaur Immunochemiluminometric Methodology [facility]) Values obtained with different assay methods or kits cannot be used interchangeably. Results cannot be interpreted as absolute evidence of the presence or absence of malignant disease. Performed At: [facility] [place], [place] [id number] Rush Farmer MD [place]:[id number]     Assessment:  Stephanie Sanders is a 73 y.o. female with multi-focal triple negative clinical stage T1cN1Mx right breast cancers/p biopsy on 05/28/2017. Pathologyrevealed two grade II invasive mammary carcinomas of no special type (4 cm from the nipple and 5 cm from the nipple). Tumors were in close proximity. Lymph node biopsy confirmed metastatic carcinoma of breast origin. Tumor is ER negative (<1%), PR negative and Her 2/neu 2+ by IHC. [facility] genetic testingon 07/21/2017 revealed a variant of uncertain significance in RECQL4.  Right sided mammogram and ultrasound on 05/21/2017 revealed 2 highly suspicious masses in the right breast at the 10:30 position 4 cm from nipple (1.8 x 1.2 x 1.1 cm) and at the 10:30 position 5 cm from nipple (1.1 x 0.9 x 0.9 cm). There was a suspicious 2.7 x 1 x 2.2 cm lymph node in the right axilla with asymmetric nodular cortical thickening.  Ultrasound guided biopsyof the 2 masses and lymph node on 05/28/2017 revealed grade II invasive mammary carcinoma of no special type (4 cm from the nipple and 5 cm from the nipple). Lymph node biopsy revealed metastatic carcinoma of breast origin. Tumor is ER negative (<1%), PR negative and Her 2/neu 2+ by IHC. FISH was negative.   CA 27.29has been followed: 16.5 on 06/04/2017, 14.3 on 05/21/2018, 20.3 on 06/18/2018, and 27.5 on 11/16/2018. CA15-3was 17.6 on 06/04/2017.  [place]oon 06/09/2017 revealed an EF of 60-65%. She has enrolled on the UPBEAT clinical trial.  She received 4 cycles of AC(06/23/2017 -  09/08/2017) with Margarette Canada support. Cycle #1 was complicated by influenza A. Nadir counts included an ANC of 0 and platelet count of 71,000. Cycle #2 postponed due to slow recovery from influenza and excisional cyst removal with surrounding cellulitis (treated with 10 days of doxycycline).   She received 12 weeks of Taxol (09/29/2017 - 01/05/2018). She received GCSFafter week #3 and 5 Taxol secondary to borderline counts. Cycle #5 was postponed on 10/29/2017 due to patient's wishes to be treated on Tuesdays only. Week #7 Taxol was held secondary to neutropenia (ANC 200).  Right mammogram and ultrasoundon 09/28/2017 revealed the 2 adjacent masses in the right breast at the  10:30 o'clock position had both decreased in size (1.8 x 1.1 x 1.2 cm to 1.2 x 0.7 x 1.1 cm; 11 x 9 x 9 mm to 7 x 6 x 6 mm) as well as the metastatic right axillary lymph node (2.7 cm to 1.6 x 0.9 x 1.7 cm).   Right partial mastectomyand sentinel lymph node biopsy on 02/01/2018 Pathology revealed 10 mm grade II invasive carcinoma of no special type. The tumor bed encompassed the biopsy sites, with foci of residual carcinoma at both biopsy sites and also involving the tissue between the biopsy sites. Foci of residual carcinoma range from 1 to 10 mm.There was no DCIS. Distance from closest margin was 1 mm. Four of 4 lymph nodes were negative. Pathologic stage was ypT1b (m) ypN0 (sn).  She received breast radiationfrom 03/10/2018 - 05/07/2018.  She iss/pcycle #6Xeloda(07/06/2018 - 11/16/2018).  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 Normal labs on 11/24/2017 included: B12, folate, TSH, and copper.  Bone densityon 07/01/2018 was normal witha T score of -0.8 and the RIGHTfemoral neck.  [facility] with with fever, cough and generalized weakness. Nasal swab was + forinfluenza A. She completed a course of Tamiflu.  Symptomatically, she is doing  well.  She continues to have intermittent diarrhea and sensitive/tender feet.  Plan: 1.   Labs today: CBC with diff, CMP, direct bilirubin. 2.Multifocal triple negative RIGHT breast cancer Clinically, she is doing well.   She is s/p 6 cycles of Xeloda with manageable side effects.  She continues on low-dose Xeloda (1500 mg twice daily 2 weeks on/1 week off). Plan is for a total of 8 cycles of adjuvant Xeloda. Begin cycle # 7 Xeloda tomorrow. We will touch base with Dr. Mike Gip regarding imaging.  Explained that given her tumor marker CA-27.29 has been stable and she has not developed new symptoms, imaging would likely be scheduled at the end of cycle 8 Xeloda.  Will confirm. Discuss symptom management.  She has antiemetics and pain medications at home to use on a prn bases.  Interventions are adequate.     3. Diarrhea  Diarrhea is well controlled. No intervention is necessary.  She has taken Imodium one time with previous cycle. Continue to monitor. 4.Right breast seroma Continues to express concerns about healing and leaking of right breast seroma.  Touch base with Dr. Mike Gip to see if she reached out to surgeon last visit. 5.Elevated bilirubin             Bilirubin is 1.9 (direct 0.3).  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  Continue to monitor.             6.   RTC in 3 weeks for MD assessment and labs (CBC with diff, CMP, direct bilirubin).  Addendum: I messaged Dr. Zachery Dauer regarding patient sensitivity and tenderness to right breast.  This was his response after reviewing photo taken by Dr. Mike Gip:  I think that at this point it will not improve much unless it is excised. I have been following her for this and excision has been offered. It improves and then she decides that she wants to continue observation. I can see her again to discuss about excision. Tell her to call my office to make an appointment is she wants me to take a look at it and  discuss excision.   Called patient to discuss but unavailable so I left a voicemail.  Will wait for her to call back.  I discussed the assessment and treatment plan with the patient.  The patient was provided an opportunity to ask questions and all were answered.  The patient agreed with the plan and demonstrated an understanding of the instructions.  The patient was advised to call back if the symptoms worsen or if the condition fails to improve as anticipated.  I provided 25 minutes of face-to-face time during this this encounter and > 50% was spent counseling as documented under my assessment and plan.   Faythe Casa, NP 12/07/2018 11:35 AM

## 2018-12-14 ENCOUNTER — Ambulatory Visit: Payer: Medicare Other | Admitting: Hematology and Oncology

## 2018-12-14 ENCOUNTER — Other Ambulatory Visit: Payer: Medicare Other

## 2018-12-26 NOTE — Progress Notes (Signed)
[facility]  [address] [place], [place] [address] Phone: [phone]  Fax: [phone]   Clinic Day:  12/28/2018  Referring physician: Baxter Hire, MD  Chief Complaint: Stephanie Sanders is a 73 y.o. 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   HPI: The patient was last seen in the medical oncology clinic by Stephanie Casa, NP on 12/07/2018. At that time, she was doing well. She continued to have intermittent diarrhea and sensitive/tender feet. Hematocrit was 33.4, hemoglobin 11.8, platelets 126,000, WBC 3,000. Bilirubin was 1.9 (direct 0.3).  Cycle #7 of Xeloda began on 12/08/2018.   During the interim, the patient felt "ok". She has loose stool, well managed.  She uses Imodium once with each cycle.  She denies any mouth sores.  She states that at the end of the cycle, she has sore/burning feet for about 2 - 3 days.  She denies any blisters and peeling.  Symptoms have resolved.  Her right breast seroma is sore in the mornings but later feels better after she moves around.    Past Medical History:  Diagnosis Date  . Cancer ([place]) 04/2017   right breast  . Diabetes mellitus without complication ([place])   . Diabetic nephropathy ([place])   . Headache    migraines  . Heart murmur    ASYMPTOMATIC  . Hypertension   . Hypothyroidism   . Personal history of chemotherapy 2019   right breast cancer  . Pneumonia   . Seborrheic keratosis   . Vitamin D deficiency     Past Surgical History:  Procedure Laterality Date  . APPENDECTOMY    . AXILLARY LYMPH NODE DISSECTION Right 02/01/2018   Procedure: AXILLARY LYMPH NODE DISSECTION;  Surgeon: Stephanie Pun, MD;  Location: [facility] ORS;  Service: General;  Laterality: Right;  . BREAST BIOPSY Right 05/28/2017   right breast bx 3 areas invasive mamm ca lymph node mets  . BREAST CYST ASPIRATION Bilateral    neg  . BREAST  LUMPECTOMY Right 02/01/2018  . CHOLECYSTECTOMY    . COLONOSCOPY WITH PROPOFOL N/A 01/01/2015   Procedure: COLONOSCOPY WITH PROPOFOL;  Surgeon: Stephanie Class, MD;  Location: [facility] ENDOSCOPY;  Service: Endoscopy;  Laterality: N/A;  . EYE SURGERY Left    eyelid  . PARTIAL MASTECTOMY WITH NEEDLE LOCALIZATION Right 02/01/2018   Procedure: PARTIAL MASTECTOMY WITH NEEDLE LOCALIZATION;  Surgeon: Stephanie Pun, MD;  Location: [facility] ORS;  Service: General;  Laterality: Right;  . PORTACATH PLACEMENT Right 06/10/2017   Procedure: INSERTION PORT-A-CATH;  Surgeon: Stephanie Pun, MD;  Location: [facility] ORS;  Service: General;  Laterality: Right;  . SENTINEL NODE BIOPSY Right 02/01/2018   Procedure: SENTINEL NODE BIOPSY;  Surgeon: Stephanie Pun, MD;  Location: [facility] ORS;  Service: General;  Laterality: Right;  . STENT PLACEMENT ILIAC ([place] HX)     temporary placement s/p cholecystectomy  . TUBAL LIGATION      Family History  Problem Relation Age of Onset  . Parkinson's disease Mother   . Cancer Maternal Aunt   . Cancer Father   . Lung cancer Father   . Breast cancer Neg Hx     Social History:  reports that she quit smoking about 46 years ago. Her smoking use included cigarettes. She has a 20.00 pack-year smoking history. She has never used smokeless tobacco. She reports that she does not drink alcohol or use drugs. She reports that she does  not drink alcohol or use drugs. She has 3 children (daughters: age 54 and 27; son Stephanie Sanders): age 24). She is retired. She worked for the [employer]. Her husband has dementia. She lives in [place]. The patient is alone today.  Allergies:  Allergies  Allergen Reactions  . Fiorinal [Butalbital-Aspirin-Caffeine] Nausea And Vomiting and Other (See Comments)    SEVERE HEADACHE  . Sulfa Antibiotics Other (See Comments)    Burning esophagus and stomach  . Tramadol Nausea And Vomiting  . Other Rash    STERI-STRIPS-RASH    Current Medications:  Current Outpatient Medications  Medication Sig Dispense Refill  . atorvastatin (LIPITOR) 10 MG tablet Take 10 mg by mouth every evening.     . capecitabine (XELODA) 500 MG tablet Take 3 tablets (1,500 mg total) by mouth 2 (two) times daily after a meal. Take for 14 days then off 7 days. 84 tablet 1  . Cholecalciferol 2000 UNITS CAPS Take 4,000 Units by mouth daily.     . Lactobacillus (PROBIOTIC ACIDOPHILUS PO) Take 1 capsule by mouth daily.     Marland Kitchen levothyroxine (SYNTHROID, LEVOTHROID) 50 MCG tablet Take 50 mcg by mouth daily before breakfast.    . MAGNESIUM GLYCINATE PLUS PO Take 1 tablet by mouth at bedtime.     . metFORMIN (GLUCOPHAGE) 1000 MG tablet Take 1,000 mg by mouth 2 (two) times daily with a meal.    . quinapril (ACCUPRIL) 5 MG tablet Take 5 mg by mouth every morning.     . sodium bicarbonate 650 MG tablet Take 1,300 mg by mouth at bedtime.    Marland Kitchen acetaminophen (TYLENOL) 500 MG tablet Take 1,000 mg by mouth every 6 (six) hours as needed for moderate pain or headache.     . lidocaine-prilocaine (EMLA) cream Apply 1 application topically as needed. (Patient not taking: Reported on 11/16/2018) 30 g 6   No current facility-administered medications for this visit.    Facility-Administered Medications Ordered in Other Visits  Medication Dose Route Frequency Provider Last Rate Last Dose  . sodium chloride flush (NS) 0.9 % injection 10 mL  10 mL Intravenous PRN Stephanie Asal, MD   10 mL at 10/21/17 0841    Review of Systems  Constitutional: Positive for weight loss (1 lb). Negative for chills, fever and malaise/fatigue.       Feels "okay".  HENT: Negative.  Negative for congestion, ear discharge, ear pain, hearing loss, nosebleeds, sinus pain and sore throat.   Eyes: Negative.  Negative for blurred vision and double vision.  Respiratory: Negative.  Negative for cough, sputum production and shortness of breath.   Cardiovascular: Negative.  Negative for chest pain, palpitations and leg  swelling.  Gastrointestinal: Positive for diarrhea (loose stool). Negative for abdominal pain, blood in stool, constipation, melena, nausea and vomiting.  Genitourinary: Negative.  Negative for dysuria, frequency, hematuria and urgency.  Musculoskeletal: Negative.  Negative for back pain and falls.  Skin: Negative.  Negative for itching and rash.  Neurological: Positive for sensory change (feet tenderness for 2-3 days with each cycle). Negative for dizziness, tingling, weakness and headaches.  Endo/Heme/Allergies: Negative.  Does not bruise/bleed easily.  Psychiatric/Behavioral: Negative.  Negative for depression. The patient is not nervous/anxious and does not have insomnia.   All other systems reviewed and are negative.  Performance status (ECOG): 1  Vitals Blood pressure (!) 111/50, pulse 81, temperature (!) 97.3 F (36.3 C), temperature source Tympanic, resp. rate 18, height 5\' 8"  (1.727 m), weight 145 lb 6.3 oz (65.9  kg), SpO2 100 %.  Physical Exam  Constitutional: She is oriented to person, place, and time. Vital signs are normal. She appears well-developed and well-nourished.  HENT:  Head: Normocephalic and atraumatic.  Mouth/Throat: Oropharynx is clear and moist. No oropharyngeal exudate.  Short white hair. Wearing a mask.  Eyes: Pupils are equal, round, and reactive to light. Conjunctivae and EOM are normal. No scleral icterus.  Neck: Normal range of motion. Neck supple. No JVD present.  Cardiovascular: Normal rate, regular rhythm and normal heart sounds.  No murmur heard. Pulmonary/Chest: Effort normal and breath sounds normal. No respiratory distress. She has no wheezes. She has no rales.  Abdominal: Soft. Bowel sounds are normal. She exhibits no distension and no mass. There is no abdominal tenderness. There is no rebound and no guarding.  Musculoskeletal: Normal range of motion.        General: No tenderness or edema.  Lymphadenopathy:    She has no cervical adenopathy.     She has no axillary adenopathy.       Right: No supraclavicular adenopathy present.       Left: No supraclavicular adenopathy present.  Neurological: She is alert and oriented to person, place, and time.  Skin: Skin is warm and dry. No rash noted. No erythema. No pallor.  Fingertips slightly rough without desquamation.  Psychiatric: She has a normal mood and affect. Her behavior is normal. Judgment and thought content normal.  Nursing note and vitals reviewed.      Right breast    Appointment on 12/28/2018  Component Date Value Ref Range Status  . Bilirubin, Direct 12/28/2018 0.3* 0.0 - 0.2 mg/dL Final   Performed at [facility], [address], [place], [place] [address]  . Sodium 12/28/2018 135  135 - 145 mmol/L Final  . Potassium 12/28/2018 4.2  3.5 - 5.1 mmol/L Final  . Chloride 12/28/2018 100  98 - 111 mmol/L Final  . CO2 12/28/2018 25  22 - 32 mmol/L Final  . Glucose, Bld 12/28/2018 185* 70 - 99 mg/dL Final  . BUN 12/28/2018 11  8 - 23 mg/dL Final  . Creatinine, Ser 12/28/2018 0.85  0.44 - 1.00 mg/dL Final  . Calcium 12/28/2018 9.3  8.9 - 10.3 mg/dL Final  . Total Protein 12/28/2018 6.4* 6.5 - 8.1 g/dL Final  . Albumin 12/28/2018 4.3  3.5 - 5.0 g/dL Final  . AST 12/28/2018 29  15 - 41 U/L Final  . ALT 12/28/2018 32  0 - 44 U/L Final  . Alkaline Phosphatase 12/28/2018 56  38 - 126 U/L Final  . Total Bilirubin 12/28/2018 2.4* 0.3 - 1.2 mg/dL Final  . GFR calc non Af Amer 12/28/2018 >60  >60 mL/min Final  . GFR calc Af Amer 12/28/2018 >60  >60 mL/min Final  . Anion gap 12/28/2018 10  5 - 15 Final   Performed at [facility], [address] [place], [place] [address]  . WBC 12/28/2018 3.2* 4.0 - 10.5 K/uL Final  . RBC 12/28/2018 3.11* 3.87 - 5.11 MIL/uL Final  . Hemoglobin 12/28/2018 11.8* 12.0 - 15.0 g/dL Final  . HCT 12/28/2018 33.1* 36.0 - 46.0 % Final  . MCV 12/28/2018 106.4* 80.0 - 100.0 fL Final  . MCH 12/28/2018 37.9* 26.0 - 34.0 pg  Final  . MCHC 12/28/2018 35.6  30.0 - 36.0 g/dL Final  . RDW 12/28/2018 17.8* 11.5 - 15.5 % Final  . Platelets 12/28/2018 130* 150 - 400 K/uL Final  . nRBC  12/28/2018 0.0  0.0 - 0.2 % Final  . Neutrophils Relative % 12/28/2018 63  % Final  . Neutro Abs 12/28/2018 2.0  1.7 - 7.7 K/uL Final  . Lymphocytes Relative 12/28/2018 21  % Final  . Lymphs Abs 12/28/2018 0.7  0.7 - 4.0 K/uL Final  . Monocytes Relative 12/28/2018 13  % Final  . Monocytes Absolute 12/28/2018 0.4  0.1 - 1.0 K/uL Final  . Eosinophils Relative 12/28/2018 3  % Final  . Eosinophils Absolute 12/28/2018 0.1  0.0 - 0.5 K/uL Final  . Basophils Relative 12/28/2018 0  % Final  . Basophils Absolute 12/28/2018 0.0  0.0 - 0.1 K/uL Final  . Immature Granulocytes 12/28/2018 0  % Final  . Abs Immature Granulocytes 12/28/2018 0.01  0.00 - 0.07 K/uL Final   Performed at [facility], [address], [place], [place] [address]    Assessment:  KAYDAN MANCIAS is a 73 y.o. female with multi-focal triple negative clinical stage T1cN1Mx right breast cancers/p biopsy on 05/28/2017. Pathologyrevealed two grade II invasive mammary carcinomas of no special type (4 cm from the nipple and 5 cm from the nipple). Tumors were in close proximity. Lymph node biopsy confirmed metastatic carcinoma of breast origin. Tumor is ER negative (<1%), PR negative and Her 2/neu 2+ by IHC. [facility] genetic testingon 07/21/2017 revealed a variant of uncertain significance in RECQL4.  Right sided mammogram and ultrasound on 05/21/2017 revealed 2 highly suspicious masses in the right breast at the 10:30 position 4 cm from nipple (1.8 x 1.2 x 1.1 cm) and at the 10:30 position 5 cm from nipple (1.1 x 0.9 x 0.9 cm). There was a suspicious 2.7 x 1 x 2.2 cm lymph node in the right axilla with asymmetric nodular cortical thickening.  Ultrasound guided biopsyof the 2 masses and lymph node on 05/28/2017 revealed grade II invasive mammary carcinoma of no  special type (4 cm from the nipple and 5 cm from the nipple). Lymph node biopsy revealed metastatic carcinoma of breast origin. Tumor is ER negative (<1%), PR negative and Her 2/neu 2+ by IHC. FISH was negative.   CA 27.29has been followed: 16.5 on 06/04/2017, 14.3 on 05/21/2018, 20.3 on 06/18/2018, and 27.5 on 11/16/2018. CA15-3was 17.6 on 06/04/2017.  Echoon 06/09/2017 revealed an EF of 60-65%. She has enrolled on the UPBEAT clinical trial.  She received 4 cycles of AC(06/23/2017 - 09/08/2017) with Stephanie Sanders support. Cycle #1 was complicated by influenza A. Nadir counts included an ANC of 0 and platelet count of 71,000. Cycle #2 postponed due to slow recovery from influenza and excisional cyst removal with surrounding cellulitis (treated with 10 days of doxycycline).   She received 12 weeks of Taxol (09/29/2017 - 01/05/2018). She received GCSFafter week #3 and 5 Taxol secondary to borderline counts. Cycle #5 was postponed on 10/29/2017 due to patient's wishes to be treated on Tuesdays only. Week #7 Taxol was held secondary to neutropenia (ANC 200).  Right mammogram and ultrasoundon 09/28/2017 revealed the 2 adjacent masses in the right breast at the 10:30 o'clock position had both decreased in size (1.8 x 1.1 x 1.2 cm to 1.2 x 0.7 x 1.1 cm; 11 x 9 x 9 mm to 7 x 6 x 6 mm) as well as the metastatic right axillary lymph node (2.7 cm to 1.6 x 0.9 x 1.7 cm).   Right partial mastectomyand sentinel lymph node biopsy on 02/01/2018 Pathology revealed 10 mm grade II invasive carcinoma of no special type. The tumor bed  encompassed the biopsy sites, with foci of residual carcinoma at both biopsy sites and also involving the tissue between the biopsy sites. Foci of residual carcinoma range from 1 to 10 mm.There was no DCIS. Distance from closest margin was 1 mm. Four of 4 lymph nodes were negative. Pathologic stage was ypT1b (m) ypN0 (sn).  She received breast radiationfrom  03/10/2018 - 05/07/2018.  She iss/pcycle #7 Xeloda(07/06/2018 - 12/07/2018).  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 Normal labs on 11/24/2017 included: B12, folate, TSH, and copper.  Bone densityon 07/01/2018 was normal witha T score of -0.8 and the RIGHTfemoral neck.  [facility] with with fever, cough and generalized weakness. Nasal swab was + forinfluenza A. She completed a course of Tamiflu.  Symptomatically, she is doing well.  Exam is stable.  Plan: 1.   Labs today: CBC with diff, CMP, direct bilirubin. 2.Multifocal triple negative RIGHT breast cancer Clinically she is doing well.   She is status post 7 cycles of Xeloda.    Diarrhea is manageable.    She has transient hand-foot syndrome.  Discuss plan to complete adjuvant Xeloda with cycle #8 beginning tomorrow.  Discuss symptom management.  She has antiemetics and pain medications at home to use on a prn bases.  Interventions are adequate.      Schedule bilateral diagnostic mammogram and ultrasound. 3. Diarrhea Diarrhea is well controlled.   He takes Imodium once with each cycle.   No intervention necessary. 4.Right breast seroma Patient continues to have a periodic right breast draining seroma.    Patient to set up appointment with Dr. Windell Moment. 5.Elevated bilirubin Bilirubin is 2.4 (direct 0.3).             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 6.Continue port flushes every 6-8 weeks. 7.   RTC in 3 months for MD assessment, labs (CBC with diff, CMP, CA27.29), and review of mammogram.  I discussed the assessment and treatment plan with the patient.  The patient was provided an opportunity to ask questions and all were answered.  The patient agreed with the plan and demonstrated an understanding of the instructions.  The patient was advised to call back if the symptoms worsen or if the condition  fails to improve as anticipated.  I provided 23 minutes of face-to-face time during this this encounter and > 50% was spent counseling as documented under my assessment and plan.    Stephanie Asal, MD, PhD    12/28/2018, 11:48 AM  I, Stephanie Sanders, am acting as scribe for [employer]. Stephanie Gip, MD, PhD.  I, Melissa C. Stephanie Gip, MD, have reviewed the above documentation for accuracy and completeness, and I agree with the above.

## 2018-12-28 ENCOUNTER — Inpatient Hospital Stay: Payer: Medicare Other

## 2018-12-28 ENCOUNTER — Other Ambulatory Visit: Payer: Self-pay

## 2018-12-28 ENCOUNTER — Encounter: Payer: Self-pay | Admitting: Hematology and Oncology

## 2018-12-28 ENCOUNTER — Inpatient Hospital Stay: Payer: Medicare Other | Attending: Hematology and Oncology | Admitting: Hematology and Oncology

## 2018-12-28 VITALS — BP 111/50 | HR 81 | Temp 97.3°F | Resp 18 | Ht 68.0 in | Wt 145.4 lb

## 2018-12-28 DIAGNOSIS — N6489 Other specified disorders of breast: Secondary | ICD-10-CM | POA: Diagnosis not present

## 2018-12-28 DIAGNOSIS — Z7189 Other specified counseling: Secondary | ICD-10-CM | POA: Diagnosis not present

## 2018-12-28 DIAGNOSIS — R17 Unspecified jaundice: Secondary | ICD-10-CM | POA: Diagnosis not present

## 2018-12-28 DIAGNOSIS — C50912 Malignant neoplasm of unspecified site of left female breast: Secondary | ICD-10-CM | POA: Diagnosis present

## 2018-12-28 DIAGNOSIS — Z801 Family history of malignant neoplasm of trachea, bronchus and lung: Secondary | ICD-10-CM | POA: Diagnosis not present

## 2018-12-28 DIAGNOSIS — L271 Localized skin eruption due to drugs and medicaments taken internally: Secondary | ICD-10-CM | POA: Insufficient documentation

## 2018-12-28 DIAGNOSIS — Z79899 Other long term (current) drug therapy: Secondary | ICD-10-CM | POA: Diagnosis not present

## 2018-12-28 DIAGNOSIS — C50911 Malignant neoplasm of unspecified site of right female breast: Secondary | ICD-10-CM

## 2018-12-28 DIAGNOSIS — Z923 Personal history of irradiation: Secondary | ICD-10-CM | POA: Diagnosis not present

## 2018-12-28 DIAGNOSIS — Z171 Estrogen receptor negative status [ER-]: Secondary | ICD-10-CM

## 2018-12-28 DIAGNOSIS — Z9011 Acquired absence of right breast and nipple: Secondary | ICD-10-CM | POA: Insufficient documentation

## 2018-12-28 DIAGNOSIS — Z809 Family history of malignant neoplasm, unspecified: Secondary | ICD-10-CM | POA: Diagnosis not present

## 2018-12-28 DIAGNOSIS — R197 Diarrhea, unspecified: Secondary | ICD-10-CM | POA: Diagnosis not present

## 2018-12-28 DIAGNOSIS — Z87891 Personal history of nicotine dependence: Secondary | ICD-10-CM | POA: Diagnosis not present

## 2018-12-28 LAB — COMPREHENSIVE METABOLIC PANEL
ALT: 32 U/L (ref 0–44)
AST: 29 U/L (ref 15–41)
Albumin: 4.3 g/dL (ref 3.5–5.0)
Alkaline Phosphatase: 56 U/L (ref 38–126)
Anion gap: 10 (ref 5–15)
BUN: 11 mg/dL (ref 8–23)
CO2: 25 mmol/L (ref 22–32)
Calcium: 9.3 mg/dL (ref 8.9–10.3)
Chloride: 100 mmol/L (ref 98–111)
Creatinine, Ser: 0.85 mg/dL (ref 0.44–1.00)
GFR calc Af Amer: >60 mL/min (ref >60)
GFR calc non Af Amer: >60 mL/min (ref >60)
Glucose, Bld: 185 mg/dL — ABNORMAL HIGH (ref 70–99)
Potassium: 4.2 mmol/L (ref 3.5–5.1)
Sodium: 135 mmol/L (ref 135–145)
Total Bilirubin: 2.4 mg/dL — ABNORMAL HIGH (ref 0.3–1.2)
Total Protein: 6.4 g/dL — ABNORMAL LOW (ref 6.5–8.1)

## 2018-12-28 LAB — CBC WITH DIFFERENTIAL/PLATELET
Abs Immature Granulocytes: 0.01 10*3/uL (ref 0.00–0.07)
Basophils Absolute: 0 10*3/uL (ref 0.0–0.1)
Basophils Relative: 0 %
Eosinophils Absolute: 0.1 10*3/uL (ref 0.0–0.5)
Eosinophils Relative: 3 %
HCT: 33.1 % — ABNORMAL LOW (ref 36.0–46.0)
Hemoglobin: 11.8 g/dL — ABNORMAL LOW (ref 12.0–15.0)
Immature Granulocytes: 0 %
Lymphocytes Relative: 21 %
Lymphs Abs: 0.7 10*3/uL (ref 0.7–4.0)
MCH: 37.9 pg — ABNORMAL HIGH (ref 26.0–34.0)
MCHC: 35.6 g/dL (ref 30.0–36.0)
MCV: 106.4 fL — ABNORMAL HIGH (ref 80.0–100.0)
Monocytes Absolute: 0.4 10*3/uL (ref 0.1–1.0)
Monocytes Relative: 13 %
Neutro Abs: 2 10*3/uL (ref 1.7–7.7)
Neutrophils Relative %: 63 %
Platelets: 130 10*3/uL — ABNORMAL LOW (ref 150–400)
RBC: 3.11 MIL/uL — ABNORMAL LOW (ref 3.87–5.11)
RDW: 17.8 % — ABNORMAL HIGH (ref 11.5–15.5)
WBC: 3.2 10*3/uL — ABNORMAL LOW (ref 4.0–10.5)
nRBC: 0 % (ref 0.0–0.2)

## 2018-12-28 LAB — BILIRUBIN, DIRECT: Bilirubin, Direct: 0.3 mg/dL — ABNORMAL HIGH (ref 0.0–0.2)

## 2018-12-28 IMAGING — US US BREAST*R* LIMITED INC AXILLA
1 series · 13 of 25 positions shown · non-contrast
Comparison: Previous exam(s).

CLINICAL DATA: Patient has a history of multifocal right breast
cancer, metastatic to 1 axillary lymph node. She is post 5 cycles of
neoadjuvant chemotherapy.

EXAM:
ULTRASOUND OF THE RIGHT BREAST

[Series 1: us breast*right* limited inc axilla · 0.06mm/px · 13 of 48 slices shown]
[im 1/48]
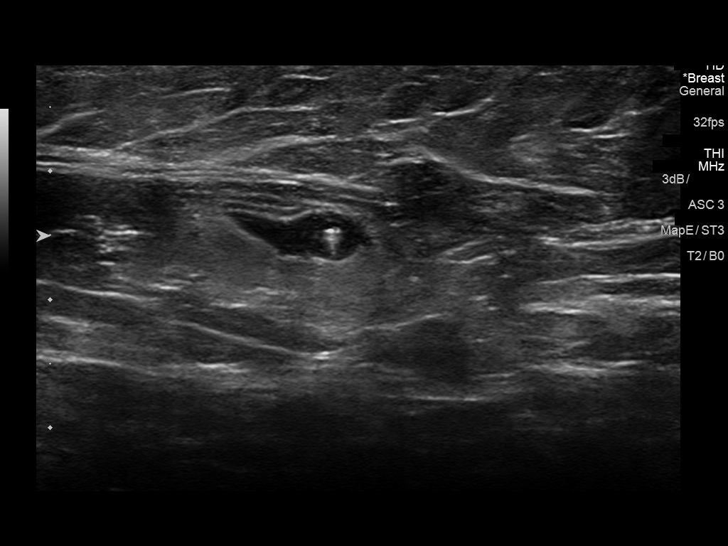
[im 4/48]
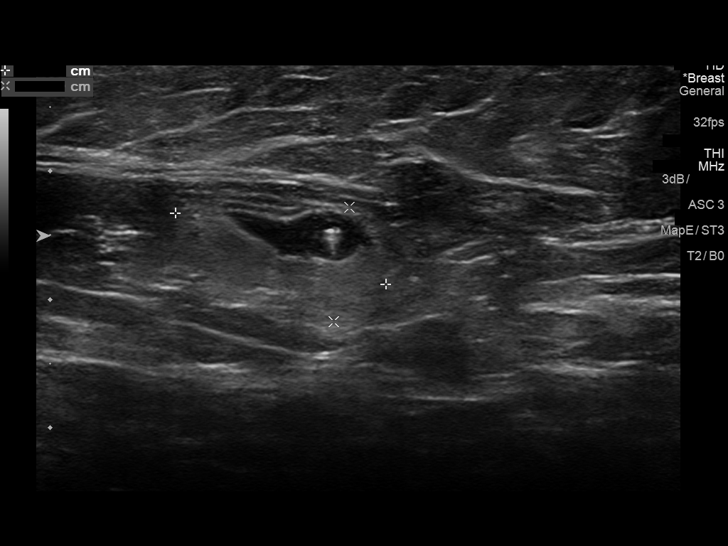
[im 8/48]
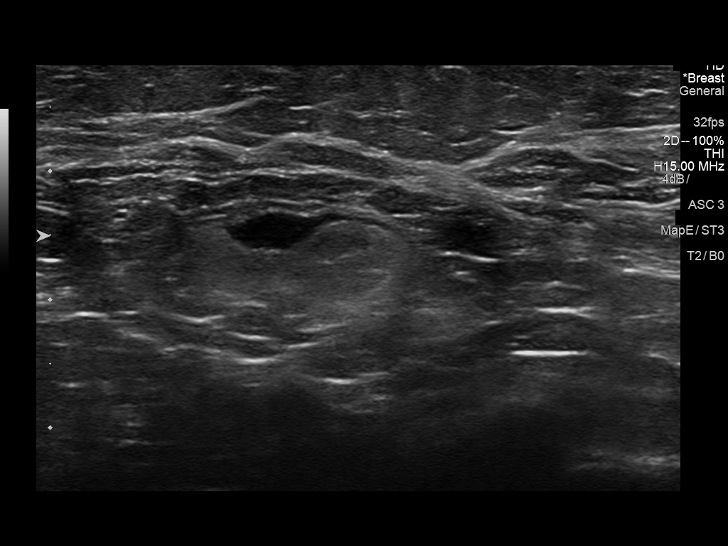
[im 12/48]
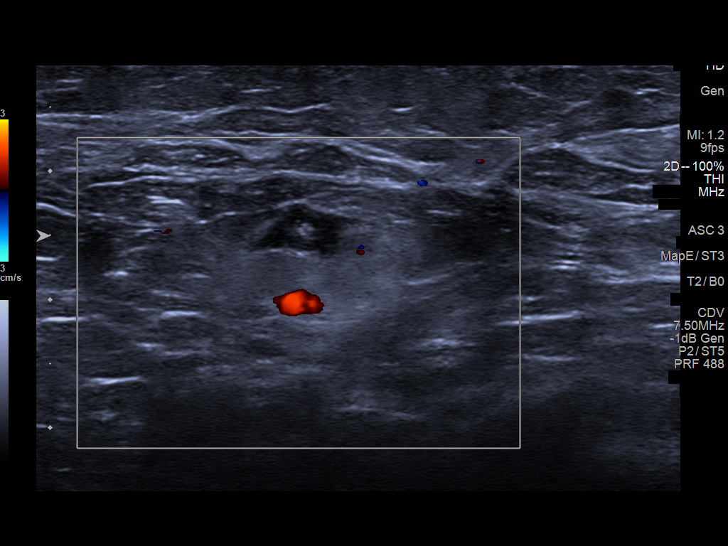
[im 16/48]
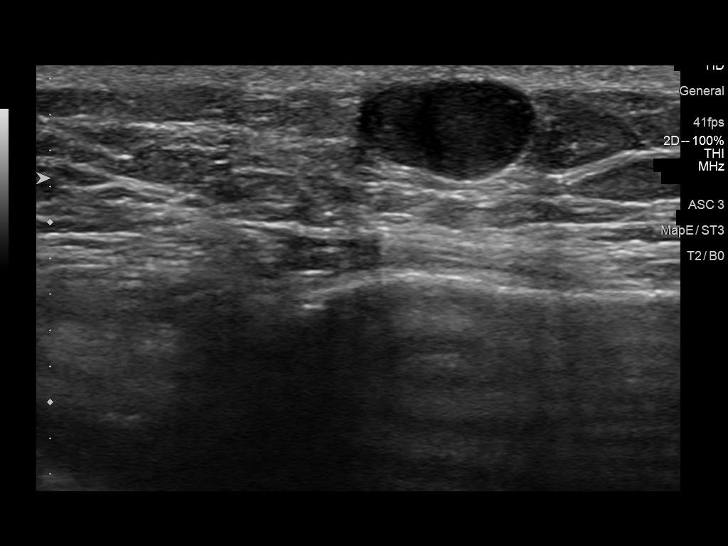
[im 20/48]
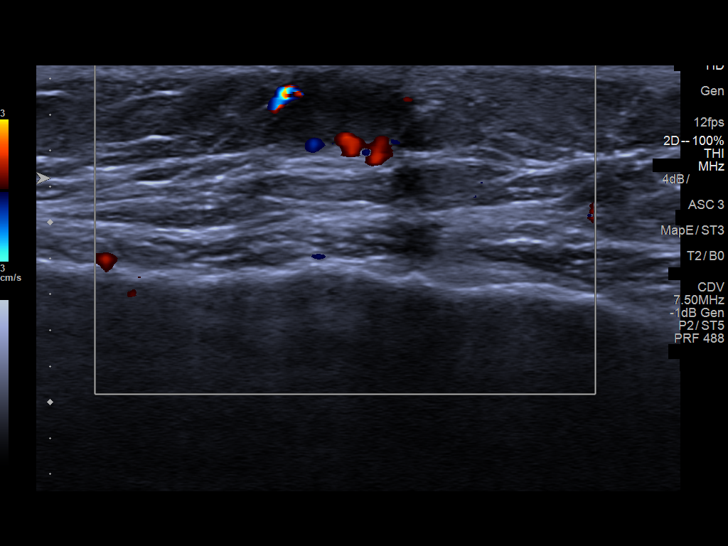
[im 24/48]
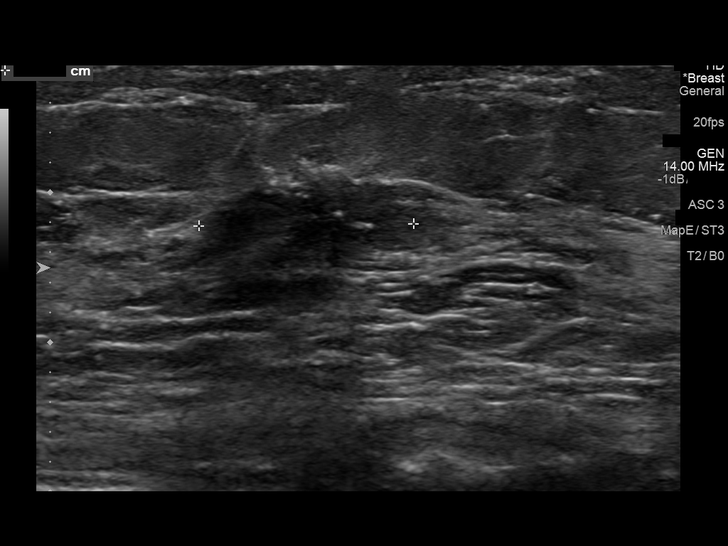
[im 28/48]
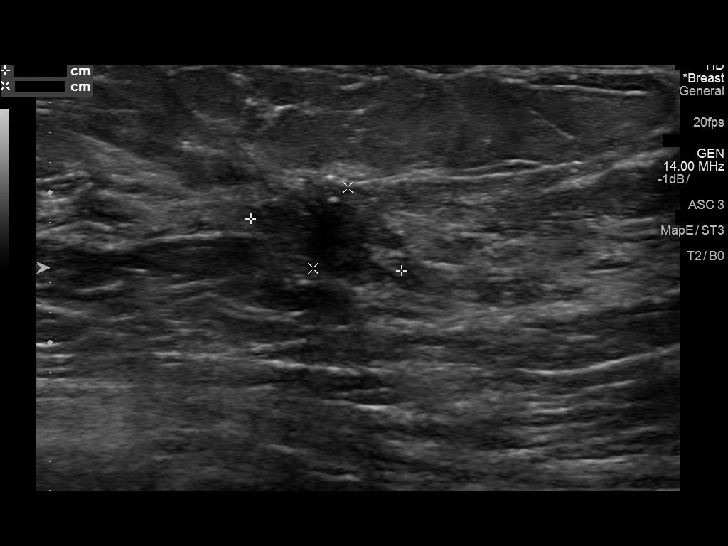
[im 32/48]
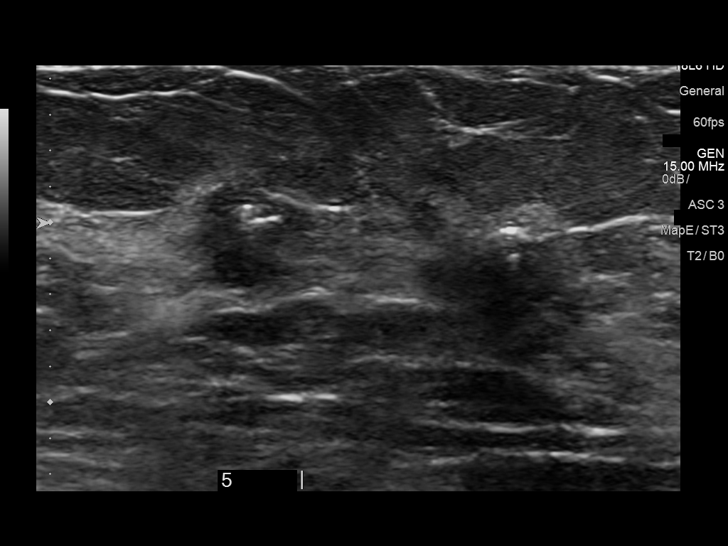
[im 36/48]
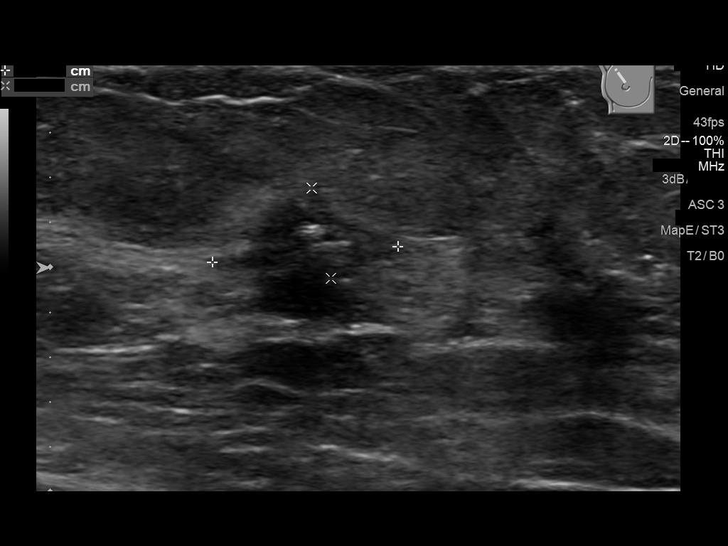
[im 40/48]
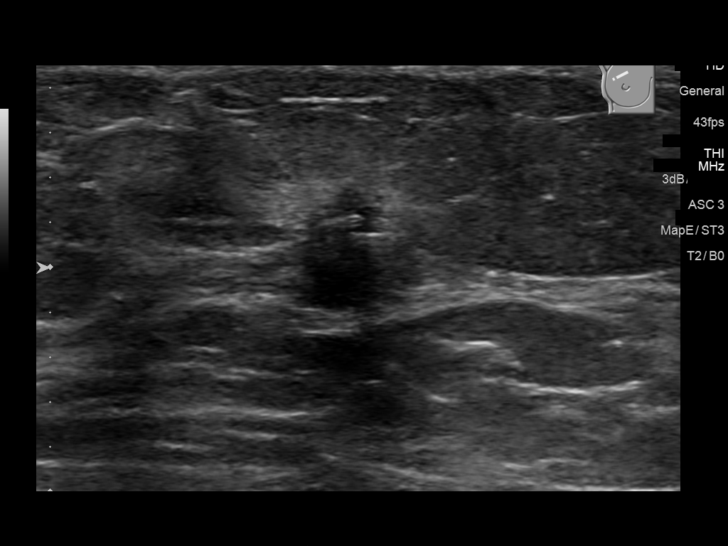
[im 44/48]
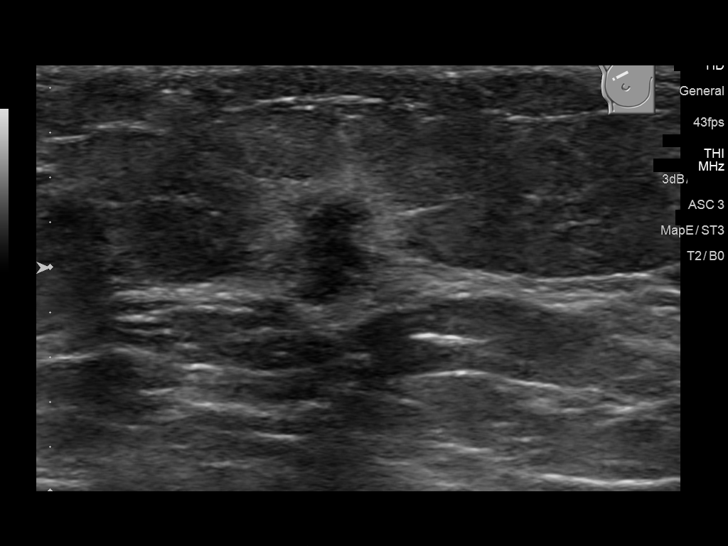
[im 48/48]
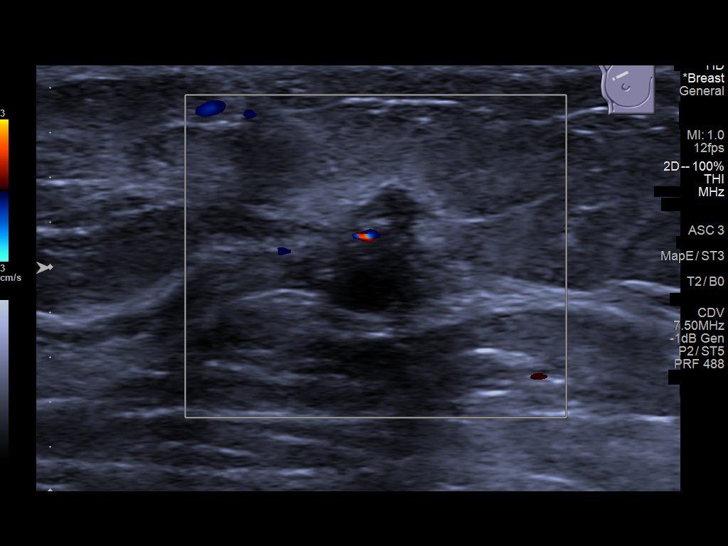

[13 of 25 positions shown; findings below may reference images not displayed]

FINDINGS: On physical exam, there is firmness of the upper-outer quadrant of
the right breast.

Targeted ultrasound is performed, showing right breast 10:30 o'clock
5 cm from the nipple irregular hypoechoic mass measuring 4 x 8 x 6
mm, prior measurement of 6 x 7 x 6 mm. Clip artifact is seen within
this mass. In the right breast 10:30 o'clock 4 cm from the nipple
there is a hypoechoic irregular mass measuring 1.1 x 0.6 x 1.4 cm,
prior measurement of 1.2 x 0.7 x 1.1 cm. Clip artifact is seen
within this mass. In the right axilla, there is a 1 lymph node with
persistently asymmetrically thickened cortex measuring 4 mm, which
contains post biopsy marker.

In the right anterior chest wall, along the right sternal border,
there is a palpable subcutaneous hypoechoic circumscribed
horizontally oriented nodule which measures 1.2 x 1.0 x 1.6 cm. This
finding has increased in size from the prior measurement of 0.7 x
0.7 x 0.6 cm.
IMPRESSION: The known malignant masses in the right 10:30 o'clock breast
demonstrate no significant change in size when compared to the most
recent ultrasound dated 09/28/2017.

The single biopsy-proven metastatic lymph node in the right axilla
is also stable in size.

Increased in size palpable subcutaneous hypoechoic circumscribed
mass along the right sternal border now measuring 1.6 cm in greatest
dimension, from the prior measurement of 0.7 cm in greatest
dimension. This may represent a benign epidermal inclusion cyst,
however given the increase in size, surgical excision at the time of
the definitive breast cancer treatment may be considered.

RECOMMENDATION:
Continue with the plan of care of known right breast cancer.

I have discussed the findings and recommendations with the patient.
Results were also provided in writing at the conclusion of the
visit. If applicable, a reminder letter will be sent to the patient
regarding the next appointment.

BI-RADS CATEGORY  6: Known biopsy-proven malignancy.

## 2018-12-28 NOTE — Progress Notes (Signed)
No new changes noted today 

## 2018-12-29 ENCOUNTER — Ambulatory Visit: Payer: Medicare Other | Admitting: Hematology and Oncology

## 2019-01-25 ENCOUNTER — Inpatient Hospital Stay: Payer: Medicare Other | Attending: Hematology and Oncology

## 2019-01-25 ENCOUNTER — Other Ambulatory Visit: Payer: Self-pay

## 2019-01-25 DIAGNOSIS — Z452 Encounter for adjustment and management of vascular access device: Secondary | ICD-10-CM | POA: Diagnosis present

## 2019-01-25 DIAGNOSIS — C50911 Malignant neoplasm of unspecified site of right female breast: Secondary | ICD-10-CM | POA: Insufficient documentation

## 2019-01-25 DIAGNOSIS — Z95828 Presence of other vascular implants and grafts: Secondary | ICD-10-CM

## 2019-01-25 MED ORDER — HEPARIN SOD (PORK) LOCK FLUSH 100 UNIT/ML IV SOLN
500.0000 [IU] | Freq: Once | INTRAVENOUS | Status: AC
  Administered 2019-01-25: 500 [IU] via INTRAVENOUS

## 2019-01-25 MED ORDER — SODIUM CHLORIDE 0.9% FLUSH
10.0000 mL | Freq: Once | INTRAVENOUS | Status: AC
  Administered 2019-01-25: 10 mL via INTRAVENOUS
  Filled 2019-01-25: qty 10

## 2019-01-27 ENCOUNTER — Ambulatory Visit
Admission: RE | Admit: 2019-01-27 | Discharge: 2019-01-27 | Disposition: A | Discharge status: Home / Self Care | Payer: Medicare Other | Source: Ambulatory Visit | Attending: Hematology and Oncology | Admitting: Hematology and Oncology

## 2019-01-27 DIAGNOSIS — Z171 Estrogen receptor negative status [ER-]: Secondary | ICD-10-CM | POA: Insufficient documentation

## 2019-01-27 DIAGNOSIS — C50911 Malignant neoplasm of unspecified site of right female breast: Secondary | ICD-10-CM

## 2019-01-27 DIAGNOSIS — N6489 Other specified disorders of breast: Secondary | ICD-10-CM | POA: Diagnosis present

## 2019-01-27 HISTORY — DX: Personal history of irradiation: Z92.3

## 2019-03-20 NOTE — Progress Notes (Signed)
[facility]  [address], [address] [place], [place] [address] Phone: [phone]  Fax: [phone]   Clinic Day:  03/22/2019  Referring physician: Baxter Hire, MD  Chief Complaint: Stephanie Sanders is a 73 y.o. 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   HPI: The patient was last seen in the medical oncology clinic on 12/28/2018. At that time, she was doing well. Exam was stable. Hematocrit 33.1, hemoglobin 11.8, platelets 130,000, WBC 3,200. Bilirubin was 2.4 (direct 0.3).  She continued on Imodium to control diarrhea.  She began her last cycle of Xeloda.  Bilateral mammogram and right unilateral ultrasound on 01/27/2019 revealed no findings to explain the palpable areas of concern. There was no evidence of malignancy in either breast.   During the interim, the patient felt "pretty good". She notes her bowel are almost normal; she is not taking any imodium. The tenderness in her feet with each cycle is slowly improving. She denies mouth sores with the last cycle of treatment.   She does not examine her breast regularly. She reports tender breasts. She denies any drainage from her right breast since 12/2018.    Past Medical History:  Diagnosis Date  . Cancer ([place]) 04/2017   right breast  . Diabetes mellitus without complication ([place])   . Diabetic nephropathy ([place])   . Headache    migraines  . Heart murmur    ASYMPTOMATIC  . Hypertension   . Hypothyroidism   . Personal history of chemotherapy 2019   right breast cancer  . Personal history of radiation therapy   . Pneumonia   . Seborrheic keratosis   . Vitamin D deficiency     Past Surgical History:  Procedure Laterality Date  . APPENDECTOMY    . AXILLARY LYMPH NODE DISSECTION Right 02/01/2018   Procedure: AXILLARY LYMPH NODE DISSECTION;  Surgeon: Herbert Pun, MD;  Location: [facility] ORS;  Service: General;  Laterality: Right;  .  BREAST BIOPSY Right 05/28/2017   right breast bx 3 areas invasive mamm ca lymph node mets  . BREAST CYST ASPIRATION Bilateral    neg  . BREAST LUMPECTOMY Right 02/01/2018  . CHOLECYSTECTOMY    . COLONOSCOPY WITH PROPOFOL N/A 01/01/2015   Procedure: COLONOSCOPY WITH PROPOFOL;  Surgeon: Josefine Class, MD;  Location: [facility] ENDOSCOPY;  Service: Endoscopy;  Laterality: N/A;  . EYE SURGERY Left    eyelid  . PARTIAL MASTECTOMY WITH NEEDLE LOCALIZATION Right 02/01/2018   Procedure: PARTIAL MASTECTOMY WITH NEEDLE LOCALIZATION;  Surgeon: Herbert Pun, MD;  Location: [facility] ORS;  Service: General;  Laterality: Right;  . PORTACATH PLACEMENT Right 06/10/2017   Procedure: INSERTION PORT-A-CATH;  Surgeon: Herbert Pun, MD;  Location: [facility] ORS;  Service: General;  Laterality: Right;  . SENTINEL NODE BIOPSY Right 02/01/2018   Procedure: SENTINEL NODE BIOPSY;  Surgeon: Herbert Pun, MD;  Location: [facility] ORS;  Service: General;  Laterality: Right;  . STENT PLACEMENT ILIAC ([place] [facility])     temporary placement s/p cholecystectomy  . TUBAL LIGATION      Family History  Problem Relation Age of Onset  . Parkinson's disease Mother   . Cancer Maternal Aunt   . Cancer Father   . Lung cancer Father   . Breast cancer Neg Hx     Social History:  reports that she quit smoking about 46 years ago. Her smoking use included cigarettes. She has a 20.00 pack-year smoking history. She has never used smokeless  tobacco. She reports that she does not drink alcohol or use drugs. She has 3 children (daughters: age 36 and 54; son Jaquelyn Bitter): age 67). She is retired. She worked for the [employer]. Her husband has dementia. She lives in [place]. The patient is alone today.  Allergies:  Allergies  Allergen Reactions  . Fiorinal [Butalbital-Aspirin-Caffeine] Nausea And Vomiting and Other (See Comments)    SEVERE HEADACHE  . Sulfa Antibiotics Other (See Comments)    Burning esophagus and stomach  .  Tramadol Nausea And Vomiting  . Other Rash    STERI-STRIPS-RASH    Current Medications: Current Outpatient Medications  Medication Sig Dispense Refill  . acetaminophen (TYLENOL) 500 MG tablet Take 1,000 mg by mouth every 6 (six) hours as needed for moderate pain or headache.     Marland Kitchen atorvastatin (LIPITOR) 10 MG tablet Take 10 mg by mouth every evening.     . Cholecalciferol 2000 UNITS CAPS Take 4,000 Units by mouth daily.     . Lactobacillus (PROBIOTIC ACIDOPHILUS PO) Take 1 capsule by mouth daily.     Marland Kitchen levothyroxine (SYNTHROID, LEVOTHROID) 50 MCG tablet Take 50 mcg by mouth daily before breakfast.    . MAGNESIUM GLYCINATE PLUS PO Take 1 tablet by mouth at bedtime.     . metFORMIN (GLUCOPHAGE) 1000 MG tablet Take 1,000 mg by mouth 2 (two) times daily with a meal.    . Multiple Vitamin (MULTIVITAMIN) tablet Take 1 tablet by mouth daily.    . quinapril (ACCUPRIL) 5 MG tablet Take 5 mg by mouth every morning.     . sodium bicarbonate 650 MG tablet Take 1,300 mg by mouth at bedtime.     No current facility-administered medications for this visit.    Facility-Administered Medications Ordered in Other Visits  Medication Dose Route Frequency Provider Last Rate Last Dose  . sodium chloride flush (NS) 0.9 % injection 10 mL  10 mL Intravenous PRN Lequita Asal, MD   10 mL at 10/21/17 0841  . sodium chloride flush (NS) 0.9 % injection 10 mL  10 mL Intravenous PRN Lequita Asal, MD   10 mL at 03/22/19 [id number]    Review of Systems  Constitutional: Negative.  Negative for chills, fever, malaise/fatigue and weight loss (up 2 pounds).       Feels "wonderful".  Back to normal.  HENT: Negative.  Negative for congestion, ear discharge, ear pain, hearing loss, nosebleeds, sinus pain and sore throat.   Eyes: Negative.  Negative for blurred vision and double vision.  Respiratory: Negative.  Negative for cough, sputum production and shortness of breath.   Cardiovascular: Positive for chest pain  (tender breast). Negative for palpitations, orthopnea and leg swelling.  Gastrointestinal: Negative for abdominal pain, blood in stool, constipation, diarrhea (loose stool not requiring Imodium), melena, nausea and vomiting.       Bowels returning back to normal.  Genitourinary: Negative.  Negative for dysuria, frequency, hematuria and urgency.  Musculoskeletal: Negative.  Negative for back pain, falls and myalgias.  Skin: Negative.  Negative for itching and rash.  Neurological: Positive for sensory change (feet tenderness secondary to Xeloda, steadily improving). Negative for dizziness, tingling, speech change, focal weakness, weakness and headaches.  Endo/Heme/Allergies: Negative.  Does not bruise/bleed easily.  Psychiatric/Behavioral: Negative.  Negative for depression and memory loss. The patient is not nervous/anxious and does not have insomnia.   All other systems reviewed and are negative.  Performance status (ECOG): 0-1  Vitals Blood pressure 136/62, pulse 75, temperature (!) 97.5  F (36.4 C), temperature source Tympanic, weight 147 lb 7.8 oz (66.9 kg), SpO2 99 %.   Physical Exam  Constitutional: She is oriented to person, place, and time. Vital signs are normal. She appears well-developed and well-nourished.  HENT:  Head: Normocephalic and atraumatic.  Mouth/Throat: Oropharynx is clear and moist. No oropharyngeal exudate.  Short gray hair.  Mask.  Eyes: Pupils are equal, round, and reactive to light. Conjunctivae and EOM are normal. No scleral icterus.  Neck: Normal range of motion. Neck supple. No JVD present.  Cardiovascular: Normal rate, regular rhythm and normal heart sounds.  No murmur heard. Pulmonary/Chest: Effort normal and breath sounds normal. No respiratory distress. She has no wheezes. She has no rales. She exhibits edema (right breast s/p radiation). Right breast exhibits tenderness. Right breast exhibits no skin change (scarring laterally). Left breast exhibits  tenderness. Left breast exhibits no skin change (fibrocystic changes medially).  Abdominal: Soft. Bowel sounds are normal. She exhibits no distension and no mass. There is no abdominal tenderness. There is no rebound and no guarding.  Musculoskeletal: Normal range of motion.        General: No tenderness or edema.  Lymphadenopathy:       Head (right side): No preauricular, no posterior auricular and no occipital adenopathy present.       Head (left side): No preauricular, no posterior auricular and no occipital adenopathy present.    She has no cervical adenopathy.    She has no axillary adenopathy.       Right: No inguinal and no supraclavicular adenopathy present.       Left: No inguinal and no supraclavicular adenopathy present.  Neurological: She is alert and oriented to person, place, and time.  Skin: Skin is warm and dry. No rash noted. No erythema. No pallor.  Heels ruddy and peeling slightly.  Psychiatric: She has a normal mood and affect. Her behavior is normal. Judgment and thought content normal.  Nursing note and vitals reviewed.   Infusion on 03/22/2019  Component Date Value Ref Range Status  . WBC 03/22/2019 3.5* 4.0 - 10.5 K/uL Final  . RBC 03/22/2019 4.06  3.87 - 5.11 MIL/uL Final  . Hemoglobin 03/22/2019 13.6  12.0 - 15.0 g/dL Final  . HCT 03/22/2019 39.3  36.0 - 46.0 % Final  . MCV 03/22/2019 96.8  80.0 - 100.0 fL Final  . MCH 03/22/2019 33.5  26.0 - 34.0 pg Final  . MCHC 03/22/2019 34.6  30.0 - 36.0 g/dL Final  . RDW 03/22/2019 11.8  11.5 - 15.5 % Final  . Platelets 03/22/2019 148* 150 - 400 K/uL Final  . nRBC 03/22/2019 0.0  0.0 - 0.2 % Final  . Neutrophils Relative % 03/22/2019 62  % Final  . Neutro Abs 03/22/2019 2.1  1.7 - 7.7 K/uL Final  . Lymphocytes Relative 03/22/2019 21  % Final  . Lymphs Abs 03/22/2019 0.7  0.7 - 4.0 K/uL Final  . Monocytes Relative 03/22/2019 10  % Final  . Monocytes Absolute 03/22/2019 0.3  0.1 - 1.0 K/uL Final  . Eosinophils  Relative 03/22/2019 6  % Final  . Eosinophils Absolute 03/22/2019 0.2  0.0 - 0.5 K/uL Final  . Basophils Relative 03/22/2019 1  % Final  . Basophils Absolute 03/22/2019 0.0  0.0 - 0.1 K/uL Final  . Immature Granulocytes 03/22/2019 0  % Final  . Abs Immature Granulocytes 03/22/2019 0.01  0.00 - 0.07 K/uL Final   Performed at [facility], [place]  [address] [place], [place] [address]    Assessment:  JAKARI KRUPPA is a 73 y.o. female with multi-focal triple negative clinical stage T1cN1Mx right breast cancers/p biopsy on 05/28/2017. Pathologyrevealed two grade II invasive mammary carcinomas of no special type (4 cm from the nipple and 5 cm from the nipple). Tumors were in close proximity. Lymph node biopsy confirmed metastatic carcinoma of breast origin. Tumor is ER negative (<1%), PR negative and Her 2/neu 2+ by IHC. [facility] genetic testingon 07/21/2017 revealed a variant of uncertain significance in RECQL4.  Right sided mammogram and ultrasound on 05/21/2017 revealed 2 highly suspicious masses in the right breast at the 10:30 position 4 cm from nipple (1.8 x 1.2 x 1.1 cm) and at the 10:30 position 5 cm from nipple (1.1 x 0.9 x 0.9 cm). There was a suspicious 2.7 x 1 x 2.2 cm lymph node in the right axilla with asymmetric nodular cortical thickening.  Ultrasound guided biopsyof the 2 masses and lymph node on 05/28/2017 revealed grade II invasive mammary carcinoma of no special type (4 cm from the nipple and 5 cm from the nipple). Lymph node biopsy revealed metastatic carcinoma of breast origin. Tumor is ER negative (<1%), PR negative and Her 2/neu 2+ by IHC. FISH was negative.   CA 27.29has been followed: 16.5 on 06/04/2017, 14.3 on 05/21/2018, 20.3 on 06/18/2018, 27.5 on 11/16/2018, and 14.0 on 03/22/2019. CA15-3was 17.6 on 06/04/2017.  [place] 06/09/2017 revealed an EF of 60-65%. She has enrolled on the UPBEAT clinical trial.  She received 4 cycles of  AC(06/23/2017 - 09/08/2017) with Margarette Canada support. Cycle #1 was complicated by influenza A. Nadir counts included an ANC of 0 and platelet count of 71,000. Cycle #2 postponed due to slow recovery from influenza and excisional cyst removal with surrounding cellulitis (treated with 10 days of doxycycline).   She received 12 weeks of Taxol (09/29/2017 - 01/05/2018). She received GCSFafter week #3 and 5 Taxol secondary to borderline counts. Cycle #5 was postponed on 10/29/2017 due to patient's wishes to be treated on Tuesdays only. Week #7 Taxol was held secondary to neutropenia (ANC 200).  Right mammogram and ultrasoundon 09/28/2017 revealed the 2 adjacent masses in the right breast at the 10:30 o'clock position had both decreased in size (1.8 x 1.1 x 1.2 cm to 1.2 x 0.7 x 1.1 cm; 11 x 9 x 9 mm to 7 x 6 x 6 mm) as well as the metastatic right axillary lymph node (2.7 cm to 1.6 x 0.9 x 1.7 cm).   Right partial mastectomyand sentinel lymph node biopsy on 02/01/2018 Pathology revealed 10 mm grade II invasive carcinoma of no special type. The tumor bed encompassed the biopsy sites, with foci of residual carcinoma at both biopsy sites and also involving the tissue between the biopsy sites. Foci of residual carcinoma range from 1 to 10 mm.There was no DCIS. Distance from closest margin was 1 mm. Four of 4 lymph nodes were negative. Pathologic stage was ypT1b (m) ypN0 (sn).  She received breast radiationfrom 03/10/2018 - 05/07/2018.  She received 8 cycles of Xeloda(07/06/2018 - 12/29/2018).  Bilateral mammogram and right unilateral ultrasound on 01/27/2019 revealed no findings to explain the palpable areas of concern. There was no evidence of malignancy in either breast.   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 Normal labs on 11/24/2017 included: B12, folate, TSH, and copper.  Bone densityon 07/01/2018 was normal witha T score of -0.8 and the RIGHTfemoral neck.   [facility] with with fever, cough and generalized weakness. Nasal swab was + forinfluenza A. She completed a course of Tamiflu.  Symptomatically, she feels good.  She has significant scarring in the right breast.  Her seroma has not drained since last visit.  CA27.29 is normal.  Plan: 1.   Labs today: CBC with diff, CMP, CA 27.29. 2.Multifocal triple negative RIGHT breast cancer She is s/p neoadjuvant chemotherapy.  4 cycles of AC and 12 weeks of Taxol (last 01/05/2018).   She is s/p right partial mastectomy and SLN biopsy (02/01/2018). She is s/p 8 cycles of Xeloda (last 12/29/2018).  Bilateral mammogram and right breast ultrasound on 01/27/2019 revealed no evidence of malignancy.    Discuss significant scarring and h/o draining seroma.   Discuss bilateral breast MRI.    Patient in agreement. Discuss the plan for surveillance.    3. Diarrhea Stool is loose s/p completion of Xeloda. No Imodium needed.   Continue to monitor. 4.Right breast seroma Patient has a right breast draining seroma.   No drainage since last visit.             Continue to monitor. 5.Port-a-cath  Discuss consideration of port-a-cath removal.  Continue port flushes every 8 weeks. 6.   Schedule bilateral breast MRI. 7.   RTC after breast MRI ([facility]) for review of MRI. 8.   RTC in 4 months for MD assessment and labs (CBC with diff, CMP, CA27.29).  I discussed the assessment and treatment plan with the patient.  The patient was provided an opportunity to ask questions and all were answered.  The patient agreed with the plan and demonstrated an understanding of the instructions.  The patient was advised to call back if the symptoms worsen or if the condition fails to improve as anticipated.  I provided 18 minutes of face-to-face time during this this encounter and > 50% was spent counseling as documented under my assessment and plan.     Lequita Asal, MD, PhD    03/22/2019, 9:54 AM  I, Selena Batten, am acting as scribe for [employer]. Mike Gip, MD, PhD.  I, Melissa C. Mike Gip, MD, have reviewed the above documentation for accuracy and completeness, and I agree with the above.

## 2019-03-21 ENCOUNTER — Other Ambulatory Visit: Payer: Self-pay

## 2019-03-21 NOTE — Progress Notes (Signed)
Confirmed Name, DOB, and Address. Denies any concerns.  

## 2019-03-22 ENCOUNTER — Inpatient Hospital Stay: Payer: Medicare Other

## 2019-03-22 ENCOUNTER — Encounter: Payer: Self-pay | Admitting: Hematology and Oncology

## 2019-03-22 ENCOUNTER — Other Ambulatory Visit: Payer: Self-pay

## 2019-03-22 ENCOUNTER — Inpatient Hospital Stay: Payer: Medicare Other | Attending: Hematology and Oncology | Admitting: Hematology and Oncology

## 2019-03-22 ENCOUNTER — Other Ambulatory Visit: Payer: Self-pay | Admitting: Hematology and Oncology

## 2019-03-22 VITALS — BP 136/62 | HR 75 | Temp 97.5°F | Wt 147.5 lb

## 2019-03-22 DIAGNOSIS — Z801 Family history of malignant neoplasm of trachea, bronchus and lung: Secondary | ICD-10-CM | POA: Diagnosis not present

## 2019-03-22 DIAGNOSIS — R197 Diarrhea, unspecified: Secondary | ICD-10-CM | POA: Diagnosis not present

## 2019-03-22 DIAGNOSIS — Z9011 Acquired absence of right breast and nipple: Secondary | ICD-10-CM | POA: Diagnosis not present

## 2019-03-22 DIAGNOSIS — R17 Unspecified jaundice: Secondary | ICD-10-CM | POA: Diagnosis not present

## 2019-03-22 DIAGNOSIS — C50911 Malignant neoplasm of unspecified site of right female breast: Secondary | ICD-10-CM

## 2019-03-22 DIAGNOSIS — C50912 Malignant neoplasm of unspecified site of left female breast: Secondary | ICD-10-CM | POA: Insufficient documentation

## 2019-03-22 DIAGNOSIS — N6489 Other specified disorders of breast: Secondary | ICD-10-CM | POA: Insufficient documentation

## 2019-03-22 DIAGNOSIS — Z171 Estrogen receptor negative status [ER-]: Secondary | ICD-10-CM | POA: Insufficient documentation

## 2019-03-22 DIAGNOSIS — Z87891 Personal history of nicotine dependence: Secondary | ICD-10-CM | POA: Insufficient documentation

## 2019-03-22 DIAGNOSIS — Z95828 Presence of other vascular implants and grafts: Secondary | ICD-10-CM

## 2019-03-22 LAB — CBC WITH DIFFERENTIAL/PLATELET
Abs Immature Granulocytes: 0.01 10*3/uL (ref 0.00–0.07)
Basophils Absolute: 0 10*3/uL (ref 0.0–0.1)
Basophils Relative: 1 %
Eosinophils Absolute: 0.2 10*3/uL (ref 0.0–0.5)
Eosinophils Relative: 6 %
HCT: 39.3 % (ref 36.0–46.0)
Hemoglobin: 13.6 g/dL (ref 12.0–15.0)
Immature Granulocytes: 0 %
Lymphocytes Relative: 21 %
Lymphs Abs: 0.7 10*3/uL (ref 0.7–4.0)
MCH: 33.5 pg (ref 26.0–34.0)
MCHC: 34.6 g/dL (ref 30.0–36.0)
MCV: 96.8 fL (ref 80.0–100.0)
Monocytes Absolute: 0.3 10*3/uL (ref 0.1–1.0)
Monocytes Relative: 10 %
Neutro Abs: 2.1 10*3/uL (ref 1.7–7.7)
Neutrophils Relative %: 62 %
Platelets: 148 10*3/uL — ABNORMAL LOW (ref 150–400)
RBC: 4.06 MIL/uL (ref 3.87–5.11)
RDW: 11.8 % (ref 11.5–15.5)
WBC: 3.5 10*3/uL — ABNORMAL LOW (ref 4.0–10.5)
nRBC: 0 % (ref 0.0–0.2)

## 2019-03-22 LAB — COMPREHENSIVE METABOLIC PANEL
ALT: 33 U/L (ref 0–44)
AST: 29 U/L (ref 15–41)
Albumin: 4.2 g/dL (ref 3.5–5.0)
Alkaline Phosphatase: 49 U/L (ref 38–126)
Anion gap: 9 (ref 5–15)
BUN: 14 mg/dL (ref 8–23)
CO2: 25 mmol/L (ref 22–32)
Calcium: 9.3 mg/dL (ref 8.9–10.3)
Chloride: 99 mmol/L (ref 98–111)
Creatinine, Ser: 0.78 mg/dL (ref 0.44–1.00)
GFR calc Af Amer: >60 mL/min (ref >60)
GFR calc non Af Amer: >60 mL/min (ref >60)
Glucose, Bld: 185 mg/dL — ABNORMAL HIGH (ref 70–99)
Potassium: 3.6 mmol/L (ref 3.5–5.1)
Sodium: 133 mmol/L — ABNORMAL LOW (ref 135–145)
Total Bilirubin: 1.1 mg/dL (ref 0.3–1.2)
Total Protein: 6.8 g/dL (ref 6.5–8.1)

## 2019-03-22 LAB — BILIRUBIN, DIRECT: Bilirubin, Direct: 0.2 mg/dL (ref 0.0–0.2)

## 2019-03-22 MED ORDER — SODIUM CHLORIDE 0.9% FLUSH
10.0000 mL | INTRAVENOUS | Status: DC | PRN
  Administered 2019-03-22: 10 mL via INTRAVENOUS
  Filled 2019-03-22: qty 10

## 2019-03-22 MED ORDER — HEPARIN SOD (PORK) LOCK FLUSH 100 UNIT/ML IV SOLN
500.0000 [IU] | Freq: Once | INTRAVENOUS | Status: AC
  Administered 2019-03-22: 500 [IU] via INTRAVENOUS

## 2019-03-22 NOTE — Progress Notes (Signed)
No new changes noted today 

## 2019-03-23 ENCOUNTER — Telehealth: Payer: Self-pay

## 2019-03-23 LAB — CANCER ANTIGEN 27.29: CA 27.29: 14 U/mL (ref 0.0–38.6)

## 2019-03-23 NOTE — Telephone Encounter (Signed)
-----   Message from Lequita Asal, MD sent at 03/23/2019  6:27 AM EST ----- Regarding: Please call patient with CA27.29 results- normal  ----- Message ----- From: Interface, Lab In [place] Sent: 03/22/2019   9:47 AM EST To: Lequita Asal, MD

## 2019-03-23 NOTE — Telephone Encounter (Signed)
Spoke with the patient to inform her of her lab result. CA 27.29 was 14.0.  The patient was understanding

## 2019-04-02 ENCOUNTER — Emergency Department
Admission: EM | Admit: 2019-04-02 | Discharge: 2019-04-02 | Disposition: A | Discharge status: Home / Self Care | Payer: Medicare Other | Attending: Emergency Medicine | Admitting: Emergency Medicine

## 2019-04-02 ENCOUNTER — Encounter: Payer: Self-pay | Admitting: Emergency Medicine

## 2019-04-02 ENCOUNTER — Other Ambulatory Visit: Payer: Self-pay

## 2019-04-02 ENCOUNTER — Emergency Department: Payer: Medicare Other

## 2019-04-02 DIAGNOSIS — Y929 Unspecified place or not applicable: Secondary | ICD-10-CM | POA: Insufficient documentation

## 2019-04-02 DIAGNOSIS — S0990XA Unspecified injury of head, initial encounter: Secondary | ICD-10-CM | POA: Insufficient documentation

## 2019-04-02 DIAGNOSIS — E119 Type 2 diabetes mellitus without complications: Secondary | ICD-10-CM | POA: Diagnosis not present

## 2019-04-02 DIAGNOSIS — I10 Essential (primary) hypertension: Secondary | ICD-10-CM | POA: Diagnosis not present

## 2019-04-02 DIAGNOSIS — Z79899 Other long term (current) drug therapy: Secondary | ICD-10-CM | POA: Diagnosis not present

## 2019-04-02 DIAGNOSIS — Y9389 Activity, other specified: Secondary | ICD-10-CM | POA: Diagnosis not present

## 2019-04-02 DIAGNOSIS — E039 Hypothyroidism, unspecified: Secondary | ICD-10-CM | POA: Insufficient documentation

## 2019-04-02 DIAGNOSIS — Z7984 Long term (current) use of oral hypoglycemic drugs: Secondary | ICD-10-CM | POA: Diagnosis not present

## 2019-04-02 DIAGNOSIS — W0110XA Fall on same level from slipping, tripping and stumbling with subsequent striking against unspecified object, initial encounter: Secondary | ICD-10-CM | POA: Diagnosis not present

## 2019-04-02 DIAGNOSIS — W19XXXA Unspecified fall, initial encounter: Secondary | ICD-10-CM

## 2019-04-02 DIAGNOSIS — Z87891 Personal history of nicotine dependence: Secondary | ICD-10-CM | POA: Insufficient documentation

## 2019-04-02 DIAGNOSIS — Y999 Unspecified external cause status: Secondary | ICD-10-CM | POA: Insufficient documentation

## 2019-04-02 NOTE — ED Provider Notes (Signed)
[facility] Emergency Department Provider Note  ____________________________________________  Time seen: Approximately 7:23 PM  I have reviewed the triage vital signs and the nursing notes.   HISTORY  Chief Complaint Head Injury    HPI Stephanie Sanders is a 73 y.o. female who presents the emergency department for evaluation of head injury.  Patient reports that she was attempting to step into her vehicle, had placed the right foot inside the vehicle and was attempting to sit down when she lost her balance, falling over backwards and striking the back of her head.  No loss of conscious at the time of injury or subsequently.  Patient has a mild headache at this time.  No vision changes, no neck pain.  No chest pain, shortness of breath abdominal pain, nausea vomiting.  No medications prior to arrival.  Medical history as described below with no complications with medical history.         Past Medical History:  Diagnosis Date  . Cancer ([place]) 04/2017   right breast  . Diabetes mellitus without complication ([place])   . Diabetic nephropathy ([place])   . Headache    migraines  . Heart murmur    ASYMPTOMATIC  . Hypertension   . Hypothyroidism   . Personal history of chemotherapy 2019   right breast cancer  . Personal history of radiation therapy   . Pneumonia   . Seborrheic keratosis   . Vitamin D deficiency     Patient Active Problem List   Diagnosis Date Noted  . Elevated bilirubin 10/24/2018  . Seroma of breast 09/13/2018  . Diarrhea 07/19/2018  . Chemotherapy-induced nausea 11/11/2017  . 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 Hypomagnesemia 07/28/2017  . Goals of care, counseling/discussion 07/07/2017  . Chemotherapy-induced neutropenia ([place])   . Thrombocytopenia ([place])   . Sepsis ([place]) 06/28/2017  . Influenza A 06/28/2017  . Encounter for antineoplastic chemotherapy 06/18/2017  . Malignant neoplasm of right breast in female, estrogen  receptor negative ([place]) 06/04/2017    Past Surgical History:  Procedure Laterality Date  . APPENDECTOMY    . AXILLARY LYMPH NODE DISSECTION Right 02/01/2018   Procedure: AXILLARY LYMPH NODE DISSECTION;  Surgeon: Herbert Pun, MD;  Location: [facility] ORS;  Service: General;  Laterality: Right;  . BREAST BIOPSY Right 05/28/2017   right breast bx 3 areas invasive mamm ca lymph node mets  . BREAST CYST ASPIRATION Bilateral    neg  . BREAST LUMPECTOMY Right 02/01/2018  . CHOLECYSTECTOMY    . COLONOSCOPY WITH PROPOFOL N/A 01/01/2015   Procedure: COLONOSCOPY WITH PROPOFOL;  Surgeon: Josefine Class, MD;  Location: [facility] ENDOSCOPY;  Service: Endoscopy;  Laterality: N/A;  . EYE SURGERY Left    eyelid  . PARTIAL MASTECTOMY WITH NEEDLE LOCALIZATION Right 02/01/2018   Procedure: PARTIAL MASTECTOMY WITH NEEDLE LOCALIZATION;  Surgeon: Herbert Pun, MD;  Location: [facility] ORS;  Service: General;  Laterality: Right;  . PORTACATH PLACEMENT Right 06/10/2017   Procedure: INSERTION PORT-A-CATH;  Surgeon: Herbert Pun, MD;  Location: [facility] ORS;  Service: General;  Laterality: Right;  . SENTINEL NODE BIOPSY Right 02/01/2018   Procedure: SENTINEL NODE BIOPSY;  Surgeon: Herbert Pun, MD;  Location: [facility] ORS;  Service: General;  Laterality: Right;  . STENT PLACEMENT ILIAC ([place] HX)     temporary placement s/p cholecystectomy  . TUBAL LIGATION      Prior to Admission medications   Medication Sig Start Date End Date Taking? Authorizing Provider  acetaminophen (TYLENOL) 500 MG tablet Take 1,000 mg  by mouth every 6 (six) hours as needed for moderate pain or headache.     [provider]  atorvastatin (LIPITOR) 10 MG tablet Take 10 mg by mouth every evening.     [provider]  Cholecalciferol 2000 UNITS CAPS Take 4,000 Units by mouth daily.     [provider]  Lactobacillus (PROBIOTIC ACIDOPHILUS PO) Take 1 capsule by mouth daily.     [provider]  levothyroxine (SYNTHROID, LEVOTHROID) 50 MCG tablet Take 50 mcg by mouth daily before breakfast.    [provider]  MAGNESIUM GLYCINATE PLUS PO Take 1 tablet by mouth at bedtime.     [provider]  metFORMIN (GLUCOPHAGE) 1000 MG tablet Take 1,000 mg by mouth 2 (two) times daily with a meal.    [provider]  Multiple Vitamin (MULTIVITAMIN) tablet Take 1 tablet by mouth daily.    [provider]  quinapril (ACCUPRIL) 5 MG tablet Take 5 mg by mouth every morning.     [provider]  sodium bicarbonate 650 MG tablet Take 1,300 mg by mouth at bedtime.    [provider]    Allergies Fiorinal [butalbital-aspirin-caffeine], Sulfa antibiotics, Tramadol, and Other  Family History  Problem Relation Age of Onset  . Parkinson's disease Mother   . Cancer Maternal Aunt   . Cancer Father   . Lung cancer Father   . Breast cancer Neg Hx     Social History Social History   Tobacco Use  . Smoking status: Former Smoker    Packs/day: 2.00    Years: 10.00    Pack years: 20.00    Types: Cigarettes    Quit date: 04/21/1972    Years since quitting: 46.9  . Smokeless tobacco: Never Used  Substance Use Topics  . Alcohol use: No  . Drug use: No     Review of Systems  Constitutional: No fever/chills.  Head injury Eyes: No visual changes. No discharge ENT: No upper respiratory complaints. Cardiovascular: no chest pain. Respiratory: no cough. No SOB. Gastrointestinal: No abdominal pain.  No nausea, no vomiting.  No diarrhea.  No constipation. Musculoskeletal: Negative for musculoskeletal pain. Skin: Negative for rash, abrasions, lacerations, ecchymosis. Neurological: Mild headache but denies focal weakness or numbness. 10-point ROS otherwise negative.  ____________________________________________   PHYSICAL EXAM:  VITAL SIGNS: ED Triage Vitals  Enc Vitals Group     BP 04/02/19 1716 (!) 152/63     Pulse Rate  04/02/19 1716 84     Resp 04/02/19 1716 20     Temp 04/02/19 1716 98.6 F (37 C)     Temp Source 04/02/19 1716 Oral     SpO2 04/02/19 1716 98 %     Weight 04/02/19 1720 145 lb (65.8 kg)     Height 04/02/19 1720 5\' 8"  (1.727 m)     Head Circumference --      Peak Flow --      Pain Score 04/02/19 1720 4     Pain Loc --      Pain Edu? --      Excl. in [place]? --      Constitutional: Alert and oriented. Well appearing and in no acute distress. Eyes: Conjunctivae are normal. PERRL. EOMI. Head: Superficial abrasion noted to the posterior left occipital skull.  No active bleeding.  No foreign body.  Mild surrounding edema.  Area is mildly tender to palpation with no palpable underlying abnormality.  No tenderness to palpation of the osseous structures  of the skull and face.  No battle signs, raccoon eyes, serosanguineous fluid drainage from the ears or nares. ENT:      Ears:       Nose: No congestion/rhinnorhea.      Mouth/Throat: Mucous membranes are moist.  Neck: No stridor.  No cervical spine tenderness to palpation.  Cardiovascular: Normal rate, regular rhythm. Normal S1 and S2.  Good peripheral circulation. Respiratory: Normal respiratory effort without tachypnea or retractions. Lungs CTAB. Good air entry to the bases with no decreased or absent breath sounds. Musculoskeletal: Full range of motion to all extremities. No gross deformities appreciated.  Cranial nerves II through XII grossly intact.  Negative Romberg's and pronator drift. Neurologic:  Normal speech and language. No gross focal neurologic deficits are appreciated.  Skin:  Skin is warm, dry and intact. No rash noted. Psychiatric: Mood and affect are normal. Speech and behavior are normal. Patient exhibits appropriate insight and judgement.   ____________________________________________   LABS (all labs ordered are listed, but only abnormal results are displayed)  Labs Reviewed - No data to  display ____________________________________________  EKG   ____________________________________________  RADIOLOGY I personally viewed and evaluated these images as part of my medical decision making, as well as reviewing the written report by the radiologist.  CT Head Wo Contrast  Result Date: 04/02/2019 CLINICAL DATA:  Headache after falling and striking her head while trying to get in a car. Occipital scalp laceration. EXAM: CT HEAD WITHOUT CONTRAST TECHNIQUE: Contiguous axial images were obtained from the base of the skull through the vertex without intravenous contrast. COMPARISON:  CT scan dated 07/16/2012 FINDINGS: Brain: No evidence of acute infarction, hemorrhage, hydrocephalus, extra-axial collection or mass lesion/mass effect. Vascular: No hyperdense vessel or unexpected calcification. Skull: Normal. Negative for fracture or focal lesion. Sinuses/Orbits: Normal. Other: None IMPRESSION: Normal exam. Electronically Signed   By: Lorriane Shire M.D.   On: 04/02/2019 18:27   CT Cervical Spine Wo Contrast  Result Date: 04/02/2019 CLINICAL DATA:  Neck pain after fall EXAM: CT CERVICAL SPINE WITHOUT CONTRAST TECHNIQUE: Multidetector CT imaging of the cervical spine was performed without intravenous contrast. Multiplanar CT image reconstructions were also generated. COMPARISON:  None. FINDINGS: Alignment: Normal. Skull base and vertebrae: No acute fracture. No primary bone lesion or focal pathologic process. Soft tissues and spinal canal: No prevertebral fluid or swelling. No visible canal hematoma. Disc levels: Mild multilevel degenerative changes of the cervical spine most pronounced at C4-5 and C5-6. No evidence of high-grade foraminal or canal stenosis by CT. Upper chest: Negative. Other: None. IMPRESSION: 1. No acute fracture or dislocation of the cervical spine. 2. Mild multilevel degenerative changes of the cervical spine. Electronically Signed   By: Davina Poke M.D.   On:  04/02/2019 18:30    ____________________________________________    PROCEDURES  Procedure(s) performed:    Procedures    Medications - No data to display   ____________________________________________   INITIAL IMPRESSION / ASSESSMENT AND PLAN / ED COURSE  Pertinent labs & imaging results that were available during my care of the patient were reviewed by me and considered in my medical decision making (see chart for details).  Review of the [place] CSRS was performed in accordance of the [place] prior to dispensing any controlled drugs.           Patient's diagnosis is consistent with fall, minor head injury.  Patient presented to emergency department after a fall while attempting to get into her vehicle.  Patient did hit her  head but did not sustain loss of consciousness.  Overall exam was reassuring with patient being neurologically intact.  Imaging reveals no acute traumatic injuries.  Patient will take Tylenol, Motrin and caffeine at home if she develops a headache.  Follow-up primary care as needed. Patient is given ED precautions to return to the ED for any worsening or new symptoms.     ____________________________________________  FINAL CLINICAL IMPRESSION(S) / ED DIAGNOSES  Final diagnoses:  Minor head injury, initial encounter  Fall, initial encounter      NEW MEDICATIONS STARTED DURING THIS VISIT:  ED Discharge Orders    None          This chart was dictated using voice recognition software/Dragon. Despite best efforts to proofread, errors can occur which can change the meaning. Any change was purely unintentional.    Darletta Moll, PA-C 04/02/19 1925    Nance Pear, MD 04/02/19 2032

## 2019-04-02 NOTE — ED Triage Notes (Signed)
States was getting in car and lost balance, fell back and hit head on asphalt. Abrasion noted occipital scalp. Denies LOC with fall. Denies dizziness or weakness before fall. Denies use of blood thinners.

## 2019-04-02 NOTE — ED Notes (Signed)
Pt ambulatory to toilet with a steady gait. NAD notes a this time.

## 2019-04-02 NOTE — ED Notes (Signed)
Patient transported to CT 

## 2019-04-02 NOTE — ED Notes (Signed)
Pt ambulatory to toilet w/o distress and steady gait.

## 2019-04-02 NOTE — ED Notes (Signed)
First Nurse Note: [place] [facility] from [place] for [place]. Pt was trying to get into car and fell. Pt denies LOC.

## 2019-04-11 NOTE — Progress Notes (Signed)
[facility]  [address], [address] [place], [place] [address] Phone: [phone]  Fax: [phone]   Telephone Visit:  04/13/2019  Referring physician: Baxter Hire, MD  I connected with Stephanie Sanders on 04/13/2019 at 3:59 PM by telephone conferencing and verified that I was speaking with the correct person using 2 identifiers.  The patient was at home.  I discussed the limitations, risk, security and privacy concerns of performing an evaluation and management service by telephone conferencing and the availability of in person appointments.  I also discussed with the patient that there may be a patient responsible charge related to this service.  The patient expressed understanding and agreed to proceed.   Chief Complaint: Stephanie Sanders is a 73 y.o. 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  HPI: The patient was last seen in the medical oncology clinic on 03/22/2019. At that time, she felt good.  She had significant scarring in the right breast.  Her seroma had not drained since last visit. CBC included a hematocrit 39.3, hemoglobin 13.6, platelets 148,000, WBC 3,500. Sodium was 133. CA27.29 was 14.0 (normal). We discussed a bilateral breast MRI and plans for surveillance.   Bilateral breast MRI on 04/12/2019 showed no evidence of malignancy in either breast. The right breast breast showed posttreatment changes. Next bilateral mammogram planned for 01/2020.   During the interim, she has done well.  Her seroma is unchanged. She denies any drainage. Her seroma remains tender, and feels tight with movement. Her bowels are normal. The tenderness in her feet have resolved. She still has her port, but would like to have it removed.   She will see Dr. Massie Maroon in 04/2019.   Past Medical History:  Diagnosis Date  . Cancer ([place]) 04/2017   right breast  . Diabetes  mellitus without complication ([place])   . Diabetic nephropathy ([place])   . Headache    migraines  . Heart murmur    ASYMPTOMATIC  . Hypertension   . Hypothyroidism   . Personal history of chemotherapy 2019   right breast cancer  . Personal history of radiation therapy   . Pneumonia   . Seborrheic keratosis   . Vitamin D deficiency     Past Surgical History:  Procedure Laterality Date  . APPENDECTOMY    . AXILLARY LYMPH NODE DISSECTION Right 02/01/2018   Procedure: AXILLARY LYMPH NODE DISSECTION;  Surgeon: Herbert Pun, MD;  Location: [facility] ORS;  Service: General;  Laterality: Right;  . BREAST BIOPSY Right 05/28/2017   right breast bx 3 areas invasive mamm ca lymph node mets  . BREAST CYST ASPIRATION Bilateral    neg  . BREAST LUMPECTOMY Right 02/01/2018  . CHOLECYSTECTOMY    . COLONOSCOPY WITH PROPOFOL N/A 01/01/2015   Procedure: COLONOSCOPY WITH PROPOFOL;  Surgeon: Josefine Class, MD;  Location: [facility] ENDOSCOPY;  Service: Endoscopy;  Laterality: N/A;  . EYE SURGERY Left    eyelid  . PARTIAL MASTECTOMY WITH NEEDLE LOCALIZATION Right 02/01/2018   Procedure: PARTIAL MASTECTOMY WITH NEEDLE LOCALIZATION;  Surgeon: Herbert Pun, MD;  Location: [facility] ORS;  Service: General;  Laterality: Right;  . PORTACATH PLACEMENT Right 06/10/2017   Procedure: INSERTION PORT-A-CATH;  Surgeon: Herbert Pun, MD;  Location: [facility] ORS;  Service: General;  Laterality: Right;  . SENTINEL NODE BIOPSY Right 02/01/2018   Procedure: SENTINEL NODE BIOPSY;  Surgeon: Herbert Pun, MD;  Location: [facility] ORS;  Service: General;  Laterality: Right;  . STENT PLACEMENT ILIAC ([place] HX)     temporary placement s/p cholecystectomy  . TUBAL LIGATION      Family History  Problem Relation Age of Onset  . Parkinson's disease Mother   . Cancer Maternal Aunt   . Cancer Father   . Lung cancer Father   . Breast cancer Neg Hx     Social History:  reports that she quit smoking about 47  years ago. Her smoking use included cigarettes. She has a 20.00 pack-year smoking history. She has never used smokeless tobacco. She reports that she does not drink alcohol or use drugs. has 3 children (daughters: age 31 and 41; son Jaquelyn Bitter): age 69). She is retired. She worked for the [employer]. Her husband has dementia. She lives in [place]. The patient is alone today.  Participants in the patient's visit and their role in the encounter included the patient and Vito Berger, CMA, today.  The intake visit was provided by Vito Berger, CMA.   Allergies:  Allergies  Allergen Reactions  . Fiorinal [Butalbital-Aspirin-Caffeine] Nausea And Vomiting and Other (See Comments)    SEVERE HEADACHE  . Sulfa Antibiotics Other (See Comments)    Burning esophagus and stomach  . Tramadol Nausea And Vomiting  . Other Rash    STERI-STRIPS-RASH    Current Medications: Current Outpatient Medications  Medication Sig Dispense Refill  . acetaminophen (TYLENOL) 500 MG tablet Take 1,000 mg by mouth every 6 (six) hours as needed for moderate pain or headache.     Marland Kitchen atorvastatin (LIPITOR) 10 MG tablet Take 10 mg by mouth every evening.     . Cholecalciferol 2000 UNITS CAPS Take 4,000 Units by mouth daily.     . Lactobacillus (PROBIOTIC ACIDOPHILUS PO) Take 1 capsule by mouth daily.     Marland Kitchen levothyroxine (SYNTHROID, LEVOTHROID) 50 MCG tablet Take 50 mcg by mouth daily before breakfast.    . MAGNESIUM GLYCINATE PLUS PO Take 1 tablet by mouth at bedtime.     . metFORMIN (GLUCOPHAGE) 1000 MG tablet Take 1,000 mg by mouth 2 (two) times daily with a meal.    . Multiple Vitamin (MULTIVITAMIN) tablet Take 1 tablet by mouth daily.    . quinapril (ACCUPRIL) 5 MG tablet Take 5 mg by mouth every morning.     . sodium bicarbonate 650 MG tablet Take 1,300 mg by mouth at bedtime.     No current facility-administered medications for this visit.   Facility-Administered Medications Ordered in Other Visits    Medication Dose Route Frequency Provider Last Rate Last Admin  . sodium chloride flush (NS) 0.9 % injection 10 mL  10 mL Intravenous PRN Lequita Asal, MD   10 mL at 10/21/17 0841    Review of Systems  Constitutional: Negative.  Negative for chills, fever, malaise/fatigue and weight loss.       Feels "good".  HENT: Negative.  Negative for congestion, ear discharge, ear pain, hearing loss, nosebleeds, sinus pain and sore throat.   Eyes: Negative.  Negative for blurred vision and double vision.  Respiratory: Negative.  Negative for cough, sputum production and shortness of breath.   Cardiovascular: Positive for chest pain (tender breast). Negative for palpitations, orthopnea and leg swelling.  Gastrointestinal: Negative for abdominal pain, blood in stool, constipation, diarrhea, melena, nausea and vomiting.       Bowels are normal.  Genitourinary: Negative.  Negative for dysuria, frequency, hematuria and urgency.  Musculoskeletal: Negative.  Negative for back pain,  falls and myalgias.  Skin: Negative.  Negative for itching and rash.  Neurological: Negative for dizziness, tingling, tremors, sensory change, speech change, focal weakness, weakness and headaches.  Endo/Heme/Allergies: Negative.  Does not bruise/bleed easily.  Psychiatric/Behavioral: Negative.  Negative for depression and memory loss. The patient is not nervous/anxious and does not have insomnia.   All other systems reviewed and are negative.  Performance status (ECOG): 0   No visits with results within 3 Day(s) from this visit.  Latest known visit with results is:  Infusion on 03/22/2019  Component Date Value Ref Range Status  . Bilirubin, Direct 03/22/2019 0.2  0.0 - 0.2 mg/dL Final   Performed at [facility], [address] [place], [place] [address]  . Sodium 03/22/2019 133* 135 - 145 mmol/L Final  . Potassium 03/22/2019 3.6  3.5 - 5.1 mmol/L Final  . Chloride 03/22/2019 99  98 - 111 mmol/L Final   . CO2 03/22/2019 25  22 - 32 mmol/L Final  . Glucose, Bld 03/22/2019 185* 70 - 99 mg/dL Final  . BUN 03/22/2019 14  8 - 23 mg/dL Final  . Creatinine, Ser 03/22/2019 0.78  0.44 - 1.00 mg/dL Final  . Calcium 03/22/2019 9.3  8.9 - 10.3 mg/dL Final  . Total Protein 03/22/2019 6.8  6.5 - 8.1 g/dL Final  . Albumin 03/22/2019 4.2  3.5 - 5.0 g/dL Final  . AST 03/22/2019 29  15 - 41 U/L Final  . ALT 03/22/2019 33  0 - 44 U/L Final  . Alkaline Phosphatase 03/22/2019 49  38 - 126 U/L Final  . Total Bilirubin 03/22/2019 1.1  0.3 - 1.2 mg/dL Final  . GFR calc non Af Amer 03/22/2019 >60  >60 mL/min Final  . GFR calc Af Amer 03/22/2019 >60  >60 mL/min Final  . Anion gap 03/22/2019 9  5 - 15 Final   Performed at [facility], [address], [place], [place] [address]  . WBC 03/22/2019 3.5* 4.0 - 10.5 K/uL Final  . RBC 03/22/2019 4.06  3.87 - 5.11 MIL/uL Final  . Hemoglobin 03/22/2019 13.6  12.0 - 15.0 g/dL Final  . HCT 03/22/2019 39.3  36.0 - 46.0 % Final  . MCV 03/22/2019 96.8  80.0 - 100.0 fL Final  . MCH 03/22/2019 33.5  26.0 - 34.0 pg Final  . MCHC 03/22/2019 34.6  30.0 - 36.0 g/dL Final  . RDW 03/22/2019 11.8  11.5 - 15.5 % Final  . Platelets 03/22/2019 148* 150 - 400 K/uL Final  . nRBC 03/22/2019 0.0  0.0 - 0.2 % Final  . Neutrophils Relative % 03/22/2019 62  % Final  . Neutro Abs 03/22/2019 2.1  1.7 - 7.7 K/uL Final  . Lymphocytes Relative 03/22/2019 21  % Final  . Lymphs Abs 03/22/2019 0.7  0.7 - 4.0 K/uL Final  . Monocytes Relative 03/22/2019 10  % Final  . Monocytes Absolute 03/22/2019 0.3  0.1 - 1.0 K/uL Final  . Eosinophils Relative 03/22/2019 6  % Final  . Eosinophils Absolute 03/22/2019 0.2  0.0 - 0.5 K/uL Final  . Basophils Relative 03/22/2019 1  % Final  . Basophils Absolute 03/22/2019 0.0  0.0 - 0.1 K/uL Final  . Immature Granulocytes 03/22/2019 0  % Final  . Abs Immature Granulocytes 03/22/2019 0.01  0.00 - 0.07 K/uL Final   Performed at [facility], [address], [place],  [address]  . CA 27.29 03/22/2019 14.0  0.0 - 38.6 U/mL Final  Comment: (NOTE) Siemens Centaur Immunochemiluminometric Methodology [facility]) Values obtained with different assay methods or kits cannot be used interchangeably. Results cannot be interpreted as absolute evidence of the presence or absence of malignant disease. Performed At: [facility] [place] [place], [place] [id number] Rush Farmer MD [id number]     Assessment:  Stephanie Sanders is a 73 y.o. female with multi-focal triple negative clinical stage T1cN1Mx right breast cancers/p biopsy on 05/28/2017. Pathologyrevealed two grade II invasive mammary carcinomas of no special type (4 cm from the nipple and 5 cm from the nipple). Tumors were in close proximity. Lymph node biopsy confirmed metastatic carcinoma of breast origin. Tumor is ER negative (<1%), PR negative and Her 2/neu 2+ by IHC. [facility] genetic testingon 07/21/2017 revealed a variant of uncertain significance in RECQL4.  Right sided mammogram and ultrasound on 05/21/2017 revealed 2 highly suspicious masses in the right breast at the 10:30 position 4 cm from nipple (1.8 x 1.2 x 1.1 cm) and at the 10:30 position 5 cm from nipple (1.1 x 0.9 x 0.9 cm). There was a suspicious 2.7 x 1 x 2.2 cm lymph node in the right axilla with asymmetric nodular cortical thickening.  Ultrasound guided biopsyof the 2 masses and lymph node on 05/28/2017 revealed grade II invasive mammary carcinoma of no special type (4 cm from the nipple and 5 cm from the nipple). Lymph node biopsy revealed metastatic carcinoma of breast origin. Tumor is ER negative (<1%), PR negative and Her 2/neu 2+ by IHC. FISH was negative.   CA 27.29has been followed: 16.5 on 06/04/2017, 14.3 on 05/21/2018, 20.3 on 06/18/2018, 27.5 on 11/16/2018, and 14.0 on 03/22/2019. CA15-3was 17.6 on 06/04/2017.  Echoon 06/09/2017 revealed an EF of 60-65%.  She has enrolled on the UPBEAT clinical trial.  She received 4 cycles of AC(06/23/2017 - 09/08/2017) with Margarette Canada support. Cycle #1 was complicated by influenza A. Nadir counts included an ANC of 0 and platelet count of 71,000. Cycle #2 postponed due to slow recovery from influenza and excisional cyst removal with surrounding cellulitis (treated with 10 days of doxycycline).   She received 12 weeks of Taxol (09/29/2017 - 01/05/2018). She received GCSFafter week #3 and 5 Taxol secondary to borderline counts. Cycle #5 was postponed on 10/29/2017 due to patient's wishes to be treated on Tuesdays only. Week #7 Taxol was held secondary to neutropenia (ANC 200).  Right mammogram and ultrasoundon 09/28/2017 revealed the 2 adjacent masses in the right breast at the 10:30 o'clock position had both decreased in size (1.8 x 1.1 x 1.2 cm to 1.2 x 0.7 x 1.1 cm; 11 x 9 x 9 mm to 7 x 6 x 6 mm) as well as the metastatic right axillary lymph node (2.7 cm to 1.6 x 0.9 x 1.7 cm).   Right partial mastectomyand sentinel lymph node biopsy on 02/01/2018 Pathology revealed 10 mm grade II invasive carcinoma of no special type. The tumor bed encompassed the biopsy sites, with foci of residual carcinoma at both biopsy sites and also involving the tissue between the biopsy sites. Foci of residual carcinoma range from 1 to 10 mm.There was no DCIS. Distance from closest margin was 1 mm. Four of 4 lymph nodes were negative. Pathologic stage was ypT1b (m) ypN0 (sn).  She received breast radiationfrom 03/10/2018 - 05/07/2018.  She received 8 cycles ofXeloda(07/06/2018 - 12/29/2018).  Bilateral mammogram and right unilateral ultrasound on 01/27/2019 revealed no findings to explain the palpable areas of concern. There was no evidence of malignancy in  either breast.  Bilateral breast MRI on 04/12/2019 showed no evidence of malignancy in either breast. The right breast breast showed posttreatment changes.   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 Normal labs on 11/24/2017 included: B12, folate, TSH, and copper.  Bone densityon 07/01/2018 was normal witha T score of -0.8 and the RIGHTfemoral neck.  [facility] with with fever, cough and generalized weakness. Nasal swab was + forinfluenza A. She completed a course of Tamiflu.  Symptomatically, she feels "good".  She notes some tenderness associated with her seroma, but no drainage.  Plan: 1.   Labs today: CBC with diff, CMP, CA 27.29. 2.Multifocal triple negative RIGHT breast cancer She is s/p neoadjuvant chemotherapy.             4 cycles of AC and 12 weeks of Taxol (last 01/05/2018). She is s/p right partial mastectomy and SLN biopsy (02/01/2018). Sheis s/p 8 cycles of Xeloda (last 12/29/2018).             Bilateral mammogram and right breast ultrasound on 01/27/2019 revealed no evidence of malignancy.   Bilateral breast MRI on 04/12/2019 showed no evidence of malignancy in either breast.   Anticipate next bilateral mammogram in 01/2020.  Continue surveillance. 3. Diarrhea, resolved  Bowels normal s/p completion of Xeloda.  4.Right breast seroma No further drainage.  Site remains tender. 5.Port-a-cath             Readdress removal of port-a-cath.             Schedule port removal with Dr Windell Moment. 6.RTC in 4 months for MD assessment and labs (CBC with diff, CMP, CA27.29).  I discussed the assessment and treatment plan with the patient.  The patient was provided an opportunity to ask questions and all were answered.  The patient agreed with the plan and demonstrated an understanding of the instructions.  The patient was advised to call back if the symptoms worsen or if the condition fails to improve as anticipated.  I provided 7 minutes (3:59 PM - 4:06 PM) of non face-to-face video visit time during this this encounter and > 50% was  spent counseling as documented under my assessment and plan.  I provided these services from the [facility] office.   Lequita Asal, MD, PhD    04/13/2019, 3:59 PM  I, Selena Batten, am acting as scribe for [employer]. Mike Gip, MD, PhD.  I, Laurabeth Yip C. Mike Gip, MD, have reviewed the above documentation for accuracy and completeness, and I agree with the above.

## 2019-04-12 ENCOUNTER — Ambulatory Visit
Admission: RE | Admit: 2019-04-12 | Discharge: 2019-04-12 | Disposition: A | Discharge status: Home / Self Care | Payer: Medicare Other | Source: Ambulatory Visit | Attending: Hematology and Oncology | Admitting: Hematology and Oncology

## 2019-04-12 ENCOUNTER — Encounter: Payer: Self-pay | Admitting: Hematology and Oncology

## 2019-04-12 ENCOUNTER — Other Ambulatory Visit: Payer: Self-pay

## 2019-04-12 DIAGNOSIS — Z171 Estrogen receptor negative status [ER-]: Secondary | ICD-10-CM | POA: Insufficient documentation

## 2019-04-12 DIAGNOSIS — C50911 Malignant neoplasm of unspecified site of right female breast: Secondary | ICD-10-CM | POA: Insufficient documentation

## 2019-04-12 MED ORDER — GADOBUTROL 1 MMOL/ML IV SOLN
6.0000 mL | Freq: Once | INTRAVENOUS | Status: AC | PRN
  Administered 2019-04-12: 6 mL via INTRAVENOUS

## 2019-04-13 ENCOUNTER — Inpatient Hospital Stay (HOSPITAL_BASED_OUTPATIENT_CLINIC_OR_DEPARTMENT_OTHER): Payer: Medicare Other | Admitting: Hematology and Oncology

## 2019-04-13 DIAGNOSIS — C50911 Malignant neoplasm of unspecified site of right female breast: Secondary | ICD-10-CM | POA: Diagnosis not present

## 2019-04-13 DIAGNOSIS — Z171 Estrogen receptor negative status [ER-]: Secondary | ICD-10-CM | POA: Diagnosis not present

## 2019-04-13 DIAGNOSIS — N6489 Other specified disorders of breast: Secondary | ICD-10-CM | POA: Diagnosis not present

## 2019-05-18 ENCOUNTER — Encounter: Payer: Self-pay | Admitting: Radiation Oncology

## 2019-05-18 ENCOUNTER — Ambulatory Visit
Admission: RE | Admit: 2019-05-18 | Discharge: 2019-05-18 | Disposition: A | Discharge status: Home / Self Care | Payer: Medicare Other | Source: Ambulatory Visit | Attending: Radiation Oncology | Admitting: Radiation Oncology

## 2019-05-18 ENCOUNTER — Other Ambulatory Visit: Payer: Self-pay

## 2019-05-18 VITALS — BP 144/78 | HR 87 | Temp 97.7°F | Resp 18 | Wt 148.3 lb

## 2019-05-18 DIAGNOSIS — Z853 Personal history of malignant neoplasm of breast: Secondary | ICD-10-CM | POA: Insufficient documentation

## 2019-05-18 DIAGNOSIS — Z923 Personal history of irradiation: Secondary | ICD-10-CM | POA: Insufficient documentation

## 2019-05-18 DIAGNOSIS — C50911 Malignant neoplasm of unspecified site of right female breast: Secondary | ICD-10-CM

## 2019-05-18 NOTE — Progress Notes (Signed)
Radiation Oncology Follow up Note  Name: Stephanie Sanders   Date:   05/18/2019 MRN:  [id number] DOB: [birth year]    This 74 y.o. female presents to the clinic today for 1 year follow-up status post whole breast radiation to her right breast for stage IIb triple negative invasive mammary carcinoma.  REFERRING PROVIDER: Baxter Hire, MD  HPI: Patient is a 74 year old female now at 1 year having completed whole breast radiation to her right breast for stage IIb triple negative invasive mammary carcinoma.  She is seen today in routine follow-up and is doing well still has some tenderness in the right breast and had retraction of her scar towards the incision site creating a fair cosmetic result.  She had a MRI of her breast back in December which I have reviewed shows no evidence of malignancy in either breast.  She is not on antiestrogen therapy based on the triple negative nature of her disease.  COMPLICATIONS OF TREATMENT: none  FOLLOW UP COMPLIANCE: keeps appointments   PHYSICAL EXAM:  BP (!) 144/78   Pulse 87   Temp 97.7 F (36.5 C)   Resp 18   Wt 148 lb 4.8 oz (67.3 kg)   BMI 22.55 kg/m  Right breast is retracted towards the scar with some indentation of the skin secondary to her surgery.  No dominant mass or nodularity is noted in either breast in 2 positions examined.  No axillary or supraclavicular adenopathy is appreciated.  Well-developed well-nourished patient in NAD. HEENT reveals PERLA, EOMI, discs not visualized.  Oral cavity is clear. No oral mucosal lesions are identified. Neck is clear without evidence of cervical or supraclavicular adenopathy. Lungs are clear to A&P. Cardiac examination is essentially unremarkable with regular rate and rhythm without murmur rub or thrill. Abdomen is benign with no organomegaly or masses noted. Motor sensory and DTR levels are equal and symmetric in the upper and lower extremities. Cranial nerves II through XII are grossly intact.  Proprioception is intact. No peripheral adenopathy or edema is identified. No motor or sensory levels are noted. Crude visual fields are within normal range.  RADIOLOGY RESULTS: MRI scan is reviewed and compatible with above-stated findings  PLAN: Present time patient continues to do well 1 year out from whole breast radiation therapy with no evidence of disease.  I am pleased with her overall progress.  I have asked to see her back in 1 year for follow-up.  Patient knows to call with any concerns.  I would like to take this opportunity to thank you for allowing me to participate in the care of your patient.Noreene Filbert, MD

## 2019-05-30 DIAGNOSIS — R251 Tremor, unspecified: Secondary | ICD-10-CM | POA: Insufficient documentation

## 2019-06-28 ENCOUNTER — Encounter: Payer: Self-pay | Admitting: Ophthalmology

## 2019-06-28 ENCOUNTER — Other Ambulatory Visit: Payer: Self-pay

## 2019-07-01 ENCOUNTER — Other Ambulatory Visit
Admission: RE | Admit: 2019-07-01 | Discharge: 2019-07-01 | Disposition: A | Discharge status: Home / Self Care | Payer: Medicare Other | Source: Ambulatory Visit | Attending: Ophthalmology | Admitting: Ophthalmology

## 2019-07-01 DIAGNOSIS — Z20822 Contact with and (suspected) exposure to covid-19: Secondary | ICD-10-CM | POA: Insufficient documentation

## 2019-07-01 DIAGNOSIS — Z01812 Encounter for preprocedural laboratory examination: Secondary | ICD-10-CM | POA: Diagnosis present

## 2019-07-01 LAB — SARS CORONAVIRUS 2 (TAT 6-24 HRS): SARS Coronavirus 2: NEGATIVE

## 2019-07-01 NOTE — Discharge Instructions (Signed)

## 2019-07-05 ENCOUNTER — Ambulatory Visit
Admission: RE | Admit: 2019-07-05 | Discharge: 2019-07-05 | Disposition: A | Discharge status: Home / Self Care | Payer: Medicare Other | Attending: Ophthalmology | Admitting: Ophthalmology

## 2019-07-05 ENCOUNTER — Encounter
Admission: RE | Disposition: A | Discharge status: Home / Self Care | Payer: Self-pay | Source: Home / Self Care | Attending: Ophthalmology

## 2019-07-05 ENCOUNTER — Other Ambulatory Visit: Payer: Self-pay

## 2019-07-05 ENCOUNTER — Encounter: Payer: Self-pay | Admitting: Ophthalmology

## 2019-07-05 ENCOUNTER — Ambulatory Visit: Payer: Medicare Other | Admitting: Anesthesiology

## 2019-07-05 DIAGNOSIS — E559 Vitamin D deficiency, unspecified: Secondary | ICD-10-CM | POA: Diagnosis not present

## 2019-07-05 DIAGNOSIS — M1990 Unspecified osteoarthritis, unspecified site: Secondary | ICD-10-CM | POA: Insufficient documentation

## 2019-07-05 DIAGNOSIS — E039 Hypothyroidism, unspecified: Secondary | ICD-10-CM | POA: Insufficient documentation

## 2019-07-05 DIAGNOSIS — E1142 Type 2 diabetes mellitus with diabetic polyneuropathy: Secondary | ICD-10-CM | POA: Diagnosis not present

## 2019-07-05 DIAGNOSIS — Z87891 Personal history of nicotine dependence: Secondary | ICD-10-CM | POA: Diagnosis not present

## 2019-07-05 DIAGNOSIS — E7800 Pure hypercholesterolemia, unspecified: Secondary | ICD-10-CM | POA: Insufficient documentation

## 2019-07-05 DIAGNOSIS — Z888 Allergy status to other drugs, medicaments and biological substances status: Secondary | ICD-10-CM | POA: Diagnosis not present

## 2019-07-05 DIAGNOSIS — R519 Headache, unspecified: Secondary | ICD-10-CM | POA: Diagnosis not present

## 2019-07-05 DIAGNOSIS — Z853 Personal history of malignant neoplasm of breast: Secondary | ICD-10-CM | POA: Diagnosis not present

## 2019-07-05 DIAGNOSIS — I340 Nonrheumatic mitral (valve) insufficiency: Secondary | ICD-10-CM | POA: Insufficient documentation

## 2019-07-05 DIAGNOSIS — I10 Essential (primary) hypertension: Secondary | ICD-10-CM | POA: Insufficient documentation

## 2019-07-05 DIAGNOSIS — Z9049 Acquired absence of other specified parts of digestive tract: Secondary | ICD-10-CM | POA: Diagnosis not present

## 2019-07-05 DIAGNOSIS — E1136 Type 2 diabetes mellitus with diabetic cataract: Secondary | ICD-10-CM | POA: Insufficient documentation

## 2019-07-05 DIAGNOSIS — H2511 Age-related nuclear cataract, right eye: Secondary | ICD-10-CM | POA: Diagnosis present

## 2019-07-05 HISTORY — DX: Unspecified osteoarthritis, unspecified site: M19.90

## 2019-07-05 HISTORY — PX: CATARACT EXTRACTION W/PHACO: SHX586

## 2019-07-05 LAB — GLUCOSE, CAPILLARY
Glucose-Capillary: 138 mg/dL — ABNORMAL HIGH (ref 70–99)
Glucose-Capillary: 152 mg/dL — ABNORMAL HIGH (ref 70–99)

## 2019-07-05 SURGERY — PHACOEMULSIFICATION, CATARACT, WITH IOL INSERTION
Anesthesia: Monitor Anesthesia Care | Site: Eye | Laterality: Right

## 2019-07-05 MED ORDER — FENTANYL CITRATE (PF) 100 MCG/2ML IJ SOLN
INTRAMUSCULAR | Status: DC | PRN
  Administered 2019-07-05: 50 ug via INTRAVENOUS

## 2019-07-05 MED ORDER — BRIMONIDINE TARTRATE-TIMOLOL 0.2-0.5 % OP SOLN
OPHTHALMIC | Status: DC | PRN
  Administered 2019-07-05: 1 [drp] via OPHTHALMIC

## 2019-07-05 MED ORDER — ARMC OPHTHALMIC DILATING DROPS
1.0000 "application " | OPHTHALMIC | Status: DC | PRN
  Administered 2019-07-05 (×3): 1 via OPHTHALMIC

## 2019-07-05 MED ORDER — NA CHONDROIT SULF-NA HYALURON 40-17 MG/ML IO SOLN
INTRAOCULAR | Status: DC | PRN
  Administered 2019-07-05: 1 mL via INTRAOCULAR

## 2019-07-05 MED ORDER — MOXIFLOXACIN HCL 0.5 % OP SOLN
OPHTHALMIC | Status: DC | PRN
  Administered 2019-07-05: 0.2 mL via OPHTHALMIC

## 2019-07-05 MED ORDER — EPINEPHRINE PF 1 MG/ML IJ SOLN
INTRAOCULAR | Status: DC | PRN
  Administered 2019-07-05: 46 mL via OPHTHALMIC

## 2019-07-05 MED ORDER — LIDOCAINE HCL (PF) 2 % IJ SOLN
INTRAOCULAR | Status: DC | PRN
  Administered 2019-07-05: 2 mL

## 2019-07-05 MED ORDER — MIDAZOLAM HCL 2 MG/2ML IJ SOLN
INTRAMUSCULAR | Status: DC | PRN
  Administered 2019-07-05: .5 mg via INTRAVENOUS
  Administered 2019-07-05: 1.5 mg via INTRAVENOUS

## 2019-07-05 MED ORDER — TETRACAINE HCL 0.5 % OP SOLN
1.0000 [drp] | OPHTHALMIC | Status: DC | PRN
  Administered 2019-07-05 (×3): 1 [drp] via OPHTHALMIC

## 2019-07-05 SURGICAL SUPPLY — 21 items
CANNULA ANT/CHMB 27G (MISCELLANEOUS) ×2 IMPLANT
CANNULA ANT/CHMB 27GA (MISCELLANEOUS) ×4 IMPLANT
GLOVE SURG LX 8.0 MICRO (GLOVE) ×1
GLOVE SURG LX STRL 8.0 MICRO (GLOVE) ×1 IMPLANT
GLOVE SURG TRIUMPH 8.0 PF LTX (GLOVE) ×2 IMPLANT
GOWN STRL REUS W/ TWL LRG LVL3 (GOWN DISPOSABLE) ×2 IMPLANT
GOWN STRL REUS W/TWL LRG LVL3 (GOWN DISPOSABLE) ×2
LENS IOL DIOP 22.0 (Intraocular Lens) ×2 IMPLANT
LENS IOL TECNIS MONO 22.0 (Intraocular Lens) IMPLANT
MARKER SKIN DUAL TIP RULER LAB (MISCELLANEOUS) ×2 IMPLANT
NDL FILTER BLUNT 18X1 1/2 (NEEDLE) ×1 IMPLANT
NDL RETROBULBAR .5 NSTRL (NEEDLE) ×2 IMPLANT
NEEDLE FILTER BLUNT 18X 1/2SAF (NEEDLE) ×1
NEEDLE FILTER BLUNT 18X1 1/2 (NEEDLE) ×1 IMPLANT
PACK EYE AFTER SURG (MISCELLANEOUS) ×2 IMPLANT
PACK OPTHALMIC (MISCELLANEOUS) ×2 IMPLANT
PACK PORFILIO (MISCELLANEOUS) ×2 IMPLANT
SYR 3ML LL SCALE MARK (SYRINGE) ×2 IMPLANT
SYR TB 1ML LUER SLIP (SYRINGE) ×2 IMPLANT
WATER STERILE IRR 250ML POUR (IV SOLUTION) ×2 IMPLANT
WIPE NON LINTING 3.25X3.25 (MISCELLANEOUS) ×2 IMPLANT

## 2019-07-05 NOTE — Anesthesia Preprocedure Evaluation (Addendum)
Anesthesia Evaluation  Patient identified by MRN, date of birth, ID band Patient awake    Reviewed: Allergy & Precautions, NPO status , Patient's Chart, lab work & pertinent test results  Airway Mallampati: II  TM Distance: >3 FB Neck ROM: Full    Dental no notable dental hx.    Pulmonary former smoker,    Pulmonary exam normal        Cardiovascular hypertension, Normal cardiovascular exam  ECHO 05/2017 - Left ventricle: The cavity size was normal. Wall thickness was normal. Systolic function was normal. The estimated ejection fraction was in the range of 60% to 65%. Doppler parameters are consistent with abnormal left ventricular relaxation (grade 1 diastolic dysfunction).  - Mitral valve: There was mild regurgitation.  - Right ventricle: The cavity size was normal. Wall thickness wasnormal. Systolic function was normal.    Neuro/Psych  Headaches,  Neuromuscular disease (Peripheral neuropathy) negative psych ROS   GI/Hepatic negative GI ROS, Neg liver ROS,   Endo/Other  diabetes, Type 2Hypothyroidism   Renal/GU Renal disease  negative genitourinary   Musculoskeletal  (+) Arthritis , Osteoarthritis,    Abdominal Normal abdominal exam  (+)   Peds  Hematology negative hematology ROS (+)   Anesthesia Other Findings Breast cancer  Reproductive/Obstetrics                            Anesthesia Physical Anesthesia Plan  ASA: II  Anesthesia Plan: MAC   Post-op Pain Management:    Induction: Intravenous  PONV Risk Score and Plan: 2 and Ondansetron and Treatment may vary due to age or medical condition  Airway Management Planned: Nasal Cannula  Additional Equipment:   Intra-op Plan:   Post-operative Plan:   Informed Consent: I have reviewed the patients History and Physical, chart, labs and discussed the procedure including the risks, benefits and alternatives for the proposed anesthesia  with the patient or authorized representative who has indicated his/her understanding and acceptance.     Dental advisory given  Plan Discussed with: CRNA  Anesthesia Plan Comments:         Anesthesia Quick Evaluation

## 2019-07-05 NOTE — Op Note (Signed)
PREOPERATIVE DIAGNOSIS:  Nuclear sclerotic cataract of the right eye.   POSTOPERATIVE DIAGNOSIS:  H25.11 Cataract   OPERATIVE PROCEDURE:@   SURGEON:  Birder Robson, MD.   ANESTHESIA:  Anesthesiologist: Ardeth Sportsman, MD CRNA: Mayme Genta, CRNA  1.      Managed anesthesia care. 2.      0.16ml of Shugarcaine was instilled in the eye following the paracentesis.   COMPLICATIONS:  None.   TECHNIQUE:   Stop and chop   DESCRIPTION OF PROCEDURE:  The patient was examined and consented in the preoperative holding area where the aforementioned topical anesthesia was applied to the right eye and then brought back to the Operating Room where the right eye was prepped and draped in the usual sterile ophthalmic fashion and a lid speculum was placed. A paracentesis was created with the side port blade and the anterior chamber was filled with viscoelastic. A near clear corneal incision was performed with the steel keratome. A continuous curvilinear capsulorrhexis was performed with a cystotome followed by the capsulorrhexis forceps. Hydrodissection and hydrodelineation were carried out with BSS on a blunt cannula. The lens was removed in a stop and chop  technique and the remaining cortical material was removed with the irrigation-aspiration handpiece. The capsular bag was inflated with viscoelastic and the Technis [id number]  lens was placed in the capsular bag without complication. The remaining viscoelastic was removed from the eye with the irrigation-aspiration handpiece. The wounds were hydrated. The anterior chamber was flushed with BSS and the eye was inflated to physiologic pressure. 0.74ml of Vigamox was placed in the anterior chamber. The wounds were found to be water tight. The eye was dressed with Combigan. The patient was given protective glasses to wear throughout the day and a shield with which to sleep tonight. The patient was also given drops with which to begin a drop regimen today and will  follow-up with me in one day. Implant Name Type Inv. Item Serial No. Manufacturer Lot No. LRB No. Used Action  LENS IOL DIOP 22.0 - [id number] Intraocular Lens LENS IOL DIOP 22.0 [id number]  Right 1 Implanted   Procedure(s) with comments: CATARACT EXTRACTION PHACO AND INTRAOCULAR LENS PLACEMENT (IOC) RIGHT DIABETIC 10.78 00:55.5  (Right) - Diabetic - oral meds  Electronically signed: Birder Robson 07/05/2019 8:19 AM

## 2019-07-05 NOTE — H&P (Signed)
All labs reviewed. Abnormal studies sent to patients PCP when indicated.  Previous H&P reviewed, patient examined, there are NO CHANGES.  Stephanie Brumm Porfilio3/16/20217:55 AM

## 2019-07-05 NOTE — Anesthesia Postprocedure Evaluation (Signed)
Anesthesia Post Note  Patient: Stephanie Sanders  Procedure(s) Performed: CATARACT EXTRACTION PHACO AND INTRAOCULAR LENS PLACEMENT (IOC) RIGHT DIABETIC 10.78 00:55.5  (Right Eye)     Anesthesia Post Evaluation  Ardeth Sportsman

## 2019-07-05 NOTE — Anesthesia Procedure Notes (Signed)
Procedure Name: MAC Performed by: Lenna Hagarty, CRNA Pre-anesthesia Checklist: Patient identified, Emergency Drugs available, Suction available, Timeout performed and Patient being monitored Patient Re-evaluated:Patient Re-evaluated prior to induction Oxygen Delivery Method: Nasal cannula Placement Confirmation: positive ETCO2       

## 2019-07-05 NOTE — Transfer of Care (Signed)
Immediate Anesthesia Transfer of Care Note  Patient: Stephanie Sanders  Procedure(s) Performed: CATARACT EXTRACTION PHACO AND INTRAOCULAR LENS PLACEMENT (IOC) RIGHT DIABETIC 10.78 00:55.5  (Right Eye)  Patient Location: PACU  Anesthesia Type: MAC  Level of Consciousness: awake, alert  and patient cooperative  Airway and Oxygen Therapy: Patient Spontanous Breathing and Patient connected to supplemental oxygen  Post-op Assessment: Post-op Vital signs reviewed, Patient's Cardiovascular Status Stable, Respiratory Function Stable, Patent Airway and No signs of Nausea or vomiting  Post-op Vital Signs: Reviewed and stable  Complications: No apparent anesthesia complications

## 2019-07-06 ENCOUNTER — Encounter: Payer: Self-pay | Admitting: *Deleted

## 2019-07-12 ENCOUNTER — Other Ambulatory Visit: Payer: Medicare Other

## 2019-07-12 ENCOUNTER — Ambulatory Visit: Payer: Medicare Other | Admitting: Hematology and Oncology

## 2019-07-21 ENCOUNTER — Other Ambulatory Visit (HOSPITAL_COMMUNITY): Payer: Self-pay | Admitting: Hematology and Oncology

## 2019-07-21 ENCOUNTER — Encounter: Payer: Self-pay | Admitting: Ophthalmology

## 2019-07-21 ENCOUNTER — Telehealth: Payer: Self-pay | Admitting: *Deleted

## 2019-07-21 ENCOUNTER — Other Ambulatory Visit: Payer: Self-pay

## 2019-07-21 DIAGNOSIS — C50911 Malignant neoplasm of unspecified site of right female breast: Secondary | ICD-10-CM

## 2019-07-21 DIAGNOSIS — Z006 Encounter for examination for normal comparison and control in clinical research program: Secondary | ICD-10-CM

## 2019-07-21 NOTE — Telephone Encounter (Addendum)
T/C made to patient Stephanie Sanders for purpose of scheduling 24 month clinic visit and Cardiac MRI for the [facility] UPBEAT study. The Cardiac MRI has been scheduled to be performed at [facility] on 07/27/2019 at 11:00am and patient was instructed to arrive at 10:30am for her MRI appointment. She has also been scheduled to come to the [facility] in [place] for her 24 month study visit on 07/26/2019 at 10:30am. She will have her study assessments completed first, then have her Central study labs collected at 11:30. Patient is aware that she must fast for 3 hours prior to having study labs collected. She plans to eat breakfast at 8:00 that morning in preparation for the fasting study labs. Yolande Jolly, BSN, MHA, OCN 07/21/2019 12:10 PM

## 2019-07-26 ENCOUNTER — Other Ambulatory Visit: Payer: Self-pay

## 2019-07-26 ENCOUNTER — Inpatient Hospital Stay: Payer: Medicare Other | Attending: Hematology and Oncology

## 2019-07-26 ENCOUNTER — Encounter: Payer: Self-pay | Admitting: *Deleted

## 2019-07-26 DIAGNOSIS — Z171 Estrogen receptor negative status [ER-]: Secondary | ICD-10-CM | POA: Insufficient documentation

## 2019-07-26 DIAGNOSIS — C50911 Malignant neoplasm of unspecified site of right female breast: Secondary | ICD-10-CM

## 2019-07-26 NOTE — Research (Signed)
Patient Stephanie Sanders presents to clinic this morning for purpose of completing her 24 month study visit requirements on the [facility] UPBEAT study. She reports that she has been fasting since 8:00am and will have her study labs collected at about 11:30 this morning. RN assessment was for the 24 month time-point while patient in clinic including the 6 minute walk and Physical Performance Battery, the 24 month Neurocognitive Battery and the 35-month Self-administered questionnaire. She was also administered the 24 month [place] cardiomyopathy questionnaire and the new COVID-19 questionnaire after explaining the purpose for it. Prior to completion of the study procedures, patient's height and weight were measured as follows: height is 68 inches and is 150.4 lbs. These measurements were collected with flat tennis shoes and light weight clothing with patient standing straight up with eyes facing forward, as she was assessed at baseline, but were not collected using a stadiometer as preferred. Patient preferred not to remove her shoes for these measurements. Her BMI was electronically calculated at 22.9 today. After resting for more than 5 minutes, patient's blood pressure was measured in her left arm at 126/60 and heart rate was 59 bpm. VS rechecked after waiting 2 more minutes and b/p was then measured at 118/65 and heart rate 64. These measurements were collected while patient was sitting straight up with her back supported and feet on the floor with legs uncrossed. Waist circumference was measured in accordance with the new protocol amendment, at the top of the hip curve and at the level of the umbilicus with patient not holding her breath at 36 inches. Current medications were verified with patient. She continues to report a little fatigue, but states this has improved since she has completed her chemotherapy and radiation treatments. She has reported some mild problems with her memory and being a little forgetful, but  attributes these changes more to her age than the chemotherapy. Feels all of her side effects from the chemotherapy have now resolved and she denies further issues with fatigue and peripheral neuropathy. Reports noticing these had resolved toward the end of last year. Central study labs were collected per protocol and shipped today. Ms. Icenhower was given a $25.00 [employer] gift card and was thanked for participating in the study. She does still have to go to [facility] to complete her 24 month Cardiac MRI tomorrow and patient was reminded of the time and place for this procedure. Study follow up schedule(annual phone calls) was reviewed with her as well. 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

## 2019-07-27 ENCOUNTER — Ambulatory Visit (HOSPITAL_COMMUNITY)
Admission: RE | Admit: 2019-07-27 | Discharge: 2019-07-27 | Disposition: A | Discharge status: Home / Self Care | Payer: Medicare Other | Source: Ambulatory Visit | Attending: Hematology and Oncology | Admitting: Hematology and Oncology

## 2019-07-27 DIAGNOSIS — Z006 Encounter for examination for normal comparison and control in clinical research program: Secondary | ICD-10-CM | POA: Insufficient documentation

## 2019-07-28 NOTE — Anesthesia Preprocedure Evaluation (Addendum)
Anesthesia Evaluation  Patient identified by MRN, date of birth, ID band Patient awake    Reviewed: Allergy & Precautions, NPO status , Patient's Chart, lab work & pertinent test results, reviewed documented beta blocker date and time   History of Anesthesia Complications Negative for: history of anesthetic complications  Airway Mallampati: II  TM Distance: >3 FB Neck ROM: Full    Dental no notable dental hx.    Pulmonary former smoker,    Pulmonary exam normal breath sounds clear to auscultation       Cardiovascular hypertension, (-) angina(-) DOE Normal cardiovascular exam Rhythm:Regular Rate:Normal  ECHO 05/2017 - Left ventricle: The cavity size was normal. Wall thickness was normal. Systolic function was normal. The estimated ejection fraction was in the range of 60% to 65%. Doppler parameters are consistent with abnormal left ventricular relaxation (grade 1 diastolic dysfunction).  - Mitral valve: There was mild regurgitation.  - Right ventricle: The cavity size was normal. Wall thickness wasnormal. Systolic function was normal.    Neuro/Psych  Headaches,  Neuromuscular disease (Peripheral neuropathy)    GI/Hepatic neg GERD  ,  Endo/Other  diabetes, Type 2Hypothyroidism   Renal/GU Renal disease     Musculoskeletal  (+) Arthritis , Osteoarthritis,    Abdominal Normal abdominal exam  (+)   Peds  Hematology   Anesthesia Other Findings R breast cancer  Reproductive/Obstetrics                            Anesthesia Physical  Anesthesia Plan  ASA: II  Anesthesia Plan: MAC   Post-op Pain Management:    Induction: Intravenous  PONV Risk Score and Plan: 2 and TIVA, Midazolam and Treatment may vary due to age or medical condition  Airway Management Planned: Nasal Cannula  Additional Equipment:   Intra-op Plan:   Post-operative Plan:   Informed Consent: I have reviewed the patients  History and Physical, chart, labs and discussed the procedure including the risks, benefits and alternatives for the proposed anesthesia with the patient or authorized representative who has indicated his/her understanding and acceptance.       Plan Discussed with: CRNA and Anesthesiologist  Anesthesia Plan Comments:        Anesthesia Quick Evaluation

## 2019-07-29 ENCOUNTER — Other Ambulatory Visit
Admission: RE | Admit: 2019-07-29 | Discharge: 2019-07-29 | Disposition: A | Discharge status: Home / Self Care | Payer: Medicare Other | Source: Ambulatory Visit | Attending: Ophthalmology | Admitting: Ophthalmology

## 2019-07-29 DIAGNOSIS — Z20822 Contact with and (suspected) exposure to covid-19: Secondary | ICD-10-CM | POA: Insufficient documentation

## 2019-07-29 DIAGNOSIS — Z01812 Encounter for preprocedural laboratory examination: Secondary | ICD-10-CM | POA: Insufficient documentation

## 2019-07-29 LAB — SARS CORONAVIRUS 2 (TAT 6-24 HRS): SARS Coronavirus 2: NEGATIVE

## 2019-08-01 NOTE — Discharge Instructions (Signed)

## 2019-08-02 ENCOUNTER — Encounter: Payer: Self-pay | Admitting: Ophthalmology

## 2019-08-02 ENCOUNTER — Other Ambulatory Visit: Payer: Self-pay

## 2019-08-02 ENCOUNTER — Ambulatory Visit: Payer: Medicare Other | Admitting: Anesthesiology

## 2019-08-02 ENCOUNTER — Encounter
Admission: RE | Disposition: A | Discharge status: Home / Self Care | Payer: Self-pay | Source: Home / Self Care | Attending: Ophthalmology

## 2019-08-02 ENCOUNTER — Ambulatory Visit
Admission: RE | Admit: 2019-08-02 | Discharge: 2019-08-02 | Disposition: A | Discharge status: Home / Self Care | Payer: Medicare Other | Attending: Ophthalmology | Admitting: Ophthalmology

## 2019-08-02 DIAGNOSIS — Z79899 Other long term (current) drug therapy: Secondary | ICD-10-CM | POA: Insufficient documentation

## 2019-08-02 DIAGNOSIS — I10 Essential (primary) hypertension: Secondary | ICD-10-CM | POA: Diagnosis not present

## 2019-08-02 DIAGNOSIS — Z9841 Cataract extraction status, right eye: Secondary | ICD-10-CM | POA: Insufficient documentation

## 2019-08-02 DIAGNOSIS — Z9221 Personal history of antineoplastic chemotherapy: Secondary | ICD-10-CM | POA: Insufficient documentation

## 2019-08-02 DIAGNOSIS — E1136 Type 2 diabetes mellitus with diabetic cataract: Secondary | ICD-10-CM | POA: Diagnosis not present

## 2019-08-02 DIAGNOSIS — E039 Hypothyroidism, unspecified: Secondary | ICD-10-CM | POA: Diagnosis not present

## 2019-08-02 DIAGNOSIS — Z853 Personal history of malignant neoplasm of breast: Secondary | ICD-10-CM | POA: Insufficient documentation

## 2019-08-02 DIAGNOSIS — G629 Polyneuropathy, unspecified: Secondary | ICD-10-CM | POA: Diagnosis not present

## 2019-08-02 DIAGNOSIS — Z87891 Personal history of nicotine dependence: Secondary | ICD-10-CM | POA: Diagnosis not present

## 2019-08-02 DIAGNOSIS — E7800 Pure hypercholesterolemia, unspecified: Secondary | ICD-10-CM | POA: Insufficient documentation

## 2019-08-02 DIAGNOSIS — E559 Vitamin D deficiency, unspecified: Secondary | ICD-10-CM | POA: Diagnosis not present

## 2019-08-02 DIAGNOSIS — H2512 Age-related nuclear cataract, left eye: Secondary | ICD-10-CM | POA: Insufficient documentation

## 2019-08-02 HISTORY — PX: CATARACT EXTRACTION W/PHACO: SHX586

## 2019-08-02 LAB — GLUCOSE, CAPILLARY
Glucose-Capillary: 146 mg/dL — ABNORMAL HIGH (ref 70–99)
Glucose-Capillary: 136 mg/dL — ABNORMAL HIGH (ref 70–99)

## 2019-08-02 SURGERY — PHACOEMULSIFICATION, CATARACT, WITH IOL INSERTION
Anesthesia: Monitor Anesthesia Care | Site: Eye | Laterality: Left

## 2019-08-02 MED ORDER — MOXIFLOXACIN HCL 0.5 % OP SOLN
OPHTHALMIC | Status: DC | PRN
  Administered 2019-08-02: 0.2 mL via OPHTHALMIC

## 2019-08-02 MED ORDER — BRIMONIDINE TARTRATE-TIMOLOL 0.2-0.5 % OP SOLN
OPHTHALMIC | Status: DC | PRN
  Administered 2019-08-02: 1 [drp] via OPHTHALMIC

## 2019-08-02 MED ORDER — TETRACAINE HCL 0.5 % OP SOLN
1.0000 [drp] | OPHTHALMIC | Status: DC | PRN
  Administered 2019-08-02 (×3): 1 [drp] via OPHTHALMIC

## 2019-08-02 MED ORDER — LACTATED RINGERS IV SOLN: 100.0000 mL/h | INTRAVENOUS | Status: DC

## 2019-08-02 MED ORDER — LIDOCAINE HCL (PF) 2 % IJ SOLN
INTRAOCULAR | Status: DC | PRN
  Administered 2019-08-02: 1 mL

## 2019-08-02 MED ORDER — NA CHONDROIT SULF-NA HYALURON 40-17 MG/ML IO SOLN
INTRAOCULAR | Status: DC | PRN
  Administered 2019-08-02: 1 mL via INTRAOCULAR

## 2019-08-02 MED ORDER — MIDAZOLAM HCL 2 MG/2ML IJ SOLN
INTRAMUSCULAR | Status: DC | PRN
  Administered 2019-08-02: 1.5 mg via INTRAVENOUS

## 2019-08-02 MED ORDER — ARMC OPHTHALMIC DILATING DROPS
1.0000 "application " | OPHTHALMIC | Status: DC | PRN
  Administered 2019-08-02 (×3): 1 via OPHTHALMIC

## 2019-08-02 MED ORDER — EPINEPHRINE PF 1 MG/ML IJ SOLN
INTRAOCULAR | Status: DC | PRN
  Administered 2019-08-02: 51 mL via OPHTHALMIC

## 2019-08-02 MED ORDER — FENTANYL CITRATE (PF) 100 MCG/2ML IJ SOLN
INTRAMUSCULAR | Status: DC | PRN
  Administered 2019-08-02: 50 ug via INTRAVENOUS

## 2019-08-02 SURGICAL SUPPLY — 21 items
CANNULA ANT/CHMB 27G (MISCELLANEOUS) ×2 IMPLANT
CANNULA ANT/CHMB 27GA (MISCELLANEOUS) ×4 IMPLANT
DISSECTOR HYDRO NUCLEUS 50X22 (MISCELLANEOUS) ×2 IMPLANT
GLOVE SURG LX 8.0 MICRO (GLOVE) ×1
GLOVE SURG LX STRL 8.0 MICRO (GLOVE) ×1 IMPLANT
GLOVE SURG TRIUMPH 8.0 PF LTX (GLOVE) ×2 IMPLANT
GOWN STRL REUS W/ TWL LRG LVL3 (GOWN DISPOSABLE) ×2 IMPLANT
GOWN STRL REUS W/TWL LRG LVL3 (GOWN DISPOSABLE) ×2
LENS IOL DIOP 21.5 (Intraocular Lens) ×2 IMPLANT
LENS IOL TECNIS MONO 21.5 (Intraocular Lens) IMPLANT
MARKER SKIN DUAL TIP RULER LAB (MISCELLANEOUS) ×2 IMPLANT
NDL FILTER BLUNT 18X1 1/2 (NEEDLE) ×1 IMPLANT
NEEDLE FILTER BLUNT 18X 1/2SAF (NEEDLE) ×1
NEEDLE FILTER BLUNT 18X1 1/2 (NEEDLE) ×1 IMPLANT
PACK EYE AFTER SURG (MISCELLANEOUS) ×2 IMPLANT
PACK OPTHALMIC (MISCELLANEOUS) ×2 IMPLANT
PACK PORFILIO (MISCELLANEOUS) ×2 IMPLANT
SYR 3ML LL SCALE MARK (SYRINGE) ×2 IMPLANT
SYR TB 1ML LUER SLIP (SYRINGE) ×2 IMPLANT
WATER STERILE IRR 250ML POUR (IV SOLUTION) ×2 IMPLANT
WIPE NON LINTING 3.25X3.25 (MISCELLANEOUS) ×2 IMPLANT

## 2019-08-02 NOTE — Anesthesia Procedure Notes (Signed)
Procedure Name: MAC Performed by: Illeana Edick, CRNA Pre-anesthesia Checklist: Patient identified, Emergency Drugs available, Suction available, Timeout performed and Patient being monitored Patient Re-evaluated:Patient Re-evaluated prior to induction Oxygen Delivery Method: Nasal cannula Placement Confirmation: positive ETCO2       

## 2019-08-02 NOTE — Op Note (Signed)
PREOPERATIVE DIAGNOSIS:  Nuclear sclerotic cataract of the left eye.   POSTOPERATIVE DIAGNOSIS:  Nuclear sclerotic cataract of the left eye.   OPERATIVE PROCEDURE:@   SURGEON:  Birder Robson, MD.   ANESTHESIA:  Anesthesiologist: Heniser, Fredric Dine, MD CRNA: Mayme Genta, CRNA  1.      Managed anesthesia care. 2.     0.40ml of Shugarcaine was instilled following the paracentesis   COMPLICATIONS:  None.   TECHNIQUE:   Stop and chop   DESCRIPTION OF PROCEDURE:  The patient was examined and consented in the preoperative holding area where the aforementioned topical anesthesia was applied to the left eye and then brought back to the Operating Room where the left eye was prepped and draped in the usual sterile ophthalmic fashion and a lid speculum was placed. A paracentesis was created with the side port blade and the anterior chamber was filled with viscoelastic. A near clear corneal incision was performed with the steel keratome. A continuous curvilinear capsulorrhexis was performed with a cystotome followed by the capsulorrhexis forceps. Hydrodissection and hydrodelineation were carried out with BSS on a blunt cannula. The lens was removed in a stop and chop  technique and the remaining cortical material was removed with the irrigation-aspiration handpiece. The capsular bag was inflated with viscoelastic and the Technis [id number] lens was placed in the capsular bag without complication. The remaining viscoelastic was removed from the eye with the irrigation-aspiration handpiece. The wounds were hydrated. The anterior chamber was flushed with BSS and the eye was inflated to physiologic pressure. 0.25ml Vigamox was placed in the anterior chamber. The wounds were found to be water tight. The eye was dressed with Combigan. The patient was given protective glasses to wear throughout the day and a shield with which to sleep tonight. The patient was also given drops with which to begin a drop regimen today  and will follow-up with me in one day. Implant Name Type Inv. Item Serial No. Manufacturer Lot No. LRB No. Used Action  LENS IOL DIOP 21.5 - [id number] Intraocular Lens LENS IOL DIOP 21.5 [id number]  Left 1 Implanted    Procedure(s) with comments: CATARACT EXTRACTION PHACO AND INTRAOCULAR LENS PLACEMENT (IOC) LEFT DIABETIC 6.73  00:40.2 (Left) - Diabetic - oral meds  Electronically signed: Birder Robson 08/02/2019 10:03 AM

## 2019-08-02 NOTE — Anesthesia Postprocedure Evaluation (Signed)
Anesthesia Post Note  Patient: Stephanie Sanders  Procedure(s) Performed: CATARACT EXTRACTION PHACO AND INTRAOCULAR LENS PLACEMENT (IOC) LEFT DIABETIC 6.73  00:40.2 (Left Eye)     Patient location during evaluation: PACU Anesthesia Type: MAC Level of consciousness: awake and alert Pain management: pain level controlled Vital Signs Assessment: post-procedure vital signs reviewed and stable Respiratory status: spontaneous breathing, nonlabored ventilation, respiratory function stable and patient connected to nasal cannula oxygen Cardiovascular status: stable and blood pressure returned to baseline Postop Assessment: no apparent nausea or vomiting Anesthetic complications: no    Brianda Beitler A  Charliee Krenz

## 2019-08-02 NOTE — Transfer of Care (Signed)
Immediate Anesthesia Transfer of Care Note  Patient: Stephanie Sanders  Procedure(s) Performed: CATARACT EXTRACTION PHACO AND INTRAOCULAR LENS PLACEMENT (IOC) LEFT DIABETIC 6.73  00:40.2 (Left Eye)  Patient Location: PACU  Anesthesia Type: MAC  Level of Consciousness: awake, alert  and patient cooperative  Airway and Oxygen Therapy: Patient Spontanous Breathing and Patient connected to supplemental oxygen  Post-op Assessment: Post-op Vital signs reviewed, Patient's Cardiovascular Status Stable, Respiratory Function Stable, Patent Airway and No signs of Nausea or vomiting  Post-op Vital Signs: Reviewed and stable  Complications: No apparent anesthesia complications

## 2019-08-02 NOTE — H&P (Signed)
All labs reviewed. Abnormal studies sent to patients PCP when indicated.  Previous H&P reviewed, patient examined, there are NO CHANGES.  Stephanie Schloemer Porfilio4/13/20219:34 AM

## 2019-08-03 ENCOUNTER — Encounter: Payer: Self-pay | Admitting: *Deleted

## 2019-08-15 ENCOUNTER — Other Ambulatory Visit: Payer: Medicare Other

## 2019-08-15 ENCOUNTER — Ambulatory Visit: Payer: Medicare Other | Admitting: Hematology and Oncology

## 2019-08-15 NOTE — Progress Notes (Signed)
[facility]  [address] [place], [place] [address] Phone: [phone]  Fax: [phone]  Office Visit:  08/17/2019  Referring physician: Baxter Hire, MD  Chief Complaint: Stephanie Sanders is a 74 y.o. 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   HPI: The patient was last seen in the medical oncology clinic on 04/13/2019 via telemedicine. At that time, she felt "good".  She noted some tenderness associated with her seroma, but no drainage.  Bilateral breast MRI on 04/12/2019 revealed no evidence of malignancy.  She was seen by Dr. Windell Moment on 04/25/2019 for Port-A-Cath removal.   She was seen in follow-up on 06/01/2019.  She was doing well with no evidence of recurrent disease.    Patient underwent right cataract surgery on 07/05/2019 and left cataract surgery on 08/02/2019 by Dr. George Ina.   Patient is on the Upbeat trial.  Symptomatically, she feels good today. She is happy she is able to see clearly again after her cataract surgery. She notes the Upbeat trial is going good. She had her last evaluation 2 weeks ago. She denies any shortness of breath and chest pain. She has been experiencing loose stools. She notes her last colonoscopy was on 01/01/2015 with Dr. Rayann Heman which revealed one 5 mm polyp in the cecum. Resected and retrieved. One 5 mm polyp at the hepatic flexure. Resected and retrieved. The examination was otherwise normal on direct and retroflexion views. She notes she sometimes experiences pain under her right arm.  She expects it is due to the ridges from her surgery.    Past Medical History:  Diagnosis Date  . Arthritis    knees  . Cancer ([place]) 04/2017   right breast  . Diabetes mellitus without complication ([place])   . Diabetic nephropathy ([place])   . Headache    migraines  . Heart murmur    ASYMPTOMATIC  . Hypertension   . Hypothyroidism   . Personal history of  chemotherapy 2019   right breast cancer  . Personal history of radiation therapy   . Pneumonia   . Seborrheic keratosis   . Vitamin D deficiency     Past Surgical History:  Procedure Laterality Date  . APPENDECTOMY    . AXILLARY LYMPH NODE DISSECTION Right 02/01/2018   Procedure: AXILLARY LYMPH NODE DISSECTION;  Surgeon: Herbert Pun, MD;  Location: [facility] ORS;  Service: General;  Laterality: Right;  . BREAST BIOPSY Right 05/28/2017   right breast bx 3 areas invasive mamm ca lymph node mets  . BREAST CYST ASPIRATION Bilateral    neg  . BREAST LUMPECTOMY Right 02/01/2018  . CATARACT EXTRACTION W/PHACO Right 07/05/2019   Procedure: CATARACT EXTRACTION PHACO AND INTRAOCULAR LENS PLACEMENT (IOC) RIGHT DIABETIC 10.78 00:55.5 ;  Surgeon: Birder Robson, MD;  Location: [place];  Service: Ophthalmology;  Laterality: Right;  Diabetic - oral meds  . CATARACT EXTRACTION W/PHACO Left 08/02/2019   Procedure: CATARACT EXTRACTION PHACO AND INTRAOCULAR LENS PLACEMENT (IOC) LEFT DIABETIC 6.73  00:40.2;  Surgeon: Birder Robson, MD;  Location: [place];  Service: Ophthalmology;  Laterality: Left;  Diabetic - oral meds  . CHOLECYSTECTOMY    . COLONOSCOPY WITH PROPOFOL N/A 01/01/2015   Procedure: COLONOSCOPY WITH PROPOFOL;  Surgeon: Josefine Class, MD;  Location: [facility] ENDOSCOPY;  Service: Endoscopy;  Laterality: N/A;  . EYE SURGERY Left    eyelid  . PARTIAL MASTECTOMY WITH NEEDLE LOCALIZATION Right 02/01/2018   Procedure:  PARTIAL MASTECTOMY WITH NEEDLE LOCALIZATION;  Surgeon: Herbert Pun, MD;  Location: [facility] OR[facility];  Service: General;  Laterality: Right;  . PORTACATH PLACEMENT Right 06/10/2017   Procedure: INSERTION PORT-A-CATH;  Surgeon: Herbert Pun, MD;  Location: [facility] OR[facility];  Service: General;  Laterality: Right;  . SENTINEL NODE BIOPSY Right 02/01/2018   Procedure: SENTINEL NODE BIOPSY;  Surgeon: Herbert Pun, MD;  Location: [facility];   Service: General;  Laterality: Right;  . STENT PLACEMENT ILIAC ([place] HX)     temporary placement s/p cholecystectomy  . TUBAL LIGATION      Family History  Problem Relation Age of Onset  . Parkinson's disease Mother   . Cancer Maternal Aunt   . Cancer Father   . Lung cancer Father   . Breast cancer Neg Hx     Social History:  reports that she quit smoking about 47 years ago. Her smoking use included cigarettes. She has a 20.00 pack-year smoking history. She has never used smokeless tobacco. She reports that she does not drink alcohol or use drugs. has 3 children (daughters: age 7 and 30; son Jaquelyn Bitter): age 91). She is retired. She worked for the [employer]. Her husband has dementia. She lives in [place]. The patient is alone  today.  Allergies:  Allergies  Allergen Reactions  . Fiorinal [Butalbital-Aspirin-Caffeine] Nausea And Vomiting and Other (See Comments)    SEVERE HEADACHE  . Sulfa Antibiotics Other (See Comments)    Burning esophagus and stomach  . Tramadol Nausea And Vomiting  . Other Rash    STERI-STRIPS-RASH    Current Medications: Current Outpatient Medications  Medication Sig Dispense Refill  . acetaminophen (TYLENOL) 500 MG tablet Take 1,000 mg by mouth every 6 (six) hours as needed for moderate pain or headache.     Marland Kitchen atorvastatin (LIPITOR) 10 MG tablet Take 10 mg by mouth every evening.     . Cholecalciferol 2000 UNITS CAPS Take 4,000 Units by mouth daily.     . Lactobacillus (PROBIOTIC ACIDOPHILUS PO) Take 1 capsule by mouth daily.     Marland Kitchen levothyroxine (SYNTHROID, LEVOTHROID) 50 MCG tablet Take 50 mcg by mouth daily before breakfast.    . loratadine (CLARITIN) 10 MG tablet Take 10 mg by mouth daily.    Marland Kitchen MAGNESIUM GLYCINATE PLUS PO Take 1 tablet by mouth at bedtime.     . metFORMIN (GLUCOPHAGE) 1000 MG tablet Take 1,000 mg by mouth 2 (two) times daily with a meal.    . Multiple Vitamin (MULTIVITAMIN) tablet Take 1 tablet by mouth daily.    . propranolol  (INDERAL) 10 MG tablet Take 10 mg by mouth 2 (two) times daily.    . quinapril (ACCUPRIL) 5 MG tablet Take 5 mg by mouth every morning.     . sodium bicarbonate 650 MG tablet Take 1,300 mg by mouth at bedtime.     No current facility-administered medications for this visit.   Facility-Administered Medications Ordered in Other Visits  Medication Dose Route Frequency Provider Last Rate Last Admin  . sodium chloride flush (NS) 0.9 % injection 10 mL  10 mL Intravenous PRN Lequita Asal, MD   10 mL at 10/21/17 0841    Review of Systems  Constitutional: Negative.  Negative for chills, fever, malaise/fatigue and weight loss.       Feels "good".  HENT: Negative.  Negative for congestion, ear discharge, ear pain, hearing loss, nosebleeds, sinus pain and sore throat.   Eyes: Negative.  Negative for blurred vision and double vision.  Respiratory: Negative.  Negative for cough, sputum production and shortness of breath.   Cardiovascular: Positive for chest pain (tender right breast). Negative for palpitations, orthopnea and leg swelling.  Gastrointestinal: Negative for abdominal pain, blood in stool, constipation, diarrhea, heartburn, melena, nausea and vomiting.       Loose stools.  Genitourinary: Negative.  Negative for dysuria, frequency, hematuria and urgency.  Musculoskeletal: Negative.  Negative for back pain, falls and myalgias.  Skin: Negative.  Negative for itching and rash.  Neurological: Negative for dizziness, tingling, tremors, sensory change, speech change, focal weakness, weakness and headaches.  Endo/Heme/Allergies: Negative.  Does not bruise/bleed easily.  Psychiatric/Behavioral: Negative.  Negative for depression and memory loss. The patient is not nervous/anxious and does not have insomnia.   All other systems reviewed and are negative.  Performance status (ECOG): 0 - Asymptomatic   Physical Exam  Constitutional: She is oriented to person, place, and time. She appears  well-developed and well-nourished. No distress.  HENT:  Head: Normocephalic and atraumatic.  Mouth/Throat: Oropharynx is clear and moist. No oropharyngeal exudate.  Short white hair. Mask.   Eyes: Pupils are equal, round, and reactive to light. Conjunctivae and EOM are normal. Right eye exhibits no discharge. Left eye exhibits no discharge. No scleral icterus.  Neck: No JVD present.  Cardiovascular: Normal rate, regular rhythm, normal heart sounds and intact distal pulses. Exam reveals no gallop and no friction rub.  No murmur heard. Respiratory: Effort normal and breath sounds normal. No respiratory distress. She has no wheezes. She has no rales. She exhibits no tenderness. Right breast exhibits no tenderness.  Slight edema around right breast. Scar tissue present at right breast.    GI: Soft. Bowel sounds are normal. She exhibits no distension and no mass. There is no abdominal tenderness. There is no rebound and no guarding.  Musculoskeletal:        General: Tenderness (under arm close to right breast) and edema (around right breast) present. Normal range of motion.     Cervical back: Normal range of motion and neck supple.  Lymphadenopathy:    She has no cervical adenopathy.  Neurological: She is alert and oriented to person, place, and time.  Skin: Skin is warm and dry. No rash noted. She is not diaphoretic. No erythema. No pallor.  Psychiatric: She has a normal mood and affect. Her behavior is normal. Judgment and thought content normal.    Appointment on 08/17/2019  Component Date Value Ref Range Status  . Sodium 08/17/2019 135  135 - 145 mmol/L Final  . Potassium 08/17/2019 4.1  3.5 - 5.1 mmol/L Final  . Chloride 08/17/2019 101  98 - 111 mmol/L Final  . CO2 08/17/2019 25  22 - 32 mmol/L Final  . Glucose, Bld 08/17/2019 139* 70 - 99 mg/dL Final   Glucose reference range applies only to samples taken after fasting for at least 8 hours.  . BUN 08/17/2019 18  8 - 23 mg/dL Final  .  Creatinine, Ser 08/17/2019 0.81  0.44 - 1.00 mg/dL Final  . Calcium 08/17/2019 9.7  8.9 - 10.3 mg/dL Final  . Total Protein 08/17/2019 7.2  6.5 - 8.1 g/dL Final  . Albumin 08/17/2019 4.5  3.5 - 5.0 g/dL Final  . AST 08/17/2019 24  15 - 41 U/L Final  . ALT 08/17/2019 32  0 - 44 U/L Final  . Alkaline Phosphatase 08/17/2019 49  38 - 126 U/L Final  . Total Bilirubin 08/17/2019 1.0  0.3 - 1.2 mg/dL  Final  . GFR calc non Af Amer 08/17/2019 >60  >60 mL/min Final  . GFR calc Af Amer 08/17/2019 >60  >60 mL/min Final  . Anion gap 08/17/2019 9  5 - 15 Final   Performed at [facility], [address] [place], [place] [address]  . WBC 08/17/2019 5.1  4.0 - 10.5 K/uL Final  . RBC 08/17/2019 4.52  3.87 - 5.11 MIL/uL Final  . Hemoglobin 08/17/2019 14.1  12.0 - 15.0 g/dL Final  . HCT 08/17/2019 41.6  36.0 - 46.0 % Final  . MCV 08/17/2019 92.0  80.0 - 100.0 fL Final  . MCH 08/17/2019 31.2  26.0 - 34.0 pg Final  . MCHC 08/17/2019 33.9  30.0 - 36.0 g/dL Final  . RDW 08/17/2019 13.4  11.5 - 15.5 % Final  . Platelets 08/17/2019 163  150 - 400 K/uL Final  . nRBC 08/17/2019 0.0  0.0 - 0.2 % Final  . Neutrophils Relative % 08/17/2019 57  % Final  . Neutro Abs 08/17/2019 2.9  1.7 - 7.7 K/uL Final  . Lymphocytes Relative 08/17/2019 21  % Final  . Lymphs Abs 08/17/2019 1.1  0.7 - 4.0 K/uL Final  . Monocytes Relative 08/17/2019 10  % Final  . Monocytes Absolute 08/17/2019 0.5  0.1 - 1.0 K/uL Final  . Eosinophils Relative 08/17/2019 11  % Final  . Eosinophils Absolute 08/17/2019 0.6* 0.0 - 0.5 K/uL Final  . Basophils Relative 08/17/2019 1  % Final  . Basophils Absolute 08/17/2019 0.0  0.0 - 0.1 K/uL Final  . Immature Granulocytes 08/17/2019 0  % Final  . Abs Immature Granulocytes 08/17/2019 0.02  0.00 - 0.07 K/uL Final   Performed at [facility], [address], [place], [place] [address]    Assessment:  Stephanie Sanders is a 74 y.o. female with multi-focal triple negative clinical  stage T1cN1Mx right breast cancers/p biopsy on 05/28/2017. Pathologyrevealed two grade II invasive mammary carcinomas of no special type (4 cm from the nipple and 5 cm from the nipple). Tumors were in close proximity. Lymph node biopsy confirmed metastatic carcinoma of breast origin. Tumor is ER negative (<1%), PR negative and Her 2/neu 2+ by IHC. [facility] genetic testingon 07/21/2017 revealed a variant of uncertain significance in RECQL4.  Right sided mammogram and ultrasound on 05/21/2017 revealed 2 highly suspicious masses in the right breast at the 10:30 position 4 cm from nipple (1.8 x 1.2 x 1.1 cm) and at the 10:30 position 5 cm from nipple (1.1 x 0.9 x 0.9 cm). There was a suspicious 2.7 x 1 x 2.2 cm lymph node in the right axilla with asymmetric nodular cortical thickening.  Ultrasound guided biopsyof the 2 masses and lymph node on 05/28/2017 revealed grade II invasive mammary carcinoma of no special type (4 cm from the nipple and 5 cm from the nipple). Lymph node biopsy revealed metastatic carcinoma of breast origin. Tumor is ER negative (<1%), PR negative and Her 2/neu 2+ by IHC. FISH was negative.   CA 27.29has been followed: 16.5 on 06/04/2017, 14.3 on 05/21/2018, 20.3 on 06/18/2018, 27.5 on 11/16/2018, and 14.0 on 03/22/2019. CA15-3was 17.6 on 06/04/2017.  [place] 06/09/2017 revealed an EF of 60-65%. She has enrolled on the UPBEAT clinical trial.  She received 4 cycles of AC(06/23/2017 - 09/08/2017) with Margarette Canada support. Cycle #1 was complicated by influenza A. Nadir counts included an ANC of 0 and platelet count of 71,000. Cycle #2 postponed due to slow recovery from influenza and  excisional cyst removal with surrounding cellulitis (treated with 10 days of doxycycline).   She received 12 weeks of Taxol (09/29/2017 - 01/05/2018). She received GCSFafter week #3 and 5 Taxol secondary to borderline counts. Cycle #5 was postponed on 10/29/2017 due to patient's  wishes to be treated on Tuesdays only. Week #7 Taxol was held secondary to neutropenia (ANC 200).  Right mammogram and ultrasoundon 09/28/2017 revealed the 2 adjacent masses in the right breast at the 10:30 o'clock position had both decreased in size (1.8 x 1.1 x 1.2 cm to 1.2 x 0.7 x 1.1 cm; 11 x 9 x 9 mm to 7 x 6 x 6 mm) as well as the metastatic right axillary lymph node (2.7 cm to 1.6 x 0.9 x 1.7 cm).   Right partial mastectomyand sentinel lymph node biopsy on 02/01/2018 Pathology revealed 10 mm grade II invasive carcinoma of no special type. The tumor bed encompassed the biopsy sites, with foci of residual carcinoma at both biopsy sites and also involving the tissue between the biopsy sites. Foci of residual carcinoma range from 1 to 10 mm.There was no DCIS. Distance from closest margin was 1 mm. Four of 4 lymph nodes were negative. Pathologic stage was ypT1b (m) ypN0 (sn).  She received breast radiationfrom 03/10/2018 - 05/07/2018.  She received 8 cycles ofXeloda(07/06/2018 - 12/29/2018).  Bilateral mammogram and right unilateral ultrasound on 01/27/2019 revealed no findings to explain the palpable areas of concern. There was no evidence of malignancy in either breast.  Bilateral breast MRI on 04/12/2019 showed no evidence of malignancy in either breast. The right breast breast showed posttreatment changes.   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 Normal labs on 11/24/2017 included: B12, folate, TSH, and copper.  Bone densityon 07/01/2018 was normal witha T score of -0.8 and the RIGHTfemoral neck.  [facility] with with fever, cough and generalized weakness. Nasal swab was + forinfluenza A. She completed a course of Tamiflu.  Symptomatically, she feels good.  She notes some pain under her right axilla.  Exam reveals no evidence of recurrent disease.  Plan: 1.   Labs today: CBC with diff, CMP, CA  27.29. 2.Multifocal triple negative RIGHT breast cancer She is s/p neoadjuvant chemotherapy.             4 cycles of AC and 12 weeks of Taxol (last 01/05/2018). She is s/p right partial mastectomy and SLN biopsy (02/01/2018). Sheis s/p 8 cycles of Xeloda (last 12/29/2018).             Bilateral mammogram and right breast ultrasound on 01/27/2019 revealed no evidence of malignancy.   Bilateral breast MRI on 04/12/2019 showed no evidence of malignancy in either breast.  Clinically she is doing well. Exam reveals no evidence of recurrent disease. Continue surveillance. 3.Right breast/axillary tenderness Seroma has resolved.  Site remains slightly tender with discomfort in her axilla.  Reassurance provided. 4.   RTC in 4 months for MD assessment and labs (CBC with diff, CMP, CA27.29).  I discussed the assessment and treatment plan with the patient.  The patient was provided an opportunity to ask questions and all were answered.  The patient agreed with the plan and demonstrated an understanding of the instructions.  The patient was advised to call back if the symptoms worsen or if the condition fails to improve as anticipated.   Lequita Asal, MD, PhD    08/17/2019, 11:08 AM  I, Heywood Footman, am acting as  a scribe for Lequita Asal, MD.  I, [place] Mike Gip, MD, have reviewed the above documentation for accuracy and completeness, and I agree with the above.

## 2019-08-16 ENCOUNTER — Encounter: Payer: Self-pay | Admitting: Hematology and Oncology

## 2019-08-16 ENCOUNTER — Other Ambulatory Visit: Payer: Self-pay

## 2019-08-16 NOTE — Progress Notes (Signed)
No new changes noted today. The patient Name and DOB has been verified  By phone today.

## 2019-08-17 ENCOUNTER — Inpatient Hospital Stay: Payer: Medicare Other

## 2019-08-17 ENCOUNTER — Encounter: Payer: Self-pay | Admitting: Hematology and Oncology

## 2019-08-17 ENCOUNTER — Inpatient Hospital Stay (HOSPITAL_BASED_OUTPATIENT_CLINIC_OR_DEPARTMENT_OTHER): Payer: Medicare Other | Admitting: Hematology and Oncology

## 2019-08-17 VITALS — BP 132/56 | HR 68 | Temp 97.5°F | Resp 18 | Ht 68.0 in | Wt 148.5 lb

## 2019-08-17 DIAGNOSIS — C50911 Malignant neoplasm of unspecified site of right female breast: Secondary | ICD-10-CM

## 2019-08-17 DIAGNOSIS — Z171 Estrogen receptor negative status [ER-]: Secondary | ICD-10-CM | POA: Diagnosis not present

## 2019-08-17 LAB — COMPREHENSIVE METABOLIC PANEL
ALT: 32 U/L (ref 0–44)
AST: 24 U/L (ref 15–41)
Albumin: 4.5 g/dL (ref 3.5–5.0)
Alkaline Phosphatase: 49 U/L (ref 38–126)
Anion gap: 9 (ref 5–15)
BUN: 18 mg/dL (ref 8–23)
CO2: 25 mmol/L (ref 22–32)
Calcium: 9.7 mg/dL (ref 8.9–10.3)
Chloride: 101 mmol/L (ref 98–111)
Creatinine, Ser: 0.81 mg/dL (ref 0.44–1.00)
GFR calc Af Amer: >60 mL/min (ref >60)
GFR calc non Af Amer: >60 mL/min (ref >60)
Glucose, Bld: 139 mg/dL — ABNORMAL HIGH (ref 70–99)
Potassium: 4.1 mmol/L (ref 3.5–5.1)
Sodium: 135 mmol/L (ref 135–145)
Total Bilirubin: 1 mg/dL (ref 0.3–1.2)
Total Protein: 7.2 g/dL (ref 6.5–8.1)

## 2019-08-17 LAB — CBC WITH DIFFERENTIAL/PLATELET
Abs Immature Granulocytes: 0.02 10*3/uL (ref 0.00–0.07)
Basophils Absolute: 0 10*3/uL (ref 0.0–0.1)
Basophils Relative: 1 %
Eosinophils Absolute: 0.6 10*3/uL — ABNORMAL HIGH (ref 0.0–0.5)
Eosinophils Relative: 11 %
HCT: 41.6 % (ref 36.0–46.0)
Hemoglobin: 14.1 g/dL (ref 12.0–15.0)
Immature Granulocytes: 0 %
Lymphocytes Relative: 21 %
Lymphs Abs: 1.1 10*3/uL (ref 0.7–4.0)
MCH: 31.2 pg (ref 26.0–34.0)
MCHC: 33.9 g/dL (ref 30.0–36.0)
MCV: 92 fL (ref 80.0–100.0)
Monocytes Absolute: 0.5 10*3/uL (ref 0.1–1.0)
Monocytes Relative: 10 %
Neutro Abs: 2.9 10*3/uL (ref 1.7–7.7)
Neutrophils Relative %: 57 %
Platelets: 163 10*3/uL (ref 150–400)
RBC: 4.52 MIL/uL (ref 3.87–5.11)
RDW: 13.4 % (ref 11.5–15.5)
WBC: 5.1 10*3/uL (ref 4.0–10.5)
nRBC: 0 % (ref 0.0–0.2)

## 2019-08-18 LAB — CANCER ANTIGEN 27.29: CA 27.29: 19.2 U/mL (ref 0.0–38.6)

## 2019-08-25 ENCOUNTER — Telehealth: Payer: Self-pay | Admitting: *Deleted

## 2019-08-25 NOTE — Telephone Encounter (Signed)
T/C made to patient Stephanie Sanders for purpose of completing [facility] events from, which was inadvertently missed when patient was in clinic for her 24 month study visit. Patient answered and completed the brief questionnaire. Yolande Jolly, BSN, MHA, OCN 08/25/2019 2:35 PM

## 2019-09-28 ENCOUNTER — Other Ambulatory Visit: Payer: Self-pay | Admitting: Internal Medicine

## 2019-09-28 ENCOUNTER — Other Ambulatory Visit: Payer: Self-pay | Admitting: Pediatrics

## 2019-09-28 DIAGNOSIS — R1032 Left lower quadrant pain: Secondary | ICD-10-CM

## 2019-10-17 ENCOUNTER — Ambulatory Visit
Admission: RE | Admit: 2019-10-17 | Discharge: 2019-10-17 | Disposition: A | Discharge status: Home / Self Care | Payer: Medicare Other | Source: Ambulatory Visit | Attending: Internal Medicine | Admitting: Internal Medicine

## 2019-10-17 ENCOUNTER — Other Ambulatory Visit: Payer: Self-pay

## 2019-10-17 DIAGNOSIS — R1032 Left lower quadrant pain: Secondary | ICD-10-CM

## 2019-10-17 MED ORDER — IOHEXOL 300 MG/ML  SOLN
100.0000 mL | Freq: Once | INTRAMUSCULAR | Status: AC | PRN
  Administered 2019-10-17: 100 mL via INTRAVENOUS

## 2019-12-30 ENCOUNTER — Other Ambulatory Visit: Payer: Self-pay | Admitting: General Surgery

## 2019-12-30 DIAGNOSIS — Z853 Personal history of malignant neoplasm of breast: Secondary | ICD-10-CM

## 2020-01-03 IMAGING — MG MM DIGITAL DIAGNOSTIC BILAT W/ TOMO W/ CAD
8 of 15 series · 8 of 40 positions shown · non-contrast
Comparison: Previous exam(s).

CLINICAL DATA: Palpable and painful area in the upper right breast
as well as a palpable and painful area at the prior lumpectomy site
in the right breast.The patient is also here for annual mammogram of
the left breast.

EXAM:
DIGITAL DIAGNOSTIC BILATERAL MAMMOGRAM WITH CAD AND TOMO
ULTRASOUND RIGHT BREAST

[R MLO]
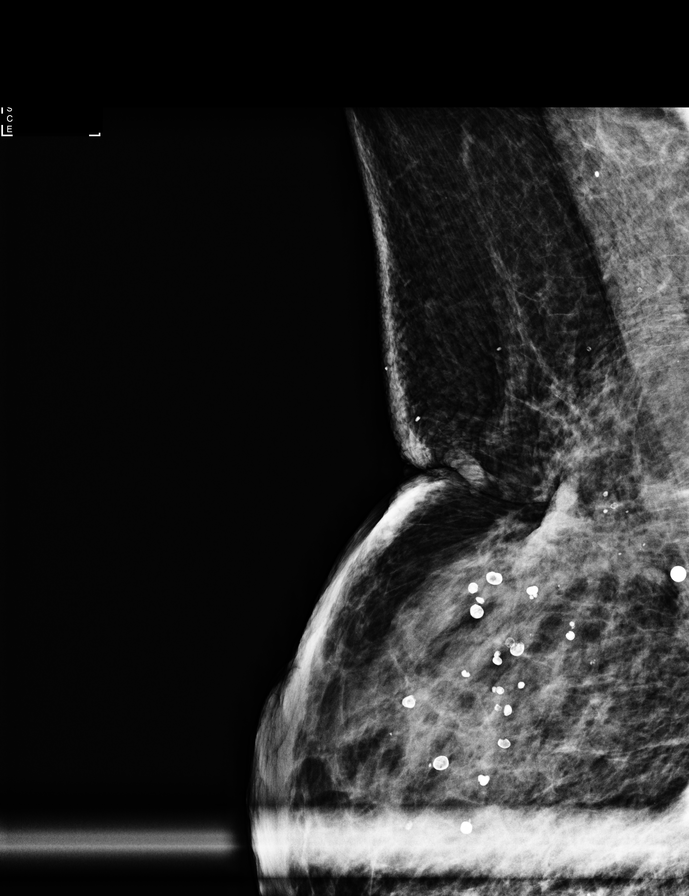

[R MLO synth-2D]
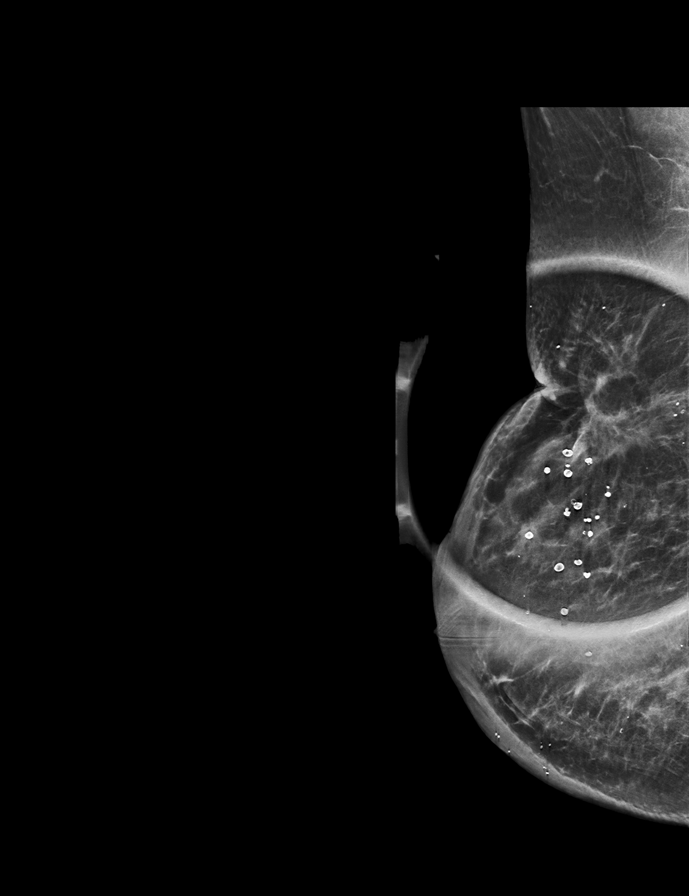

[R CC synth-2D]
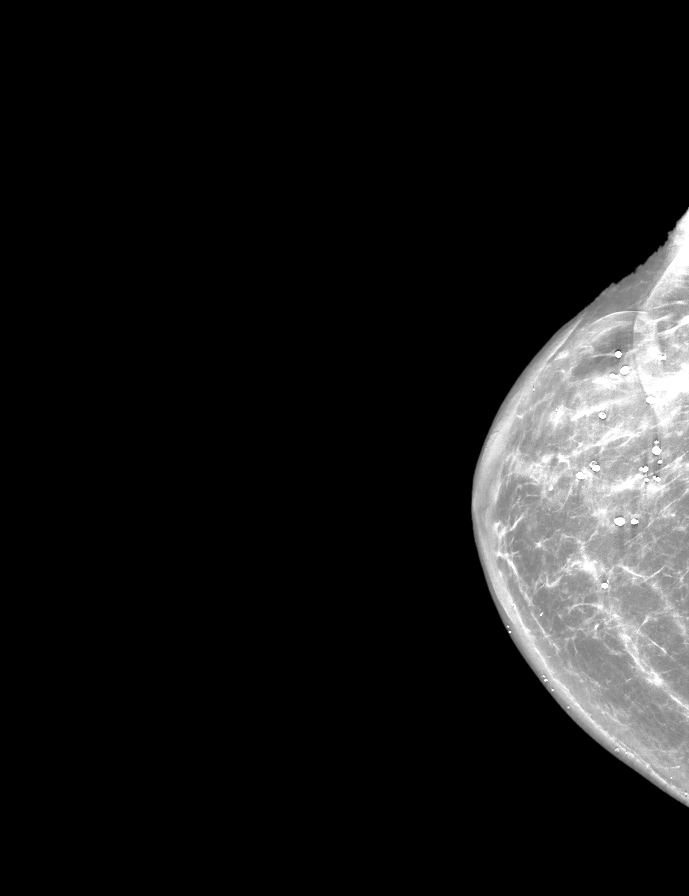

[L MLO synth-2D (1 of 2)]
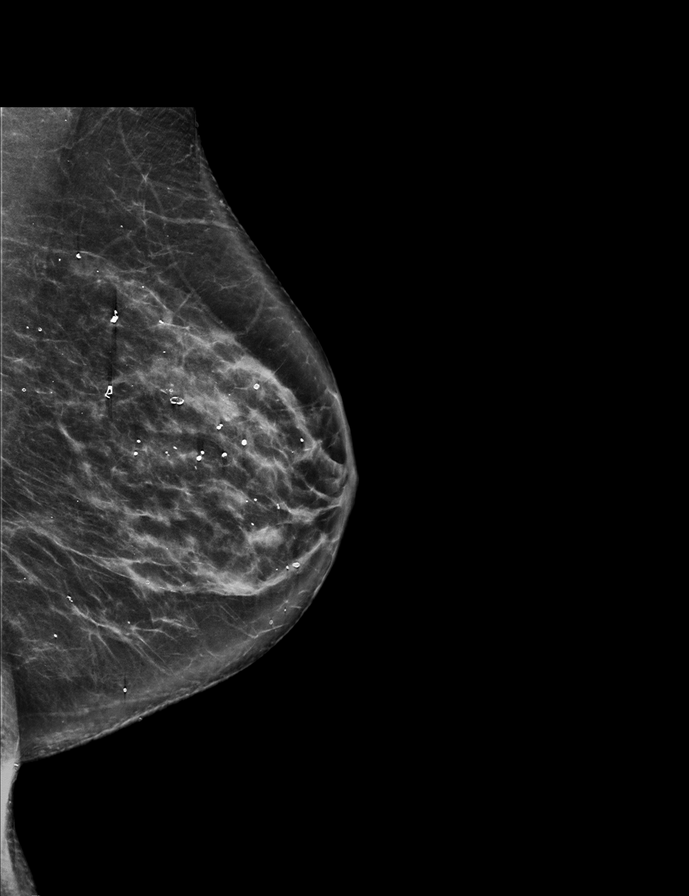

[L CC synth-2D]
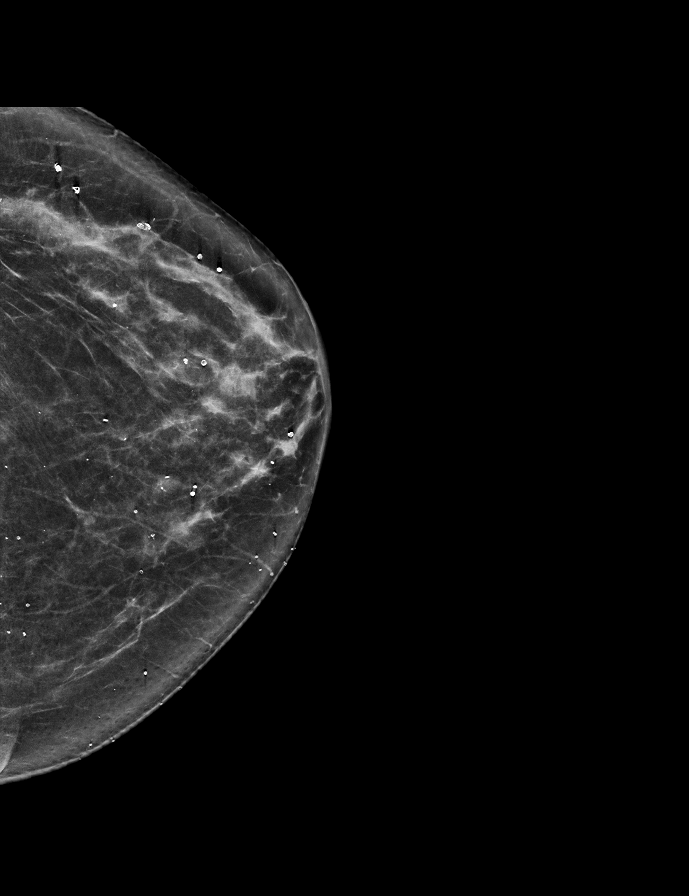

[L ML synth-2D]
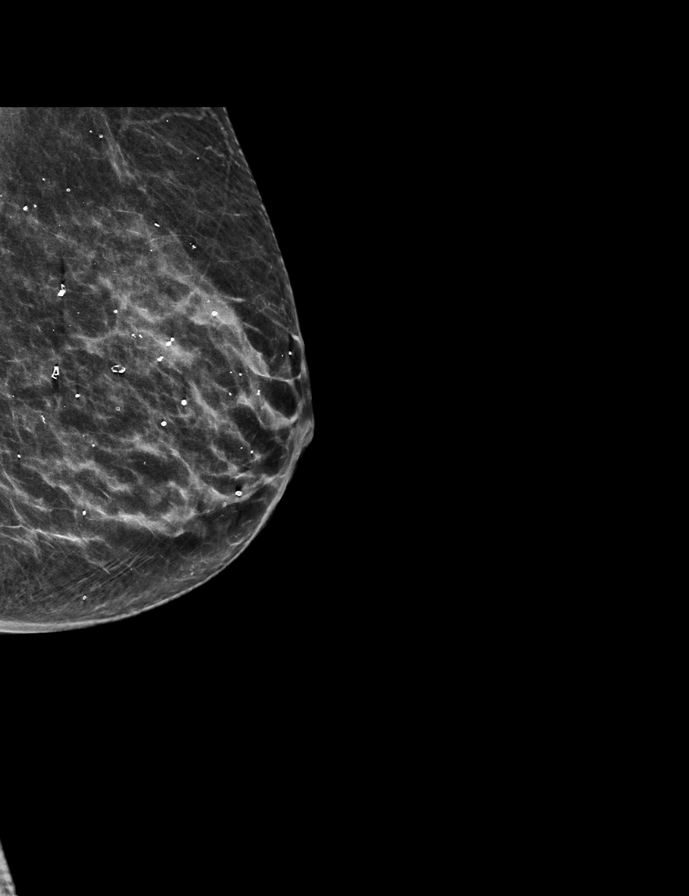

[L MLO synth-2D (2 of 2)]
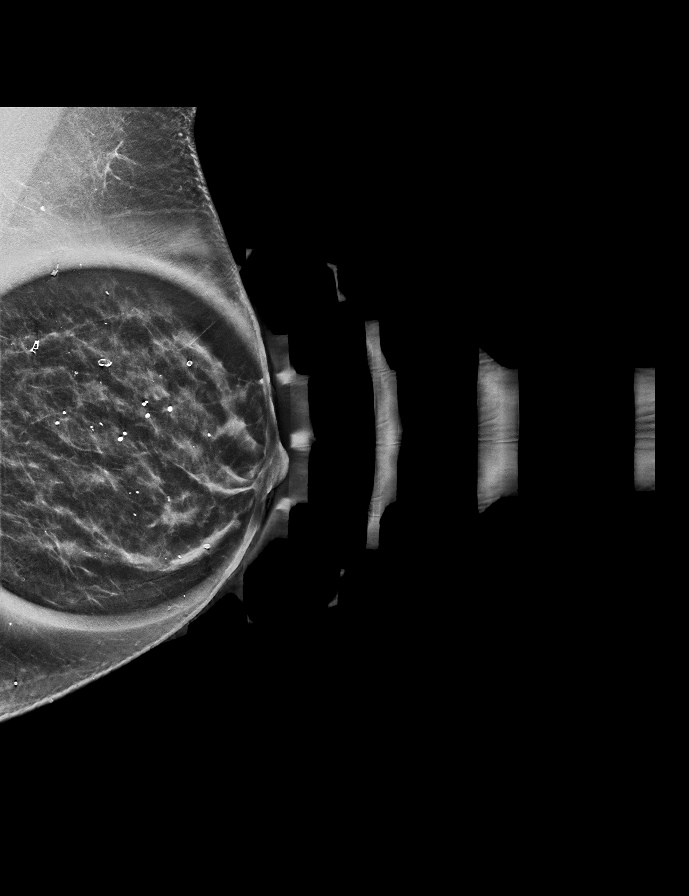

[R CC tomo · tomo slice 37/72.0]
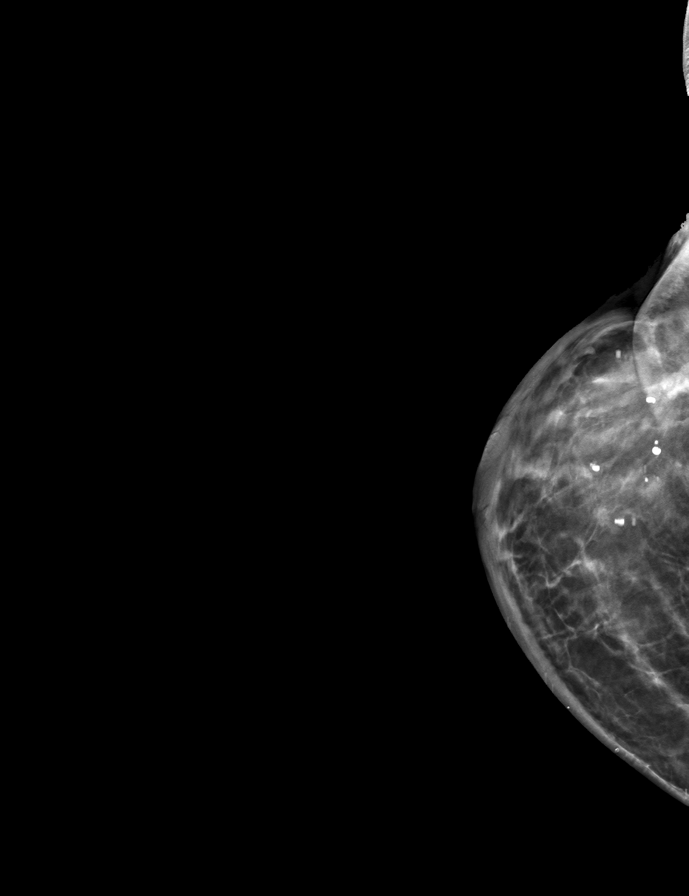

[8 of 40 positions shown; findings below may reference images not displayed]

ACR Breast Density Category c: The breast tissue is heterogeneously
dense, which may obscure small masses.
FINDINGS: Postsurgical changes are seen in the right breast. No suspicious
mammographic findings are seen underlying the skin marker in the
upper inner right breast at the palpable area of concern. No
suspicious mass, microcalcification, or other finding is identified
in either breast.

Mammographic images were processed with CAD.

Targeted right breast ultrasound was performed in the palpable areas
of concern.

At 01:30 o'clock 9 cm from the nipple, no suspicious solid or cystic
mass is identified.

At 10 o'clock 4 cm from the nipple in the area of surgical scar
expected postoperative changes are identified. No suspicious solid
or cystic mass is identified.
IMPRESSION: No findings to explain the palpable areas of concern. No evidence of
malignancy in either breast.

RECOMMENDATION:
Any further workup of the patient's symptoms should be based on the
clinical assessment. Recommend routine annual screening mammogram in
1 year.

I have discussed the findings and recommendations with the patient.
If applicable, a reminder letter will be sent to the patient
regarding the next appointment.

BI-RADS CATEGORY  2: Benign.

## 2020-01-05 ENCOUNTER — Encounter: Payer: Self-pay | Admitting: Radiology

## 2020-01-05 DIAGNOSIS — C50911 Malignant neoplasm of unspecified site of right female breast: Secondary | ICD-10-CM

## 2020-01-05 NOTE — Research (Signed)
[facility] [id number] - Month 24 Consent Addendum  01/05/2020      10:00AM  PHONE CONSENT: Confirmed I was speaking with Stephanie Sanders. Informed [name] that UPBEAT added an additional social determinants questionnaire that was completely optional. Patient expressed interest in participation, so I initially gave her an overview of the purpose of the addendum, the involvement of the collection of her e-mail address and consent to receive the survey via e-mail. I ready the consent addendum script to patient and verified she had no questions. Patient provided me her e-mail address and I signed the signature section. I will let clinical research nurse at [facility], RN know that patient is interested in receiving the survey, along with patients e-mail address so she can update information in [place]. I thanked patient for her time and willingness to complete additional survey.  [place] RT(R)(T) Scientist, physiological

## 2020-02-02 ENCOUNTER — Ambulatory Visit
Admission: RE | Admit: 2020-02-02 | Discharge: 2020-02-02 | Disposition: A | Discharge status: Home / Self Care | Payer: Medicare Other | Source: Ambulatory Visit | Attending: General Surgery | Admitting: General Surgery

## 2020-02-02 ENCOUNTER — Other Ambulatory Visit: Payer: Self-pay

## 2020-02-02 DIAGNOSIS — Z853 Personal history of malignant neoplasm of breast: Secondary | ICD-10-CM

## 2020-02-15 NOTE — Progress Notes (Signed)
[facility]  [address], [address] [place], [place] [address] Phone: [phone]  Fax: [phone]  Office Visit:  02/16/2020  Referring physician: Baxter Hire, MD  Chief Complaint: Stephanie Sanders is a 74 y.o. 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   HPI: The patient was last seen in the medical oncology clinic on 08/17/2019. At that time, she felt good.  She noted some pain under her right axilla.  Exam revealed no evidence of recurrent disease. Hematocrit was 41.6, hemoglobin 14.1, platelets 163,000, WBC 5,100. CMP was normal. CA27.29 was 19.2. We discussed surveillance.  Abdomen and pelvis CT on 10/17/2019 revealed no acute, inflammatory, or metastatic process in the abdomen or pelvis. There was aortic atherosclerosis.  Diagnostic bilateral mammogram on 02/02/2020 revealed no evidence of malignancy.  The patient saw Dr. Windell Moment on 02/09/2020. She was feeling much better after completion of her therapies. Her mood and energy were good. Follow-up was planned in 1 year after mammogram.  During the interim, she has been "good." She reports diarrhea, which is under control. She describes her stools as "flat." She has had trouble scheduling a colonoscopy because of transportation issues.  She performs monthly breast exams and has no concerns. She still has occasional right breast tenderness which is worse in the mornings. She would like a breast MRI in 6 months.   Past Medical History:  Diagnosis Date  . Arthritis    knees  . Cancer ([place]) 04/2017   right breast  . Diabetes mellitus without complication ([place])   . Diabetic nephropathy ([place])   . Headache    migraines  . Heart murmur    ASYMPTOMATIC  . Hypertension   . Hypothyroidism   . Personal history of chemotherapy 2019   right breast cancer  . Personal history of radiation therapy   . Pneumonia   . Seborrheic keratosis   .  Vitamin D deficiency     Past Surgical History:  Procedure Laterality Date  . APPENDECTOMY    . AXILLARY LYMPH NODE DISSECTION Right 02/01/2018   Procedure: AXILLARY LYMPH NODE DISSECTION;  Surgeon: Herbert Pun, MD;  Location: [facility];  Service: General;  Laterality: Right;  . BREAST BIOPSY Right 05/28/2017   right breast bx 3 areas invasive mamm ca lymph node mets  . BREAST CYST ASPIRATION Bilateral    neg  . BREAST LUMPECTOMY Right 02/01/2018  . CATARACT EXTRACTION W/PHACO Right 07/05/2019   Procedure: CATARACT EXTRACTION PHACO AND INTRAOCULAR LENS PLACEMENT (IOC) RIGHT DIABETIC 10.78 00:55.5 ;  Surgeon: Birder Robson, MD;  Location: [place];  Service: Ophthalmology;  Laterality: Right;  Diabetic - oral meds  . CATARACT EXTRACTION W/PHACO Left 08/02/2019   Procedure: CATARACT EXTRACTION PHACO AND INTRAOCULAR LENS PLACEMENT (IOC) LEFT DIABETIC 6.73  00:40.2;  Surgeon: Birder Robson, MD;  Location: [place];  Service: Ophthalmology;  Laterality: Left;  Diabetic - oral meds  . CHOLECYSTECTOMY    . COLONOSCOPY WITH PROPOFOL N/A 01/01/2015   Procedure: COLONOSCOPY WITH PROPOFOL;  Surgeon: Josefine Class, MD;  Location: [facility] ENDOSCOPY;  Service: Endoscopy;  Laterality: N/A;  . EYE SURGERY Left    eyelid  . PARTIAL MASTECTOMY WITH NEEDLE LOCALIZATION Right 02/01/2018   Procedure: PARTIAL MASTECTOMY WITH NEEDLE LOCALIZATION;  Surgeon: Herbert Pun, MD;  Location: [facility];  Service: General;  Laterality: Right;  . PORTACATH PLACEMENT Right 06/10/2017   Procedure: INSERTION PORT-A-CATH;  Surgeon: Herbert Pun, MD;  Location: [facility];  Service: General;  Laterality: Right;  . SENTINEL NODE BIOPSY Right 02/01/2018   Procedure: SENTINEL NODE BIOPSY;  Surgeon: Herbert Pun, MD;  Location: [facility];  Service: General;  Laterality: Right;  . STENT PLACEMENT ILIAC ([place] HX)     temporary placement s/p cholecystectomy  . TUBAL  LIGATION      Family History  Problem Relation Age of Onset  . Parkinson's disease Mother   . Cancer Maternal Aunt   . Cancer Father   . Lung cancer Father   . Breast cancer Neg Hx     Social History:  reports that she quit smoking about 47 years ago. Her smoking use included cigarettes. She has a 20.00 pack-year smoking history. She has never used smokeless tobacco. She reports that she does not drink alcohol and does not use drugs. has 3 children (daughters: age 36 and 76; son Jaquelyn Bitter): age 64). She is retired. She worked for the [employer]. Her husband has dementia. She lives in [place]. The patient is alone today.  Allergies:  Allergies  Allergen Reactions  . Fiorinal [Butalbital-Aspirin-Caffeine] Nausea And Vomiting and Other (See Comments)    SEVERE HEADACHE  . Sulfa Antibiotics Other (See Comments)    Burning esophagus and stomach  . Tramadol Nausea And Vomiting  . Other Rash    STERI-STRIPS-RASH    Current Medications: Current Outpatient Medications  Medication Sig Dispense Refill  . ascorbic acid (VITAMIN C) 1000 MG tablet Take by mouth.    Marland Kitchen atorvastatin (LIPITOR) 10 MG tablet Take 10 mg by mouth every evening.     Marland Kitchen atorvastatin (LIPITOR) 10 MG tablet Take 1 tablet by mouth daily.    . Colesevelam HCl 3.75 g PACK Take by mouth.    . levothyroxine (SYNTHROID, LEVOTHROID) 50 MCG tablet Take 50 mcg by mouth daily before breakfast.    . metFORMIN (GLUCOPHAGE) 1000 MG tablet Take 1,000 mg by mouth 2 (two) times daily with a meal.    . Multiple Vitamin (MULTIVITAMIN) tablet Take 1 tablet by mouth daily.    . Omega-3 Fatty Acids (FISH OIL) 1000 MG CAPS Take 1,000 mg by mouth daily.    . propranolol (INDERAL) 10 MG tablet Take 10 mg by mouth 2 (two) times daily.    . Quercetin 250 MG TABS Take 500 mg by mouth in the morning and at bedtime.    . quinapril (ACCUPRIL) 5 MG tablet Take 5 mg by mouth every morning.     . sodium bicarbonate 650 MG tablet Take 1,300 mg by mouth  at bedtime.    Marland Kitchen zinc gluconate 50 MG tablet Take 50 mg by mouth daily.    Marland Kitchen acetaminophen (TYLENOL) 500 MG tablet Take 1,000 mg by mouth every 6 (six) hours as needed for moderate pain or headache.  (Patient not taking: Reported on 02/16/2020)    . Cholecalciferol 2000 UNITS CAPS Take 4,000 Units by mouth daily.  (Patient not taking: Reported on 02/16/2020)    . Lactobacillus (PROBIOTIC ACIDOPHILUS PO) Take 1 capsule by mouth daily.  (Patient not taking: Reported on 02/16/2020)    . loratadine (CLARITIN) 10 MG tablet Take 10 mg by mouth daily. (Patient not taking: Reported on 02/16/2020)    . MAGNESIUM GLYCINATE PLUS PO Take 1 tablet by mouth at bedtime.      No current facility-administered medications for this visit.   Facility-Administered Medications Ordered in Other Visits  Medication Dose Route Frequency Provider Last Rate Last Admin  . sodium chloride  flush (NS) 0.9 % injection 10 mL  10 mL Intravenous PRN Lequita Asal, MD   10 mL at 10/21/17 0841   Review of Systems  Constitutional: Negative.  Negative for chills, fever, malaise/fatigue and weight loss (up 5 lbs).       Feels "good".  HENT: Negative.  Negative for congestion, ear discharge, ear pain, hearing loss, nosebleeds, sinus pain, sore throat and tinnitus.   Eyes: Negative.  Negative for blurred vision.  Respiratory: Negative.  Negative for cough, hemoptysis, sputum production and shortness of breath.   Cardiovascular: Negative.  Negative for chest pain, palpitations and leg swelling.  Gastrointestinal: Negative for abdominal pain, blood in stool, constipation, diarrhea (under control), heartburn, melena, nausea and vomiting.  Genitourinary: Negative.  Negative for dysuria, frequency, hematuria and urgency.  Musculoskeletal: Negative for back pain, joint pain, myalgias and neck pain.       Right breast tenderness.  Skin: Negative.  Negative for itching and rash.  Neurological: Negative for dizziness, tingling, sensory  change, weakness and headaches.  Endo/Heme/Allergies: Negative.  Does not bruise/bleed easily.  Psychiatric/Behavioral: Negative.  Negative for depression and memory loss. The patient is not nervous/anxious and does not have insomnia.   All other systems reviewed and are negative.  Performance status (ECOG): 0  Physical Exam Constitutional:      General: She is not in acute distress.    Appearance: She is well-developed. She is not diaphoretic.  HENT:     Head: Normocephalic and atraumatic.     Mouth/Throat:     Mouth: Mucous membranes are moist.     Pharynx: Oropharynx is clear. No oropharyngeal exudate.  Eyes:     General: No scleral icterus.    Extraocular Movements: Extraocular movements intact.     Conjunctiva/sclera: Conjunctivae normal.     Pupils: Pupils are equal, round, and reactive to light.  Neck:     Vascular: No JVD.  Cardiovascular:     Rate and Rhythm: Normal rate and regular rhythm.     Heart sounds: Normal heart sounds. No murmur heard.  No friction rub. No gallop.   Pulmonary:     Effort: Pulmonary effort is normal. No respiratory distress.     Breath sounds: Normal breath sounds. No wheezing or rales.  Chest:     Chest wall: No tenderness.     Breasts:        Right: Swelling (inferiorly s/p radiation), skin change (scar at 10-12 o clock) and tenderness present. No bleeding or nipple discharge.        Left: No swelling, bleeding, mass, nipple discharge, skin change or tenderness.  Abdominal:     General: Bowel sounds are normal. There is no distension.     Palpations: Abdomen is soft. There is no hepatomegaly, splenomegaly or mass.     Tenderness: There is no abdominal tenderness. There is no guarding or rebound.  Musculoskeletal:        General: Normal range of motion.     Cervical back: Normal range of motion and neck supple.     Right lower leg: No edema.     Left lower leg: No edema.  Lymphadenopathy:     Head:     Right side of head: No  preauricular, posterior auricular or occipital adenopathy.     Left side of head: No preauricular, posterior auricular or occipital adenopathy.     Cervical: No cervical adenopathy.     Upper Body:     Right upper body: No  supraclavicular or axillary adenopathy.     Left upper body: No supraclavicular or axillary adenopathy.     Lower Body: No right inguinal adenopathy. No left inguinal adenopathy.  Skin:    General: Skin is warm and dry.     Coloration: Skin is not pale.     Findings: No erythema or rash.  Neurological:     Mental Status: She is alert and oriented to person, place, and time.  Psychiatric:        Behavior: Behavior normal.        Thought Content: Thought content normal.        Judgment: Judgment normal.     Appointment on 02/16/2020  Component Date Value Ref Range Status  . Sodium 02/16/2020 136  135 - 145 mmol/L Final  . Potassium 02/16/2020 4.2  3.5 - 5.1 mmol/L Final  . Chloride 02/16/2020 100  98 - 111 mmol/L Final  . CO2 02/16/2020 27  22 - 32 mmol/L Final  . Glucose, Bld 02/16/2020 144* 70 - 99 mg/dL Final   Glucose reference range applies only to samples taken after fasting for at least 8 hours.  . BUN 02/16/2020 14  8 - 23 mg/dL Final  . Creatinine, Ser 02/16/2020 0.79  0.44 - 1.00 mg/dL Final  . Calcium 02/16/2020 9.3  8.9 - 10.3 mg/dL Final  . Total Protein 02/16/2020 6.9  6.5 - 8.1 g/dL Final  . Albumin 02/16/2020 4.3  3.5 - 5.0 g/dL Final  . AST 02/16/2020 25  15 - 41 U/L Final  . ALT 02/16/2020 37  0 - 44 U/L Final  . Alkaline Phosphatase 02/16/2020 PENDING  38 - 126 U/L Incomplete  . Total Bilirubin 02/16/2020 1.2  0.3 - 1.2 mg/dL Final  . GFR, Estimated 02/16/2020 >60  >60 mL/min Final   Comment: (NOTE) Calculated using the CKD-EPI Creatinine Equation (2021)   . Anion gap 02/16/2020 9  5 - 15 Final   Performed at [facility], [address] [place], [place] [address]  . WBC 02/16/2020 5.0  4.0 - 10.5 K/uL Final  . RBC  02/16/2020 4.53  3.87 - 5.11 MIL/uL Final  . Hemoglobin 02/16/2020 14.3  12.0 - 15.0 g/dL Final  . HCT 02/16/2020 41.8  36 - 46 % Final  . MCV 02/16/2020 92.3  80.0 - 100.0 fL Final  . MCH 02/16/2020 31.6  26.0 - 34.0 pg Final  . MCHC 02/16/2020 34.2  30.0 - 36.0 g/dL Final  . RDW 02/16/2020 12.9  11.5 - 15.5 % Final  . Platelets 02/16/2020 160  150 - 400 K/uL Final  . nRBC 02/16/2020 0.0  0.0 - 0.2 % Final  . Neutrophils Relative % 02/16/2020 64  % Final  . Neutro Abs 02/16/2020 3.2  1.7 - 7.7 K/uL Final  . Lymphocytes Relative 02/16/2020 21  % Final  . Lymphs Abs 02/16/2020 1.1  0.7 - 4.0 K/uL Final  . Monocytes Relative 02/16/2020 11  % Final  . Monocytes Absolute 02/16/2020 0.6  0.1 - 1.0 K/uL Final  . Eosinophils Relative 02/16/2020 3  % Final  . Eosinophils Absolute 02/16/2020 0.2  0.0 - 0.5 K/uL Final  . Basophils Relative 02/16/2020 1  % Final  . Basophils Absolute 02/16/2020 0.0  0.0 - 0.1 K/uL Final  . Immature Granulocytes 02/16/2020 0  % Final  . Abs Immature Granulocytes 02/16/2020 0.01  0.00 - 0.07 K/uL Final   Performed at [facility], [address] [place],  [place] [address]    Assessment:  Stephanie Sanders is a 74 y.o. female with multi-focal triple negative clinical stage T1cN1Mx right breast cancers/p biopsy on 05/28/2017. Pathologyrevealed two grade II invasive mammary carcinomas of no special type (4 cm from the nipple and 5 cm from the nipple). Tumors were in close proximity. Lymph node biopsy confirmed metastatic carcinoma of breast origin. Tumor is ER negative (<1%), PR negative and Her 2/neu 2+ by IHC. [facility] genetic testingon 07/21/2017 revealed a variant of uncertain significance in RECQL4.  Right sided mammogram and ultrasound on 05/21/2017 revealed 2 highly suspicious masses in the right breast at the 10:30 position 4 cm from nipple (1.8 x 1.2 x 1.1 cm) and at the 10:30 position 5 cm from nipple (1.1 x 0.9 x 0.9 cm). There was a  suspicious 2.7 x 1 x 2.2 cm lymph node in the right axilla with asymmetric nodular cortical thickening.  Ultrasound guided biopsyof the 2 masses and lymph node on 05/28/2017 revealed grade II invasive mammary carcinoma of no special type (4 cm from the nipple and 5 cm from the nipple). Lymph node biopsy revealed metastatic carcinoma of breast origin. Tumor is ER negative (<1%), PR negative and Her 2/neu 2+ by IHC. FISH was negative.   CA 27.29has been followed: 16.5 on 06/04/2017, 14.3 on 05/21/2018, 20.3 on 06/18/2018, 27.5 on 11/16/2018, 14.0 on 03/22/2019, 19.2 on 08/17/2019, and 19.4 on 02/16/2020. CA15-3was 17.6 on 06/04/2017.  [place] 06/09/2017 revealed an EF of 60-65%. She has enrolled on the UPBEAT clinical trial.  She received 4 cycles of AC(06/23/2017 - 09/08/2017) with Margarette Canada support. She received 12 weeks of Taxol (09/29/2017 - 01/05/2018).  Right partial mastectomyand sentinel lymph node biopsy on 02/01/2018 revealed a 10 mm grade II invasive carcinoma of no special type. The tumor bed encompassed the biopsy sites, with foci of residual carcinoma at both biopsy sites and also involving the tissue between the biopsy sites. Foci of residual carcinoma range from 1 to 10 mm.There was no DCIS. Distance from closest margin was 1 mm. Four of 4 lymph nodes were negative. Pathologic stage was ypT1b (m) ypN0 (sn).  She received breast radiationfrom 03/10/2018 - 05/07/2018.  She received 8 cycles ofXeloda(07/06/2018 - 12/29/2018).  Bilateral mammogram and right unilateral ultrasound on 01/27/2019 revealed no findings to explain the palpable areas of concern. There was no evidence of malignancy in either breast.  Bilateral breast MRI on 04/12/2019 showed no evidence of malignancy in either breast. The right breast breast showed posttreatment changes.  Bilateral diagnostic mammogram on 02/02/2020 revealed no evidence of malignancy.  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 Normal labs on 11/24/2017 included: B12, folate, TSH, and copper.  Bone densityon 07/01/2018 was normal witha T score of -0.8 and the RIGHTfemoral neck.  [facility] with with fever, cough and generalized weakness. Nasal swab was + forinfluenza A. She completed a course of Tamiflu.  Symptomatically, she feels "good."  She denies any breast concerns.  Her stools as "flat." She has had trouble scheduling a colonoscopy because of transportation issues.  Plan: 1.   Labs today: CBC with diff, CMP, CA27.29 2.Multifocal triple negative RIGHT breast cancer She is s/p neoadjuvant chemotherapy.             4 cycles of AC and 12 weeks of Taxol (last 01/05/2018). She is s/p right partial mastectomy and SLN biopsy (02/01/2018). Sheis s/p 8 cycles of Xeloda (last 12/29/2018).  Bilateral diagnostic mammogram on 02/02/2020 revealed no evidence of malignancy.   Bilateral breast MRI on 04/12/2019 showed no evidence of malignancy.   Discuss plan for alternating mammogram and breast MRI every 6 months Symptomatically, she is doing well.   Exam reveals no evidence of recurrent disease.Marland Kitchen CA 27.29 is normal.   Continue surveillance. 3.Right breast/axillary tenderness She continues to experience slight tenderness in her axilla.  Continue to monitor 4.   Change in stool caliber  She notes "flat" stools.  She denies any melena or hematochezia.    Encourage follow-up colonoscopy. 5.   Bilateral breast MRI 08/02/2020. 6.   RTC in 4 months for MD assessment and labs (CBC with diff, CMP, CA27.29).  I discussed the assessment and treatment plan with the patient.  The patient was provided an opportunity to ask questions and all were answered.  The patient agreed with the plan and demonstrated an understanding of the instructions.  The patient was advised to call back if the symptoms worsen or if the  condition fails to improve as anticipated.  I provided 15 minutes of face-to-face time during this this encounter and > 50% was spent counseling as documented under my assessment and plan.  An additional 6 minutes were spent reviewing her chart (Epic and Care Everywhere) including notes, labs, and imaging studies.    Lequita Asal, MD, PhD    02/16/2020, 10:50 AM  I, Mirian Mo Tufford, am acting as a Education administrator for Lequita Asal, MD.  I, Why Mike Gip, MD, have reviewed the above documentation for accuracy and completeness, and I agree with the above.

## 2020-02-16 ENCOUNTER — Encounter: Payer: Self-pay | Admitting: Hematology and Oncology

## 2020-02-16 ENCOUNTER — Inpatient Hospital Stay: Payer: Medicare Other | Attending: Hematology and Oncology | Admitting: Hematology and Oncology

## 2020-02-16 ENCOUNTER — Other Ambulatory Visit: Payer: Self-pay

## 2020-02-16 ENCOUNTER — Inpatient Hospital Stay: Payer: Medicare Other

## 2020-02-16 VITALS — BP 133/58 | HR 66 | Temp 97.1°F | Wt 153.6 lb

## 2020-02-16 DIAGNOSIS — Z8701 Personal history of pneumonia (recurrent): Secondary | ICD-10-CM | POA: Insufficient documentation

## 2020-02-16 DIAGNOSIS — Z9011 Acquired absence of right breast and nipple: Secondary | ICD-10-CM | POA: Insufficient documentation

## 2020-02-16 DIAGNOSIS — Z853 Personal history of malignant neoplasm of breast: Secondary | ICD-10-CM | POA: Insufficient documentation

## 2020-02-16 DIAGNOSIS — Z923 Personal history of irradiation: Secondary | ICD-10-CM | POA: Diagnosis not present

## 2020-02-16 DIAGNOSIS — Z87891 Personal history of nicotine dependence: Secondary | ICD-10-CM | POA: Insufficient documentation

## 2020-02-16 DIAGNOSIS — R195 Other fecal abnormalities: Secondary | ICD-10-CM | POA: Diagnosis not present

## 2020-02-16 DIAGNOSIS — Z9221 Personal history of antineoplastic chemotherapy: Secondary | ICD-10-CM | POA: Diagnosis not present

## 2020-02-16 DIAGNOSIS — C50911 Malignant neoplasm of unspecified site of right female breast: Secondary | ICD-10-CM | POA: Diagnosis not present

## 2020-02-16 DIAGNOSIS — N6459 Other signs and symptoms in breast: Secondary | ICD-10-CM | POA: Insufficient documentation

## 2020-02-16 DIAGNOSIS — Z171 Estrogen receptor negative status [ER-]: Secondary | ICD-10-CM | POA: Diagnosis not present

## 2020-02-16 LAB — COMPREHENSIVE METABOLIC PANEL
ALT: 37 U/L (ref 0–44)
AST: 25 U/L (ref 15–41)
Albumin: 4.3 g/dL (ref 3.5–5.0)
Alkaline Phosphatase: 48 U/L (ref 38–126)
Anion gap: 9 (ref 5–15)
BUN: 14 mg/dL (ref 8–23)
CO2: 27 mmol/L (ref 22–32)
Calcium: 9.3 mg/dL (ref 8.9–10.3)
Chloride: 100 mmol/L (ref 98–111)
Creatinine, Ser: 0.79 mg/dL (ref 0.44–1.00)
GFR, Estimated: >60 mL/min (ref >60)
Glucose, Bld: 144 mg/dL — ABNORMAL HIGH (ref 70–99)
Potassium: 4.2 mmol/L (ref 3.5–5.1)
Sodium: 136 mmol/L (ref 135–145)
Total Bilirubin: 1.2 mg/dL (ref 0.3–1.2)
Total Protein: 6.9 g/dL (ref 6.5–8.1)

## 2020-02-16 LAB — CBC WITH DIFFERENTIAL/PLATELET
Abs Immature Granulocytes: 0.01 10*3/uL (ref 0.00–0.07)
Basophils Absolute: 0 10*3/uL (ref 0.0–0.1)
Basophils Relative: 1 %
Eosinophils Absolute: 0.2 10*3/uL (ref 0.0–0.5)
Eosinophils Relative: 3 %
HCT: 41.8 % (ref 36.0–46.0)
Hemoglobin: 14.3 g/dL (ref 12.0–15.0)
Immature Granulocytes: 0 %
Lymphocytes Relative: 21 %
Lymphs Abs: 1.1 10*3/uL (ref 0.7–4.0)
MCH: 31.6 pg (ref 26.0–34.0)
MCHC: 34.2 g/dL (ref 30.0–36.0)
MCV: 92.3 fL (ref 80.0–100.0)
Monocytes Absolute: 0.6 10*3/uL (ref 0.1–1.0)
Monocytes Relative: 11 %
Neutro Abs: 3.2 10*3/uL (ref 1.7–7.7)
Neutrophils Relative %: 64 %
Platelets: 160 10*3/uL (ref 150–400)
RBC: 4.53 MIL/uL (ref 3.87–5.11)
RDW: 12.9 % (ref 11.5–15.5)
WBC: 5 10*3/uL (ref 4.0–10.5)
nRBC: 0 % (ref 0.0–0.2)

## 2020-02-16 NOTE — Progress Notes (Signed)
No new changes noted today 

## 2020-02-17 LAB — CANCER ANTIGEN 27.29: CA 27.29: 19.4 U/mL (ref 0.0–38.6)

## 2020-02-25 DIAGNOSIS — R195 Other fecal abnormalities: Secondary | ICD-10-CM | POA: Insufficient documentation

## 2020-05-17 ENCOUNTER — Ambulatory Visit
Admission: RE | Admit: 2020-05-17 | Discharge: 2020-05-17 | Disposition: A | Discharge status: Home / Self Care | Payer: Medicare Other | Source: Ambulatory Visit | Attending: Radiation Oncology | Admitting: Radiation Oncology

## 2020-05-17 ENCOUNTER — Encounter: Payer: Self-pay | Admitting: Radiation Oncology

## 2020-05-17 ENCOUNTER — Other Ambulatory Visit: Payer: Self-pay

## 2020-05-17 DIAGNOSIS — C50911 Malignant neoplasm of unspecified site of right female breast: Secondary | ICD-10-CM

## 2020-05-17 DIAGNOSIS — Z853 Personal history of malignant neoplasm of breast: Secondary | ICD-10-CM | POA: Insufficient documentation

## 2020-05-17 DIAGNOSIS — Z923 Personal history of irradiation: Secondary | ICD-10-CM | POA: Diagnosis not present

## 2020-05-17 NOTE — Progress Notes (Signed)
Radiation Oncology Follow up Note  Name: Stephanie Sanders   Date:   05/17/2020 MRN:  [id number] DOB: February 04, 1946    This 75 y.o. female presents to the clinic today for 2-year follow-up status post whole breast radiation to her right breast for stage IIb triple negative invasive mammary carcinoma.  REFERRING PROVIDER: Baxter Hire, MD  HPI: Patient is a 75 year old female now at 2 years having completed whole breast radiation to her right breast for a triple negative invasive mammary carcinoma seen today in routine follow-up she is doing well.  She specifically denies breast tenderness cough or bone pain.  She has retraction towards her scar making cosmetic result good to fair.  She is not on antiestrogen therapy based on the triple negative nature of her disease..  She had mammograms back in October which I have reviewed were BI-RADS 2 benign.  COMPLICATIONS OF TREATMENT: none  FOLLOW UP COMPLIANCE: keeps appointments   PHYSICAL EXAM:  BP (P) 130/70 (BP Location: Left Arm, Patient Position: Sitting)   Pulse (P) 66   Temp (!) (P) 97.3 F (36.3 C) (Tympanic)   Resp (P) 16   Wt (P) 150 lb 6.4 oz (68.2 kg)   BMI (P) 22.87 kg/m  Right breast has retraction of her lumpectomy scar towards the nipple based on the horizontal nature of his of her incision.  No dominant masses noted in either breast.  No axillary or supraclavicular adenopathy is identified.  Well-developed well-nourished patient in NAD. HEENT reveals PERLA, EOMI, discs not visualized.  Oral cavity is clear. No oral mucosal lesions are identified. Neck is clear without evidence of cervical or supraclavicular adenopathy. Lungs are clear to A&P. Cardiac examination is essentially unremarkable with regular rate and rhythm without murmur rub or thrill. Abdomen is benign with no organomegaly or masses noted. Motor sensory and DTR levels are equal and symmetric in the upper and lower extremities. Cranial nerves II through XII are grossly  intact. Proprioception is intact. No peripheral adenopathy or edema is identified. No motor or sensory levels are noted. Crude visual fields are within normal range.  RADIOLOGY RESULTS: Mammograms reviewed compatible with above-stated findings  PLAN: Present time she is now 2 years out with no evidence of disease.  I am pleased with her overall progress.  I have asked to see her back in 1 year for follow-up.  She is already scheduled for follow-up mammograms.  Patient knows to call with any concerns.  I would like to take this opportunity to thank you for allowing me to participate in the care of your patient.Noreene Filbert, MD

## 2020-06-14 NOTE — Progress Notes (Signed)
[facility]  [address] [place], [place] [address] Phone: [phone]  Fax: [phone]  Office Visit:  06/18/2020  Referring physician: Baxter Hire, MD  Chief Complaint: Stephanie Sanders is a 75 y.o. 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   HPI: The patient was last seen in the medical oncology clinic on 02/16/2020. At that time, she felt "good."  She denied any breast concerns. She described her stools as "flat." She had trouble scheduling a colonoscopy because of transportation issues. Hematocrit was 41.8, hemoglobin 14.3, platelets 160,000, WBC 5,000. CA27.29 was 19.4. We discussed continued surveillance. She was encouraged to schedule a colonoscopy.  The patient saw Dr. Baruch Gouty on 05/17/2020. She was doing well and denied breast tenderness, cough, or bone pain. Follow-up was planned for 1 year.  Bilateral breast MRI is scheduled for 08/03/2020.  During the interim, she has been "good." She denies fevers, sweats, and bumps. She feels that there is a knot in her breast but thinks it may just be scar tissue.  She had a colonoscopy on 05/15/2020 by Dr. Alice Reichert (no results on record). Per patient, a few polyps were removed but she does not need a colonoscopy again. Her bowel movements are almost back to normal. She has rare episodes of diarrhea.  The patient only got 3 hours of sleep last night. She stayed up late talking to her daughter and then her neighbor's dog kept her up.   Past Medical History:  Diagnosis Date  . Arthritis    knees  . Cancer ([place]) 04/2017   right breast  . Diabetes mellitus without complication ([place])   . Diabetic nephropathy ([place])   . Headache    migraines  . Heart murmur    ASYMPTOMATIC  . Hypertension   . Hypothyroidism   . Personal history of chemotherapy 2019   right breast cancer  . Personal history of radiation therapy   . Pneumonia   . Seborrheic  keratosis   . Vitamin D deficiency     Past Surgical History:  Procedure Laterality Date  . APPENDECTOMY    . AXILLARY LYMPH NODE DISSECTION Right 02/01/2018   Procedure: AXILLARY LYMPH NODE DISSECTION;  Surgeon: Herbert Pun, MD;  Location: [facility];  Service: General;  Laterality: Right;  . BREAST BIOPSY Right 05/28/2017   right breast bx 3 areas invasive mamm ca lymph node mets  . BREAST CYST ASPIRATION Bilateral    neg  . BREAST LUMPECTOMY Right 02/01/2018  . CATARACT EXTRACTION W/PHACO Right 07/05/2019   Procedure: CATARACT EXTRACTION PHACO AND INTRAOCULAR LENS PLACEMENT (IOC) RIGHT DIABETIC 10.78 00:55.5 ;  Surgeon: Birder Robson, MD;  Location: [place];  Service: Ophthalmology;  Laterality: Right;  Diabetic - oral meds  . CATARACT EXTRACTION W/PHACO Left 08/02/2019   Procedure: CATARACT EXTRACTION PHACO AND INTRAOCULAR LENS PLACEMENT (IOC) LEFT DIABETIC 6.73  00:40.2;  Surgeon: Birder Robson, MD;  Location: [facility];  Service: Ophthalmology;  Laterality: Left;  Diabetic - oral meds  . CHOLECYSTECTOMY    . COLONOSCOPY WITH PROPOFOL N/A 01/01/2015   Procedure: COLONOSCOPY WITH PROPOFOL;  Surgeon: Josefine Class, MD;  Location: [facility];  Service: Endoscopy;  Laterality: N/A;  . EYE SURGERY Left    eyelid  . PARTIAL MASTECTOMY WITH NEEDLE LOCALIZATION Right 02/01/2018   Procedure: PARTIAL MASTECTOMY WITH NEEDLE LOCALIZATION;  Surgeon: Herbert Pun, MD;  Location: [facility];  Service: General;  Laterality: Right;  .  PORTACATH PLACEMENT Right 06/10/2017   Procedure: INSERTION PORT-A-CATH;  Surgeon: Herbert Pun, MD;  Location: [facility];  Service: General;  Laterality: Right;  . SENTINEL NODE BIOPSY Right 02/01/2018   Procedure: SENTINEL NODE BIOPSY;  Surgeon: Herbert Pun, MD;  Location: [facility];  Service: General;  Laterality: Right;  . STENT PLACEMENT ILIAC ([place] HX)     temporary placement s/p cholecystectomy   . TUBAL LIGATION      Family History  Problem Relation Age of Onset  . Parkinson's disease Mother   . Cancer Maternal Aunt   . Cancer Father   . Lung cancer Father   . Breast cancer Neg Hx     Social History:  reports that she quit smoking about 48 years ago. Her smoking use included cigarettes. She has a 20.00 pack-year smoking history. She has never used smokeless tobacco. She reports that she does not drink alcohol and does not use drugs. has 3 children (daughters: age 79 and 36; son Jaquelyn Bitter): age 66). She is retired. She worked for the [employer]. Her husband has dementia. She lives in [place]. The patient is accompanied by [facility] today.  Allergies:  Allergies  Allergen Reactions  . Fiorinal [Butalbital-Aspirin-Caffeine] Nausea And Vomiting and Other (See Comments)    SEVERE HEADACHE  . Sulfa Antibiotics Other (See Comments)    Burning esophagus and stomach  . Tramadol Nausea And Vomiting  . Other Rash    STERI-STRIPS-RASH    Current Medications: Current Outpatient Medications  Medication Sig Dispense Refill  . atorvastatin (LIPITOR) 10 MG tablet Take 10 mg by mouth every evening.     . Cholecalciferol 2000 UNITS CAPS Take 4,000 Units by mouth daily.    . Colesevelam HCl 3.75 g PACK Take by mouth.    . levothyroxine (SYNTHROID, LEVOTHROID) 50 MCG tablet Take 50 mcg by mouth daily before breakfast.    . loratadine (CLARITIN) 10 MG tablet Take 10 mg by mouth daily.    Marland Kitchen MAGNESIUM GLYCINATE PLUS PO Take 1 tablet by mouth at bedtime.     . metFORMIN (GLUCOPHAGE) 1000 MG tablet Take 1,000 mg by mouth 2 (two) times daily with a meal.    . Multiple Vitamin (MULTIVITAMIN) tablet Take 1 tablet by mouth daily.    . Omega-3 Fatty Acids (FISH OIL) 1000 MG CAPS Take 1,000 mg by mouth daily.    . propranolol (INDERAL) 10 MG tablet Take 10 mg by mouth 2 (two) times daily.    . quinapril (ACCUPRIL) 5 MG tablet Take 5 mg by mouth every morning.     . sodium bicarbonate 650 MG tablet  Take 1,300 mg by mouth at bedtime.    Marland Kitchen acetaminophen (TYLENOL) 500 MG tablet Take 1,000 mg by mouth every 6 (six) hours as needed for moderate pain or headache.  (Patient not taking: No sig reported)    . ascorbic acid (VITAMIN C) 1000 MG tablet Take by mouth. (Patient not taking: Reported on 06/15/2020)    . Lactobacillus (PROBIOTIC ACIDOPHILUS PO) Take 1 capsule by mouth daily.  (Patient not taking: No sig reported)    . Quercetin 250 MG TABS Take 500 mg by mouth in the morning and at bedtime. (Patient not taking: Reported on 06/15/2020)    . zinc gluconate 50 MG tablet Take 50 mg by mouth daily. (Patient not taking: Reported on 06/15/2020)     No current facility-administered medications for this visit.   Facility-Administered Medications Ordered in Other Visits  Medication Dose Route Frequency Provider Last  Rate Last Admin  . sodium chloride flush (NS) 0.9 % injection 10 mL  10 mL Intravenous PRN Lequita Asal, MD   10 mL at 10/21/17 0841    Review of Systems  Constitutional: Negative.  Negative for chills, diaphoresis, fever, malaise/fatigue and weight loss (stable).       Feels "good".  HENT: Negative.  Negative for congestion, ear discharge, ear pain, hearing loss, nosebleeds, sinus pain, sore throat and tinnitus.   Eyes: Negative.  Negative for blurred vision.  Respiratory: Negative.  Negative for cough, hemoptysis, sputum production and shortness of breath.   Cardiovascular: Negative.  Negative for chest pain, palpitations and leg swelling.  Gastrointestinal: Positive for diarrhea (rare episodes). Negative for abdominal pain, blood in stool, constipation, heartburn, melena, nausea and vomiting.       Bowels are almost back to normal.  Genitourinary: Negative.  Negative for dysuria, frequency, hematuria and urgency.  Musculoskeletal: Negative.  Negative for back pain, joint pain, myalgias and neck pain.  Skin: Negative.  Negative for itching and rash.  Neurological: Negative.   Negative for dizziness, tingling, sensory change, weakness and headaches.  Endo/Heme/Allergies: Negative.  Does not bruise/bleed easily.  Psychiatric/Behavioral: Negative.  Negative for depression and memory loss. The patient is not nervous/anxious and does not have insomnia.   All other systems reviewed and are negative.  Performance status (ECOG): 0  Vital Signs Blood pressure (!) 145/66, pulse 79, temperature (!) 97.1 F (36.2 C), temperature source Tympanic, weight 153 lb 12.3 oz (69.7 kg), SpO2 100 %.  Physical Exam Constitutional:      General: She is not in acute distress.    Appearance: She is well-developed. She is not diaphoretic.  HENT:     Head: Normocephalic and atraumatic.     Mouth/Throat:     Mouth: Mucous membranes are moist.     Pharynx: Oropharynx is clear. No oropharyngeal exudate.  Eyes:     General: No scleral icterus.    Extraocular Movements: Extraocular movements intact.     Pupils: Pupils are equal, round, and reactive to light.     Comments: Eyes are injected.  Neck:     Vascular: No JVD.  Cardiovascular:     Rate and Rhythm: Normal rate and regular rhythm.     Heart sounds: Normal heart sounds. No murmur heard. No friction rub. No gallop.   Pulmonary:     Effort: Pulmonary effort is normal. No respiratory distress.     Breath sounds: Normal breath sounds. No wheezing or rales.  Chest:     Chest wall: No tenderness.  Breasts:     Right: Swelling (s/p radiation), skin change (scarring at 10-11:30, stable) and tenderness present. No bleeding, mass, axillary adenopathy or supraclavicular adenopathy.     Left: Skin change (fibrocystic changes superiorly and at 9 o clock) and tenderness (superiorly) present. No swelling, bleeding, mass, axillary adenopathy or supraclavicular adenopathy.    Abdominal:     General: Bowel sounds are normal. There is no distension.     Palpations: Abdomen is soft. There is no hepatomegaly, splenomegaly or mass.      Tenderness: There is no abdominal tenderness. There is no guarding or rebound.  Musculoskeletal:        General: Normal range of motion.     Cervical back: Normal range of motion and neck supple.     Right lower leg: No edema.     Left lower leg: No edema.  Lymphadenopathy:     Head:  Right side of head: No preauricular, posterior auricular or occipital adenopathy.     Left side of head: No preauricular, posterior auricular or occipital adenopathy.     Cervical: No cervical adenopathy.     Upper Body:     Right upper body: No supraclavicular or axillary adenopathy.     Left upper body: No supraclavicular or axillary adenopathy.     Lower Body: No right inguinal adenopathy. No left inguinal adenopathy.  Skin:    General: Skin is warm and dry.     Coloration: Skin is not pale.     Findings: No erythema or rash.  Neurological:     Mental Status: She is alert and oriented to person, place, and time.  Psychiatric:        Behavior: Behavior normal.        Thought Content: Thought content normal.        Judgment: Judgment normal.    Appointment on 06/18/2020  Component Date Value Ref Range Status  . Sodium 06/18/2020 139  135 - 145 mmol/L Final  . Potassium 06/18/2020 4.1  3.5 - 5.1 mmol/L Final  . Chloride 06/18/2020 100  98 - 111 mmol/L Final  . CO2 06/18/2020 26  22 - 32 mmol/L Final  . Glucose, Bld 06/18/2020 137* 70 - 99 mg/dL Final   Glucose reference range applies only to samples taken after fasting for at least 8 hours.  . BUN 06/18/2020 19  8 - 23 mg/dL Final  . Creatinine, Ser 06/18/2020 0.96  0.44 - 1.00 mg/dL Final  . Calcium 06/18/2020 9.8  8.9 - 10.3 mg/dL Final  . Total Protein 06/18/2020 6.8  6.5 - 8.1 g/dL Final  . Albumin 06/18/2020 4.4  3.5 - 5.0 g/dL Final  . AST 06/18/2020 24  15 - 41 U/L Final  . ALT 06/18/2020 26  0 - 44 U/L Final  . Alkaline Phosphatase 06/18/2020 49  38 - 126 U/L Final  . Total Bilirubin 06/18/2020 1.0  0.3 - 1.2 mg/dL Final  . GFR,  Estimated 06/18/2020 >60  >60 mL/min Final   Comment: (NOTE) Calculated using the CKD-EPI Creatinine Equation (2021)   . Anion gap 06/18/2020 13  5 - 15 Final   Performed at [facility], [address] [place], [place] [address]  . WBC 06/18/2020 6.4  4.0 - 10.5 K/uL Final  . RBC 06/18/2020 4.43  3.87 - 5.11 MIL/uL Final  . Hemoglobin 06/18/2020 14.0  12.0 - 15.0 g/dL Final  . HCT 06/18/2020 41.3  36.0 - 46.0 % Final  . MCV 06/18/2020 93.2  80.0 - 100.0 fL Final  . MCH 06/18/2020 31.6  26.0 - 34.0 pg Final  . MCHC 06/18/2020 33.9  30.0 - 36.0 g/dL Final  . RDW 06/18/2020 12.8  11.5 - 15.5 % Final  . Platelets 06/18/2020 162  150 - 400 K/uL Final  . nRBC 06/18/2020 0.0  0.0 - 0.2 % Final  . Neutrophils Relative % 06/18/2020 65  % Final  . Neutro Abs 06/18/2020 4.2  1.7 - 7.7 K/uL Final  . Lymphocytes Relative 06/18/2020 21  % Final  . Lymphs Abs 06/18/2020 1.3  0.7 - 4.0 K/uL Final  . Monocytes Relative 06/18/2020 10  % Final  . Monocytes Absolute 06/18/2020 0.6  0.1 - 1.0 K/uL Final  . Eosinophils Relative 06/18/2020 3  % Final  . Eosinophils Absolute 06/18/2020 0.2  0.0 - 0.5 K/uL Final  . Basophils Relative 06/18/2020 1  % Final  .  Basophils Absolute 06/18/2020 0.0  0.0 - 0.1 K/uL Final  . Immature Granulocytes 06/18/2020 0  % Final  . Abs Immature Granulocytes 06/18/2020 0.02  0.00 - 0.07 K/uL Final   Performed at [facility], [address] [place], [place] [address]    Assessment:  JASNOOR TRUSSELL is a 75 y.o. female with multi-focal triple negative clinical stage T1cN1Mx right breast cancers/p biopsy on 05/28/2017. Pathologyrevealed two grade II invasive mammary carcinomas of no special type (4 cm from the nipple and 5 cm from the nipple). Tumors were in close proximity. Lymph node biopsy confirmed metastatic carcinoma of breast origin. Tumor is ER negative (<1%), PR negative and Her 2/neu 2+ by IHC. [facility] genetic testingon 07/21/2017  revealed a variant of uncertain significance in RECQL4.  Right sided mammogram and ultrasound on 05/21/2017 revealed 2 highly suspicious masses in the right breast at the 10:30 position 4 cm from nipple (1.8 x 1.2 x 1.1 cm) and at the 10:30 position 5 cm from nipple (1.1 x 0.9 x 0.9 cm). There was a suspicious 2.7 x 1 x 2.2 cm lymph node in the right axilla with asymmetric nodular cortical thickening.  Ultrasound guided biopsyof the 2 masses and lymph node on 05/28/2017 revealed grade II invasive mammary carcinoma of no special type (4 cm from the nipple and 5 cm from the nipple). Lymph node biopsy revealed metastatic carcinoma of breast origin. Tumor is ER negative (<1%), PR negative and Her 2/neu 2+ by IHC. FISH was negative.   CA 27.29has been followed: 16.5 on 06/04/2017, 14.3 on 05/21/2018, 20.3 on 06/18/2018, 27.5 on 11/16/2018, 14.0 on 03/22/2019, 19.2 on 08/17/2019, 19.4 on 02/16/2020, and 9.6 on 06/18/2020. CA15-3was 17.6 on 06/04/2017.  [place] 06/09/2017 revealed an EF of 60-65%. She has enrolled on the UPBEAT clinical trial.  She received 4 cycles of AC(06/23/2017 - 09/08/2017) with Margarette [place] support. She received 12 weeks of Taxol (09/29/2017 - 01/05/2018).  Right partial mastectomyand sentinel lymph node biopsy on 02/01/2018 revealed a 10 mm grade II invasive carcinoma of no special type. The tumor bed encompassed the biopsy sites, with foci of residual carcinoma at both biopsy sites and also involving the tissue between the biopsy sites. Foci of residual carcinoma range from 1 to 10 mm.There was no DCIS. Distance from closest margin was 1 mm. Four of 4 lymph nodes were negative. Pathologic stage was ypT1b (m) ypN0 (sn).  She received breast radiationfrom 03/10/2018 - 05/07/2018.  She received 8 cycles ofXeloda(07/06/2018 - 12/29/2018).  Bilateral mammogram and right unilateral ultrasound on 01/27/2019 revealed no findings to explain the palpable areas of  concern. There was no evidence of malignancy in either breast.  Bilateral breast MRI on 04/12/2019 showed no evidence of malignancy in either breast. The right breast breast showed posttreatment changes.  Bilateral diagnostic mammogram on 02/02/2020 revealed no evidence of malignancy.  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 Normal labs on 11/24/2017 included: B12, folate, TSH, and copper.  Bone densityon 07/01/2018 was normal witha T score of -0.8 and the RIGHTfemoral neck.  Colonoscopy on 05/15/2020 revealed a few polyps per patient.  She does not need a colonoscopy again.  [facility] with with fever, cough and generalized weakness. Nasal swab was + forinfluenza A. She completed a course of Tamiflu.  Symptomatically, she feels "good".  She denies any breast concerns.  Exam is stable.  Plan: 1.   Labs today: CBC with diff, CMP, CA27.29. 2.Multifocal triple negative  RIGHT breast cancer She is s/p neoadjuvant chemotherapy.             4 cycles of AC and 12 weeks of Taxol (last 01/05/2018). She is s/p right partial mastectomy and SLN biopsy (02/01/2018). Sheis s/p 8 cycles of Xeloda (last 12/29/2018).             Bilateral diagnostic mammogram on 02/02/2020 revealed no evidence of malignancy.   Bilateral breast MRI on 04/12/2019 showed no evidence of malignancy.   Bilateral breast MRI is scheduled for 08/03/2020.  Continue alternating mammogram and breast MRI every 6 months Symptomatically, she is doing well. Exam reveals reveals no evidence of recurrent disease. CA 27.29 is 9.6 (normal).   Continue every 51-month surveillance. 3.Change in stool caliber  She noted "flat" stools.  She denied any melena or hematochezia.    Patient underwent colonoscopy on 05/15/2020.   No report is available.   Per patient, she had a few polyps removed and does not require a follow-up colonoscopy. 4.   RTC in 4 months for  MD assess, labs (CBC with diff, CMP, CA27.29), and review interval breast MRI.  I discussed the assessment and treatment plan with the patient.  The patient was provided an opportunity to ask questions and all were answered.  The patient agreed with the plan and demonstrated an understanding of the instructions.  The patient was advised to call back if the symptoms worsen or if the condition fails to improve as anticipated.   Lequita Asal, MD, PhD    06/18/2020, 12:00 PM  I, Mirian Mo Tufford, am acting as a Education administrator for Lequita Asal, MD.  I, [place] Mike Gip, MD, have reviewed the above documentation for accuracy and completeness, and I agree with the above.

## 2020-06-15 ENCOUNTER — Encounter: Payer: Self-pay | Admitting: Hematology and Oncology

## 2020-06-18 ENCOUNTER — Other Ambulatory Visit: Payer: Self-pay

## 2020-06-18 ENCOUNTER — Inpatient Hospital Stay: Payer: Medicare Other | Attending: Hematology and Oncology | Admitting: Hematology and Oncology

## 2020-06-18 ENCOUNTER — Inpatient Hospital Stay: Payer: Medicare Other

## 2020-06-18 VITALS — BP 145/66 | HR 79 | Temp 97.1°F | Wt 153.8 lb

## 2020-06-18 DIAGNOSIS — Z801 Family history of malignant neoplasm of trachea, bronchus and lung: Secondary | ICD-10-CM | POA: Insufficient documentation

## 2020-06-18 DIAGNOSIS — Z87891 Personal history of nicotine dependence: Secondary | ICD-10-CM | POA: Insufficient documentation

## 2020-06-18 DIAGNOSIS — Z171 Estrogen receptor negative status [ER-]: Secondary | ICD-10-CM | POA: Diagnosis not present

## 2020-06-18 DIAGNOSIS — C50911 Malignant neoplasm of unspecified site of right female breast: Secondary | ICD-10-CM

## 2020-06-18 DIAGNOSIS — Z809 Family history of malignant neoplasm, unspecified: Secondary | ICD-10-CM | POA: Insufficient documentation

## 2020-06-18 DIAGNOSIS — R195 Other fecal abnormalities: Secondary | ICD-10-CM | POA: Diagnosis not present

## 2020-06-18 DIAGNOSIS — Z853 Personal history of malignant neoplasm of breast: Secondary | ICD-10-CM | POA: Insufficient documentation

## 2020-06-18 DIAGNOSIS — Z86010 Personal history of colonic polyps: Secondary | ICD-10-CM | POA: Insufficient documentation

## 2020-06-18 DIAGNOSIS — R194 Change in bowel habit: Secondary | ICD-10-CM | POA: Insufficient documentation

## 2020-06-18 LAB — COMPREHENSIVE METABOLIC PANEL
ALT: 26 U/L (ref 0–44)
AST: 24 U/L (ref 15–41)
Albumin: 4.4 g/dL (ref 3.5–5.0)
Alkaline Phosphatase: 49 U/L (ref 38–126)
Anion gap: 13 (ref 5–15)
BUN: 19 mg/dL (ref 8–23)
CO2: 26 mmol/L (ref 22–32)
Calcium: 9.8 mg/dL (ref 8.9–10.3)
Chloride: 100 mmol/L (ref 98–111)
Creatinine, Ser: 0.96 mg/dL (ref 0.44–1.00)
GFR, Estimated: >60 mL/min (ref >60)
Glucose, Bld: 137 mg/dL — ABNORMAL HIGH (ref 70–99)
Potassium: 4.1 mmol/L (ref 3.5–5.1)
Sodium: 139 mmol/L (ref 135–145)
Total Bilirubin: 1 mg/dL (ref 0.3–1.2)
Total Protein: 6.8 g/dL (ref 6.5–8.1)

## 2020-06-18 LAB — CBC WITH DIFFERENTIAL/PLATELET
Abs Immature Granulocytes: 0.02 10*3/uL (ref 0.00–0.07)
Basophils Absolute: 0 10*3/uL (ref 0.0–0.1)
Basophils Relative: 1 %
Eosinophils Absolute: 0.2 10*3/uL (ref 0.0–0.5)
Eosinophils Relative: 3 %
HCT: 41.3 % (ref 36.0–46.0)
Hemoglobin: 14 g/dL (ref 12.0–15.0)
Immature Granulocytes: 0 %
Lymphocytes Relative: 21 %
Lymphs Abs: 1.3 10*3/uL (ref 0.7–4.0)
MCH: 31.6 pg (ref 26.0–34.0)
MCHC: 33.9 g/dL (ref 30.0–36.0)
MCV: 93.2 fL (ref 80.0–100.0)
Monocytes Absolute: 0.6 10*3/uL (ref 0.1–1.0)
Monocytes Relative: 10 %
Neutro Abs: 4.2 10*3/uL (ref 1.7–7.7)
Neutrophils Relative %: 65 %
Platelets: 162 10*3/uL (ref 150–400)
RBC: 4.43 MIL/uL (ref 3.87–5.11)
RDW: 12.8 % (ref 11.5–15.5)
WBC: 6.4 10*3/uL (ref 4.0–10.5)
nRBC: 0 % (ref 0.0–0.2)

## 2020-06-19 LAB — CANCER ANTIGEN 27.29: CA 27.29: 9.6 U/mL (ref 0.0–38.6)

## 2020-06-29 ENCOUNTER — Telehealth: Payer: Self-pay | Admitting: Emergency Medicine

## 2020-06-29 NOTE — Telephone Encounter (Signed)
[id number], UPBEAT (Understanding and Predicting Breast Cancer Events after Treatment)  06/29/20  Called patient to conduct the year 3 phone visit for the UPBEAT study.  Patient did not answer; left voicemail requesting return call.  Clabe Seal Clinical Research Coordinator I  06/29/20  3:45 PM

## 2020-07-11 ENCOUNTER — Telehealth: Payer: Self-pay | Admitting: Emergency Medicine

## 2020-07-11 NOTE — Telephone Encounter (Signed)
WF [id number], UPBEAT (Understanding and Predicting Breast Cancer Events after Treatment)  07/11/20  Patient returned call.  Denied hospitalization since last research visit on 07/26/2019. Also denied MI, PCI, CABG, heart catheterization, CVA, or diagnosis of heart failure since last research visit.  Patient was informed of KCCG-12 questionnaire which will be sent to her email.  Confirmed email address and sent questionnaire.  Patient was informed that she will receive a call in one year.  She was asked to call if she has any questions and thanked for her time.  Clabe Seal Clinical Research Coordinator I  07/11/20  1:51 PM

## 2020-07-11 NOTE — Telephone Encounter (Signed)
WF [id number], UPBEAT (Understanding and Predicting Breast Cancer Events after Treatment)  07/11/20  Attempted to call patient for 2nd time for the year 3 phone visit for the UPBEAT study.  Patient did not answer; left voicemail requesting return call.  Clabe Seal Clinical Research Coordinator I  07/11/20  10:29 AM

## 2020-07-30 ENCOUNTER — Other Ambulatory Visit: Payer: Self-pay | Admitting: Student

## 2020-07-30 ENCOUNTER — Other Ambulatory Visit (HOSPITAL_COMMUNITY): Payer: Self-pay | Admitting: Student

## 2020-07-30 DIAGNOSIS — M7582 Other shoulder lesions, left shoulder: Secondary | ICD-10-CM

## 2020-07-30 DIAGNOSIS — C50411 Malignant neoplasm of upper-outer quadrant of right female breast: Secondary | ICD-10-CM

## 2020-08-03 ENCOUNTER — Ambulatory Visit
Admission: RE | Admit: 2020-08-03 | Discharge: 2020-08-03 | Disposition: A | Discharge status: Home / Self Care | Payer: Medicare Other | Source: Ambulatory Visit | Attending: Hematology and Oncology | Admitting: Hematology and Oncology

## 2020-08-03 ENCOUNTER — Other Ambulatory Visit: Payer: Self-pay

## 2020-08-03 ENCOUNTER — Ambulatory Visit
Admission: RE | Admit: 2020-08-03 | Discharge: 2020-08-03 | Disposition: A | Discharge status: Home / Self Care | Payer: Medicare Other | Source: Ambulatory Visit | Attending: Student | Admitting: Student

## 2020-08-03 DIAGNOSIS — Z171 Estrogen receptor negative status [ER-]: Secondary | ICD-10-CM | POA: Diagnosis present

## 2020-08-03 DIAGNOSIS — C50411 Malignant neoplasm of upper-outer quadrant of right female breast: Secondary | ICD-10-CM | POA: Diagnosis present

## 2020-08-03 DIAGNOSIS — C50911 Malignant neoplasm of unspecified site of right female breast: Secondary | ICD-10-CM

## 2020-08-03 DIAGNOSIS — M7582 Other shoulder lesions, left shoulder: Secondary | ICD-10-CM | POA: Diagnosis present

## 2020-08-03 MED ORDER — GADOBUTROL 1 MMOL/ML IV SOLN
7.0000 mL | Freq: Once | INTRAVENOUS | Status: AC | PRN
  Administered 2020-08-03: 7 mL via INTRAVENOUS

## 2020-09-12 ENCOUNTER — Telehealth: Payer: Self-pay | Admitting: Oncology

## 2020-09-12 NOTE — Telephone Encounter (Signed)
Pt left VM that she needs to reschedule her 6/28 appts for lab and MD. She would like a call back to reschedule the appts.Thank you

## 2020-10-16 ENCOUNTER — Other Ambulatory Visit: Payer: Medicare Other

## 2020-10-16 ENCOUNTER — Ambulatory Visit: Payer: Medicare Other | Admitting: Oncology

## 2020-10-17 ENCOUNTER — Encounter: Payer: Self-pay | Admitting: Nurse Practitioner

## 2020-10-17 ENCOUNTER — Inpatient Hospital Stay (HOSPITAL_BASED_OUTPATIENT_CLINIC_OR_DEPARTMENT_OTHER): Payer: Medicare Other | Admitting: Nurse Practitioner

## 2020-10-17 ENCOUNTER — Inpatient Hospital Stay: Payer: Medicare Other | Attending: Oncology

## 2020-10-17 VITALS — BP 126/70 | HR 67 | Temp 97.8°F | Resp 20 | Wt 154.6 lb

## 2020-10-17 DIAGNOSIS — Z9011 Acquired absence of right breast and nipple: Secondary | ICD-10-CM | POA: Insufficient documentation

## 2020-10-17 DIAGNOSIS — C50911 Malignant neoplasm of unspecified site of right female breast: Secondary | ICD-10-CM

## 2020-10-17 DIAGNOSIS — E559 Vitamin D deficiency, unspecified: Secondary | ICD-10-CM | POA: Insufficient documentation

## 2020-10-17 DIAGNOSIS — Z809 Family history of malignant neoplasm, unspecified: Secondary | ICD-10-CM | POA: Insufficient documentation

## 2020-10-17 DIAGNOSIS — Z171 Estrogen receptor negative status [ER-]: Secondary | ICD-10-CM | POA: Diagnosis not present

## 2020-10-17 DIAGNOSIS — E785 Hyperlipidemia, unspecified: Secondary | ICD-10-CM | POA: Insufficient documentation

## 2020-10-17 DIAGNOSIS — Z87891 Personal history of nicotine dependence: Secondary | ICD-10-CM | POA: Diagnosis not present

## 2020-10-17 DIAGNOSIS — Z780 Asymptomatic menopausal state: Secondary | ICD-10-CM | POA: Diagnosis not present

## 2020-10-17 DIAGNOSIS — Z853 Personal history of malignant neoplasm of breast: Secondary | ICD-10-CM | POA: Insufficient documentation

## 2020-10-17 DIAGNOSIS — I10 Essential (primary) hypertension: Secondary | ICD-10-CM | POA: Insufficient documentation

## 2020-10-17 DIAGNOSIS — Z801 Family history of malignant neoplasm of trachea, bronchus and lung: Secondary | ICD-10-CM | POA: Insufficient documentation

## 2020-10-17 DIAGNOSIS — L821 Other seborrheic keratosis: Secondary | ICD-10-CM | POA: Insufficient documentation

## 2020-10-17 LAB — CBC WITH DIFFERENTIAL/PLATELET
Abs Immature Granulocytes: 0.01 10*3/uL (ref 0.00–0.07)
Basophils Absolute: 0 10*3/uL (ref 0.0–0.1)
Basophils Relative: 1 %
Eosinophils Absolute: 0.3 10*3/uL (ref 0.0–0.5)
Eosinophils Relative: 6 %
HCT: 41.3 % (ref 36.0–46.0)
Hemoglobin: 13.8 g/dL (ref 12.0–15.0)
Immature Granulocytes: 0 %
Lymphocytes Relative: 22 %
Lymphs Abs: 1.1 10*3/uL (ref 0.7–4.0)
MCH: 31.9 pg (ref 26.0–34.0)
MCHC: 33.4 g/dL (ref 30.0–36.0)
MCV: 95.6 fL (ref 80.0–100.0)
Monocytes Absolute: 0.5 10*3/uL (ref 0.1–1.0)
Monocytes Relative: 10 %
Neutro Abs: 3.1 10*3/uL (ref 1.7–7.7)
Neutrophils Relative %: 61 %
Platelets: 155 10*3/uL (ref 150–400)
RBC: 4.32 MIL/uL (ref 3.87–5.11)
RDW: 12.7 % (ref 11.5–15.5)
WBC: 4.9 10*3/uL (ref 4.0–10.5)
nRBC: 0 % (ref 0.0–0.2)

## 2020-10-17 LAB — COMPREHENSIVE METABOLIC PANEL
ALT: 32 U/L (ref 0–44)
AST: 27 U/L (ref 15–41)
Albumin: 4.2 g/dL (ref 3.5–5.0)
Alkaline Phosphatase: 43 U/L (ref 38–126)
Anion gap: 9 (ref 5–15)
BUN: 13 mg/dL (ref 8–23)
CO2: 25 mmol/L (ref 22–32)
Calcium: 9.5 mg/dL (ref 8.9–10.3)
Chloride: 101 mmol/L (ref 98–111)
Creatinine, Ser: 0.72 mg/dL (ref 0.44–1.00)
GFR, Estimated: >60 mL/min (ref >60)
Glucose, Bld: 171 mg/dL — ABNORMAL HIGH (ref 70–99)
Potassium: 4.1 mmol/L (ref 3.5–5.1)
Sodium: 135 mmol/L (ref 135–145)
Total Bilirubin: 1.1 mg/dL (ref 0.3–1.2)
Total Protein: 6.5 g/dL (ref 6.5–8.1)

## 2020-10-17 NOTE — Addendum Note (Signed)
Addended by: Verlon Au on: 10/17/2020 01:51 PM   Modules accepted: Orders

## 2020-10-17 NOTE — Progress Notes (Signed)
[facility]  [address] [place], [place] [address] Phone: [phone]  Fax: [phone]  Office Visit:  10/17/2020  Referring physician: Baxter Hire, MD  Chief Complaint: Stephanie Sanders is a 75 y.o. 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   History of Presenting Illness: Stephanie Sanders is a 75 y.o. 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  Pathology revealed two grade II invasive mammary carcinomas of no special type (4 cm from the nipple and 5 cm from the nipple).  Tumors were in close proximity.  Lymph node biopsy confirmed metastatic carcinoma of breast origin.  Tumor is ER negative (< 1%), PR negative and Her 2/neu 2+ by IHC.  [employer] genetic testing on 07/21/2017 revealed a variant of uncertain significance in RECQL4.   Right sided mammogram and ultrasound on 05/21/2017 revealed 2 highly suspicious masses in the right breast at the 10:30 position 4 cm from nipple (1.8 x 1.2 x 1.1 cm) and at the 10:30 position 5 cm from nipple (1.1 x 0.9 x 0.9 cm).  There was a suspicious 2.7 x 1 x 2.2 cm lymph node in the right axilla with asymmetric nodular cortical thickening.   Ultrasound guided biopsy of the 2 masses and lymph node on 05/28/2017 revealed grade II invasive mammary carcinoma of no special type (4 cm from the nipple and 5 cm from the nipple).  Lymph node biopsy revealed metastatic carcinoma of breast origin.  Tumor is ER negative (< 1%), PR negative and Her 2/neu 2+ by IHC.  FISH was negative.     CA 27.29 has been followed: 16.5 on 06/04/2017, 14.3 on 05/21/2018, 20.3 on 06/18/2018, 27.5 on 11/16/2018, 14.0 on 03/22/2019, 19.2 on 08/17/2019, 19.4 on 02/16/2020, and 9.6 on 06/18/2020.  CA15-3 was 17.6 on 06/04/2017.   Echo on 06/09/2017 revealed an EF of 60-65%.  She has enrolled on the UPBEAT clinical trial.   She  received 4 cycles of AC (06/23/2017 - 09/08/2017) with Udencya support.  She received 12 weeks of Taxol (09/29/2017 - 01/05/2018).   Right partial mastectomy and sentinel lymph node biopsy on 02/01/2018 revealed a 10 mm grade II invasive carcinoma of no special type.  The tumor bed encompassed the biopsy sites, with foci of residual  carcinoma at both biopsy sites and also involving the tissue between the  biopsy sites. Foci of residual carcinoma range from 1 to 10 mm.There was no DCIS.  Distance from closest margin was 1 mm.  Four of 4 lymph nodes were negative.  Pathologic stage was ypT1b (m) ypN0 (sn).   She received breast radiation from 03/10/2018 - 05/07/2018.  She received 8 cycles of Xeloda (07/06/2018 - 12/29/2018).   Bilateral mammogram and right unilateral ultrasound on 01/27/2019 revealed no findings to explain the palpable areas of concern. There was no evidence of malignancy in either breast.  Bilateral breast MRI on 04/12/2019 showed no evidence of malignancy in either breast. The right breast breast showed posttreatment changes.  Bilateral diagnostic mammogram on 02/02/2020 revealed no evidence of malignancy.   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  Normal labs on 11/24/2017 included:  B12, folate, TSH, and copper.   Bone density on 07/01/2018 was normal with a T score of -0.8 and the RIGHT femoral neck.  Colonoscopy on 05/15/2020 revealed a few polyps per patient.  She does not need a colonoscopy again.  She was admitted to [facility] from 06/28/2017 - 07/01/2017 with with fever, cough and generalized weakness.  Nasal swab was + for influenza A.  She completed a course of Tamiflu.   Interval History: Patient is 75 year old female who returns to clinic for follow-up.  She feels well.  No new lumps or bumps.Denies any neurologic complaints. Denies recent fevers or illnesses. Denies any easy bleeding or bruising. No melena or hematochezia. Reports good appetite and denies  weight loss. Denies chest pain. Denies any nausea, vomiting, constipation, or diarrhea. Denies urinary complaints. Patient offers no further specific complaints today.   Past Medical History:  Diagnosis Date   Arthritis    knees   Cancer ([place]) 04/2017   right breast   Diabetes mellitus without complication (HCC)    Diabetic nephropathy (HCC)    Headache    migraines   Heart murmur    ASYMPTOMATIC   Hypertension    Hypothyroidism    Personal history of chemotherapy 2019   right breast cancer   Personal history of radiation therapy    Pneumonia    Seborrheic keratosis    Vitamin D deficiency     Past Surgical History:  Procedure Laterality Date   APPENDECTOMY     AXILLARY LYMPH NODE DISSECTION Right 02/01/2018   Procedure: AXILLARY LYMPH NODE DISSECTION;  Surgeon: Herbert Pun, MD;  Location: [facility];  Service: General;  Laterality: Right;   BREAST BIOPSY Right 05/28/2017   right breast bx 3 areas invasive mamm ca lymph node mets   BREAST CYST ASPIRATION Bilateral    neg   BREAST LUMPECTOMY Right 02/01/2018   CATARACT EXTRACTION W/PHACO Right 07/05/2019   Procedure: CATARACT EXTRACTION PHACO AND INTRAOCULAR LENS PLACEMENT ([place]) RIGHT DIABETIC 10.78 00:55.5 ;  Surgeon: Birder Robson, MD;  Location: [place];  Service: Ophthalmology;  Laterality: Right;  Diabetic - oral meds   CATARACT EXTRACTION W/PHACO Left 08/02/2019   Procedure: CATARACT EXTRACTION PHACO AND INTRAOCULAR LENS PLACEMENT ([facility]) LEFT DIABETIC 6.73  00:40.2;  Surgeon: Birder Robson, MD;  Location: [facility];  Service: Ophthalmology;  Laterality: Left;  Diabetic - oral meds   CHOLECYSTECTOMY     COLONOSCOPY WITH PROPOFOL N/A 01/01/2015   Procedure: COLONOSCOPY WITH PROPOFOL;  Surgeon: Josefine Class, MD;  Location: [facility] END[facility]COPY;  Service: Endoscopy;  Laterality: N/A;   EYE SURGERY Left    eyelid   PARTIAL MASTECTOMY WITH NEEDLE LOCALIZATION Right 02/01/2018   Procedure:  PARTIAL MASTECTOMY WITH NEEDLE LOCALIZATION;  Surgeon: Herbert Pun, MD;  Location: [facility];  Service: General;  Laterality: Right;   PORTACATH PLACEMENT Right 06/10/2017   Procedure: INSERTION PORT-A-CATH;  Surgeon: Herbert Pun, MD;  Location: [facility];  Service: General;  Laterality: Right;   SENTINEL NODE BIOPSY Right 02/01/2018   Procedure: SENTINEL NODE BIOPSY;  Surgeon: Herbert Pun, MD;  Location: [facility];  Service: General;  Laterality: Right;   STENT PLACEMENT ILIAC ([place] HX)     temporary placement s/p cholecystectomy   TUBAL LIGATION      Family History  Problem Relation Age of Onset   Parkinson's disease Mother    Cancer Maternal Aunt    Cancer Father    Lung cancer Father    Breast cancer Neg Hx     Social History:  reports that she quit smoking about 48 years ago. Her smoking use included cigarettes. She has a 20.00 pack-year smoking history. She has never used smokeless tobacco. She reports that she does not drink alcohol and does  not use drugs. has 3 children (daughters: age 46 and 52; son Jaquelyn Bitter): age 8).  She is retired.  She worked for the [employer].  Her husband has dementia.  She lives in [place].   Allergies:  Allergies  Allergen Reactions   Fiorinal [Butalbital-Aspirin-Caffeine] Nausea And Vomiting and Other (See Comments)    SEVERE HEADACHE   Sulfa Antibiotics Other (See Comments)    Burning esophagus and stomach   Tramadol Nausea And Vomiting   Other Rash    STERI-STRIPS-RASH    Current Medications: Current Outpatient Medications  Medication Sig Dispense Refill   acetaminophen (TYLENOL) 500 MG tablet Take 1,000 mg by mouth every 6 (six) hours as needed for moderate pain or headache.  (Patient not taking: No sig reported)     ascorbic acid (VITAMIN C) 1000 MG tablet Take by mouth. (Patient not taking: Reported on 06/15/2020)     atorvastatin (LIPITOR) 10 MG tablet Take 10 mg by mouth every evening.      Cholecalciferol 2000  UNITS CAPS Take 4,000 Units by mouth daily.     Colesevelam HCl 3.75 g PACK Take by mouth.     Lactobacillus (PROBIOTIC ACIDOPHILUS PO) Take 1 capsule by mouth daily.  (Patient not taking: No sig reported)     levothyroxine (SYNTHROID, LEVOTHROID) 50 MCG tablet Take 50 mcg by mouth daily before breakfast.     loratadine (CLARITIN) 10 MG tablet Take 10 mg by mouth daily.     MAGNESIUM GLYCINATE PLUS PO Take 1 tablet by mouth at bedtime.      metFORMIN (GLUCOPHAGE) 1000 MG tablet Take 1,000 mg by mouth 2 (two) times daily with a meal.     Multiple Vitamin (MULTIVITAMIN) tablet Take 1 tablet by mouth daily.     Omega-3 Fatty Acids (FISH OIL) 1000 MG CAPS Take 1,000 mg by mouth daily.     propranolol (INDERAL) 10 MG tablet Take 10 mg by mouth 2 (two) times daily.     Quercetin 250 MG TABS Take 500 mg by mouth in the morning and at bedtime. (Patient not taking: Reported on 06/15/2020)     quinapril (ACCUPRIL) 5 MG tablet Take 5 mg by mouth every morning.      sodium bicarbonate 650 MG tablet Take 1,300 mg by mouth at bedtime.     zinc gluconate 50 MG tablet Take 50 mg by mouth daily. (Patient not taking: Reported on 06/15/2020)     No current facility-administered medications for this visit.   Facility-Administered Medications Ordered in Other Visits  Medication Dose Route Frequency Provider Last Rate Last Admin   sodium chloride flush (NS) 0.9 % injection 10 mL  10 mL Intravenous PRN Lequita Asal, MD   10 mL at 10/21/17 0841    Review of Systems  Constitutional:  Negative for chills, fever, malaise/fatigue and weight loss.  HENT:  Negative for hearing loss, nosebleeds, sore throat and tinnitus.   Eyes:  Negative for blurred vision and double vision.  Respiratory:  Negative for cough, hemoptysis, shortness of breath and wheezing.   Cardiovascular:  Negative for chest pain, palpitations and leg swelling.  Gastrointestinal:  Negative for abdominal pain, blood in stool, constipation,  diarrhea, melena, nausea and vomiting.  Genitourinary:  Negative for dysuria and urgency.  Musculoskeletal:  Negative for back pain, falls, joint pain and myalgias.  Skin:  Negative for itching and rash.  Neurological:  Negative for dizziness, tingling, sensory change, loss of consciousness, weakness and headaches.  Endo/Heme/Allergies:  Negative for environmental  allergies. Does not bruise/bleed easily.  Psychiatric/Behavioral:  Negative for depression. The patient is not nervous/anxious and does not have insomnia.   Performance status (ECOG): 0  Vital Signs Blood pressure 126/70, pulse 67, temperature 97.8 F (36.6 C), resp. rate 20, weight 154 lb 9.6 oz (70.1 kg), SpO2 100 %.  General: Well-developed, well-nourished, no acute distress. Eyes: Pink conjunctiva, anicteric sclera. Lungs: Clear to auscultation bilaterally.  No audible wheezing or coughing Heart: Regular rate and rhythm.  Abdomen: Soft, nontender, nondistended.  Musculoskeletal: No edema, cyanosis, or clubbing. Neuro: Alert, answering all questions appropriately. Cranial nerves grossly intact. Skin: No rashes or petechiae noted. Psych: Normal affect. Breast exam was performed in seated and lying down position. Patient is status post right lumpectomy with a well-healed surgical scar. No evidence of any palpable masses. Heterogenous breast tissue is likely benign. No evidence of axillary adenopathy. No evidence of any palpable masses or lumps in the left breast. No evidence of left axillary adenopathy    Appointment on 10/17/2020  Component Date Value Ref Range Status   Sodium 10/17/2020 135  135 - 145 mmol/L Final   Potassium 10/17/2020 4.1  3.5 - 5.1 mmol/L Final   Chloride 10/17/2020 101  98 - 111 mmol/L Final   CO2 10/17/2020 25  22 - 32 mmol/L Final   Glucose, Bld 10/17/2020 171 (A) 70 - 99 mg/dL Final   Glucose reference range applies only to samples taken after fasting for at least 8 hours.   BUN 10/17/2020 13  8  - 23 mg/dL Final   Creatinine, Ser 10/17/2020 0.72  0.44 - 1.00 mg/dL Final   Calcium 10/17/2020 9.5  8.9 - 10.3 mg/dL Final   Total Protein 10/17/2020 6.5  6.5 - 8.1 g/dL Final   Albumin 10/17/2020 4.2  3.5 - 5.0 g/dL Final   AST 10/17/2020 27  15 - 41 U/L Final   ALT 10/17/2020 32  0 - 44 U/L Final   Alkaline Phosphatase 10/17/2020 43  38 - 126 U/L Final   Total Bilirubin 10/17/2020 1.1  0.3 - 1.2 mg/dL Final   GFR, Estimated 10/17/2020 >60  >60 mL/min Final   Comment: (NOTE) Calculated using the CKD-EPI Creatinine Equation (2021)    Anion gap 10/17/2020 9  5 - 15 Final   Performed at [facility], [address] [place], [place] [address]   WBC 10/17/2020 4.9  4.0 - 10.5 K/uL Final   RBC 10/17/2020 4.32  3.87 - 5.11 MIL/uL Final   Hemoglobin 10/17/2020 13.8  12.0 - 15.0 g/dL Final   HCT 10/17/2020 41.3  36.0 - 46.0 % Final   MCV 10/17/2020 95.6  80.0 - 100.0 fL Final   MCH 10/17/2020 31.9  26.0 - 34.0 pg Final   MCHC 10/17/2020 33.4  30.0 - 36.0 g/dL Final   RDW 10/17/2020 12.7  11.5 - 15.5 % Final   Platelets 10/17/2020 155  150 - 400 K/uL Final   nRBC 10/17/2020 0.0  0.0 - 0.2 % Final   Neutrophils Relative % 10/17/2020 61  % Final   Neutro Abs 10/17/2020 3.1  1.7 - 7.7 K/uL Final   Lymphocytes Relative 10/17/2020 22  % Final   Lymphs Abs 10/17/2020 1.1  0.7 - 4.0 K/uL Final   Monocytes Relative 10/17/2020 10  % Final   Monocytes Absolute 10/17/2020 0.5  0.1 - 1.0 K/uL Final   Eosinophils Relative 10/17/2020 6  % Final   Eosinophils Absolute 10/17/2020 0.3  0.0 - 0.5 K/uL Final  Basophils Relative 10/17/2020 1  % Final   Basophils Absolute 10/17/2020 0.0  0.0 - 0.1 K/uL Final   Immature Granulocytes 10/17/2020 0  % Final   Abs Immature Granulocytes 10/17/2020 0.01  0.00 - 0.07 K/uL Final   Performed at [facility], [address] [place], [place] [address]    Assessment:   1.   Multifocal triple negative RIGHT breast cancer- s/p 4 cycles of neoadjuvant  AC and 12 week of taxol completed 01/05/2018 followed by right partial mastectomy and sentinel lymph node biopsy 02/01/2018. S/p 8 cycles of xeloda 12/29/2018. Genetic testing was negative. Last mammogram 02/02/20 was negative. Bilateral breast MRI 08/03/20 was negative. Clinically asymptomatic today. Exam reassuring. CA27.29 pending today. Was not elevated at time of diagnosis so clinical utility is uncertain. Continue alternating mammogram and breast MRI every 6 months. We reviewed NCCN surveillance guidelines today as well as survivorship topics. Patientwishes to have her surveillance visits at the cancer center and is no longer followed by surgery or radiation oncology d/t preference.   2. Bone density screening- last bone density scan was 06/02/18. Based on post menopausal status and hx of chemotherapy sshe is at risk of bone loss. Will order bone density scan today. If normal, plan to repeat in 2 years. She will continue to supplement with calcium 1200 mg and vitamin d 1000 iu daily along with weight bearing exercise as tolerated.   Mammogram around 02/01/21.  4 months - lab (ca27.29 and MD to establish care.  I discussed the assessment and treatment plan with the patient.  The patient was provided an opportunity to ask questions and all were answered.  The patient agreed with the plan and demonstrated an understanding of the instructions.  The patient was advised to call back if the symptoms worsen or if the condition fails to improve as anticipated.  Beckey Rutter, DNP, AGNP-C [place] at [facility] [phone] (clinic)

## 2020-10-18 LAB — CANCER ANTIGEN 27.29: CA 27.29: 15.6 U/mL (ref 0.0–38.6)

## 2021-02-04 ENCOUNTER — Other Ambulatory Visit: Payer: Self-pay | Admitting: Nurse Practitioner

## 2021-02-04 ENCOUNTER — Ambulatory Visit
Admission: RE | Admit: 2021-02-04 | Discharge: 2021-02-04 | Disposition: A | Discharge status: Home / Self Care | Payer: Medicare Other | Source: Ambulatory Visit | Attending: Nurse Practitioner | Admitting: Nurse Practitioner

## 2021-02-04 ENCOUNTER — Other Ambulatory Visit: Payer: Self-pay

## 2021-02-04 DIAGNOSIS — N6323 Unspecified lump in the left breast, lower outer quadrant: Secondary | ICD-10-CM

## 2021-02-04 DIAGNOSIS — C50911 Malignant neoplasm of unspecified site of right female breast: Secondary | ICD-10-CM | POA: Insufficient documentation

## 2021-02-04 DIAGNOSIS — Z1231 Encounter for screening mammogram for malignant neoplasm of breast: Secondary | ICD-10-CM

## 2021-02-04 DIAGNOSIS — Z171 Estrogen receptor negative status [ER-]: Secondary | ICD-10-CM | POA: Insufficient documentation

## 2021-02-04 DIAGNOSIS — N6320 Unspecified lump in the left breast, unspecified quadrant: Secondary | ICD-10-CM

## 2021-02-14 ENCOUNTER — Ambulatory Visit
Admission: RE | Admit: 2021-02-14 | Discharge: 2021-02-14 | Disposition: A | Discharge status: Home / Self Care | Payer: Medicare Other | Source: Ambulatory Visit | Attending: Nurse Practitioner | Admitting: Nurse Practitioner

## 2021-02-14 ENCOUNTER — Other Ambulatory Visit: Payer: Self-pay

## 2021-02-14 DIAGNOSIS — Z1231 Encounter for screening mammogram for malignant neoplasm of breast: Secondary | ICD-10-CM

## 2021-02-14 DIAGNOSIS — N6323 Unspecified lump in the left breast, lower outer quadrant: Secondary | ICD-10-CM | POA: Diagnosis present

## 2021-02-18 ENCOUNTER — Other Ambulatory Visit: Payer: Self-pay

## 2021-02-18 DIAGNOSIS — C50911 Malignant neoplasm of unspecified site of right female breast: Secondary | ICD-10-CM

## 2021-02-20 ENCOUNTER — Inpatient Hospital Stay: Payer: Medicare Other | Attending: Internal Medicine

## 2021-02-20 ENCOUNTER — Inpatient Hospital Stay (HOSPITAL_BASED_OUTPATIENT_CLINIC_OR_DEPARTMENT_OTHER): Payer: Medicare Other | Admitting: Internal Medicine

## 2021-02-20 ENCOUNTER — Other Ambulatory Visit: Payer: Self-pay

## 2021-02-20 VITALS — BP 135/59 | HR 76 | Temp 97.9°F | Resp 18 | Wt 155.0 lb

## 2021-02-20 DIAGNOSIS — C50911 Malignant neoplasm of unspecified site of right female breast: Secondary | ICD-10-CM | POA: Diagnosis not present

## 2021-02-20 DIAGNOSIS — Z780 Asymptomatic menopausal state: Secondary | ICD-10-CM | POA: Diagnosis not present

## 2021-02-20 DIAGNOSIS — Z801 Family history of malignant neoplasm of trachea, bronchus and lung: Secondary | ICD-10-CM | POA: Insufficient documentation

## 2021-02-20 DIAGNOSIS — Z171 Estrogen receptor negative status [ER-]: Secondary | ICD-10-CM | POA: Insufficient documentation

## 2021-02-20 DIAGNOSIS — C50811 Malignant neoplasm of overlapping sites of right female breast: Secondary | ICD-10-CM | POA: Insufficient documentation

## 2021-02-20 NOTE — Progress Notes (Signed)
[facility] NOTE  Patient Care Team: Baxter Hire, MD as PCP - General (Internal Medicine) Herbert Pun, MD as Consulting Physician (General Surgery) Rico Junker, RN as Registered Nurse Lequita Asal, MD (Inactive) as Referring Physician (Hematology and Oncology) Noreene Filbert, MD as Referring Physician (Radiation Oncology)  CHIEF COMPLAINTS/PURPOSE OF CONSULTATION: breast cancer  Oncology History  Carcinoma of overlapping sites of right breast in female, estrogen receptor negative ([place])  06/04/2017 Initial Diagnosis   Malignant neoplasm of right breast in female, estrogen receptor negative ([place])   06/18/2018 -  Chemotherapy   The patient had capecitabine (XELODA) 150 MG tablet, 300 mg (100 % of original dose 300 mg), Oral, 2 times daily after meals, 1 of 1 cycle, Start date: 07/20/2018, End date: 10/05/2018 Dose modification: 300 mg (original dose 300 mg, Cycle 1) capecitabine (XELODA) 500 MG tablet, 1,500 mg (100 % of original dose 1,500 mg), Oral, 2 times daily after meals, 1 of 1 cycle, Start date: 11/17/2018, End date: 03/21/2019 Dose modification: 1,500 mg (original dose 1,500 mg, Cycle 1)   for chemotherapy treatment.        HISTORY OF PRESENTING ILLNESS: Alone.  Ambulating independently. Stephanie Sanders 75 y.o.  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  In interim patient underwent a bone density test and also her mammogram.  Patient denies any unusual headaches.  Any bone pain.  Any nausea vomiting.  No fever chills.   Review of Systems  Constitutional:  Negative for chills, diaphoresis, fever, malaise/fatigue and weight loss.  HENT:  Negative for nosebleeds and sore throat.   Eyes:  Negative for double vision.  Respiratory:  Negative for cough, hemoptysis, sputum production, shortness of breath and wheezing.   Cardiovascular:  Negative for chest pain,  palpitations, orthopnea and leg swelling.  Gastrointestinal:  Negative for abdominal pain, blood in stool, constipation, diarrhea, heartburn, melena, nausea and vomiting.  Genitourinary:  Negative for dysuria, frequency and urgency.  Musculoskeletal:  Positive for back pain and joint pain.  Skin: Negative.  Negative for itching and rash.  Neurological:  Negative for dizziness, tingling, focal weakness, weakness and headaches.  Endo/Heme/Allergies:  Does not bruise/bleed easily.  Psychiatric/Behavioral:  Negative for depression. The patient is not nervous/anxious and does not have insomnia.     MEDICAL HISTORY:  Past Medical History:  Diagnosis Date   Arthritis    knees   Cancer ([place]) 04/2017   right breast   Diabetes mellitus without complication (HCC)    Diabetic nephropathy (HCC)    Headache    migraines   Heart murmur    ASYMPTOMATIC   Hypertension    Hypothyroidism    Personal history of chemotherapy 2019   right breast cancer   Personal history of radiation therapy    Pneumonia    Seborrheic keratosis    Vitamin D deficiency     SURGICAL HISTORY: Past Surgical History:  Procedure Laterality Date   APPENDECTOMY     AXILLARY LYMPH NODE DISSECTION Right 02/01/2018   Procedure: AXILLARY LYMPH NODE DISSECTION;  Surgeon: Herbert Pun, MD;  Location: [facility];  Service: General;  Laterality: Right;   BREAST BIOPSY Right 05/28/2017   right breast bx 3 areas invasive mamm ca lymph node mets   BREAST CYST ASPIRATION Bilateral    neg   BREAST LUMPECTOMY Right 02/01/2018   CATARACT EXTRACTION W/PHACO Right 07/05/2019   Procedure: CATARACT EXTRACTION PHACO AND INTRAOCULAR LENS  PLACEMENT (IOC) RIGHT DIABETIC 10.78 00:55.5 ;  Surgeon: Birder Robson, MD;  Location: [facility];  Service: Ophthalmology;  Laterality: Right;  Diabetic - oral meds   CATARACT EXTRACTION W/PHACO Left 08/02/2019   Procedure: CATARACT EXTRACTION PHACO AND INTRAOCULAR LENS PLACEMENT (IOC)  LEFT DIABETIC 6.73  00:40.2;  Surgeon: Birder Robson, MD;  Location: [facility];  Service: Ophthalmology;  Laterality: Left;  Diabetic - oral meds   CHOLECYSTECTOMY     COLONOSCOPY WITH PROPOFOL N/A 01/01/2015   Procedure: COLONOSCOPY WITH PROPOFOL;  Surgeon: Josefine Class, MD;  [facility];  Service: Endoscopy;  Laterality: N/A;   EYE SURGERY Left    eyelid   PARTIAL MASTECTOMY WITH NEEDLE LOCALIZATION Right 02/01/2018   Procedure: PARTIAL MASTECTOMY WITH NEEDLE LOCALIZATION;  Surgeon: Herbert Pun, MD;  Location: [facility];  Service: General;  Laterality: Right;   PORTACATH PLACEMENT Right 06/10/2017   Procedure: INSERTION PORT-A-CATH;  Surgeon: Herbert Pun, MD;  Location: [facility];  Service: General;  Laterality: Right;   SENTINEL NODE BIOPSY Right 02/01/2018   Procedure: SENTINEL NODE BIOPSY;  Surgeon: Herbert Pun, MD;  Location: [facility];  Service: General;  Laterality: Right;   STENT PLACEMENT ILIAC ([place] HX)     temporary placement s/p cholecystectomy   TUBAL LIGATION      SOCIAL HISTORY: Social History   Socioeconomic History   Marital status: Married    Spouse name: Not on file   Number of children: Not on file   Years of education: Not on file   Highest education level: Not on file  Occupational History   Not on file  Tobacco Use   Smoking status: Former    Packs/day: 2.00    Years: 10.00    Pack years: 20.00    Types: Cigarettes    Quit date: 04/21/1972    Years since quitting: 48.8   Smokeless tobacco: Never  Vaping Use   Vaping Use: Never used  Substance and Sexual Activity   Alcohol use: No   Drug use: No   Sexual activity: Not on file  Other Topics Concern   Not on file  Social History Narrative   Not on file   Social Determinants of Health   Financial Resource Strain: Not on file  Food Insecurity: Not on file  Transportation Needs: Not on file  Physical Activity: Not on file  Stress: Not on  file  Social Connections: Not on file  Intimate Partner Violence: Not on file    FAMILY HISTORY: Family History  Problem Relation Age of Onset   Parkinson's disease Mother    Cancer Maternal Aunt    Cancer Father    Lung cancer Father    Breast cancer Neg Hx     ALLERGIES:  is allergic to fiorinal [butalbital-aspirin-caffeine], sulfa antibiotics, tramadol, and other.  MEDICATIONS:  Current Outpatient Medications  Medication Sig Dispense Refill   acetaminophen (TYLENOL) 500 MG tablet Take 1,000 mg by mouth every 6 (six) hours as needed for moderate pain or headache.     ascorbic acid (VITAMIN C) 1000 MG tablet Take by mouth.     atorvastatin (LIPITOR) 10 MG tablet Take 1 tablet by mouth daily.     Cholecalciferol 2000 UNITS CAPS Take 4,000 Units by mouth daily.     colesevelam (WELCHOL) 625 MG tablet Take by mouth.     CRANBERRY PO Take by mouth.     levothyroxine (SYNTHROID, LEVOTHROID) 50 MCG tablet Take 50 mcg by mouth daily before breakfast.  MAGNESIUM GLYCINATE PLUS PO Take 1 tablet by mouth at bedtime.      metFORMIN (GLUCOPHAGE) 1000 MG tablet Take 1,000 mg by mouth 2 (two) times daily with a meal.     Multiple Vitamin (MULTIVITAMIN) tablet Take 1 tablet by mouth daily.     Omega-3 Fatty Acids (FISH OIL) 1000 MG CAPS Take 1,000 mg by mouth daily.     propranolol (INDERAL) 10 MG tablet Take 10 mg by mouth 2 (two) times daily.     QUERCETIN PO Take by mouth.     quinapril (ACCUPRIL) 5 MG tablet Take 5 mg by mouth every morning.      Colesevelam HCl 3.75 g PACK Take by mouth. (Patient not taking: Reported on 02/20/2021)     levofloxacin (LEVAQUIN) 500 MG tablet Take 500 mg by mouth daily. (Patient not taking: Reported on 02/20/2021)     No current facility-administered medications for this visit.   Facility-Administered Medications Ordered in Other Visits  Medication Dose Route Frequency Provider Last Rate Last Admin   sodium chloride flush (NS) 0.9 % injection 10 mL  10  mL Intravenous PRN Lequita Asal, MD   10 mL at 10/21/17 0841      .  PHYSICAL EXAMINATION:  Vitals:   02/20/21 1422  BP: (!) 135/59  Pulse: 76  Resp: 18  Temp: 97.9 F (36.6 C)  SpO2: 100%   Filed Weights   02/20/21 1422  Weight: 155 lb (70.3 kg)    Physical Exam Vitals and nursing note reviewed.  HENT:     Head: Normocephalic and atraumatic.     Mouth/Throat:     Pharynx: Oropharynx is clear.  Eyes:     Extraocular Movements: Extraocular movements intact.     Pupils: Pupils are equal, round, and reactive to light.  Cardiovascular:     Rate and Rhythm: Normal rate and regular rhythm.  Pulmonary:     Comments: Decreased breath sounds bilaterally.  Abdominal:     Palpations: Abdomen is soft.  Musculoskeletal:        General: Normal range of motion.     Cervical back: Normal range of motion.  Skin:    General: Skin is warm.  Neurological:     General: No focal deficit present.     Mental Status: She is alert and oriented to person, place, and time.  Psychiatric:        Behavior: Behavior normal.        Judgment: Judgment normal.     LABORATORY DATA:  I have reviewed the data as listed Lab Results  Component Value Date   WBC 4.9 10/17/2020   HGB 13.8 10/17/2020   HCT 41.3 10/17/2020   MCV 95.6 10/17/2020   PLT 155 10/17/2020   Recent Labs    06/18/20 1055 10/17/20 1052  NA 139 135  K 4.1 4.1  CL 100 101  CO2 26 25  GLUCOSE 137* 171*  BUN 19 13  CREATININE 0.96 0.72  CALCIUM 9.8 9.5  GFRNONAA >60 >60  PROT 6.8 6.5  ALBUMIN 4.4 4.2  AST 24 27  ALT 26 32  ALKPHOS 49 43  BILITOT 1.0 1.1    RADIOGRAPHIC STUDIES: I have personally reviewed the radiological images as listed and agreed with the findings in the report. DG Bone Density  Result Date: 02/04/2021 EXAM: DUAL X-RAY ABSORPTIOMETRY (DXA) FOR BONE MINERAL DENSITY IMPRESSION: Dear Dr Zenia Resides, Your patient Stephanie Sanders completed a FRAX assessment on 02/04/2021 using the [facility]  iDXA DXA System (analysis version: 14.10) manufactured by [employer]. The following summarizes the results of our evaluation. PATIENT BIOGRAPHICAL: Name: Stephanie Sanders, Stephanie Sanders Patient ID: [id number] Birth Date: 10-28-1945 Height:    67.0 in. Gender:     Female    [age]       Weight:    154.6 lbs. Ethnicity:  White                            Exam Date: 02/04/2021 FRAX* RESULTS:  (version: 3.5) 10-year Probability of Fracture1 Major Osteoporotic Fracture2 Hip Fracture 10.0% 1.6% Population: [place] (Caucasian) Risk Factors: None Based on Femur (Left) Neck BMD 1 -The 10-year probability of fracture may be lower than reported if the patient has received treatment. 2 -Major Osteoporotic Fracture: Clinical Spine, Forearm, Hip or Shoulder *FRAX is a Materials engineer of the [employer] for Metabolic Bone Disease, a [place] (WHO) [facility]. ASSESSMENT: The probability of a major osteoporotic fracture is 10.0% within the next ten years. The probability of a hip fracture is 1.6% within the next ten years. . Your patient Stephanie Sanders completed a BMD test on 02/04/2021 using the [place] (software version: 14.10) manufactured by [employer]. The following summarizes the results of our evaluation. Technologist: MTB PATIENT BIOGRAPHICAL: Name: Stephanie Sanders, Stephanie Sanders Patient ID: [id number] Birth Date: 06/19/1945 Height: 67.0 in. Gender: Female Exam Date: 02/04/2021 Weight: 154.6 lbs. Indications: Caucasian, History of Breast Cancer, Hypothyroid, Postmenopausal Fractures: Treatments: Vitamin D, Synthroid DENSITOMETRY RESULTS: Site         Region     Measured Date Measured Age WHO Classification Young Adult T-score BMD         %Change vs. Previous Significant Change (*) AP Spine L1-L2 02/04/2021 75.7 Normal -0.7 1.087 g/cm2 4.5% - AP Spine L1-L2 06/21/2012 67.1 Osteopenia -1.1 1.040 g/cm2 - - DualFemur Neck Left 02/04/2021 75.7 Normal -1.0 0.895 g/cm2  -8.5% Yes DualFemur Neck Left 06/21/2012 67.1 Normal -0.4 0.978 g/cm2 - - DualFemur Total Mean 02/04/2021 75.7 Normal -0.1 0.994 g/cm2 -5.6% Yes DualFemur Total Mean 06/21/2012 67.1 Normal 0.4 1.053 g/cm2 - - Left Forearm Radius 33% 02/04/2021 75.7 Osteopenia -1.7 0.729 g/cm2 -12.2% Yes Left Forearm Radius 33% 06/21/2012 67.1 Normal -0.5 0.830 g/cm2 - - ASSESSMENT: The BMD measured at Forearm Radius 33% is 0.729 g/cm2 with a T-score of -1.7. This patient is considered OSTEOPENIC according to [facility]) criteria. The scan quality is good. L-3 and l-4 were excluded due to degenerative changes. Compared with prior study, there has been no significant change in the spine. Compared with prior study, there has been significant decrease in the total hip. World Pharmacologist [facility]) criteria for post-menopausal, Caucasian Women: Normal:                   T-score at or above -1 SD Osteopenia/low bone mass: T-score between -1 and -2.5 SD Osteoporosis:             T-score at or below -2.5 SD RECOMMENDATIONS: 1. All patients should optimize calcium and vitamin D intake. 2. Consider FDA-approved medical therapies in postmenopausal women and men aged 53 years and older, based on the following: a. A hip or vertebral(clinical or morphometric) fracture b. T-score < -2.5 at the femoral neck or spine after appropriate evaluation to exclude secondary causes c. Low bone mass (T-score between -1.0  and -2.5 at the femoral neck or spine) and a 10-year probability of a hip fracture > 3% or a 10-year probability of a major osteoporosis-related fracture > 20% based on the [place]-adapted WHO algorithm 3. Clinician judgment and/or patient preferences may indicate treatment for people with 10-year fracture probabilities above or below these levels FOLLOW-UP: People with diagnosed cases of osteoporosis or at high risk for fracture should have regular bone mineral density tests. For patients eligible for Medicare, routine  testing is allowed once every 2 years. The testing frequency can be increased to one year for patients who have rapidly progressing disease, those who are receiving or discontinuing medical therapy to restore bone mass, or have additional risk factors. I have reviewed this report, and agree with the above findings. Mark A. Thornton Papas, M.D. [facility] Electronically Signed   By: Lavonia Dana M.D.   On: 02/04/2021 18:22   US BREAST LTD UNI LEFT INC AXILLA  Result Date: 02/14/2021 CLINICAL DATA:  Patient reports palpable abnormality in the left breast for a few weeks, initially pea-sized but now decreased in size. History of right breast cancer status post lumpectomy and radiation in 2019. EXAM: DIGITAL DIAGNOSTIC BILATERAL MAMMOGRAM WITH TOMOSYNTHESIS AND CAD; ULTRASOUND LEFT BREAST LIMITED; ULTRASOUND RIGHT BREAST LIMITED TECHNIQUE: Bilateral digital diagnostic mammography and breast tomosynthesis was performed. The images were evaluated with computer-aided detection.; Targeted ultrasound examination of the left breast was performed.; Targeted ultrasound examination of the right breast was performed COMPARISON:  Previous exam(s). ACR Breast Density Category b: There are scattered areas of fibroglandular density. FINDINGS: RIGHT breast: Stable postoperative changes in the upper outer right breast and right axilla. Postradiation treatment changes with trabecular and skin thickening, stable. No new suspicious mass, calcification or other findings in the right breast. At the time of clinical exam, the patient also endorsed palpable nodularity immediately superior to the surgical scar in the upper-outer right breast. Targeted ultrasound of the right breast from the 9:30-12 o'clock positions, at the patient indicated area of palpable abnormality just above the scar, was performed. No suspicious solid or cystic mass. An oil cyst is noted immediately underlying the scar, consistent with mammographic findings.  LEFT breast: A metallic BB applied to the skin denotes the patient indicated area of palpable abnormality in the outer left breast. No underlying mammographic abnormality. No suspicious mass, calcification or other finding in the left breast. Targeted ultrasound of the left breast at the 2 o'clock position 9 cm from the nipple, at the patient indicated area of palpable abnormality, demonstrates a 0.5 cm intramammary lymph nodes with normal morphology. IMPRESSION: 1. A benign intramammary lymph node corresponds with the patient reported palpable finding in the outer left breast. 2. No mammographic findings of malignancy in either breast. RECOMMENDATION: Annual screening mammogram. I have discussed the findings and recommendations with the patient. If applicable, a reminder letter will be sent to the patient regarding the next appointment. BI-RADS CATEGORY  2: Benign. Electronically Signed   By: Ileana Roup M.D.   On: 02/14/2021 13:01  US BREAST LTD UNI RIGHT INC AXILLA  Result Date: 02/14/2021 CLINICAL DATA:  Patient reports palpable abnormality in the left breast for a few weeks, initially pea-sized but now decreased in size. History of right breast cancer status post lumpectomy and radiation in 2019. EXAM: DIGITAL DIAGNOSTIC BILATERAL MAMMOGRAM WITH TOMOSYNTHESIS AND CAD; ULTRASOUND LEFT BREAST LIMITED; ULTRASOUND RIGHT BREAST LIMITED TECHNIQUE: Bilateral digital diagnostic mammography and breast tomosynthesis was performed. The images were evaluated with computer-aided detection.;  Targeted ultrasound examination of the left breast was performed.; Targeted ultrasound examination of the right breast was performed COMPARISON:  Previous exam(s). ACR Breast Density Category b: There are scattered areas of fibroglandular density. FINDINGS: RIGHT breast: Stable postoperative changes in the upper outer right breast and right axilla. Postradiation treatment changes with trabecular and skin thickening, stable. No new  suspicious mass, calcification or other findings in the right breast. At the time of clinical exam, the patient also endorsed palpable nodularity immediately superior to the surgical scar in the upper-outer right breast. Targeted ultrasound of the right breast from the 9:30-12 o'clock positions, at the patient indicated area of palpable abnormality just above the scar, was performed. No suspicious solid or cystic mass. An oil cyst is noted immediately underlying the scar, consistent with mammographic findings. LEFT breast: A metallic BB applied to the skin denotes the patient indicated area of palpable abnormality in the outer left breast. No underlying mammographic abnormality. No suspicious mass, calcification or other finding in the left breast. Targeted ultrasound of the left breast at the 2 o'clock position 9 cm from the nipple, at the patient indicated area of palpable abnormality, demonstrates a 0.5 cm intramammary lymph nodes with normal morphology. IMPRESSION: 1. A benign intramammary lymph node corresponds with the patient reported palpable finding in the outer left breast. 2. No mammographic findings of malignancy in either breast. RECOMMENDATION: Annual screening mammogram. I have discussed the findings and recommendations with the patient. If applicable, a reminder letter will be sent to the patient regarding the next appointment. BI-RADS CATEGORY  2: Benign. Electronically Signed   By: Ileana Roup M.D.   On: 02/14/2021 13:01  MM DIAG BREAST TOMO BILATERAL  Result Date: 02/14/2021 CLINICAL DATA:  Patient reports palpable abnormality in the left breast for a few weeks, initially pea-sized but now decreased in size. History of right breast cancer status post lumpectomy and radiation in 2019. EXAM: DIGITAL DIAGNOSTIC BILATERAL MAMMOGRAM WITH TOMOSYNTHESIS AND CAD; ULTRASOUND LEFT BREAST LIMITED; ULTRASOUND RIGHT BREAST LIMITED TECHNIQUE: Bilateral digital diagnostic mammography and breast  tomosynthesis was performed. The images were evaluated with computer-aided detection.; Targeted ultrasound examination of the left breast was performed.; Targeted ultrasound examination of the right breast was performed COMPARISON:  Previous exam(s). ACR Breast Density Category b: There are scattered areas of fibroglandular density. FINDINGS: RIGHT breast: Stable postoperative changes in the upper outer right breast and right axilla. Postradiation treatment changes with trabecular and skin thickening, stable. No new suspicious mass, calcification or other findings in the right breast. At the time of clinical exam, the patient also endorsed palpable nodularity immediately superior to the surgical scar in the upper-outer right breast. Targeted ultrasound of the right breast from the 9:30-12 o'clock positions, at the patient indicated area of palpable abnormality just above the scar, was performed. No suspicious solid or cystic mass. An oil cyst is noted immediately underlying the scar, consistent with mammographic findings. LEFT breast: A metallic BB applied to the skin denotes the patient indicated area of palpable abnormality in the outer left breast. No underlying mammographic abnormality. No suspicious mass, calcification or other finding in the left breast. Targeted ultrasound of the left breast at the 2 o'clock position 9 cm from the nipple, at the patient indicated area of palpable abnormality, demonstrates a 0.5 cm intramammary lymph nodes with normal morphology. IMPRESSION: 1. A benign intramammary lymph node corresponds with the patient reported palpable finding in the outer left breast. 2. No mammographic findings of malignancy in either breast.  RECOMMENDATION: Annual screening mammogram. I have discussed the findings and recommendations with the patient. If applicable, a reminder letter will be sent to the patient regarding the next appointment. BI-RADS CATEGORY  2: Benign. Electronically Signed   By:  Ileana Roup M.D.   On: 02/14/2021 13:01   Carcinoma of overlapping sites of right breast in female, estrogen receptor negative ([employer]) 1.   Multifocal triple negative RIGHT breast cancer- s/p 4 cycles of neoadjuvant AC and 12 week of taxol completed 01/05/2018 followed by right partial mastectomy and sentinel lymph node biopsy [Dr.Cintron] 02/01/2018. S/p 8 cycles of xeloda 12/29/2018. Last mammogram 02/02/20 was negative. Bilateral breast MRI 08/03/20 was negative.   #Clinically asymptomatic.  No evidence of recurrence.  Mammogram OCT 2022-A benign intramammary lymph node corresponds with the patientmreported palpable finding in the outer left breast. No mammographic findings of malignancy in either breast   #Bone density screening: October 2022 T score -1.7; recommend additional calcium along with vitamin D Discussed the potential risk factors for osteoporosis- age/gender/postmenopausal status/use of anti-estrogen treatments. Discussed multiple options including exercise/ calcium and vitamin D supplementation/ and also use of bisphosphonates. Discussed oral bisphosphonates versus parenteral bisphosphonate like Reclast  Discussed the potential benefits and/side effects  Including but not limited to Osteonecrosis of jaw/ hypocalcemia.  For now we will continue conservative measures; will repeat bone density in 2 years.  # Genetic testing was negative.   # DISPOSITION:mychart # follow up in 6 months- MD; labs- cbc/cmp;ca-27-29-Dr.B      All questions were answered. The patient knows to call the clinic with any problems, questions or concerns.       Cammie Sickle, MD 02/20/2021 9:56 PM

## 2021-02-20 NOTE — Assessment & Plan Note (Addendum)
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 S/p 8 cycles of xeloda 12/29/2018. Last mammogram 02/02/20 was negative. Bilateral breast MRI 08/03/20 was negative.   #Clinically asymptomatic.  No evidence of recurrence.  Mammogram OCT 2022-A benign intramammary lymph node corresponds with the patientmreported palpable finding in the outer left breast. No mammographic findings of malignancy in either breast  #Bone density screening: October 2022 T score -1.7; recommend additional calcium along with vitamin D Discussed the potential risk factors for osteoporosis- age/gender/postmenopausal status/use of anti-estrogen treatments. Discussed multiple options including exercise/ calcium and vitamin D supplementation/ and also use of bisphosphonates. Discussed oral bisphosphonates versus parenteral bisphosphonate like Reclast  Discussed the potential benefits and/side effects  Including but not limited to Osteonecrosis of jaw/ hypocalcemia.  For now we will continue conservative measures; will repeat bone density in 2 years.  # Genetic testing was negative.   # DISPOSITION:mychart # follow up in 6 months- MD; labs- cbc/cmp;ca-27-29-Dr.B

## 2021-02-21 LAB — CANCER ANTIGEN 27.29: CA 27.29: 12.1 U/mL (ref 0.0–38.6)

## 2021-05-20 ENCOUNTER — Ambulatory Visit: Payer: Medicare Other | Admitting: Radiation Oncology

## 2021-07-15 ENCOUNTER — Telehealth: Payer: Self-pay | Admitting: Medical Oncology

## 2021-07-15 NOTE — Telephone Encounter (Signed)
WF [id number], UPBEAT (Understanding and Predicting Breast Cancer Events after Treatment) ? ?Outgoing call: [phone] ? ?LVMOM with patient regarding her 5 year on study follow up phone call. Asked patient to return call at earliest convenience. My call back number was provided. Patient thanked. ? ?Maxwell Marion, RN, BSN, [place] ?Clinical Research Nurse Lead ?07/15/2021 1:28 PM ?  ?

## 2021-07-16 ENCOUNTER — Telehealth: Payer: Self-pay | Admitting: Medical Oncology

## 2021-07-16 NOTE — Telephone Encounter (Signed)
WF [id number], UPBEAT (Understanding and Predicting Breast Cancer Events after Treatment) ? ?Study Follow-up year 4 ? ?Patient returned call.  Denies any hospitalization since last research visit, July 11, 2020. ?Patient also denies any cardiac events: MI, PCI, CABG, heart catheterization, CVA, or diagnosis of heart failure since last research visit. ? ?I inquired with patient if she wishes to complete study questionnaire on line, patient confirmed she does. Patient informed to expect questionnaire to be delivered to her email address in the next few days.  ? ?Patient knows next study call to be in approximately one years time. I thanked patient for her time and her continued contribution to the study. Patient denies any questions at this time and was encouraged to call Dr. Rogue Bussing or myself with any questions.  ? ?Maxwell Marion, RN, BSN, [place] ?Clinical Research Nurse Lead ?07/16/2021 10:28 AM ? ?

## 2021-08-20 ENCOUNTER — Encounter: Payer: Self-pay | Admitting: Internal Medicine

## 2021-08-20 ENCOUNTER — Inpatient Hospital Stay: Payer: Medicare Other | Attending: Internal Medicine

## 2021-08-20 ENCOUNTER — Inpatient Hospital Stay (HOSPITAL_BASED_OUTPATIENT_CLINIC_OR_DEPARTMENT_OTHER): Payer: Medicare Other | Admitting: Internal Medicine

## 2021-08-20 VITALS — BP 134/61 | HR 74 | Temp 98.9°F | Ht 68.0 in | Wt 152.6 lb

## 2021-08-20 DIAGNOSIS — C50911 Malignant neoplasm of unspecified site of right female breast: Secondary | ICD-10-CM

## 2021-08-20 DIAGNOSIS — Z1231 Encounter for screening mammogram for malignant neoplasm of breast: Secondary | ICD-10-CM | POA: Diagnosis not present

## 2021-08-20 DIAGNOSIS — C50811 Malignant neoplasm of overlapping sites of right female breast: Secondary | ICD-10-CM | POA: Diagnosis present

## 2021-08-20 DIAGNOSIS — Z79899 Other long term (current) drug therapy: Secondary | ICD-10-CM | POA: Insufficient documentation

## 2021-08-20 DIAGNOSIS — Z171 Estrogen receptor negative status [ER-]: Secondary | ICD-10-CM | POA: Insufficient documentation

## 2021-08-20 LAB — CBC WITH DIFFERENTIAL/PLATELET
Abs Immature Granulocytes: 0.01 10*3/uL (ref 0.00–0.07)
Basophils Absolute: 0 10*3/uL (ref 0.0–0.1)
Basophils Relative: 0 %
Eosinophils Absolute: 0.1 10*3/uL (ref 0.0–0.5)
Eosinophils Relative: 3 %
HCT: 41.5 % (ref 36.0–46.0)
Hemoglobin: 14.3 g/dL (ref 12.0–15.0)
Immature Granulocytes: 0 %
Lymphocytes Relative: 29 %
Lymphs Abs: 1.4 10*3/uL (ref 0.7–4.0)
MCH: 32.1 pg (ref 26.0–34.0)
MCHC: 34.5 g/dL (ref 30.0–36.0)
MCV: 93.3 fL (ref 80.0–100.0)
Monocytes Absolute: 0.4 10*3/uL (ref 0.1–1.0)
Monocytes Relative: 9 %
Neutro Abs: 2.8 10*3/uL (ref 1.7–7.7)
Neutrophils Relative %: 59 %
Platelets: 154 10*3/uL (ref 150–400)
RBC: 4.45 MIL/uL (ref 3.87–5.11)
RDW: 12.5 % (ref 11.5–15.5)
WBC: 4.7 10*3/uL (ref 4.0–10.5)
nRBC: 0 % (ref 0.0–0.2)

## 2021-08-20 LAB — COMPREHENSIVE METABOLIC PANEL
ALT: 32 U/L (ref 0–44)
AST: 28 U/L (ref 15–41)
Albumin: 4.3 g/dL (ref 3.5–5.0)
Alkaline Phosphatase: 45 U/L (ref 38–126)
Anion gap: 8 (ref 5–15)
BUN: 15 mg/dL (ref 8–23)
CO2: 25 mmol/L (ref 22–32)
Calcium: 9.6 mg/dL (ref 8.9–10.3)
Chloride: 103 mmol/L (ref 98–111)
Creatinine, Ser: 0.9 mg/dL (ref 0.44–1.00)
GFR, Estimated: >60 mL/min (ref >60)
Glucose, Bld: 167 mg/dL — ABNORMAL HIGH (ref 70–99)
Potassium: 4.1 mmol/L (ref 3.5–5.1)
Sodium: 136 mmol/L (ref 135–145)
Total Bilirubin: 1.1 mg/dL (ref 0.3–1.2)
Total Protein: 7 g/dL (ref 6.5–8.1)

## 2021-08-20 NOTE — Progress Notes (Signed)
[facility] [facility] ?CONSULT NOTE ? ?Patient Care Team: ?Baxter Hire, MD as PCP - General (Internal Medicine) ?Herbert Pun, MD as Consulting Physician (General Surgery) ?Rico Junker, RN as Registered Nurse ?Lequita Asal, MD (Inactive) as Referring Physician (Hematology and Oncology) ?Noreene Filbert, MD as Referring Physician (Radiation Oncology) ?Cammie Sickle, MD as Consulting Physician (Internal Medicine) ? ?CHIEF COMPLAINTS/PURPOSE OF CONSULTATION: breast cancer ? ?Oncology History  ?Carcinoma of overlapping sites of right breast in female, estrogen receptor negative ([place])  ?06/04/2017 Initial Diagnosis  ? Malignant neoplasm of right breast in female, estrogen receptor negative ([place]) ? ?  ?06/18/2018 -  Chemotherapy  ? The patient had capecitabine (XELODA) 150 MG tablet, 300 mg (100 % of original dose 300 mg), Oral, 2 times daily after meals, 1 of 1 cycle, Start date: 07/20/2018, End date: 10/05/2018 ?Dose modification: 300 mg (original dose 300 mg, Cycle 1) ?capecitabine (XELODA) 500 MG tablet, 1,500 mg (100 % of original dose 1,500 mg), Oral, 2 times daily after meals, 1 of 1 cycle, Start date: 11/17/2018, End date: 03/21/2019 ?Dose modification: 1,500 mg (original dose 1,500 mg, Cycle 1) ? ? for chemotherapy treatment.  ? ?  ? ? ? ?HISTORY OF PRESENTING ILLNESS: Alone.  Ambulating independently. ?Stephanie Sanders 76 y.o.  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 ? ?In interim patient underwent a bone density test and also her mammogram. ? ?Patient denies any unusual headaches.  Any bone pain.  Any nausea vomiting.  No fever chills.  ? ?Review of Systems  ?Constitutional:  Negative for chills, diaphoresis, fever, malaise/fatigue and weight loss.  ?HENT:  Negative for nosebleeds and sore throat.   ?Eyes:  Negative for double vision.  ?Respiratory:  Negative for cough, hemoptysis, sputum production, shortness of  breath and wheezing.   ?Cardiovascular:  Negative for chest pain, palpitations, orthopnea and leg swelling.  ?Gastrointestinal:  Negative for abdominal pain, blood in stool, constipation, diarrhea, heartburn, melena, nausea and vomiting.  ?Genitourinary:  Negative for dysuria, frequency and urgency.  ?Musculoskeletal:  Positive for back pain and joint pain.  ?Skin: Negative.  Negative for itching and rash.  ?Neurological:  Negative for dizziness, tingling, focal weakness, weakness and headaches.  ?Endo/Heme/Allergies:  Does not bruise/bleed easily.  ?Psychiatric/Behavioral:  Negative for depression. The patient is not nervous/anxious and does not have insomnia.    ? ?MEDICAL HISTORY:  ?Past Medical History:  ?Diagnosis Date  ? Arthritis   ? knees  ? Cancer ([place]) 04/2017  ? right breast  ? Diabetes mellitus without complication ([place])   ? Diabetic nephropathy ([place])   ? Headache   ? migraines  ? Heart murmur   ? ASYMPTOMATIC  ? Hypertension   ? Hypothyroidism   ? Personal history of chemotherapy 2019  ? right breast cancer  ? Personal history of radiation therapy   ? Pneumonia   ? Seborrheic keratosis   ? Vitamin D deficiency   ? ? ?SURGICAL HISTORY: ?Past Surgical History:  ?Procedure Laterality Date  ? APPENDECTOMY    ? AXILLARY LYMPH NODE DISSECTION Right 02/01/2018  ? Procedure: AXILLARY LYMPH NODE DISSECTION;  Surgeon: Herbert Pun, MD;  Location: [facility];  Service: General;  Laterality: Right;  ? BREAST BIOPSY Right 05/28/2017  ? right breast bx 3 areas invasive mamm ca lymph node mets  ? BREAST CYST ASPIRATION Bilateral   ? neg  ? BREAST LUMPECTOMY Right 02/01/2018  ? CATARACT EXTRACTION W/PHACO Right  07/05/2019  ? Procedure: CATARACT EXTRACTION PHACO AND INTRAOCULAR LENS PLACEMENT (IOC) RIGHT DIABETIC 10.78 00:55.5 ;  Surgeon: Birder Robson, MD;  Location: [place];  Service: Ophthalmology;  Laterality: Right;  Diabetic - oral meds  ? CATARACT EXTRACTION W/PHACO Left 08/02/2019  ?  Procedure: CATARACT EXTRACTION PHACO AND INTRAOCULAR LENS PLACEMENT (IOC) LEFT DIABETIC 6.73  00:40.2;  Surgeon: Birder Robson, MD;  Location: [place];  Service: Ophthalmology;  Laterality: Left;  Diabetic - oral meds  ? CHOLECYSTECTOMY    ? COLONOSCOPY WITH PROPOFOL N/A 01/01/2015  ? Procedure: COLONOSCOPY WITH PROPOFOL;  Surgeon: Josefine Class, MD;  [facility];  Service: Endoscopy;  Laterality: N/A;  ? EYE SURGERY Left   ? eyelid  ? PARTIAL MASTECTOMY WITH NEEDLE LOCALIZATION Right 02/01/2018  ? Procedure: PARTIAL MASTECTOMY WITH NEEDLE LOCALIZATION;  Surgeon: Herbert Pun, MD;  Location: [facility];  Service: General;  Laterality: Right;  ? PORTACATH PLACEMENT Right 06/10/2017  ? Procedure: INSERTION PORT-A-CATH;  Surgeon: Herbert Pun, MD;  Location: [facility];  Service: General;  Laterality: Right;  ? SENTINEL NODE BIOPSY Right 02/01/2018  ? Procedure: SENTINEL NODE BIOPSY;  Surgeon: Herbert Pun, MD;  Location: [facility];  Service: General;  Laterality: Right;  ? STENT PLACEMENT ILIAC ([place] HX)    ? temporary placement s/p cholecystectomy  ? TUBAL LIGATION    ? ? ?SOCIAL HISTORY: ?Social History  ? ?Socioeconomic History  ? Marital status: Married  ?  Spouse name: Not on file  ? Number of children: Not on file  ? Years of education: Not on file  ? Highest education level: Not on file  ?Occupational History  ? Not on file  ?Tobacco Use  ? Smoking status: Former  ?  Packs/day: 2.00  ?  Years: 10.00  ?  Pack years: 20.00  ?  Types: Cigarettes  ?  Quit date: 04/21/1972  ?  Years since quitting: 49.3  ? Smokeless tobacco: Never  ?Vaping Use  ? Vaping Use: Never used  ?Substance and Sexual Activity  ? Alcohol use: No  ? Drug use: No  ? Sexual activity: Not on file  ?Other Topics Concern  ? Not on file  ?Social History Narrative  ? Not on file  ? ?Social Determinants of Health  ? ?Financial Resource Strain: Not on file  ?Food Insecurity: Not on file   ?Transportation Needs: Not on file  ?Physical Activity: Not on file  ?Stress: Not on file  ?Social Connections: Not on file  ?Intimate Partner Violence: Not on file  ? ? ?FAMILY HISTORY: ?Family History  ?Problem Relation Age of Onset  ? Parkinson's disease Mother   ? Cancer Maternal Aunt   ? Cancer Father   ? Lung cancer Father   ? Breast cancer Neg Hx   ? ? ?ALLERGIES:  is allergic to fiorinal [butalbital-aspirin-caffeine], sulfa antibiotics, tramadol, and other. ? ?MEDICATIONS:  ?Current Outpatient Medications  ?Medication Sig Dispense Refill  ? acetaminophen (TYLENOL) 500 MG tablet Take 1,000 mg by mouth every 6 (six) hours as needed for moderate pain or headache.    ? atorvastatin (LIPITOR) 10 MG tablet Take 1 tablet by mouth daily.    ? Cholecalciferol 2000 UNITS CAPS Take 4,000 Units by mouth daily.    ? colesevelam (WELCHOL) 625 MG tablet Take by mouth.    ? CRANBERRY PO Take by mouth.    ? levothyroxine (SYNTHROID, LEVOTHROID) 50 MCG tablet Take 50 mcg by mouth daily before breakfast.    ?  metFORMIN (GLUCOPHAGE) 1000 MG tablet Take 1,000 mg by mouth 2 (two) times daily with a meal.    ? Multiple Vitamin (MULTIVITAMIN) tablet Take 1 tablet by mouth daily.    ? propranolol (INDERAL) 10 MG tablet Take 10 mg by mouth 2 (two) times daily.    ? quinapril (ACCUPRIL) 5 MG tablet Take 5 mg by mouth every morning.     ? ?No current facility-administered medications for this visit.  ? ?Facility-Administered Medications Ordered in Other Visits  ?Medication Dose Route Frequency Provider Last Rate Last Admin  ? sodium chloride flush (NS) 0.9 % injection 10 mL  10 mL Intravenous PRN Lequita Asal, MD   10 mL at 10/21/17 0841  ? ? ?  ?. ? ?PHYSICAL EXAMINATION: ? ?Vitals:  ? 08/20/21 1317  ?BP: 134/61  ?Pulse: 74  ?Temp: 98.9 ?F (37.2 ?C)  ?SpO2: 100%  ? ?Filed Weights  ? 08/20/21 1317  ?Weight: 152 lb 9.6 oz (69.2 kg)  ? ? ?Physical Exam ?Vitals and nursing note reviewed.  ?HENT:  ?   Head: Normocephalic and  atraumatic.  ?   Mouth/Throat:  ?   Pharynx: Oropharynx is clear.  ?Eyes:  ?   Extraocular Movements: Extraocular movements intact.  ?   Pupils: Pupils are equal, round, and reactive to light.  ?Cardiovascular:  ?   Rate and

## 2021-08-20 NOTE — Assessment & Plan Note (Addendum)
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 S/p 8 cycles of xeloda 12/29/2018. Last mammogram 02/02/20 was negative. Bilateral breast MRI 08/03/20 was negative.  ? ?#Clinically asymptomatic.  No evidence of recurrence.  Mammogram OCT 2022-A benign intramammary lymph node corresponds with the patientmreported palpable finding in the outer left breast. No mammographic findings of malignancy in either breast.  Ordered screening mammogram for November 2023.  Given history of triple negative breast cancer-I think is reasonable to get an screening bilateral MRI breast.  ?? ?#Bone density screening: October 2022 T score -1.7; recommend additional calcium along with vitamin D ?For now we will continue conservative measures; will repeat bone density in 2 years. ? ?Mychart/MRI breast  ?# DISPOSITION: ?# Breast MRI in 2-3 week ?# Bil screening mammo in oct 2023.  ?# follow up in 6 months- MD; labs- cbc/cmp;ca-27-29-Dr.B ?? ?

## 2021-08-21 LAB — CANCER ANTIGEN 27.29: CA 27.29: 12.7 U/mL (ref 0.0–38.6)

## 2021-08-30 ENCOUNTER — Ambulatory Visit
Admission: RE | Admit: 2021-08-30 | Discharge: 2021-08-30 | Disposition: A | Discharge status: Home / Self Care | Payer: Medicare Other | Source: Ambulatory Visit | Attending: Internal Medicine | Admitting: Internal Medicine

## 2021-08-30 DIAGNOSIS — C50811 Malignant neoplasm of overlapping sites of right female breast: Secondary | ICD-10-CM | POA: Insufficient documentation

## 2021-08-30 DIAGNOSIS — Z171 Estrogen receptor negative status [ER-]: Secondary | ICD-10-CM | POA: Diagnosis present

## 2021-08-30 MED ORDER — GADOBUTROL 1 MMOL/ML IV SOLN
7.0000 mL | Freq: Once | INTRAVENOUS | Status: AC | PRN
  Administered 2021-08-30: 7 mL via INTRAVENOUS

## 2021-09-23 ENCOUNTER — Other Ambulatory Visit: Payer: Self-pay | Admitting: Oncology

## 2022-02-18 ENCOUNTER — Ambulatory Visit
Admission: RE | Admit: 2022-02-18 | Discharge: 2022-02-18 | Disposition: A | Discharge status: Home / Self Care | Payer: Medicare Other | Source: Ambulatory Visit | Attending: Internal Medicine | Admitting: Internal Medicine

## 2022-02-18 DIAGNOSIS — C50811 Malignant neoplasm of overlapping sites of right female breast: Secondary | ICD-10-CM | POA: Insufficient documentation

## 2022-02-18 DIAGNOSIS — Z171 Estrogen receptor negative status [ER-]: Secondary | ICD-10-CM

## 2022-02-18 DIAGNOSIS — Z1231 Encounter for screening mammogram for malignant neoplasm of breast: Secondary | ICD-10-CM

## 2022-02-21 ENCOUNTER — Inpatient Hospital Stay: Payer: Medicare Other | Attending: Internal Medicine

## 2022-02-21 ENCOUNTER — Inpatient Hospital Stay (HOSPITAL_BASED_OUTPATIENT_CLINIC_OR_DEPARTMENT_OTHER): Payer: Medicare Other | Admitting: Medical Oncology

## 2022-02-21 VITALS — BP 123/53 | HR 75 | Temp 98.0°F | Wt 152.3 lb

## 2022-02-21 DIAGNOSIS — Z171 Estrogen receptor negative status [ER-]: Secondary | ICD-10-CM | POA: Insufficient documentation

## 2022-02-21 DIAGNOSIS — C50811 Malignant neoplasm of overlapping sites of right female breast: Secondary | ICD-10-CM | POA: Insufficient documentation

## 2022-02-21 DIAGNOSIS — Z9221 Personal history of antineoplastic chemotherapy: Secondary | ICD-10-CM | POA: Insufficient documentation

## 2022-02-21 DIAGNOSIS — Z923 Personal history of irradiation: Secondary | ICD-10-CM | POA: Diagnosis not present

## 2022-02-21 DIAGNOSIS — Z801 Family history of malignant neoplasm of trachea, bronchus and lung: Secondary | ICD-10-CM | POA: Diagnosis not present

## 2022-02-21 DIAGNOSIS — Z170 Estrogen receptor positive status [ER+]: Secondary | ICD-10-CM | POA: Insufficient documentation

## 2022-02-21 DIAGNOSIS — Z87891 Personal history of nicotine dependence: Secondary | ICD-10-CM | POA: Diagnosis not present

## 2022-02-21 DIAGNOSIS — Z809 Family history of malignant neoplasm, unspecified: Secondary | ICD-10-CM | POA: Diagnosis not present

## 2022-02-21 DIAGNOSIS — C50911 Malignant neoplasm of unspecified site of right female breast: Secondary | ICD-10-CM

## 2022-02-21 DIAGNOSIS — Z1231 Encounter for screening mammogram for malignant neoplasm of breast: Secondary | ICD-10-CM

## 2022-02-21 LAB — CBC WITH DIFFERENTIAL/PLATELET
Abs Immature Granulocytes: 0.02 10*3/uL (ref 0.00–0.07)
Basophils Absolute: 0 10*3/uL (ref 0.0–0.1)
Basophils Relative: 0 %
Eosinophils Absolute: 0.1 10*3/uL (ref 0.0–0.5)
Eosinophils Relative: 2 %
HCT: 42.2 % (ref 36.0–46.0)
Hemoglobin: 14.2 g/dL (ref 12.0–15.0)
Immature Granulocytes: 0 %
Lymphocytes Relative: 28 %
Lymphs Abs: 1.3 10*3/uL (ref 0.7–4.0)
MCH: 31.6 pg (ref 26.0–34.0)
MCHC: 33.6 g/dL (ref 30.0–36.0)
MCV: 93.8 fL (ref 80.0–100.0)
Monocytes Absolute: 0.5 10*3/uL (ref 0.1–1.0)
Monocytes Relative: 10 %
Neutro Abs: 2.8 10*3/uL (ref 1.7–7.7)
Neutrophils Relative %: 60 %
Platelets: 182 10*3/uL (ref 150–400)
RBC: 4.5 MIL/uL (ref 3.87–5.11)
RDW: 12.8 % (ref 11.5–15.5)
WBC: 4.7 10*3/uL (ref 4.0–10.5)
nRBC: 0 % (ref 0.0–0.2)

## 2022-02-21 LAB — COMPREHENSIVE METABOLIC PANEL
ALT: 36 U/L (ref 0–44)
AST: 32 U/L (ref 15–41)
Albumin: 4.6 g/dL (ref 3.5–5.0)
Alkaline Phosphatase: 48 U/L (ref 38–126)
Anion gap: 9 (ref 5–15)
BUN: 15 mg/dL (ref 8–23)
CO2: 26 mmol/L (ref 22–32)
Calcium: 9.9 mg/dL (ref 8.9–10.3)
Chloride: 103 mmol/L (ref 98–111)
Creatinine, Ser: 0.96 mg/dL (ref 0.44–1.00)
GFR, Estimated: >60 mL/min (ref >60)
Glucose, Bld: 144 mg/dL — ABNORMAL HIGH (ref 70–99)
Potassium: 4.3 mmol/L (ref 3.5–5.1)
Sodium: 138 mmol/L (ref 135–145)
Total Bilirubin: 1 mg/dL (ref 0.3–1.2)
Total Protein: 7.1 g/dL (ref 6.5–8.1)

## 2022-02-21 NOTE — Progress Notes (Signed)
[place] NOTE  Patient Care Team: Baxter Hire, MD as PCP - General (Internal Medicine) Herbert Pun, MD as Consulting Physician (General Surgery) Rico Junker, RN as Registered Nurse Lequita Asal, MD (Inactive) as Referring Physician (Hematology and Oncology) Noreene Filbert, MD as Referring Physician (Radiation Oncology) Cammie Sickle, MD as Consulting Physician (Internal Medicine)  CHIEF COMPLAINTS/PURPOSE OF CONSULTATION: breast cancer  Oncology History  Carcinoma of overlapping sites of right breast in female, estrogen receptor negative ([place])  06/04/2017 Initial Diagnosis   Malignant neoplasm of right breast in female, estrogen receptor negative ([place])   06/18/2018 - 06/18/2018 Chemotherapy   Patient is on Treatment Plan : BREAST POST-NEOADJUVANT CAPECITABINE q21d        HISTORY OF PRESENTING ILLNESS: Alone.  Ambulating independently. Stephanie Sanders 76 y.o.  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  She reports today with her husband. They report that she is doing well. No concerns. No breast changes, lumps, bumps, discharge. No unintentional weight loss, night sweats. She had a mammogram yesterday.   Patient denies any unusual headaches.  Any bone pain.  Any nausea vomiting.  No fever chills.   Wt Readings from Last 3 Encounters:  02/21/22 152 lb 4.8 oz (69.1 kg)  08/20/21 152 lb 9.6 oz (69.2 kg)  02/20/21 155 lb (70.3 kg)     Review of Systems  Constitutional:  Negative for chills, diaphoresis, fever, malaise/fatigue and weight loss.  HENT:  Negative for nosebleeds and sore throat.   Eyes:  Negative for double vision.  Respiratory:  Negative for cough, hemoptysis, sputum production, shortness of breath and wheezing.   Cardiovascular:  Negative for chest pain, palpitations, orthopnea and leg swelling.  Gastrointestinal:  Negative for abdominal  pain, blood in stool, constipation, diarrhea, heartburn, melena, nausea and vomiting.  Genitourinary:  Negative for dysuria, frequency and urgency.  Musculoskeletal:  Positive for back pain and joint pain.  Skin: Negative.  Negative for itching and rash.  Neurological:  Negative for dizziness, tingling, focal weakness, weakness and headaches.  Endo/Heme/Allergies:  Does not bruise/bleed easily.  Psychiatric/Behavioral:  Negative for depression. The patient is not nervous/anxious and does not have insomnia.      MEDICAL HISTORY:  Past Medical History:  Diagnosis Date   Arthritis    knees   Cancer ([place]) 04/2017   right breast   Diabetes mellitus without complication ([facility])    Diabetic nephropathy ([facility])    Headache    migraines   Heart murmur    ASYMPTOMATIC   Hypertension    Hypothyroidism    Personal history of chemotherapy 2019   right breast cancer   Personal history of radiation therapy    Pneumonia    Seborrheic keratosis    Vitamin D deficiency     SURGICAL HISTORY: Past Surgical History:  Procedure Laterality Date   APPENDECTOMY     AXILLARY LYMPH NODE DISSECTION Right 02/01/2018   Procedure: AXILLARY LYMPH NODE DISSECTION;  Surgeon: Herbert Pun, MD;  Location: [facility];  Service: General;  Laterality: Right;   BREAST BIOPSY Right 05/28/2017   right breast bx 3 areas invasive mamm ca lymph node mets   BREAST CYST ASPIRATION Bilateral    neg   BREAST LUMPECTOMY Right 02/01/2018   CATARACT EXTRACTION W/PHACO Right 07/05/2019   Procedure: CATARACT EXTRACTION PHACO AND INTRAOCULAR LENS PLACEMENT ([place]) RIGHT DIABETIC 10.78 00:55.5 ;  Surgeon: Birder Robson, MD;  [facility];  Service: Ophthalmology;  Laterality: Right;  Diabetic - oral meds   CATARACT EXTRACTION W/PHACO Left 08/02/2019   Procedure: CATARACT EXTRACTION PHACO AND INTRAOCULAR LENS PLACEMENT (IOC) LEFT DIABETIC 6.73  00:40.2;  Surgeon: Birder Robson, MD;  Location: [place];  Service: Ophthalmology;  Laterality: Left;  Diabetic - oral meds   CHOLECYSTECTOMY     COLONOSCOPY WITH PROPOFOL N/A 01/01/2015   Procedure: COLONOSCOPY WITH PROPOFOL;  Surgeon: Josefine Class, MD;  Location: [facility] ENDOSCOPY;  Service: Endoscopy;  Laterality: N/A;   EYE SURGERY Left    eyelid   PARTIAL MASTECTOMY WITH NEEDLE LOCALIZATION Right 02/01/2018   Procedure: PARTIAL MASTECTOMY WITH NEEDLE LOCALIZATION;  Surgeon: Herbert Pun, MD;  Location: [facility];  Service: General;  Laterality: Right;   PORTACATH PLACEMENT Right 06/10/2017   Procedure: INSERTION PORT-A-CATH;  Surgeon: Herbert Pun, MD;  Location: [facility];  Service: General;  Laterality: Right;   SENTINEL NODE BIOPSY Right 02/01/2018   Procedure: SENTINEL NODE BIOPSY;  Surgeon: Herbert Pun, MD;  Location: [facility];  Service: General;  Laterality: Right;   STENT PLACEMENT ILIAC ([place] HX)     temporary placement s/p cholecystectomy   TUBAL LIGATION      SOCIAL HISTORY: Social History   Socioeconomic History   Marital status: Married    Spouse name: Not on file   Number of children: Not on file   Years of education: Not on file   Highest education level: Not on file  Occupational History   Not on file  Tobacco Use   Smoking status: Former    Packs/day: 2.00    Years: 10.00    Total pack years: 20.00    Types: Cigarettes    Quit date: 04/21/1972    Years since quitting: 49.8   Smokeless tobacco: Never  Vaping Use   Vaping Use: Never used  Substance and Sexual Activity   Alcohol use: No   Drug use: No   Sexual activity: Not on file  Other Topics Concern   Not on file  Social History Narrative   Not on file   Social Determinants of Health   Financial Resource Strain: Not on file  Food Insecurity: Not on file  Transportation Needs: Not on file  Physical Activity: Not on file  Stress: Not on file  Social Connections: Not on file  Intimate Partner Violence: Not on  file    FAMILY HISTORY: Family History  Problem Relation Age of Onset   Parkinson's disease Mother    Cancer Maternal Aunt    Cancer Father    Lung cancer Father    Breast cancer Neg Hx     ALLERGIES:  is allergic to fiorinal [butalbital-aspirin-caffeine], sulfa antibiotics, tramadol, and other.  MEDICATIONS:  Current Outpatient Medications  Medication Sig Dispense Refill   acetaminophen (TYLENOL) 500 MG tablet Take 1,000 mg by mouth every 6 (six) hours as needed for moderate pain or headache.     atorvastatin (LIPITOR) 10 MG tablet Take 1 tablet by mouth daily.     Cholecalciferol 2000 UNITS CAPS Take 4,000 Units by mouth daily.     colesevelam (WELCHOL) 625 MG tablet Take by mouth.     CRANBERRY PO Take by mouth.     levothyroxine (SYNTHROID, LEVOTHROID) 50 MCG tablet Take 50 mcg by mouth daily before breakfast.     metFORMIN (GLUCOPHAGE) 1000 MG tablet Take 1,000 mg by mouth 2 (two) times daily with a meal.     Multiple Vitamin (MULTIVITAMIN)  tablet Take 1 tablet by mouth daily.     propranolol (INDERAL) 10 MG tablet Take 10 mg by mouth 2 (two) times daily.     quinapril (ACCUPRIL) 5 MG tablet Take 5 mg by mouth every morning.      No current facility-administered medications for this visit.   Facility-Administered Medications Ordered in Other Visits  Medication Dose Route Frequency Provider Last Rate Last Admin   sodium chloride flush (NS) 0.9 % injection 10 mL  10 mL Intravenous PRN Lequita Asal, MD   10 mL at 10/21/17 0841      .  PHYSICAL EXAMINATION:  Vitals:   02/21/22 1343  BP: (!) 123/53  Pulse: 75  Temp: 98 F (36.7 C)   Filed Weights   02/21/22 1343  Weight: 152 lb 4.8 oz (69.1 kg)    Physical Exam Vitals and nursing note reviewed.  HENT:     Head: Normocephalic and atraumatic.     Mouth/Throat:     Pharynx: Oropharynx is clear.  Eyes:     Extraocular Movements: Extraocular movements intact.     Pupils: Pupils are equal, round, and  reactive to light.  Cardiovascular:     Rate and Rhythm: Normal rate and regular rhythm.  Pulmonary:     Comments: Decreased breath sounds bilaterally.  Abdominal:     Palpations: Abdomen is soft.  Musculoskeletal:        General: Normal range of motion.     Cervical back: Normal range of motion.  Skin:    General: Skin is warm.  Neurological:     General: No focal deficit present.     Mental Status: She is alert and oriented to person, place, and time.  Psychiatric:        Behavior: Behavior normal.        Judgment: Judgment normal.      LABORATORY DATA:  I have reviewed the data as listed Lab Results  Component Value Date   WBC 4.7 02/21/2022   HGB 14.2 02/21/2022   HCT 42.2 02/21/2022   MCV 93.8 02/21/2022   PLT 182 02/21/2022   Recent Labs    08/20/21 1308 02/21/22 1323  NA 136 138  K 4.1 4.3  CL 103 103  CO2 25 26  GLUCOSE 167* 144*  BUN 15 15  CREATININE 0.90 0.96  CALCIUM 9.6 9.9  GFRNONAA >60 >60  PROT 7.0 7.1  ALBUMIN 4.3 4.6  AST 28 32  ALT 32 36  ALKPHOS 45 48  BILITOT 1.1 1.0     RADIOGRAPHIC STUDIES: I have personally reviewed the radiological images as listed and agreed with the findings in the report. MM 3D SCREEN BREAST BILATERAL  Result Date: 02/20/2022 CLINICAL DATA:  Screening. EXAM: DIGITAL SCREENING BILATERAL MAMMOGRAM WITH TOMOSYNTHESIS AND CAD TECHNIQUE: Bilateral screening digital craniocaudal and mediolateral oblique mammograms were obtained. Bilateral screening digital breast tomosynthesis was performed. The images were evaluated with computer-aided detection. COMPARISON:  Previous exam(s). ACR Breast Density Category c: The breast tissue is heterogeneously dense, which may obscure small masses. FINDINGS: There are no findings suspicious for malignancy. IMPRESSION: No mammographic evidence of malignancy. A result letter of this screening mammogram will be mailed directly to the patient. RECOMMENDATION: Screening mammogram in one  year. (Code:SM-B-01Y) BI-RADS CATEGORY  1: Negative. Electronically Signed   By: Kristopher Oppenheim M.D.   On: 02/20/2022 12:21    No problem-specific Assessment & Plan notes found for this encounter.  Encounter Diagnosis  Name Primary?  Malignant neoplasm of right breast in female, estrogen receptor negative, unspecified site of breast ([place]) Yes   She is doing well. CA 27.29 pending. She continues to deny any breast changes or concerns. Recent mammogram from yesterday was Bi-RADS category 1. She typically gets a breast MR 6 months from mammogram due to her breast density and history of triple negative breast cancer. She would like to have this obtained again in 6 months.   RTC 6 months MD, labs (CBC w/, CMP, CA 27.29) after breast MR (in 6 months)   All questions were answered. The patient knows to call the clinic with any problems, questions or concerns.   Stephanie Closs, PA-C 02/21/2022 2:09 PM

## 2022-02-22 LAB — CANCER ANTIGEN 27.29: CA 27.29: 15.1 U/mL (ref 0.0–38.6)

## 2022-07-29 ENCOUNTER — Telehealth: Payer: Self-pay | Admitting: Medical Oncology

## 2022-07-29 ENCOUNTER — Encounter: Payer: Self-pay | Admitting: Medical Oncology

## 2022-07-29 DIAGNOSIS — C50911 Malignant neoplasm of unspecified site of right female breast: Secondary | ICD-10-CM

## 2022-07-29 NOTE — Telephone Encounter (Signed)
WF [id number], UPBEAT (Understanding and Predicting Breast Cancer Events after Treatment)  Outgoing call: 1325  LVMOM with patient regarding her 5 year on study follow up phone call. Asked patient to return call at earliest convenience. Patient was provided with my contact information for return call. Patient thanked.  Rexene Edison, RN, BSN, [facility] Clinical Research Nurse Lead 07/29/2022 1:28 PM

## 2022-07-29 NOTE — Progress Notes (Signed)
WF [id number], UPBEAT (Understanding and Predicting Breast Cancer Events after Treatment)  Study Follow-up year 5  Patient returned my call.  Patient confirms to be doing well and denies any hospitalization since last research visit, July 16, 2021. Patient also denies any cardiac events: MI, PCI, CABG, heart catheterization, CVA, or diagnosis of heart failure since last research visit.  I inquired with patient if she wishes to complete study questionnaire on line, patient confirmed she does and confirms her email hasn't changed. Patient informed to expect questionnaire to be delivered to her email address in the next few days.   Patient informed next study call to be in approximately one years time. I thanked patient for her time and her continued support contribution to the study. Patient denies any questions at this time and was encouraged to call Dr. Donneta Romberg or myself with any questions.   Rexene Edison, RN, BSN, [facility] Clinical Research Nurse Lead 07/29/2022 1:32 PM

## 2022-08-22 ENCOUNTER — Ambulatory Visit: Payer: Medicare Other | Admitting: Nurse Practitioner

## 2022-08-22 ENCOUNTER — Other Ambulatory Visit: Payer: Medicare Other

## 2022-09-04 ENCOUNTER — Ambulatory Visit: Payer: Medicare Other

## 2022-09-09 ENCOUNTER — Other Ambulatory Visit: Payer: Medicare Other

## 2022-09-09 ENCOUNTER — Ambulatory Visit: Payer: Medicare Other | Admitting: Nurse Practitioner

## 2022-09-12 ENCOUNTER — Ambulatory Visit
Admission: RE | Admit: 2022-09-12 | Discharge: 2022-09-12 | Disposition: A | Discharge status: Home / Self Care | Payer: Medicare Other | Source: Ambulatory Visit | Attending: Medical Oncology | Admitting: Medical Oncology

## 2022-09-12 DIAGNOSIS — Z171 Estrogen receptor negative status [ER-]: Secondary | ICD-10-CM | POA: Diagnosis present

## 2022-09-12 DIAGNOSIS — C50911 Malignant neoplasm of unspecified site of right female breast: Secondary | ICD-10-CM | POA: Insufficient documentation

## 2022-09-12 MED ORDER — GADOBUTROL 1 MMOL/ML IV SOLN
7.0000 mL | Freq: Once | INTRAVENOUS | Status: AC | PRN
  Administered 2022-09-12: 7 mL via INTRAVENOUS

## 2022-09-17 ENCOUNTER — Other Ambulatory Visit: Payer: Self-pay

## 2022-09-17 DIAGNOSIS — C50911 Malignant neoplasm of unspecified site of right female breast: Secondary | ICD-10-CM

## 2022-09-18 ENCOUNTER — Inpatient Hospital Stay: Payer: Medicare Other | Attending: Internal Medicine

## 2022-09-18 ENCOUNTER — Inpatient Hospital Stay (HOSPITAL_BASED_OUTPATIENT_CLINIC_OR_DEPARTMENT_OTHER): Payer: Medicare Other | Admitting: Nurse Practitioner

## 2022-09-18 ENCOUNTER — Encounter: Payer: Self-pay | Admitting: Nurse Practitioner

## 2022-09-18 VITALS — BP 110/60 | HR 81 | Temp 97.1°F | Wt 143.0 lb

## 2022-09-18 DIAGNOSIS — Z08 Encounter for follow-up examination after completed treatment for malignant neoplasm: Secondary | ICD-10-CM | POA: Diagnosis not present

## 2022-09-18 DIAGNOSIS — C50911 Malignant neoplasm of unspecified site of right female breast: Secondary | ICD-10-CM | POA: Diagnosis not present

## 2022-09-18 DIAGNOSIS — C50811 Malignant neoplasm of overlapping sites of right female breast: Secondary | ICD-10-CM | POA: Insufficient documentation

## 2022-09-18 DIAGNOSIS — Z171 Estrogen receptor negative status [ER-]: Secondary | ICD-10-CM

## 2022-09-18 DIAGNOSIS — Z853 Personal history of malignant neoplasm of breast: Secondary | ICD-10-CM

## 2022-09-18 DIAGNOSIS — Z87891 Personal history of nicotine dependence: Secondary | ICD-10-CM | POA: Insufficient documentation

## 2022-09-18 DIAGNOSIS — Z09 Encounter for follow-up examination after completed treatment for conditions other than malignant neoplasm: Secondary | ICD-10-CM | POA: Diagnosis not present

## 2022-09-18 LAB — CBC WITH DIFFERENTIAL/PLATELET
Abs Immature Granulocytes: 0.02 10*3/uL (ref 0.00–0.07)
Basophils Absolute: 0 10*3/uL (ref 0.0–0.1)
Basophils Relative: 1 %
Eosinophils Absolute: 0.2 10*3/uL (ref 0.0–0.5)
Eosinophils Relative: 3 %
HCT: 41.1 % (ref 36.0–46.0)
Hemoglobin: 13.7 g/dL (ref 12.0–15.0)
Immature Granulocytes: 0 %
Lymphocytes Relative: 28 %
Lymphs Abs: 1.5 10*3/uL (ref 0.7–4.0)
MCH: 31.4 pg (ref 26.0–34.0)
MCHC: 33.3 g/dL (ref 30.0–36.0)
MCV: 94.3 fL (ref 80.0–100.0)
Monocytes Absolute: 0.5 10*3/uL (ref 0.1–1.0)
Monocytes Relative: 9 %
Neutro Abs: 3.3 10*3/uL (ref 1.7–7.7)
Neutrophils Relative %: 59 %
Platelets: 164 10*3/uL (ref 150–400)
RBC: 4.36 MIL/uL (ref 3.87–5.11)
RDW: 12.9 % (ref 11.5–15.5)
WBC: 5.5 10*3/uL (ref 4.0–10.5)
nRBC: 0 % (ref 0.0–0.2)

## 2022-09-18 LAB — COMPREHENSIVE METABOLIC PANEL
ALT: 25 U/L (ref 0–44)
AST: 25 U/L (ref 15–41)
Albumin: 4.5 g/dL (ref 3.5–5.0)
Alkaline Phosphatase: 46 U/L (ref 38–126)
Anion gap: 13 (ref 5–15)
BUN: 15 mg/dL (ref 8–23)
CO2: 23 mmol/L (ref 22–32)
Calcium: 9.4 mg/dL (ref 8.9–10.3)
Chloride: 100 mmol/L (ref 98–111)
Creatinine, Ser: 0.96 mg/dL (ref 0.44–1.00)
GFR, Estimated: >60 mL/min (ref >60)
Glucose, Bld: 182 mg/dL — ABNORMAL HIGH (ref 70–99)
Potassium: 4.2 mmol/L (ref 3.5–5.1)
Sodium: 136 mmol/L (ref 135–145)
Total Bilirubin: 1.2 mg/dL (ref 0.3–1.2)
Total Protein: 7.1 g/dL (ref 6.5–8.1)

## 2022-09-18 NOTE — Progress Notes (Signed)
[facility] CONSULT NOTE  Patient Care Team: Gracelyn Nurse, MD as PCP - General (Internal Medicine) Carolan Shiver, MD as Consulting Physician (General Surgery) Jim Like, RN as Registered Nurse Rosey Bath, MD (Inactive) as Referring Physician (Hematology and Oncology) Carmina Miller, MD as Referring Physician (Radiation Oncology) Earna Coder, MD as Consulting Physician (Internal Medicine)  CHIEF COMPLAINTS/PURPOSE OF CONSULTATION: breast cancer  Oncology History  Carcinoma of overlapping sites of right breast in female, estrogen receptor negative (HCC)  06/04/2017 Initial Diagnosis   Malignant neoplasm of right breast in female, estrogen receptor negative (HCC)   06/18/2018 - 06/18/2018 Chemotherapy   Patient is on Treatment Plan : BREAST POST-NEOADJUVANT CAPECITABINE q21d       HISTORY OF PRESENTING ILLNESS: Alone. Ambulating independently. Stephanie Sanders 77 y.o.  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 She saw PCP for complaints of thudding in her ear and is awaiting carotid ultrasounds. No new bone pain. No nausea, vomiting, fevers, chills, or unintentional weight loss. Denies unusual headaches. Denies breast pain, skin changes, or masses. No unintentional weight loss.    Review of Systems  Constitutional:  Negative for chills, diaphoresis, fever, malaise/fatigue and weight loss.  HENT:  Negative for ear pain, hearing loss, nosebleeds and sore throat.   Eyes:  Negative for double vision.  Respiratory:  Negative for cough, hemoptysis, sputum production, shortness of breath and wheezing.   Cardiovascular:  Negative for chest pain, palpitations, orthopnea and leg swelling.  Gastrointestinal:  Negative for abdominal pain, blood in stool, constipation, diarrhea, heartburn, melena, nausea and vomiting.  Genitourinary:  Negative for dysuria, frequency and urgency.   Musculoskeletal:  Negative for back pain and joint pain.  Skin:  Negative for itching and rash.  Neurological:  Negative for dizziness, tingling, focal weakness, weakness and headaches.  Endo/Heme/Allergies:  Does not bruise/bleed easily.  Psychiatric/Behavioral:  Negative for depression. The patient is not nervous/anxious and does not have insomnia.      MEDICAL HISTORY:  Past Medical History:  Diagnosis Date   Arthritis    knees   Cancer (HCC) 04/2017   right breast   Diabetes mellitus without complication (HCC)    Diabetic nephropathy (HCC)    Headache    migraines   Heart murmur    ASYMPTOMATIC   Hypertension    Hypothyroidism    Personal history of chemotherapy 2019   right breast cancer   Personal history of radiation therapy    Pneumonia    Seborrheic keratosis    Vitamin D deficiency     SURGICAL HISTORY: Past Surgical History:  Procedure Laterality Date   APPENDECTOMY     AXILLARY LYMPH NODE DISSECTION Right 02/01/2018   Procedure: AXILLARY LYMPH NODE DISSECTION;  Surgeon: Carolan Shiver, MD;  Location: [facility];  Service: General;  Laterality: Right;   BREAST BIOPSY Right 05/28/2017   right breast bx 3 areas invasive mamm ca lymph node mets   BREAST CYST ASPIRATION Bilateral    neg   BREAST LUMPECTOMY Right 02/01/2018   CATARACT EXTRACTION W/PHACO Right 07/05/2019   Procedure: CATARACT EXTRACTION PHACO AND INTRAOCULAR LENS PLACEMENT (IOC) RIGHT DIABETIC 10.78 00:55.5 ;  Surgeon: Galen Manila, MD;  Location: [facility];  Service: Ophthalmology;  Laterality: Right;  Diabetic - oral meds   CATARACT EXTRACTION W/PHACO Left 08/02/2019   Procedure: CATARACT EXTRACTION PHACO AND INTRAOCULAR LENS PLACEMENT (IOC) LEFT DIABETIC 6.73  00:40.2;  Surgeon: Galen Manila,  MD;  Location: [facility];  Service: Ophthalmology;  Laterality: Left;  Diabetic - oral meds   CHOLECYSTECTOMY     COLONOSCOPY WITH PROPOFOL N/A 01/01/2015   Procedure:  COLONOSCOPY WITH PROPOFOL;  Surgeon: Elnita Maxwell, MD;  [facility];  Service: Endoscopy;  Laterality: N/A;   EYE SURGERY Left    eyelid   PARTIAL MASTECTOMY WITH NEEDLE LOCALIZATION Right 02/01/2018   Procedure: PARTIAL MASTECTOMY WITH NEEDLE LOCALIZATION;  Surgeon: Carolan Shiver, MD;  Location: [facility];  Service: General;  Laterality: Right;   PORTACATH PLACEMENT Right 06/10/2017   Procedure: INSERTION PORT-A-CATH;  Surgeon: Carolan Shiver, MD;  Location: [facility];  Service: General;  Laterality: Right;   SENTINEL NODE BIOPSY Right 02/01/2018   Procedure: SENTINEL NODE BIOPSY;  Surgeon: Carolan Shiver, MD;  Location: [facility];  Service: General;  Laterality: Right;   STENT PLACEMENT ILIAC ([facility] H[facility])     temporary placement s/p cholecystectomy   TUBAL LIGATION      SOCIAL HISTORY: Social History   Socioeconomic History   Marital status: Married    Spouse name: Not on file   Number of children: Not on file   Years of education: Not on file   Highest education level: Not on file  Occupational History   Not on file  Tobacco Use   Smoking status: Former    Packs/day: 2.00    Years: 10.00    Additional pack years: 0.00    Total pack years: 20.00    Types: Cigarettes    Quit date: 04/21/1972    Years since quitting: 50.4   Smokeless tobacco: Never  Vaping Use   Vaping Use: Never used  Substance and Sexual Activity   Alcohol use: No   Drug use: No   Sexual activity: Not on file  Other Topics Concern   Not on file  Social History Narrative   Not on file   Social Determinants of Health   Financial Resource Strain: Not on file  Food Insecurity: Not on file  Transportation Needs: Not on file  Physical Activity: Not on file  Stress: Not on file  Social Connections: Not on file  Intimate Partner Violence: Not on file    FAMILY HISTORY: Family History  Problem Relation Age of Onset   Parkinson's disease Mother    Cancer Maternal  Aunt    Cancer Father    Lung cancer Father    Breast cancer Neg Hx     ALLERGIES:  is allergic to fiorinal [butalbital-aspirin-caffeine], sulfa antibiotics, tramadol, and other.  MEDICATIONS:  Current Outpatient Medications  Medication Sig Dispense Refill   acetaminophen (TYLENOL) 500 MG tablet Take 1,000 mg by mouth every 6 (six) hours as needed for moderate pain or headache.     atorvastatin (LIPITOR) 10 MG tablet Take 1 tablet by mouth daily.     Cholecalciferol 2000 UNITS CAPS Take 4,000 Units by mouth daily.     colesevelam (WELCHOL) 625 MG tablet Take by mouth.     CRANBERRY PO Take by mouth.     levothyroxine (SYNTHROID, LEVOTHROID) 50 MCG tablet Take 50 mcg by mouth daily before breakfast.     metFORMIN (GLUCOPHAGE) 1000 MG tablet Take 1,000 mg by mouth 2 (two) times daily with a meal.     Multiple Vitamin (MULTIVITAMIN) tablet Take 1 tablet by mouth daily.     propranolol (INDERAL) 10 MG tablet Take 10 mg by mouth 2 (two) times daily.     quinapril (ACCUPRIL) 5 MG  tablet Take 5 mg by mouth every morning.      No current facility-administered medications for this visit.   Facility-Administered Medications Ordered in Other Visits  Medication Dose Route Frequency Provider Last Rate Last Admin   sodium chloride flush (NS) 0.9 % injection 10 mL  10 mL Intravenous PRN Nelva Nay C, MD   10 mL at 10/21/17 0841    PHYSICAL EXAMINATION: Vitals:   09/18/22 1339  BP: 110/60  Pulse: 81  Temp: (!) 97.1 F (36.2 C)   Filed Weights   09/18/22 1339  Weight: 143 lb (64.9 kg)   Physical Exam Vitals reviewed.  Constitutional:      Appearance: She is not ill-appearing.  HENT:     Head: Normocephalic and atraumatic.     Mouth/Throat:     Pharynx: Oropharynx is clear.  Pulmonary:     Comments: Decreased breath sounds bilaterally.  Abdominal:     General: There is no distension.     Palpations: Abdomen is soft.  Musculoskeletal:        General: Normal range of motion.   Skin:    General: Skin is warm.     Coloration: Skin is not pale.  Neurological:     General: No focal deficit present.     Mental Status: She is alert and oriented to person, place, and time.  Psychiatric:        Mood and Affect: Mood normal.        Behavior: Behavior normal.     LABORATORY DATA:  I have reviewed the data as listed Lab Results  Component Value Date   WBC 5.5 09/18/2022   HGB 13.7 09/18/2022   HCT 41.1 09/18/2022   MCV 94.3 09/18/2022   PLT 164 09/18/2022   Recent Labs    02/21/22 1323 09/18/22 1325  NA 138 136  K 4.3 4.2  CL 103 100  CO2 26 23  GLUCOSE 144* 182*  BUN 15 15  CREATININE 0.96 0.96  CALCIUM 9.9 9.4  GFRNONAA >60 >60  PROT 7.1 7.1  ALBUMIN 4.6 4.5  AST 32 25  ALT 36 25  ALKPHOS 48 46  BILITOT 1.0 1.2    RADIOGRAPHIC STUDIES: I have personally reviewed the radiological images as listed and agreed with the findings in the report. MR BREAST BILATERAL W WO CONTRAST INC CAD  Result Date: 09/12/2022 CLINICAL DATA:  77 year old female presenting for high risk screening. She has history of triple negative breast cancer in 2019 status post lumpectomy, chemotherapy and radiation therapy. EXAM: BILATERAL BREAST MRI WITH AND WITHOUT CONTRAST TECHNIQUE: Multiplanar, multisequence MR images of both breasts were obtained prior to and following the intravenous administration of 7 ml of Gadavist Three-dimensional MR images were rendered by post-processing of the original MR data on an independent workstation. The three-dimensional MR images were interpreted, and findings are reported in the following complete MRI report for this study. Three dimensional images were evaluated at the independent interpreting workstation using the DynaCAD thin client. COMPARISON:  Previous exam(s). FINDINGS: Breast composition: c. Heterogeneous fibroglandular tissue. Background parenchymal enhancement: Mild Right breast: Surgical changes in the lateral posterior right breast  are consistent with history of prior lumpectomy. No suspicious masses or abnormal enhancement. Left breast: No mass or abnormal enhancement. Lymph nodes: No abnormal appearing lymph nodes. Ancillary findings:  None. IMPRESSION: No MRI evidence of malignancy in the bilateral breasts. RECOMMENDATION: 1.  Annual screening mammography due in early November 2024. 2.  Annual high risk screening MRI in  1 year. BI-RADS CATEGORY  2: Benign. Electronically Signed   By: Frederico Hamman M.D.   On: 09/12/2022 10:50    Assessment & Plan:  Multifocal triple negative Right breast cancer- s/p 4 cycles of neoadjuvant AC chemotherapy and 12 weeks of taxol, completed 01/05/18, followed by right partial mastectomy and sentinel lymph node biopsy (Dr Maia Plan) on 02/01/18. S/p 8 cycles of xeloda 12/29/18. Her most recent mammogram was 02/18/22 and reported as bi-rads category 1: negative. Her most recent MRI was 09/12/22 and reported as bi-rads category 2: benign. Given high risk nature of her disease, she will continue annual mammogram and annual MRI, alternating every 6 months. Clinically asymptomatic today. Continue surveillance.  Osteopenia - October 2022 T score - 1.7. Plan to repeat in October 2024; can be performed with mammogram for convenience. Continue calcium 1200 mg and vitamin D 1000 iu daily along with exercise as tolerated.   DISPOSITION: Nov 2024- screening mammogram & bone density 6 mo- lab (cbc, cmp, ca 27.29), Dr Donneta Romberg for breast cancer surveillance - [place]  No problem-specific Assessment & Plan notes found for this encounter.  All questions were answered. The patient knows to call the clinic with any problems, questions or concerns.   Alinda Dooms, NP 09/18/2022

## 2022-09-19 LAB — CANCER ANTIGEN 27.29: CA 27.29: 17.9 U/mL (ref 0.0–38.6)

## 2023-02-23 ENCOUNTER — Ambulatory Visit
Admission: RE | Admit: 2023-02-23 | Discharge: 2023-02-23 | Disposition: A | Discharge status: Home / Self Care | Payer: Medicare Other | Source: Ambulatory Visit | Attending: Nurse Practitioner | Admitting: Nurse Practitioner

## 2023-02-23 DIAGNOSIS — Z08 Encounter for follow-up examination after completed treatment for malignant neoplasm: Secondary | ICD-10-CM | POA: Diagnosis present

## 2023-02-23 DIAGNOSIS — Z13820 Encounter for screening for osteoporosis: Secondary | ICD-10-CM | POA: Diagnosis not present

## 2023-02-23 DIAGNOSIS — M8588 Other specified disorders of bone density and structure, other site: Secondary | ICD-10-CM | POA: Insufficient documentation

## 2023-02-23 DIAGNOSIS — Z171 Estrogen receptor negative status [ER-]: Secondary | ICD-10-CM | POA: Insufficient documentation

## 2023-02-23 DIAGNOSIS — Z853 Personal history of malignant neoplasm of breast: Secondary | ICD-10-CM | POA: Insufficient documentation

## 2023-02-23 DIAGNOSIS — Z7984 Long term (current) use of oral hypoglycemic drugs: Secondary | ICD-10-CM | POA: Insufficient documentation

## 2023-02-23 DIAGNOSIS — C50911 Malignant neoplasm of unspecified site of right female breast: Secondary | ICD-10-CM | POA: Insufficient documentation

## 2023-02-23 DIAGNOSIS — Z09 Encounter for follow-up examination after completed treatment for conditions other than malignant neoplasm: Secondary | ICD-10-CM

## 2023-02-23 DIAGNOSIS — Z780 Asymptomatic menopausal state: Secondary | ICD-10-CM | POA: Insufficient documentation

## 2023-02-23 DIAGNOSIS — Z1231 Encounter for screening mammogram for malignant neoplasm of breast: Secondary | ICD-10-CM | POA: Insufficient documentation

## 2023-02-23 DIAGNOSIS — E119 Type 2 diabetes mellitus without complications: Secondary | ICD-10-CM | POA: Insufficient documentation

## 2023-02-23 DIAGNOSIS — E039 Hypothyroidism, unspecified: Secondary | ICD-10-CM | POA: Insufficient documentation

## 2023-03-23 ENCOUNTER — Encounter: Payer: Self-pay | Admitting: Internal Medicine

## 2023-03-23 ENCOUNTER — Inpatient Hospital Stay (HOSPITAL_BASED_OUTPATIENT_CLINIC_OR_DEPARTMENT_OTHER): Payer: Medicare Other | Admitting: Internal Medicine

## 2023-03-23 ENCOUNTER — Inpatient Hospital Stay: Payer: Medicare Other | Attending: Internal Medicine

## 2023-03-23 VITALS — BP 127/59 | HR 67 | Temp 97.6°F | Ht 68.0 in | Wt 145.8 lb

## 2023-03-23 DIAGNOSIS — Z9011 Acquired absence of right breast and nipple: Secondary | ICD-10-CM | POA: Diagnosis not present

## 2023-03-23 DIAGNOSIS — Z853 Personal history of malignant neoplasm of breast: Secondary | ICD-10-CM | POA: Diagnosis present

## 2023-03-23 DIAGNOSIS — Z08 Encounter for follow-up examination after completed treatment for malignant neoplasm: Secondary | ICD-10-CM

## 2023-03-23 DIAGNOSIS — Z809 Family history of malignant neoplasm, unspecified: Secondary | ICD-10-CM | POA: Insufficient documentation

## 2023-03-23 DIAGNOSIS — Z801 Family history of malignant neoplasm of trachea, bronchus and lung: Secondary | ICD-10-CM | POA: Insufficient documentation

## 2023-03-23 DIAGNOSIS — Z171 Estrogen receptor negative status [ER-]: Secondary | ICD-10-CM | POA: Diagnosis not present

## 2023-03-23 DIAGNOSIS — C50911 Malignant neoplasm of unspecified site of right female breast: Secondary | ICD-10-CM

## 2023-03-23 DIAGNOSIS — C50811 Malignant neoplasm of overlapping sites of right female breast: Secondary | ICD-10-CM

## 2023-03-23 LAB — CBC WITH DIFFERENTIAL/PLATELET
Abs Immature Granulocytes: 0.01 10*3/uL (ref 0.00–0.07)
Basophils Absolute: 0 10*3/uL (ref 0.0–0.1)
Basophils Relative: 1 %
Eosinophils Absolute: 0.1 10*3/uL (ref 0.0–0.5)
Eosinophils Relative: 3 %
HCT: 38.8 % (ref 36.0–46.0)
Hemoglobin: 12.9 g/dL (ref 12.0–15.0)
Immature Granulocytes: 0 %
Lymphocytes Relative: 23 %
Lymphs Abs: 1.3 10*3/uL (ref 0.7–4.0)
MCH: 31.5 pg (ref 26.0–34.0)
MCHC: 33.2 g/dL (ref 30.0–36.0)
MCV: 94.6 fL (ref 80.0–100.0)
Monocytes Absolute: 0.6 10*3/uL (ref 0.1–1.0)
Monocytes Relative: 10 %
Neutro Abs: 3.5 10*3/uL (ref 1.7–7.7)
Neutrophils Relative %: 63 %
Platelets: 167 10*3/uL (ref 150–400)
RBC: 4.1 MIL/uL (ref 3.87–5.11)
RDW: 13 % (ref 11.5–15.5)
WBC: 5.5 10*3/uL (ref 4.0–10.5)
nRBC: 0 % (ref 0.0–0.2)

## 2023-03-23 LAB — COMPREHENSIVE METABOLIC PANEL
ALT: 18 U/L (ref 0–44)
AST: 18 U/L (ref 15–41)
Albumin: 4.2 g/dL (ref 3.5–5.0)
Alkaline Phosphatase: 44 U/L (ref 38–126)
Anion gap: 10 (ref 5–15)
BUN: 17 mg/dL (ref 8–23)
CO2: 28 mmol/L (ref 22–32)
Calcium: 9.2 mg/dL (ref 8.9–10.3)
Chloride: 99 mmol/L (ref 98–111)
Creatinine, Ser: 0.77 mg/dL (ref 0.44–1.00)
GFR, Estimated: >60 mL/min (ref >60)
Glucose, Bld: 104 mg/dL — ABNORMAL HIGH (ref 70–99)
Potassium: 4.2 mmol/L (ref 3.5–5.1)
Sodium: 137 mmol/L (ref 135–145)
Total Bilirubin: 1 mg/dL (ref <1.2)
Total Protein: 6.5 g/dL (ref 6.5–8.1)

## 2023-03-23 NOTE — Progress Notes (Signed)
[facility] CONSULT NOTE  Patient Care Team: Gracelyn Nurse, MD as PCP - General (Internal Medicine) Carolan Shiver, MD as Consulting Physician (General Surgery) Jim Like, RN as Registered Nurse Rosey Bath, MD (Inactive) as Referring Physician (Hematology and Oncology) Carmina Miller, MD as Referring Physician (Radiation Oncology) Earna Coder, MD as Consulting Physician (Internal Medicine)  CHIEF COMPLAINTS/PURPOSE OF CONSULTATION: breast cancer  Oncology History  Carcinoma of overlapping sites of right breast in female, estrogen receptor negative (HCC)  06/04/2017 Initial Diagnosis   Malignant neoplasm of right breast in female, estrogen receptor negative (HCC)   06/18/2018 - 06/18/2018 Chemotherapy   Patient is on Treatment Plan : BREAST POST-NEOADJUVANT CAPECITABINE q21d       HISTORY OF PRESENTING ILLNESS: Alone.  Ambulating independently.  Stephanie Sanders 77 y.o.  female with a history of triple negative breast cancer currently under surveillance is here for follow-up/ and review the BMD/ mammogram.   C/o back pain 6/10, spouse is bed ridden, she has to care for him. Takes tylenol and ibuprofen.   Patient denies any unusual headaches.  Denies any nausea vomiting.  No fever chills.   Review of Systems  Constitutional:  Negative for chills, diaphoresis, fever, malaise/fatigue and weight loss.  HENT:  Negative for nosebleeds and sore throat.   Eyes:  Negative for double vision.  Respiratory:  Negative for cough, hemoptysis, sputum production, shortness of breath and wheezing.   Cardiovascular:  Negative for chest pain, palpitations, orthopnea and leg swelling.  Gastrointestinal:  Negative for abdominal pain, blood in stool, constipation, diarrhea, heartburn, melena, nausea and vomiting.  Genitourinary:  Negative for dysuria, frequency and urgency.  Musculoskeletal:  Positive for back pain and joint pain.  Skin: Negative.   Negative for itching and rash.  Neurological:  Negative for dizziness, tingling, focal weakness, weakness and headaches.  Endo/Heme/Allergies:  Does not bruise/bleed easily.  Psychiatric/Behavioral:  Negative for depression. The patient is not nervous/anxious and does not have insomnia.      MEDICAL HISTORY:  Past Medical History:  Diagnosis Date   Arthritis    knees   Cancer (HCC) 04/2017   right breast   Diabetes mellitus without complication (HCC)    Diabetic nephropathy (HCC)    Headache    migraines   Heart murmur    ASYMPTOMATIC   Hypertension    Hypothyroidism    Personal history of chemotherapy 2019   right breast cancer   Personal history of radiation therapy    Pneumonia    Seborrheic keratosis    Vitamin D deficiency     SURGICAL HISTORY: Past Surgical History:  Procedure Laterality Date   APPENDECTOMY     AXILLARY LYMPH NODE DISSECTION Right 02/01/2018   Procedure: AXILLARY LYMPH NODE DISSECTION;  Surgeon: Carolan Shiver, MD;  Location: [facility];  Service: General;  Laterality: Right;   BREAST BIOPSY Right 05/28/2017   right breast bx 3 areas invasive mamm ca lymph node mets   BREAST CYST ASPIRATION Bilateral    neg   BREAST LUMPECTOMY Right 02/01/2018   CATARACT EXTRACTION W/PHACO Right 07/05/2019   Procedure: CATARACT EXTRACTION PHACO AND INTRAOCULAR LENS PLACEMENT (IOC) RIGHT DIABETIC 10.78 00:55.5 ;  Surgeon: Galen Manila, MD;  Location: [facility];  Service: Ophthalmology;  Laterality: Right;  Diabetic - oral meds   CATARACT EXTRACTION W/PHACO Left 08/02/2019   Procedure: CATARACT EXTRACTION PHACO AND INTRAOCULAR LENS PLACEMENT (IOC) LEFT DIABETIC 6.73  00:40.2;  Surgeon: Galen Manila, MD;  Location:  [facility];  Service: Ophthalmology;  Laterality: Left;  Diabetic - oral meds   CHOLECYSTECTOMY     COLONOSCOPY WITH PROPOFOL N/A 01/01/2015   Procedure: COLONOSCOPY WITH PROPOFOL;  Surgeon: Elnita Maxwell, MD;  Location:  [facility]OSCOPY;  Service: Endoscopy;  Laterality: N/A;   EYE SURGERY Left    eyelid   PARTIAL MASTECTOMY WITH NEEDLE LOCALIZATION Right 02/01/2018   Procedure: PARTIAL MASTECTOMY WITH NEEDLE LOCALIZATION;  Surgeon: Carolan Shiver, MD;  Location: [facility];  Service: General;  Laterality: Right;   PORTACATH PLACEMENT Right 06/10/2017   Procedure: INSERTION PORT-A-CATH;  Surgeon: Carolan Shiver, MD;  Location: [facility];  Service: General;  Laterality: Right;   SENTINEL NODE BIOPSY Right 02/01/2018   Procedure: SENTINEL NODE BIOPSY;  Surgeon: Carolan Shiver, MD;  Location: [facility];  Service: General;  Laterality: Right;   STENT PLACEMENT ILIAC ([facility] HX)     temporary placement s/p cholecystectomy   TUBAL LIGATION      SOCIAL HISTORY: Social History   Socioeconomic History   Marital status: Married    Spouse name: Not on file   Number of children: Not on file   Years of education: Not on file   Highest education level: Not on file  Occupational History   Not on file  Tobacco Use   Smoking status: Former    Current packs/day: 0.00    Average packs/day: 2.0 packs/day for 10.0 years (20.0 ttl pk-yrs)    Types: Cigarettes    Start date: 04/21/1962    Quit date: 04/21/1972    Years since quitting: 50.9   Smokeless tobacco: Never  Vaping Use   Vaping status: Never Used  Substance and Sexual Activity   Alcohol use: No   Drug use: No   Sexual activity: Not on file  Other Topics Concern   Not on file  Social History Narrative   Not on file   Social Determinants of Health   Financial Resource Strain: Not on file  Food Insecurity: Not on file  Transportation Needs: Not on file  Physical Activity: Not on file  Stress: Not on file  Social Connections: Not on file  Intimate Partner Violence: Not on file    FAMILY HISTORY: Family History  Problem Relation Age of Onset   Parkinson's disease Mother    Cancer Maternal Aunt    Cancer Father    Lung cancer Father     Breast cancer Neg Hx     ALLERGIES:  is allergic to fiorinal [butalbital-aspirin-caffeine], sulfa antibiotics, tramadol, and other.  MEDICATIONS:  Current Outpatient Medications  Medication Sig Dispense Refill   acetaminophen (TYLENOL) 500 MG tablet Take 1,000 mg by mouth every 6 (six) hours as needed for moderate pain or headache.     atorvastatin (LIPITOR) 10 MG tablet Take 1 tablet by mouth daily.     Cholecalciferol 2000 UNITS CAPS Take 4,000 Units by mouth daily.     colesevelam (WELCHOL) 625 MG tablet Take by mouth.     CRANBERRY PO Take by mouth.     levothyroxine (SYNTHROID, LEVOTHROID) 50 MCG tablet Take 50 mcg by mouth daily before breakfast.     lisinopril (ZESTRIL) 5 MG tablet Take 5 mg by mouth daily.     metFORMIN (GLUCOPHAGE) 1000 MG tablet Take 1,000 mg by mouth 2 (two) times daily with a meal.     Multiple Vitamin (MULTIVITAMIN) tablet Take 1 tablet by mouth daily.     propranolol (INDERAL) 10 MG tablet Take 10 mg by  mouth 2 (two) times daily.     No current facility-administered medications for this visit.   Facility-Administered Medications Ordered in Other Visits  Medication Dose Route Frequency Provider Last Rate Last Admin   sodium chloride flush (NS) 0.9 % injection 10 mL  10 mL Intravenous PRN Rosey Bath, MD   10 mL at 10/21/17 0841      .  PHYSICAL EXAMINATION:  Vitals:   03/23/23 1300  BP: (!) 127/59  Pulse: 67  Temp: 97.6 F (36.4 C)  SpO2: 100%    Filed Weights   03/23/23 1300  Weight: 145 lb 12.8 oz (66.1 kg)     Physical Exam Vitals and nursing note reviewed.  HENT:     Head: Normocephalic and atraumatic.     Mouth/Throat:     Pharynx: Oropharynx is clear.  Eyes:     Extraocular Movements: Extraocular movements intact.     Pupils: Pupils are equal, round, and reactive to light.  Cardiovascular:     Rate and Rhythm: Normal rate and regular rhythm.  Pulmonary:     Comments: Decreased breath sounds bilaterally.   Abdominal:     Palpations: Abdomen is soft.  Musculoskeletal:        General: Normal range of motion.     Cervical back: Normal range of motion.  Skin:    General: Skin is warm.  Neurological:     General: No focal deficit present.     Mental Status: She is alert and oriented to person, place, and time.  Psychiatric:        Behavior: Behavior normal.        Judgment: Judgment normal.      LABORATORY DATA:  I have reviewed the data as listed Lab Results  Component Value Date   WBC 5.5 03/23/2023   HGB 12.9 03/23/2023   HCT 38.8 03/23/2023   MCV 94.6 03/23/2023   PLT 167 03/23/2023   Recent Labs    09/18/22 1325 03/23/23 1256  NA 136 137  K 4.2 4.2  CL 100 99  CO2 23 28  GLUCOSE 182* 104*  BUN 15 17  CREATININE 0.96 0.77  CALCIUM 9.4 9.2  GFRNONAA >60 >60  PROT 7.1 6.5  ALBUMIN 4.5 4.2  AST 25 18  ALT 25 18  ALKPHOS 46 44  BILITOT 1.2 1.0    RADIOGRAPHIC STUDIES: I have personally reviewed the radiological images as listed and agreed with the findings in the report. MM 3D SCREENING MAMMOGRAM BILATERAL BREAST  Result Date: 02/25/2023 CLINICAL DATA:  Screening. EXAM: DIGITAL SCREENING BILATERAL MAMMOGRAM WITH TOMOSYNTHESIS AND CAD TECHNIQUE: Bilateral screening digital craniocaudal and mediolateral oblique mammograms were obtained. Bilateral screening digital breast tomosynthesis was performed. The images were evaluated with computer-aided detection. COMPARISON:  Previous exam(s). ACR Breast Density Category c: The breasts are heterogeneously dense, which may obscure small masses. FINDINGS: There are no findings suspicious for malignancy. IMPRESSION: No mammographic evidence of malignancy. A result letter of this screening mammogram will be mailed directly to the patient. RECOMMENDATION: Screening mammogram in one year. (Code:SM-B-01Y) BI-RADS CATEGORY  1: Negative. Electronically Signed   By: Baird Lyons M.D.   On: 02/25/2023 14:39   DG Bone Density  Result  Date: 02/23/2023 EXAM: DUAL X-RAY ABSORPTIOMETRY (DXA) FOR BONE MINERAL DENSITY IMPRESSION: Your patient Marthella Tarbutton completed a BMD test on 02/23/2023 using the [employer] DXA System (software version: 14.10) manufactured by [employer]. The following summarizes the results of our evaluation. Technologist: SCE  PATIENT BIOGRAPHICAL: Name: Stephanie, Sanders Patient ID: [id number] Birth Date: 1945-12-05 Height: 66.5 in. Gender: Female Exam Date: 02/23/2023 Weight: 142.3 lbs. Indications: Advanced Age, Caucasian, Diabetic, History of Breast Cancer, Hypothyroid, Postmenopausal Fractures: Treatments: calcium w/ vit D, Levothyroxine, Metformin DENSITOMETRY RESULTS: Site      Region     Measured Date Measured Age WHO Classification Young Adult T-score BMD         %Change vs. Previous Significant Change (*) AP Spine L1-L2 2/23/2023 77.8 Normal -0.9 1.071 g/cm2 -1.5% - AP Spine L1-L2 02/04/2021 75.7 Normal -0.7 1.087 g/cm2 4.5% - AP Spine L1-L2 06/21/2012 67.1 Osteopenia -1.1 1.040 g/cm2 - - DualFemur Neck Right 02/23/2023 77.8 Normal -0.9 0.911 g/cm2 -3.6% - DualFemur Neck Right 02/04/2021 75.7 Normal -0.7 0.945 g/cm2 -4.5% - DualFemur Neck Right 06/21/2012 67.1 Normal -0.3 0.990 g/cm2 - - DualFemur Total Mean 02/23/2023 77.8 Normal -0.3 0.968 g/cm2 -2.6% Yes DualFemur Total Mean 02/04/2021 75.7 Normal -0.1 0.994 g/cm2 -5.6% Yes DualFemur Total Mean 06/21/2012 67.1 Normal 0.4 1.053 g/cm2 - - ASSESSMENT: The BMD measured at Femur Neck Right is 0.911 g/cm2 with a T-score of -0.9. This patient is considered normal according to World Health Organization [facility]) criteria. The scan quality is good. L3/L4 was excluded due to degenerative changes. Compared with prior study, there has been no significant change in the spine. Compared with prior study, there has been a significant decrease in the total hip. World Science writer [facility]) criteria for post-menopausal, Caucasian Women: Normal:                   T-score at  or above -1 SD Osteopenia/low bone mass: T-score between -1 and -2.5 SD Osteoporosis:             T-score at or below -2.5 SD RECOMMENDATIONS: 1. All patients should optimize calcium and vitamin D intake. 2. Consider FDA-approved medical therapies in postmenopausal women and men aged 95 years and older, based on the following: a. A hip or vertebral(clinical or morphometric) fracture b. T-score < -2.5 at the femoral neck or spine after appropriate evaluation to exclude secondary causes c. Low bone mass (T-score between -1.0 and -2.5 at the femoral neck or spine) and a 10-year probability of a hip fracture > 3% or a 10-year probability of a major osteoporosis-related fracture > 20% based on the [place]-adapted WHO algorithm 3. Clinician judgment and/or patient preferences may indicate treatment for people with 10-year fracture probabilities above or below these levels FOLLOW-UP: People with diagnosed cases of osteoporosis or at high risk for fracture should have regular bone mineral density tests. For patients eligible for Medicare, routine testing is allowed once every 2 years. The testing frequency can be increased to one year for patients who have rapidly progressing disease, those who are receiving or discontinuing medical therapy to restore bone mass, or have additional risk factors. I have reviewed this report, and agree with the above findings. [facility] Electronically Signed   By: Baird Lyons M.D.   On: 02/23/2023 11:38    Carcinoma of overlapping sites of right breast in female, estrogen receptor negative (HCC) 1.   Multifocal triple negative RIGHT breast cancer- s/p 4 cycles of neoadjuvant AC and 12 week of taxol completed 01/05/2018 followed by right partial mastectomy and sentinel lymph node biopsy [Dr.Cintron] 02/01/2018. S/p 8 cycles of xeloda 12/29/2018. Last mammogram 02/02/20 was negative. Bilateral breast MRI 08/03/20 was negative.   #Clinically asymptomatic.  No evidence of recurrence.   Mammogram NOV 2024-  WNL; stable.    #Bone density screening: October 2022 T score -1.7; NOv 2024- the BMD measured at Femur Neck Right is 0.911 g/cm2 with a T-score of -0.9- IMPRPOVED:  recommend additional calcium along with vitamin D For now we will continue conservative measures- stable.   #Since patient is clinically stable I think is reasonable for the patient to follow-up with PCP/can follow-up with [place] as needed.  Patient comfortable with the plan; to call us if any questions or concerns in the interim. Would not recommend tumor markers.   # DISPOSITION: # follow up  as needed- Dr.B    All questions were answered. The patient knows to call the clinic with any problems, questions or concerns.     Earna Coder, MD 03/23/2023 1:44 PM

## 2023-03-23 NOTE — Progress Notes (Signed)
C/o back pain 6/10, spouse is bed ridden, she has to care for him. Takes tylenol and ibuprofen.   Had a bone scan and mammogram since last visit.

## 2023-03-23 NOTE — Assessment & Plan Note (Addendum)
1.   Multifocal triple negative RIGHT breast cancer- s/p 4 cycles of neoadjuvant AC and 12 week of taxol completed 01/05/2018 followed by right partial mastectomy and sentinel lymph node biopsy [Dr.Cintron] 02/01/2018. S/p 8 cycles of xeloda 12/29/2018. Last mammogram 02/02/20 was negative. Bilateral breast MRI 08/03/20 was negative.   #Clinically asymptomatic.  No evidence of recurrence.  Mammogram NOV 2024- WNL; stable.    #Bone density screening: October 2022 T score -1.7; NOv 2024- the BMD measured at Femur Neck Right is 0.911 g/cm2 with a T-score of -0.9- IMPRPOVED:  recommend additional calcium along with vitamin D For now we will continue conservative measures- stable.   #Since patient is clinically stable I think is reasonable for the patient to follow-up with PCP/can follow-up with [place] as needed.  Patient comfortable with the plan; to call us if any questions or concerns in the interim. Would not recommend tumor markers.   # DISPOSITION: # follow up  as needed- Dr.B

## 2023-03-24 LAB — CANCER ANTIGEN 27.29: CA 27.29: 14.9 U/mL (ref 0.0–38.6)

## 2023-06-29 ENCOUNTER — Other Ambulatory Visit: Payer: Self-pay | Admitting: Internal Medicine

## 2023-06-29 DIAGNOSIS — C50411 Malignant neoplasm of upper-outer quadrant of right female breast: Secondary | ICD-10-CM

## 2023-07-06 ENCOUNTER — Ambulatory Visit
Admission: RE | Admit: 2023-07-06 | Discharge: 2023-07-06 | Disposition: A | Discharge status: Home / Self Care | Source: Ambulatory Visit | Attending: Internal Medicine | Admitting: Internal Medicine

## 2023-07-06 DIAGNOSIS — C50411 Malignant neoplasm of upper-outer quadrant of right female breast: Secondary | ICD-10-CM | POA: Diagnosis present

## 2023-07-06 DIAGNOSIS — Z171 Estrogen receptor negative status [ER-]: Secondary | ICD-10-CM | POA: Diagnosis present

## 2023-07-06 MED ORDER — GADOBUTROL 1 MMOL/ML IV SOLN
6.0000 mL | Freq: Once | INTRAVENOUS | Status: AC | PRN
  Administered 2023-07-06: 6 mL via INTRAVENOUS

## 2023-08-13 ENCOUNTER — Telehealth: Payer: Self-pay

## 2023-08-13 NOTE — Telephone Encounter (Signed)
 [facility] [id number] Understanding and Predicting Breast Cancer Events after Treatment (UPBEAT)   Study Follow-Up - Year 6   The CRC called the patient at 2:20 PM regarding the 6-year follow-up for the above study. The patient reported doing well and denied any hospitalizations since the last follow-up call on July 29, 2022. She also denied experiencing any cardiac events, including myocardial infarction (MI), percutaneous coronary intervention (PCI), coronary artery bypass grafting (CABG), heart catheterization, cerebrovascular accident (CVA), or a diagnosis of heart failure since the last contact.  The patient confirmed receipt of the study email regarding the 6-year follow-up questionnaire but mentioned she had not had time to complete it. The CRC completed the KCCQ-12 questionnaire with the patient over the phone. The patient denied any symptoms, physical or social limitations, and any impact on quality of life related to heart failure.  The patient confirmed her email address remains unchanged. She was informed that the next follow-up call will be in approximately one year. The patient was thanked for her time and ongoing contribution to the study.  Patient denied having any questions at this time and was encouraged to contact the research team with any study-related questions in the future.  Tanasia Budzinski, Ph.D. Clinical Research Coordinator [phone] 08/13/2023 3:00 PM

## 2023-08-13 NOTE — Telephone Encounter (Signed)
 [facility] [id number] Understanding and Predicting Breast Cancer Events after Treatment (UPBEAT)   Study Follow Up Year 6  The CRC attempted to contact the patient today for her 6-year follow-up call on the above study. A voicemail message was left requesting a return call to complete the study questionnaires and follow-up. The CRC will make another attempt to contact the patient tomorrow.  Korynne Dols, Ph.D. Clinical Research Coordinator [phone] 08/13/2023 12:06 PM

## 2024-01-26 ENCOUNTER — Other Ambulatory Visit: Payer: Self-pay | Admitting: Internal Medicine

## 2024-01-26 DIAGNOSIS — Z1231 Encounter for screening mammogram for malignant neoplasm of breast: Secondary | ICD-10-CM

## 2024-02-25 ENCOUNTER — Ambulatory Visit
Admission: RE | Admit: 2024-02-25 | Discharge: 2024-02-25 | Disposition: A | Discharge status: Home / Self Care | Source: Ambulatory Visit | Attending: Internal Medicine | Admitting: Internal Medicine

## 2024-02-25 DIAGNOSIS — Z1231 Encounter for screening mammogram for malignant neoplasm of breast: Secondary | ICD-10-CM | POA: Diagnosis present

## 2024-05-03 NOTE — Telephone Encounter (Signed)
 Pt called and Cx her procedure for 05/16/24 CTL, pt said she does not need procedure at this time. Pt said she has a F/U appt coming up and she will see if she needs the procedure then. Cx pt procedure. Pt understood.

## 2024-05-16 ENCOUNTER — Ambulatory Visit: Admit: 2024-05-16
